# Patient Record
Sex: Male | Born: 1940 | Race: White | Hispanic: No | Marital: Married | Smoking: Former smoker
Health system: Southern US, Community
[De-identification: ages and names within clinical notes are randomized; demographics above are authoritative.]

## PROBLEM LIST (undated history)

## (undated) DIAGNOSIS — F329 Major depressive disorder, single episode, unspecified: Secondary | ICD-10-CM

## (undated) DIAGNOSIS — F419 Anxiety disorder, unspecified: Secondary | ICD-10-CM

## (undated) DIAGNOSIS — G309 Alzheimer's disease, unspecified: Secondary | ICD-10-CM

## (undated) DIAGNOSIS — C679 Malignant neoplasm of bladder, unspecified: Secondary | ICD-10-CM

## (undated) DIAGNOSIS — Z85048 Personal history of other malignant neoplasm of rectum, rectosigmoid junction, and anus: Secondary | ICD-10-CM

## (undated) DIAGNOSIS — K5190 Ulcerative colitis, unspecified, without complications: Secondary | ICD-10-CM

## (undated) DIAGNOSIS — T8859XA Other complications of anesthesia, initial encounter: Secondary | ICD-10-CM

## (undated) DIAGNOSIS — F0150 Vascular dementia without behavioral disturbance: Secondary | ICD-10-CM

## (undated) DIAGNOSIS — Z9289 Personal history of other medical treatment: Secondary | ICD-10-CM

## (undated) DIAGNOSIS — G4730 Sleep apnea, unspecified: Secondary | ICD-10-CM

## (undated) DIAGNOSIS — D649 Anemia, unspecified: Secondary | ICD-10-CM

## (undated) DIAGNOSIS — I5022 Chronic systolic (congestive) heart failure: Secondary | ICD-10-CM

## (undated) DIAGNOSIS — Z932 Ileostomy status: Secondary | ICD-10-CM

## (undated) DIAGNOSIS — N400 Enlarged prostate without lower urinary tract symptoms: Secondary | ICD-10-CM

## (undated) DIAGNOSIS — I480 Paroxysmal atrial fibrillation: Secondary | ICD-10-CM

## (undated) DIAGNOSIS — R296 Repeated falls: Secondary | ICD-10-CM

## (undated) DIAGNOSIS — N209 Urinary calculus, unspecified: Secondary | ICD-10-CM

## (undated) DIAGNOSIS — N183 Chronic kidney disease, stage 3 (moderate): Secondary | ICD-10-CM

## (undated) DIAGNOSIS — I2510 Atherosclerotic heart disease of native coronary artery without angina pectoris: Secondary | ICD-10-CM

## (undated) DIAGNOSIS — E1322 Other specified diabetes mellitus with diabetic chronic kidney disease: Secondary | ICD-10-CM

## (undated) DIAGNOSIS — I714 Abdominal aortic aneurysm, without rupture: Secondary | ICD-10-CM

## (undated) DIAGNOSIS — C189 Malignant neoplasm of colon, unspecified: Secondary | ICD-10-CM

## (undated) DIAGNOSIS — F0280 Dementia in other diseases classified elsewhere without behavioral disturbance: Secondary | ICD-10-CM

## (undated) DIAGNOSIS — K219 Gastro-esophageal reflux disease without esophagitis: Secondary | ICD-10-CM

## (undated) DIAGNOSIS — J42 Unspecified chronic bronchitis: Secondary | ICD-10-CM

## (undated) DIAGNOSIS — T4145XA Adverse effect of unspecified anesthetic, initial encounter: Secondary | ICD-10-CM

## (undated) DIAGNOSIS — Z8719 Personal history of other diseases of the digestive system: Secondary | ICD-10-CM

## (undated) DIAGNOSIS — K56609 Unspecified intestinal obstruction, unspecified as to partial versus complete obstruction: Secondary | ICD-10-CM

## (undated) DIAGNOSIS — E1365 Other specified diabetes mellitus with hyperglycemia: Secondary | ICD-10-CM

## (undated) DIAGNOSIS — E119 Type 2 diabetes mellitus without complications: Secondary | ICD-10-CM

## (undated) DIAGNOSIS — I639 Cerebral infarction, unspecified: Secondary | ICD-10-CM

## (undated) DIAGNOSIS — F1011 Alcohol abuse, in remission: Secondary | ICD-10-CM

## (undated) DIAGNOSIS — I10 Essential (primary) hypertension: Secondary | ICD-10-CM

## (undated) DIAGNOSIS — E669 Obesity, unspecified: Secondary | ICD-10-CM

## (undated) DIAGNOSIS — M1990 Unspecified osteoarthritis, unspecified site: Secondary | ICD-10-CM

## (undated) DIAGNOSIS — F32A Depression, unspecified: Secondary | ICD-10-CM

## (undated) DIAGNOSIS — J45909 Unspecified asthma, uncomplicated: Secondary | ICD-10-CM

## (undated) DIAGNOSIS — E7800 Pure hypercholesterolemia, unspecified: Secondary | ICD-10-CM

## (undated) HISTORY — DX: Essential (primary) hypertension: I10

## (undated) HISTORY — DX: Benign prostatic hyperplasia without lower urinary tract symptoms: N40.0

## (undated) HISTORY — PX: TRANSURETHRAL RESECTION OF BLADDER TUMOR WITH GYRUS (TURBT-GYRUS): SHX6458

## (undated) HISTORY — DX: Depression, unspecified: F32.A

## (undated) HISTORY — PX: JOINT REPLACEMENT: SHX530

## (undated) HISTORY — DX: Major depressive disorder, single episode, unspecified: F32.9

## (undated) HISTORY — PX: APPENDECTOMY: SHX54

## (undated) HISTORY — DX: Paroxysmal atrial fibrillation: I48.0

## (undated) HISTORY — DX: Ulcerative colitis, unspecified, without complications: K51.90

## (undated) HISTORY — DX: Personal history of other malignant neoplasm of rectum, rectosigmoid junction, and anus: Z85.048

## (undated) HISTORY — PX: FRACTURE SURGERY: SHX138

## (undated) HISTORY — DX: Obesity, unspecified: E66.9

## (undated) HISTORY — PX: EYE SURGERY: SHX253

## (undated) HISTORY — DX: Atherosclerotic heart disease of native coronary artery without angina pectoris: I25.10

## (undated) HISTORY — DX: Pure hypercholesterolemia, unspecified: E78.00

## (undated) HISTORY — PX: COLONOSCOPY W/ POLYPECTOMY: SHX1380

## (undated) HISTORY — DX: Anemia, unspecified: D64.9

## (undated) HISTORY — PX: CATARACT EXTRACTION W/ INTRAOCULAR LENS  IMPLANT, BILATERAL: SHX1307

---

## 1961-09-03 HISTORY — PX: CYSTOSCOPY W/ STONE MANIPULATION: SHX1427

## 1970-04-05 DIAGNOSIS — I639 Cerebral infarction, unspecified: Secondary | ICD-10-CM

## 1970-04-05 HISTORY — DX: Cerebral infarction, unspecified: I63.9

## 1990-04-05 DIAGNOSIS — I714 Abdominal aortic aneurysm, without rupture, unspecified: Secondary | ICD-10-CM

## 1990-04-05 HISTORY — DX: Abdominal aortic aneurysm, without rupture: I71.4

## 1990-04-05 HISTORY — DX: Abdominal aortic aneurysm, without rupture, unspecified: I71.40

## 1990-12-05 HISTORY — PX: CORONARY ANGIOPLASTY: SHX604

## 1991-01-03 HISTORY — PX: ABDOMINAL AORTIC ANEURYSM REPAIR: SHX42

## 1999-07-29 HISTORY — PX: KNEE ARTHROSCOPY: SHX127

## 2000-01-11 ENCOUNTER — Encounter: Payer: Self-pay | Admitting: Urology

## 2000-01-12 ENCOUNTER — Encounter (INDEPENDENT_AMBULATORY_CARE_PROVIDER_SITE_OTHER): Payer: Self-pay | Admitting: Specialist

## 2000-01-12 ENCOUNTER — Encounter: Payer: Self-pay | Admitting: Urology

## 2000-01-12 ENCOUNTER — Observation Stay (HOSPITAL_COMMUNITY): Admission: RE | Admit: 2000-01-12 | Discharge: 2000-01-13 | Payer: Self-pay | Admitting: Urology

## 2000-05-06 DIAGNOSIS — C679 Malignant neoplasm of bladder, unspecified: Secondary | ICD-10-CM

## 2000-05-06 HISTORY — DX: Malignant neoplasm of bladder, unspecified: C67.9

## 2003-03-04 ENCOUNTER — Ambulatory Visit (HOSPITAL_COMMUNITY): Admission: RE | Admit: 2003-03-04 | Discharge: 2003-03-04 | Payer: Self-pay | Admitting: Oral Surgery

## 2003-08-17 ENCOUNTER — Inpatient Hospital Stay (HOSPITAL_COMMUNITY): Admission: EM | Admit: 2003-08-17 | Discharge: 2003-08-23 | Payer: Self-pay | Admitting: Emergency Medicine

## 2003-08-17 HISTORY — PX: ANKLE FRACTURE SURGERY: SHX122

## 2005-04-05 DIAGNOSIS — C189 Malignant neoplasm of colon, unspecified: Secondary | ICD-10-CM

## 2005-04-05 DIAGNOSIS — Z9289 Personal history of other medical treatment: Secondary | ICD-10-CM

## 2005-04-05 HISTORY — DX: Malignant neoplasm of colon, unspecified: C18.9

## 2005-04-05 HISTORY — DX: Personal history of other medical treatment: Z92.89

## 2005-09-20 ENCOUNTER — Ambulatory Visit: Payer: Self-pay | Admitting: Gastroenterology

## 2005-09-21 ENCOUNTER — Ambulatory Visit: Payer: Self-pay | Admitting: Gastroenterology

## 2005-09-21 ENCOUNTER — Encounter (INDEPENDENT_AMBULATORY_CARE_PROVIDER_SITE_OTHER): Payer: Self-pay | Admitting: Specialist

## 2005-09-22 ENCOUNTER — Ambulatory Visit (HOSPITAL_COMMUNITY): Admission: RE | Admit: 2005-09-22 | Discharge: 2005-09-22 | Payer: Self-pay | Admitting: Gastroenterology

## 2005-09-22 ENCOUNTER — Ambulatory Visit: Payer: Self-pay | Admitting: Oncology

## 2005-09-23 ENCOUNTER — Ambulatory Visit: Payer: Self-pay | Admitting: Cardiology

## 2005-09-27 ENCOUNTER — Ambulatory Visit: Payer: Self-pay | Admitting: Gastroenterology

## 2005-10-08 ENCOUNTER — Ambulatory Visit: Admission: RE | Admit: 2005-10-08 | Discharge: 2005-12-12 | Payer: Self-pay | Admitting: *Deleted

## 2005-10-08 LAB — COMPREHENSIVE METABOLIC PANEL
ALT: 27 U/L (ref 0–40)
CO2: 27 mEq/L (ref 19–32)
Calcium: 9.7 mg/dL (ref 8.4–10.5)
Chloride: 94 mEq/L — ABNORMAL LOW (ref 96–112)
Sodium: 136 mEq/L (ref 135–145)
Total Protein: 7.3 g/dL (ref 6.0–8.3)

## 2005-10-08 LAB — CBC WITH DIFFERENTIAL/PLATELET
BASO%: 0.2 % (ref 0.0–2.0)
MCHC: 34.8 g/dL (ref 32.0–35.9)
MONO#: 0.8 10*3/uL (ref 0.1–0.9)
RBC: 3.93 10*6/uL — ABNORMAL LOW (ref 4.20–5.71)
RDW: 13.2 % (ref 11.2–14.6)
WBC: 10.2 10*3/uL — ABNORMAL HIGH (ref 4.0–10.0)
lymph#: 1.1 10*3/uL (ref 0.9–3.3)

## 2005-10-08 LAB — CEA: CEA: 1.6 ng/mL (ref 0.0–5.0)

## 2005-10-19 ENCOUNTER — Ambulatory Visit: Payer: Self-pay | Admitting: Gastroenterology

## 2005-10-20 ENCOUNTER — Ambulatory Visit (HOSPITAL_COMMUNITY): Admission: RE | Admit: 2005-10-20 | Discharge: 2005-10-20 | Payer: Self-pay | Admitting: Gastroenterology

## 2005-10-26 LAB — CBC WITH DIFFERENTIAL/PLATELET
BASO%: 0.2 % (ref 0.0–2.0)
EOS%: 0.5 % (ref 0.0–7.0)
HCT: 38.2 % — ABNORMAL LOW (ref 38.7–49.9)
MCH: 33.5 pg — ABNORMAL HIGH (ref 28.0–33.4)
MCHC: 34.8 g/dL (ref 32.0–35.9)
MONO%: 4.8 % (ref 0.0–13.0)
NEUT%: 86.3 % — ABNORMAL HIGH (ref 40.0–75.0)
RDW: 12.5 % (ref 11.2–14.6)
lymph#: 0.9 10*3/uL (ref 0.9–3.3)

## 2005-10-26 LAB — COMPREHENSIVE METABOLIC PANEL
ALT: 23 U/L (ref 0–40)
AST: 17 U/L (ref 0–37)
Alkaline Phosphatase: 60 U/L (ref 39–117)
Calcium: 10 mg/dL (ref 8.4–10.5)
Chloride: 98 mEq/L (ref 96–112)
Creatinine, Ser: 1.3 mg/dL (ref 0.40–1.50)
Total Bilirubin: 0.8 mg/dL (ref 0.3–1.2)

## 2005-11-02 ENCOUNTER — Ambulatory Visit: Payer: Self-pay | Admitting: Gastroenterology

## 2005-11-02 LAB — COMPREHENSIVE METABOLIC PANEL
ALT: 18 U/L (ref 0–40)
AST: 14 U/L (ref 0–37)
BUN: 19 mg/dL (ref 6–23)
Creatinine, Ser: 1.12 mg/dL (ref 0.40–1.50)
Total Bilirubin: 0.9 mg/dL (ref 0.3–1.2)

## 2005-11-02 LAB — CBC WITH DIFFERENTIAL/PLATELET
BASO%: 0.3 % (ref 0.0–2.0)
Basophils Absolute: 0 10*3/uL (ref 0.0–0.1)
EOS%: 1.1 % (ref 0.0–7.0)
HCT: 31 % — ABNORMAL LOW (ref 38.7–49.9)
LYMPH%: 20.4 % (ref 14.0–48.0)
MCH: 33.6 pg — ABNORMAL HIGH (ref 28.0–33.4)
MCHC: 35.2 g/dL (ref 32.0–35.9)
MCV: 95.3 fL (ref 81.6–98.0)
MONO%: 8.1 % (ref 0.0–13.0)
NEUT%: 70.1 % (ref 40.0–75.0)
Platelets: 242 10*3/uL (ref 145–400)

## 2005-11-09 ENCOUNTER — Ambulatory Visit: Payer: Self-pay | Admitting: Oncology

## 2005-11-09 LAB — CBC WITH DIFFERENTIAL/PLATELET
Basophils Absolute: 0 10*3/uL (ref 0.0–0.1)
EOS%: 3 % (ref 0.0–7.0)
HCT: 31.9 % — ABNORMAL LOW (ref 38.7–49.9)
HGB: 11.1 g/dL — ABNORMAL LOW (ref 13.0–17.1)
MCH: 33.8 pg — ABNORMAL HIGH (ref 28.0–33.4)
MONO#: 0.5 10*3/uL (ref 0.1–0.9)
NEUT%: 65.2 % (ref 40.0–75.0)
Platelets: 229 10*3/uL (ref 145–400)
lymph#: 1 10*3/uL (ref 0.9–3.3)

## 2005-11-16 LAB — CBC WITH DIFFERENTIAL/PLATELET
BASO%: 0.5 % (ref 0.0–2.0)
Eosinophils Absolute: 0.2 10*3/uL (ref 0.0–0.5)
HCT: 29.7 % — ABNORMAL LOW (ref 38.7–49.9)
HGB: 10.4 g/dL — ABNORMAL LOW (ref 13.0–17.1)
LYMPH%: 14.9 % (ref 14.0–48.0)
MCH: 34 pg — ABNORMAL HIGH (ref 28.0–33.4)
MCV: 97.2 fL (ref 81.6–98.0)
MONO#: 0.6 10*3/uL (ref 0.1–0.9)
lymph#: 0.8 10*3/uL — ABNORMAL LOW (ref 0.9–3.3)

## 2005-11-18 LAB — CBC WITH DIFFERENTIAL/PLATELET
BASO%: 0.4 % (ref 0.0–2.0)
Eosinophils Absolute: 0.2 10*3/uL (ref 0.0–0.5)
HCT: 29.4 % — ABNORMAL LOW (ref 38.7–49.9)
HGB: 10.4 g/dL — ABNORMAL LOW (ref 13.0–17.1)
LYMPH%: 15.9 % (ref 14.0–48.0)
MCHC: 35.2 g/dL (ref 32.0–35.9)
MONO#: 0.5 10*3/uL (ref 0.1–0.9)
NEUT#: 2.9 10*3/uL (ref 1.5–6.5)
NEUT%: 67.7 % (ref 40.0–75.0)
Platelets: 242 10*3/uL (ref 145–400)
WBC: 4.3 10*3/uL (ref 4.0–10.0)
lymph#: 0.7 10*3/uL — ABNORMAL LOW (ref 0.9–3.3)

## 2005-11-18 LAB — COMPREHENSIVE METABOLIC PANEL
ALT: 17 U/L (ref 0–40)
CO2: 30 mEq/L (ref 19–32)
Calcium: 10.1 mg/dL (ref 8.4–10.5)
Chloride: 98 mEq/L (ref 96–112)
Creatinine, Ser: 1.15 mg/dL (ref 0.40–1.50)
Glucose, Bld: 224 mg/dL — ABNORMAL HIGH (ref 70–99)
Total Bilirubin: 0.8 mg/dL (ref 0.3–1.2)
Total Protein: 6.9 g/dL (ref 6.0–8.3)

## 2005-11-23 LAB — COMPREHENSIVE METABOLIC PANEL
ALT: 15 U/L (ref 0–40)
Albumin: 4.4 g/dL (ref 3.5–5.2)
CO2: 27 mEq/L (ref 19–32)
Calcium: 9.8 mg/dL (ref 8.4–10.5)
Chloride: 99 mEq/L (ref 96–112)
Glucose, Bld: 240 mg/dL — ABNORMAL HIGH (ref 70–99)
Sodium: 136 mEq/L (ref 135–145)
Total Protein: 6.7 g/dL (ref 6.0–8.3)

## 2005-11-23 LAB — CBC WITH DIFFERENTIAL/PLATELET
BASO%: 0.3 % (ref 0.0–2.0)
Eosinophils Absolute: 0.2 10*3/uL (ref 0.0–0.5)
HCT: 28.5 % — ABNORMAL LOW (ref 38.7–49.9)
MCHC: 35.4 g/dL (ref 32.0–35.9)
MONO#: 0.4 10*3/uL (ref 0.1–0.9)
NEUT#: 2.8 10*3/uL (ref 1.5–6.5)
RBC: 2.87 10*6/uL — ABNORMAL LOW (ref 4.20–5.71)
WBC: 4 10*3/uL (ref 4.0–10.0)
lymph#: 0.6 10*3/uL — ABNORMAL LOW (ref 0.9–3.3)

## 2005-11-30 LAB — CBC WITH DIFFERENTIAL/PLATELET
BASO%: 0.3 % (ref 0.0–2.0)
LYMPH%: 14.1 % (ref 14.0–48.0)
MCHC: 34.6 g/dL (ref 32.0–35.9)
MONO#: 0.5 10*3/uL (ref 0.1–0.9)
RBC: 2.83 10*6/uL — ABNORMAL LOW (ref 4.20–5.71)
RDW: 20.4 % — ABNORMAL HIGH (ref 11.2–14.6)
WBC: 4.1 10*3/uL (ref 4.0–10.0)
lymph#: 0.6 10*3/uL — ABNORMAL LOW (ref 0.9–3.3)

## 2005-12-03 ENCOUNTER — Ambulatory Visit: Payer: Self-pay | Admitting: Gastroenterology

## 2005-12-22 ENCOUNTER — Ambulatory Visit: Payer: Self-pay | Admitting: Oncology

## 2005-12-22 LAB — CBC WITH DIFFERENTIAL/PLATELET
BASO%: 0.7 % (ref 0.0–2.0)
HCT: 33.5 % — ABNORMAL LOW (ref 38.7–49.9)
MCHC: 34.8 g/dL (ref 32.0–35.9)
MONO#: 0.5 10*3/uL (ref 0.1–0.9)
NEUT#: 2.7 10*3/uL (ref 1.5–6.5)
NEUT%: 60.3 % (ref 40.0–75.0)
WBC: 4.4 10*3/uL (ref 4.0–10.0)
lymph#: 0.9 10*3/uL (ref 0.9–3.3)

## 2005-12-22 LAB — COMPREHENSIVE METABOLIC PANEL
ALT: 17 U/L (ref 0–40)
CO2: 30 mEq/L (ref 19–32)
Calcium: 9.9 mg/dL (ref 8.4–10.5)
Chloride: 99 mEq/L (ref 96–112)
Creatinine, Ser: 0.97 mg/dL (ref 0.40–1.50)
Sodium: 138 mEq/L (ref 135–145)
Total Protein: 6.9 g/dL (ref 6.0–8.3)

## 2006-01-05 ENCOUNTER — Inpatient Hospital Stay (HOSPITAL_COMMUNITY): Admission: RE | Admit: 2006-01-05 | Discharge: 2006-01-31 | Payer: Self-pay | Admitting: General Surgery

## 2006-01-06 ENCOUNTER — Encounter (INDEPENDENT_AMBULATORY_CARE_PROVIDER_SITE_OTHER): Payer: Self-pay | Admitting: Specialist

## 2006-01-06 DIAGNOSIS — Z932 Ileostomy status: Secondary | ICD-10-CM

## 2006-01-06 HISTORY — PX: COLECTOMY: SHX59

## 2006-01-06 HISTORY — DX: Ileostomy status: Z93.2

## 2006-02-07 ENCOUNTER — Inpatient Hospital Stay (HOSPITAL_COMMUNITY): Admission: AD | Admit: 2006-02-07 | Discharge: 2006-03-02 | Payer: Self-pay | Admitting: General Surgery

## 2006-02-08 ENCOUNTER — Ambulatory Visit: Payer: Self-pay | Admitting: Oncology

## 2006-02-11 ENCOUNTER — Ambulatory Visit: Payer: Self-pay | Admitting: Physical Medicine & Rehabilitation

## 2006-02-11 ENCOUNTER — Encounter: Payer: Self-pay | Admitting: Cardiology

## 2006-02-11 ENCOUNTER — Ambulatory Visit: Payer: Self-pay | Admitting: Cardiology

## 2006-02-14 ENCOUNTER — Encounter (INDEPENDENT_AMBULATORY_CARE_PROVIDER_SITE_OTHER): Payer: Self-pay | Admitting: Neurology

## 2006-02-22 ENCOUNTER — Ambulatory Visit: Payer: Self-pay | Admitting: Infectious Diseases

## 2006-03-11 ENCOUNTER — Inpatient Hospital Stay (HOSPITAL_COMMUNITY): Admission: EM | Admit: 2006-03-11 | Discharge: 2006-03-25 | Payer: Self-pay | Admitting: Emergency Medicine

## 2006-04-07 ENCOUNTER — Encounter: Admission: RE | Admit: 2006-04-07 | Discharge: 2006-04-07 | Payer: Self-pay | Admitting: General Surgery

## 2006-04-22 ENCOUNTER — Encounter (INDEPENDENT_AMBULATORY_CARE_PROVIDER_SITE_OTHER): Payer: Self-pay | Admitting: Specialist

## 2006-04-22 ENCOUNTER — Inpatient Hospital Stay (HOSPITAL_COMMUNITY): Admission: RE | Admit: 2006-04-22 | Discharge: 2006-04-24 | Payer: Self-pay | Admitting: Urology

## 2006-04-22 HISTORY — PX: TRANSURETHRAL RESECTION OF PROSTATE: SHX73

## 2006-05-02 ENCOUNTER — Ambulatory Visit: Payer: Self-pay | Admitting: Infectious Diseases

## 2006-05-25 DIAGNOSIS — K5190 Ulcerative colitis, unspecified, without complications: Secondary | ICD-10-CM | POA: Insufficient documentation

## 2006-05-25 DIAGNOSIS — Z85048 Personal history of other malignant neoplasm of rectum, rectosigmoid junction, and anus: Secondary | ICD-10-CM

## 2006-05-25 DIAGNOSIS — N400 Enlarged prostate without lower urinary tract symptoms: Secondary | ICD-10-CM | POA: Insufficient documentation

## 2006-05-25 DIAGNOSIS — R32 Unspecified urinary incontinence: Secondary | ICD-10-CM | POA: Insufficient documentation

## 2006-06-01 ENCOUNTER — Encounter (INDEPENDENT_AMBULATORY_CARE_PROVIDER_SITE_OTHER): Payer: Self-pay | Admitting: Infectious Diseases

## 2006-06-03 ENCOUNTER — Ambulatory Visit: Payer: Self-pay | Admitting: Oncology

## 2006-06-08 LAB — COMPREHENSIVE METABOLIC PANEL
ALT: 17 U/L (ref 0–53)
BUN: 21 mg/dL (ref 6–23)
CO2: 24 mEq/L (ref 19–32)
Calcium: 10.4 mg/dL (ref 8.4–10.5)
Chloride: 106 mEq/L (ref 96–112)
Creatinine, Ser: 1.06 mg/dL (ref 0.40–1.50)
Total Bilirubin: 0.4 mg/dL (ref 0.3–1.2)

## 2006-06-08 LAB — CBC WITH DIFFERENTIAL/PLATELET
BASO%: 0.6 % (ref 0.0–2.0)
Basophils Absolute: 0 10*3/uL (ref 0.0–0.1)
EOS%: 8.7 % — ABNORMAL HIGH (ref 0.0–7.0)
HCT: 34.5 % — ABNORMAL LOW (ref 38.7–49.9)
HGB: 12.1 g/dL — ABNORMAL LOW (ref 13.0–17.1)
LYMPH%: 21.6 % (ref 14.0–48.0)
MCH: 30.4 pg (ref 28.0–33.4)
MCHC: 35.2 g/dL (ref 32.0–35.9)
MONO#: 0.5 10*3/uL (ref 0.1–0.9)
NEUT%: 61.5 % (ref 40.0–75.0)
Platelets: 244 10*3/uL (ref 145–400)
lymph#: 1.3 10*3/uL (ref 0.9–3.3)

## 2006-06-13 ENCOUNTER — Ambulatory Visit (HOSPITAL_COMMUNITY): Admission: RE | Admit: 2006-06-13 | Discharge: 2006-06-13 | Payer: Self-pay | Admitting: Oncology

## 2006-09-05 ENCOUNTER — Ambulatory Visit: Payer: Self-pay | Admitting: Oncology

## 2006-09-07 LAB — CBC WITH DIFFERENTIAL/PLATELET
BASO%: 0.5 % (ref 0.0–2.0)
Basophils Absolute: 0 10*3/uL (ref 0.0–0.1)
Eosinophils Absolute: 0.5 10*3/uL (ref 0.0–0.5)
HCT: 35 % — ABNORMAL LOW (ref 38.7–49.9)
HGB: 12.5 g/dL — ABNORMAL LOW (ref 13.0–17.1)
LYMPH%: 29.5 % (ref 14.0–48.0)
MONO#: 0.4 10*3/uL (ref 0.1–0.9)
NEUT%: 53 % (ref 40.0–75.0)
Platelets: 219 10*3/uL (ref 145–400)
WBC: 5.5 10*3/uL (ref 4.0–10.0)
lymph#: 1.6 10*3/uL (ref 0.9–3.3)

## 2006-09-07 LAB — COMPREHENSIVE METABOLIC PANEL
ALT: 22 U/L (ref 0–53)
BUN: 17 mg/dL (ref 6–23)
CO2: 25 mEq/L (ref 19–32)
Calcium: 10.2 mg/dL (ref 8.4–10.5)
Chloride: 103 mEq/L (ref 96–112)
Creatinine, Ser: 1.31 mg/dL (ref 0.40–1.50)
Glucose, Bld: 173 mg/dL — ABNORMAL HIGH (ref 70–99)
Total Bilirubin: 0.6 mg/dL (ref 0.3–1.2)

## 2006-09-07 LAB — CEA: CEA: 0.7 ng/mL (ref 0.0–5.0)

## 2006-11-23 ENCOUNTER — Ambulatory Visit: Payer: Self-pay | Admitting: Gastroenterology

## 2006-12-08 ENCOUNTER — Ambulatory Visit (HOSPITAL_COMMUNITY): Admission: RE | Admit: 2006-12-08 | Discharge: 2006-12-08 | Payer: Self-pay | Admitting: Oncology

## 2006-12-13 ENCOUNTER — Ambulatory Visit: Payer: Self-pay | Admitting: Oncology

## 2006-12-15 LAB — CBC WITH DIFFERENTIAL/PLATELET
BASO%: 0.5 % (ref 0.0–2.0)
Basophils Absolute: 0 10*3/uL (ref 0.0–0.1)
EOS%: 5.3 % (ref 0.0–7.0)
HGB: 12.7 g/dL — ABNORMAL LOW (ref 13.0–17.1)
MCH: 33.1 pg (ref 28.0–33.4)
MONO#: 0.4 10*3/uL (ref 0.1–0.9)
RDW: 14.9 % — ABNORMAL HIGH (ref 11.2–14.6)
WBC: 5.6 10*3/uL (ref 4.0–10.0)
lymph#: 1.2 10*3/uL (ref 0.9–3.3)

## 2006-12-15 LAB — COMPREHENSIVE METABOLIC PANEL
ALT: 21 U/L (ref 0–53)
AST: 20 U/L (ref 0–37)
Albumin: 4.8 g/dL (ref 3.5–5.2)
BUN: 18 mg/dL (ref 6–23)
CO2: 23 mEq/L (ref 19–32)
Calcium: 10 mg/dL (ref 8.4–10.5)
Chloride: 105 mEq/L (ref 96–112)
Potassium: 5 mEq/L (ref 3.5–5.3)

## 2006-12-15 LAB — CEA: CEA: 1.3 ng/mL (ref 0.0–5.0)

## 2007-03-14 ENCOUNTER — Ambulatory Visit: Payer: Self-pay | Admitting: Oncology

## 2007-03-16 LAB — CBC WITH DIFFERENTIAL/PLATELET
BASO%: 0.2 % (ref 0.0–2.0)
EOS%: 4.7 % (ref 0.0–7.0)
MCH: 32.7 pg (ref 28.0–33.4)
MCV: 91.3 fL (ref 81.6–98.0)
MONO%: 6.1 % (ref 0.0–13.0)
RBC: 4.27 10*6/uL (ref 4.20–5.71)
RDW: 13.1 % (ref 11.2–14.6)

## 2007-03-16 LAB — COMPREHENSIVE METABOLIC PANEL
ALT: 26 U/L (ref 0–53)
AST: 22 U/L (ref 0–37)
BUN: 21 mg/dL (ref 6–23)
CO2: 21 mEq/L (ref 19–32)
Creatinine, Ser: 1.38 mg/dL (ref 0.40–1.50)
Total Bilirubin: 0.7 mg/dL (ref 0.3–1.2)

## 2007-03-16 LAB — CEA: CEA: 0.6 ng/mL (ref 0.0–5.0)

## 2007-04-22 ENCOUNTER — Encounter: Admission: RE | Admit: 2007-04-22 | Discharge: 2007-04-22 | Payer: Self-pay | Admitting: Family Medicine

## 2007-05-29 ENCOUNTER — Encounter (INDEPENDENT_AMBULATORY_CARE_PROVIDER_SITE_OTHER): Payer: Self-pay | Admitting: *Deleted

## 2007-05-29 ENCOUNTER — Ambulatory Visit: Payer: Self-pay

## 2007-06-14 ENCOUNTER — Ambulatory Visit: Payer: Self-pay | Admitting: Oncology

## 2007-06-14 LAB — CBC WITH DIFFERENTIAL/PLATELET
Basophils Absolute: 0 10*3/uL (ref 0.0–0.1)
EOS%: 6.4 % (ref 0.0–7.0)
Eosinophils Absolute: 0.3 10*3/uL (ref 0.0–0.5)
HCT: 35.2 % — ABNORMAL LOW (ref 38.7–49.9)
HGB: 12.6 g/dL — ABNORMAL LOW (ref 13.0–17.1)
MCH: 32.9 pg (ref 28.0–33.4)
MONO#: 0.3 10*3/uL (ref 0.1–0.9)
NEUT#: 3.2 10*3/uL (ref 1.5–6.5)
NEUT%: 59.7 % (ref 40.0–75.0)
lymph#: 1.5 10*3/uL (ref 0.9–3.3)

## 2007-06-14 LAB — COMPREHENSIVE METABOLIC PANEL
Albumin: 4.9 g/dL (ref 3.5–5.2)
BUN: 18 mg/dL (ref 6–23)
CO2: 23 mEq/L (ref 19–32)
Calcium: 9.7 mg/dL (ref 8.4–10.5)
Chloride: 101 mEq/L (ref 96–112)
Creatinine, Ser: 1.37 mg/dL (ref 0.40–1.50)
Glucose, Bld: 279 mg/dL — ABNORMAL HIGH (ref 70–99)

## 2007-06-14 LAB — CEA: CEA: 1.1 ng/mL (ref 0.0–5.0)

## 2007-06-19 ENCOUNTER — Ambulatory Visit (HOSPITAL_COMMUNITY): Admission: RE | Admit: 2007-06-19 | Discharge: 2007-06-19 | Payer: Self-pay | Admitting: Oncology

## 2007-08-11 ENCOUNTER — Encounter: Payer: Self-pay | Admitting: Internal Medicine

## 2007-08-11 ENCOUNTER — Ambulatory Visit: Payer: Self-pay | Admitting: Cardiology

## 2007-09-18 ENCOUNTER — Ambulatory Visit: Payer: Self-pay | Admitting: Cardiology

## 2007-09-20 ENCOUNTER — Ambulatory Visit: Payer: Self-pay | Admitting: Oncology

## 2007-09-22 LAB — COMPREHENSIVE METABOLIC PANEL
ALT: 25 U/L (ref 0–53)
CO2: 25 mEq/L (ref 19–32)
Creatinine, Ser: 1.65 mg/dL — ABNORMAL HIGH (ref 0.40–1.50)
Total Bilirubin: 0.8 mg/dL (ref 0.3–1.2)

## 2007-09-22 LAB — CEA: CEA: 1.2 ng/mL (ref 0.0–5.0)

## 2007-09-22 LAB — CBC WITH DIFFERENTIAL/PLATELET
BASO%: 0.3 % (ref 0.0–2.0)
EOS%: 5.7 % (ref 0.0–7.0)
HCT: 33 % — ABNORMAL LOW (ref 38.7–49.9)
LYMPH%: 23.7 % (ref 14.0–48.0)
MCH: 32.6 pg (ref 28.0–33.4)
MCHC: 35.3 g/dL (ref 32.0–35.9)
NEUT%: 63.2 % (ref 40.0–75.0)
Platelets: 239 10*3/uL (ref 145–400)

## 2007-10-23 ENCOUNTER — Ambulatory Visit: Payer: Self-pay | Admitting: Internal Medicine

## 2007-11-06 ENCOUNTER — Ambulatory Visit: Payer: Self-pay | Admitting: Internal Medicine

## 2007-11-06 ENCOUNTER — Ambulatory Visit (HOSPITAL_COMMUNITY): Admission: RE | Admit: 2007-11-06 | Discharge: 2007-11-06 | Payer: Self-pay | Admitting: Internal Medicine

## 2007-11-30 ENCOUNTER — Ambulatory Visit: Payer: Self-pay

## 2007-12-13 ENCOUNTER — Ambulatory Visit: Payer: Self-pay | Admitting: Oncology

## 2007-12-18 ENCOUNTER — Ambulatory Visit (HOSPITAL_COMMUNITY): Admission: RE | Admit: 2007-12-18 | Discharge: 2007-12-18 | Payer: Self-pay | Admitting: Oncology

## 2007-12-18 LAB — CBC WITH DIFFERENTIAL/PLATELET
Basophils Absolute: 0 10*3/uL (ref 0.0–0.1)
Eosinophils Absolute: 0.4 10*3/uL (ref 0.0–0.5)
HCT: 36 % — ABNORMAL LOW (ref 38.7–49.9)
HGB: 12.7 g/dL — ABNORMAL LOW (ref 13.0–17.1)
LYMPH%: 26.1 % (ref 14.0–48.0)
MCV: 92.8 fL (ref 81.6–98.0)
MONO%: 7.1 % (ref 0.0–13.0)
NEUT#: 3.1 10*3/uL (ref 1.5–6.5)
NEUT%: 59.2 % (ref 40.0–75.0)
Platelets: 213 10*3/uL (ref 145–400)

## 2007-12-18 LAB — COMPREHENSIVE METABOLIC PANEL
Albumin: 3.9 g/dL (ref 3.5–5.2)
Alkaline Phosphatase: 41 U/L (ref 39–117)
BUN: 16 mg/dL (ref 6–23)
Creatinine, Ser: 1.37 mg/dL (ref 0.40–1.50)
Glucose, Bld: 314 mg/dL — ABNORMAL HIGH (ref 70–99)
Potassium: 4.8 mEq/L (ref 3.5–5.3)
Total Bilirubin: 0.8 mg/dL (ref 0.3–1.2)

## 2007-12-18 LAB — CEA: CEA: 1.2 ng/mL (ref 0.0–5.0)

## 2007-12-19 ENCOUNTER — Ambulatory Visit: Payer: Self-pay | Admitting: Internal Medicine

## 2008-01-07 HISTORY — PX: TOTAL KNEE ARTHROPLASTY: SHX125

## 2008-01-12 ENCOUNTER — Ambulatory Visit: Payer: Self-pay

## 2008-02-26 ENCOUNTER — Ambulatory Visit: Payer: Self-pay | Admitting: Internal Medicine

## 2008-03-20 ENCOUNTER — Ambulatory Visit: Payer: Self-pay | Admitting: Oncology

## 2008-04-04 LAB — COMPREHENSIVE METABOLIC PANEL
ALT: 28 U/L (ref 0–53)
AST: 26 U/L (ref 0–37)
Creatinine, Ser: 2.41 mg/dL — ABNORMAL HIGH (ref 0.40–1.50)
Sodium: 129 mEq/L — ABNORMAL LOW (ref 135–145)
Total Bilirubin: 0.4 mg/dL (ref 0.3–1.2)
Total Protein: 7.1 g/dL (ref 6.0–8.3)

## 2008-04-04 LAB — CBC WITH DIFFERENTIAL/PLATELET
BASO%: 0 % (ref 0.0–2.0)
EOS%: 0 % (ref 0.0–7.0)
HCT: 34 % — ABNORMAL LOW (ref 38.7–49.9)
LYMPH%: 8.5 % — ABNORMAL LOW (ref 14.0–48.0)
MCH: 31.5 pg (ref 28.0–33.4)
MCHC: 34.6 g/dL (ref 32.0–35.9)
NEUT%: 88.6 % — ABNORMAL HIGH (ref 40.0–75.0)
Platelets: 460 10*3/uL — ABNORMAL HIGH (ref 145–400)
RBC: 3.75 10*6/uL — ABNORMAL LOW (ref 4.20–5.71)
lymph#: 1.3 10*3/uL (ref 0.9–3.3)

## 2008-04-05 ENCOUNTER — Inpatient Hospital Stay (HOSPITAL_COMMUNITY): Admission: EM | Admit: 2008-04-05 | Discharge: 2008-04-08 | Payer: Self-pay | Admitting: Emergency Medicine

## 2008-06-28 ENCOUNTER — Ambulatory Visit: Payer: Self-pay | Admitting: Oncology

## 2008-07-02 ENCOUNTER — Ambulatory Visit (HOSPITAL_COMMUNITY): Admission: RE | Admit: 2008-07-02 | Discharge: 2008-07-02 | Payer: Self-pay | Admitting: Oncology

## 2008-07-02 LAB — CBC WITH DIFFERENTIAL/PLATELET
Basophils Absolute: 0.1 10*3/uL (ref 0.0–0.1)
Eosinophils Absolute: 0.4 10*3/uL (ref 0.0–0.5)
HGB: 14.1 g/dL (ref 13.0–17.1)
MONO#: 0.5 10*3/uL (ref 0.1–0.9)
MONO%: 8.9 % (ref 0.0–14.0)
NEUT#: 3.1 10*3/uL (ref 1.5–6.5)
RBC: 4.7 10*6/uL (ref 4.20–5.82)
RDW: 13.9 % (ref 11.0–14.6)
WBC: 5.7 10*3/uL (ref 4.0–10.3)
lymph#: 1.8 10*3/uL (ref 0.9–3.3)
nRBC: 0 % (ref 0–0)

## 2008-07-02 LAB — COMPREHENSIVE METABOLIC PANEL
ALT: 24 U/L (ref 0–53)
Albumin: 4.9 g/dL (ref 3.5–5.2)
CO2: 26 mEq/L (ref 19–32)
Glucose, Bld: 157 mg/dL — ABNORMAL HIGH (ref 70–99)
Potassium: 5 mEq/L (ref 3.5–5.3)
Sodium: 137 mEq/L (ref 135–145)
Total Bilirubin: 0.5 mg/dL (ref 0.3–1.2)
Total Protein: 7.4 g/dL (ref 6.0–8.3)

## 2008-07-02 LAB — CEA: CEA: 0.6 ng/mL (ref 0.0–5.0)

## 2008-07-16 DIAGNOSIS — D649 Anemia, unspecified: Secondary | ICD-10-CM

## 2008-07-16 DIAGNOSIS — E7800 Pure hypercholesterolemia, unspecified: Secondary | ICD-10-CM

## 2008-07-16 DIAGNOSIS — F329 Major depressive disorder, single episode, unspecified: Secondary | ICD-10-CM

## 2008-07-16 DIAGNOSIS — I10 Essential (primary) hypertension: Secondary | ICD-10-CM

## 2008-07-16 DIAGNOSIS — M109 Gout, unspecified: Secondary | ICD-10-CM

## 2008-07-18 ENCOUNTER — Ambulatory Visit: Payer: Self-pay | Admitting: Internal Medicine

## 2008-07-18 ENCOUNTER — Encounter (INDEPENDENT_AMBULATORY_CARE_PROVIDER_SITE_OTHER): Payer: Self-pay | Admitting: *Deleted

## 2008-07-18 ENCOUNTER — Encounter: Payer: Self-pay | Admitting: Internal Medicine

## 2008-07-18 DIAGNOSIS — R55 Syncope and collapse: Secondary | ICD-10-CM

## 2008-07-18 LAB — CONVERTED CEMR LAB
BUN: 23 mg/dL (ref 6–23)
Basophils Absolute: 0.1 10*3/uL (ref 0.0–0.1)
CO2: 28 meq/L (ref 19–32)
Chloride: 101 meq/L (ref 96–112)
Creatinine, Ser: 1.5 mg/dL (ref 0.4–1.5)
Eosinophils Absolute: 0.4 10*3/uL (ref 0.0–0.7)
Eosinophils Relative: 7.1 % — ABNORMAL HIGH (ref 0.0–5.0)
Glucose, Bld: 279 mg/dL — ABNORMAL HIGH (ref 70–99)
HCT: 35.3 % — ABNORMAL LOW (ref 39.0–52.0)
Lymphs Abs: 1.5 10*3/uL (ref 0.7–4.0)
MCHC: 35.2 g/dL (ref 30.0–36.0)
MCV: 90.2 fL (ref 78.0–100.0)
Monocytes Absolute: 0.5 10*3/uL (ref 0.1–1.0)
Platelets: 228 10*3/uL (ref 150.0–400.0)
Potassium: 4.3 meq/L (ref 3.5–5.1)
Prothrombin Time: 10.7 s — ABNORMAL LOW (ref 10.9–13.3)
RDW: 12.8 % (ref 11.5–14.6)

## 2008-07-23 ENCOUNTER — Ambulatory Visit: Payer: Self-pay | Admitting: Internal Medicine

## 2008-07-23 ENCOUNTER — Ambulatory Visit (HOSPITAL_COMMUNITY): Admission: RE | Admit: 2008-07-23 | Discharge: 2008-07-23 | Payer: Self-pay | Admitting: Internal Medicine

## 2008-07-30 ENCOUNTER — Ambulatory Visit (HOSPITAL_COMMUNITY): Admission: RE | Admit: 2008-07-30 | Discharge: 2008-07-30 | Payer: Self-pay | Admitting: Oncology

## 2008-08-23 ENCOUNTER — Encounter (INDEPENDENT_AMBULATORY_CARE_PROVIDER_SITE_OTHER): Payer: Self-pay | Admitting: *Deleted

## 2008-08-27 ENCOUNTER — Encounter (INDEPENDENT_AMBULATORY_CARE_PROVIDER_SITE_OTHER): Payer: Self-pay | Admitting: *Deleted

## 2008-10-21 ENCOUNTER — Ambulatory Visit: Payer: Self-pay | Admitting: Oncology

## 2008-10-25 ENCOUNTER — Encounter (INDEPENDENT_AMBULATORY_CARE_PROVIDER_SITE_OTHER): Payer: Self-pay | Admitting: *Deleted

## 2008-11-14 LAB — CBC WITH DIFFERENTIAL/PLATELET
Basophils Absolute: 0 10*3/uL (ref 0.0–0.1)
EOS%: 5.4 % (ref 0.0–7.0)
Eosinophils Absolute: 0.3 10*3/uL (ref 0.0–0.5)
HCT: 35.5 % — ABNORMAL LOW (ref 38.4–49.9)
HGB: 12.5 g/dL — ABNORMAL LOW (ref 13.0–17.1)
MCH: 33.1 pg (ref 27.2–33.4)
MCV: 94.2 fL (ref 79.3–98.0)
NEUT#: 4 10*3/uL (ref 1.5–6.5)
NEUT%: 63.3 % (ref 39.0–75.0)
RDW: 14.2 % (ref 11.0–14.6)
lymph#: 1.5 10*3/uL (ref 0.9–3.3)

## 2008-11-14 LAB — COMPREHENSIVE METABOLIC PANEL
AST: 30 U/L (ref 0–37)
Albumin: 3.9 g/dL (ref 3.5–5.2)
BUN: 21 mg/dL (ref 6–23)
Calcium: 10 mg/dL (ref 8.4–10.5)
Chloride: 102 mEq/L (ref 96–112)
Creatinine, Ser: 1.64 mg/dL — ABNORMAL HIGH (ref 0.40–1.50)
Glucose, Bld: 129 mg/dL — ABNORMAL HIGH (ref 70–99)
Potassium: 4.8 mEq/L (ref 3.5–5.3)

## 2008-11-14 LAB — CEA: CEA: 0.8 ng/mL (ref 0.0–5.0)

## 2008-11-18 ENCOUNTER — Ambulatory Visit: Payer: Self-pay | Admitting: Vascular Surgery

## 2009-01-21 ENCOUNTER — Inpatient Hospital Stay (HOSPITAL_COMMUNITY): Admission: RE | Admit: 2009-01-21 | Discharge: 2009-01-28 | Payer: Self-pay | Admitting: Orthopaedic Surgery

## 2009-03-14 ENCOUNTER — Ambulatory Visit: Payer: Self-pay | Admitting: Oncology

## 2009-03-18 ENCOUNTER — Ambulatory Visit (HOSPITAL_COMMUNITY): Admission: RE | Admit: 2009-03-18 | Discharge: 2009-03-18 | Payer: Self-pay | Admitting: Oncology

## 2010-04-26 ENCOUNTER — Encounter: Payer: Self-pay | Admitting: Oncology

## 2010-07-09 LAB — PROTIME-INR
INR: 1.41 (ref 0.00–1.49)
INR: 1.54 — ABNORMAL HIGH (ref 0.00–1.49)
INR: 1.87 — ABNORMAL HIGH (ref 0.00–1.49)
INR: 1.91 — ABNORMAL HIGH (ref 0.00–1.49)
INR: 1.99 — ABNORMAL HIGH (ref 0.00–1.49)
Prothrombin Time: 17.1 s — ABNORMAL HIGH (ref 11.6–15.2)
Prothrombin Time: 18.4 seconds — ABNORMAL HIGH (ref 11.6–15.2)
Prothrombin Time: 21.4 s — ABNORMAL HIGH (ref 11.6–15.2)
Prothrombin Time: 21.7 seconds — ABNORMAL HIGH (ref 11.6–15.2)
Prothrombin Time: 22.4 s — ABNORMAL HIGH (ref 11.6–15.2)

## 2010-07-09 LAB — BASIC METABOLIC PANEL
BUN: 21 mg/dL (ref 6–23)
BUN: 22 mg/dL (ref 6–23)
CO2: 25 mEq/L (ref 19–32)
CO2: 26 mEq/L (ref 19–32)
CO2: 26 mEq/L (ref 19–32)
Calcium: 8.2 mg/dL — ABNORMAL LOW (ref 8.4–10.5)
Calcium: 8.6 mg/dL (ref 8.4–10.5)
Chloride: 103 mEq/L (ref 96–112)
Chloride: 105 mEq/L (ref 96–112)
Creatinine, Ser: 1.63 mg/dL — ABNORMAL HIGH (ref 0.4–1.5)
Creatinine, Ser: 2.37 mg/dL — ABNORMAL HIGH (ref 0.4–1.5)
GFR calc Af Amer: 33 mL/min — ABNORMAL LOW (ref >60)
GFR calc Af Amer: 51 mL/min — ABNORMAL LOW (ref >60)
GFR calc non Af Amer: 30 mL/min — ABNORMAL LOW (ref >60)
GFR calc non Af Amer: 48 mL/min — ABNORMAL LOW (ref >60)
Glucose, Bld: 147 mg/dL — ABNORMAL HIGH (ref 70–99)
Glucose, Bld: 149 mg/dL — ABNORMAL HIGH (ref 70–99)
Glucose, Bld: 212 mg/dL — ABNORMAL HIGH (ref 70–99)
Potassium: 4.2 mEq/L (ref 3.5–5.1)
Potassium: 4.2 mEq/L (ref 3.5–5.1)
Potassium: 4.4 mEq/L (ref 3.5–5.1)
Sodium: 137 mEq/L (ref 135–145)
Sodium: 138 mEq/L (ref 135–145)

## 2010-07-09 LAB — CBC
HCT: 26.2 % — ABNORMAL LOW (ref 39.0–52.0)
HCT: 26.2 % — ABNORMAL LOW (ref 39.0–52.0)
Hemoglobin: 9.2 g/dL — ABNORMAL LOW (ref 13.0–17.0)
Hemoglobin: 9.3 g/dL — ABNORMAL LOW (ref 13.0–17.0)
MCHC: 34.8 g/dL (ref 30.0–36.0)
MCHC: 35.1 g/dL (ref 30.0–36.0)
MCHC: 35.3 g/dL (ref 30.0–36.0)
MCHC: 35.5 g/dL (ref 30.0–36.0)
MCHC: 35.5 g/dL (ref 30.0–36.0)
MCV: 97.2 fL (ref 78.0–100.0)
MCV: 98 fL (ref 78.0–100.0)
Platelets: 146 K/uL — ABNORMAL LOW (ref 150–400)
Platelets: 155 K/uL (ref 150–400)
Platelets: 215 10*3/uL (ref 150–400)
RBC: 2.61 MIL/uL — ABNORMAL LOW (ref 4.22–5.81)
RBC: 2.67 MIL/uL — ABNORMAL LOW (ref 4.22–5.81)
RBC: 2.69 MIL/uL — ABNORMAL LOW (ref 4.22–5.81)
RBC: 2.7 MIL/uL — ABNORMAL LOW (ref 4.22–5.81)
RDW: 13.4 % (ref 11.5–15.5)
RDW: 13.6 % (ref 11.5–15.5)
RDW: 14 % (ref 11.5–15.5)
RDW: 14 % (ref 11.5–15.5)
WBC: 5.3 10*3/uL (ref 4.0–10.5)
WBC: 5.9 K/uL (ref 4.0–10.5)
WBC: 6.9 K/uL (ref 4.0–10.5)
WBC: 6.4 10*3/uL (ref 4.0–10.5)

## 2010-07-09 LAB — URINALYSIS, ROUTINE W REFLEX MICROSCOPIC
Bilirubin Urine: NEGATIVE
Glucose, UA: 100 mg/dL — AB
Glucose, UA: 250 mg/dL — AB
Hgb urine dipstick: NEGATIVE
Ketones, ur: NEGATIVE mg/dL
Ketones, ur: NEGATIVE mg/dL
Nitrite: NEGATIVE
Protein, ur: NEGATIVE mg/dL
Protein, ur: NEGATIVE mg/dL
Specific Gravity, Urine: 1.014 (ref 1.005–1.030)
Urobilinogen, UA: 0.2 mg/dL (ref 0.0–1.0)
pH: 5 (ref 5.0–8.0)

## 2010-07-09 LAB — DIFFERENTIAL
Basophils Absolute: 0 10*3/uL (ref 0.0–0.1)
Basophils Relative: 1 % (ref 0–1)
Eosinophils Absolute: 0.1 10*3/uL (ref 0.0–0.7)
Eosinophils Relative: 2 % (ref 0–5)
Eosinophils Relative: 6 % — ABNORMAL HIGH (ref 0–5)
Lymphocytes Relative: 16 % (ref 12–46)
Lymphocytes Relative: 21 % (ref 12–46)
Lymphs Abs: 0.9 10*3/uL (ref 0.7–4.0)
Monocytes Absolute: 0.6 10*3/uL (ref 0.1–1.0)
Monocytes Relative: 10 % (ref 3–12)
Neutrophils Relative %: 72 % (ref 43–77)

## 2010-07-09 LAB — GLUCOSE, CAPILLARY
Glucose-Capillary: 108 mg/dL — ABNORMAL HIGH (ref 70–99)
Glucose-Capillary: 124 mg/dL — ABNORMAL HIGH (ref 70–99)
Glucose-Capillary: 136 mg/dL — ABNORMAL HIGH (ref 70–99)
Glucose-Capillary: 143 mg/dL — ABNORMAL HIGH (ref 70–99)
Glucose-Capillary: 148 mg/dL — ABNORMAL HIGH (ref 70–99)
Glucose-Capillary: 157 mg/dL — ABNORMAL HIGH (ref 70–99)
Glucose-Capillary: 159 mg/dL — ABNORMAL HIGH (ref 70–99)
Glucose-Capillary: 163 mg/dL — ABNORMAL HIGH (ref 70–99)
Glucose-Capillary: 172 mg/dL — ABNORMAL HIGH (ref 70–99)
Glucose-Capillary: 172 mg/dL — ABNORMAL HIGH (ref 70–99)
Glucose-Capillary: 175 mg/dL — ABNORMAL HIGH (ref 70–99)
Glucose-Capillary: 180 mg/dL — ABNORMAL HIGH (ref 70–99)
Glucose-Capillary: 183 mg/dL — ABNORMAL HIGH (ref 70–99)
Glucose-Capillary: 183 mg/dL — ABNORMAL HIGH (ref 70–99)
Glucose-Capillary: 185 mg/dL — ABNORMAL HIGH (ref 70–99)
Glucose-Capillary: 193 mg/dL — ABNORMAL HIGH (ref 70–99)
Glucose-Capillary: 213 mg/dL — ABNORMAL HIGH (ref 70–99)
Glucose-Capillary: 254 mg/dL — ABNORMAL HIGH (ref 70–99)

## 2010-07-09 LAB — HEMOGLOBIN A1C
Hgb A1c MFr Bld: 6 % (ref 4.6–6.1)
Mean Plasma Glucose: 126 mg/dL

## 2010-07-09 LAB — BASIC METABOLIC PANEL WITH GFR
BUN: 19 mg/dL (ref 6–23)
BUN: 20 mg/dL (ref 6–23)
BUN: 20 mg/dL (ref 6–23)
CO2: 26 meq/L (ref 19–32)
CO2: 27 meq/L (ref 19–32)
CO2: 29 meq/L (ref 19–32)
Calcium: 8.6 mg/dL (ref 8.4–10.5)
Calcium: 8.9 mg/dL (ref 8.4–10.5)
Calcium: 8.9 mg/dL (ref 8.4–10.5)
Chloride: 100 meq/L (ref 96–112)
Chloride: 104 meq/L (ref 96–112)
Chloride: 104 meq/L (ref 96–112)
Creatinine, Ser: 1.38 mg/dL (ref 0.4–1.5)
Creatinine, Ser: 1.7 mg/dL — ABNORMAL HIGH (ref 0.4–1.5)
Creatinine, Ser: 1.89 mg/dL — ABNORMAL HIGH (ref 0.4–1.5)
GFR calc non Af Amer: 35 mL/min — ABNORMAL LOW
GFR calc non Af Amer: 40 mL/min — ABNORMAL LOW
GFR calc non Af Amer: 51 mL/min — ABNORMAL LOW
Glucose, Bld: 159 mg/dL — ABNORMAL HIGH (ref 70–99)
Glucose, Bld: 169 mg/dL — ABNORMAL HIGH (ref 70–99)
Glucose, Bld: 177 mg/dL — ABNORMAL HIGH (ref 70–99)
Potassium: 4 meq/L (ref 3.5–5.1)
Potassium: 4.4 meq/L (ref 3.5–5.1)
Potassium: 4.6 meq/L (ref 3.5–5.1)
Sodium: 136 meq/L (ref 135–145)
Sodium: 137 meq/L (ref 135–145)
Sodium: 139 meq/L (ref 135–145)

## 2010-07-09 LAB — COMPREHENSIVE METABOLIC PANEL WITH GFR
ALT: 30 U/L (ref 0–53)
AST: 40 U/L — ABNORMAL HIGH (ref 0–37)
Albumin: 4 g/dL (ref 3.5–5.2)
Alkaline Phosphatase: 35 U/L — ABNORMAL LOW (ref 39–117)
BUN: 24 mg/dL — ABNORMAL HIGH (ref 6–23)
CO2: 26 meq/L (ref 19–32)
Calcium: 10 mg/dL (ref 8.4–10.5)
Chloride: 102 meq/L (ref 96–112)
Creatinine, Ser: 1.72 mg/dL — ABNORMAL HIGH (ref 0.4–1.5)
GFR calc non Af Amer: 40 mL/min — ABNORMAL LOW
Glucose, Bld: 155 mg/dL — ABNORMAL HIGH (ref 70–99)
Potassium: 5.4 meq/L — ABNORMAL HIGH (ref 3.5–5.1)
Sodium: 135 meq/L (ref 135–145)
Total Bilirubin: 0.5 mg/dL (ref 0.3–1.2)
Total Protein: 7.2 g/dL (ref 6.0–8.3)

## 2010-07-09 LAB — URINE CULTURE
Colony Count: NO GROWTH
Colony Count: NO GROWTH
Culture: NO GROWTH
Special Requests: NEGATIVE

## 2010-07-09 LAB — TYPE AND SCREEN
ABO/RH(D): A NEG
Antibody Screen: NEGATIVE

## 2010-07-09 LAB — URINE MICROSCOPIC-ADD ON

## 2010-07-09 LAB — APTT: aPTT: 27 s (ref 24–37)

## 2010-07-16 LAB — GLUCOSE, CAPILLARY: Glucose-Capillary: 177 mg/dL — ABNORMAL HIGH (ref 70–99)

## 2010-07-20 LAB — BASIC METABOLIC PANEL
BUN: 20 mg/dL (ref 6–23)
BUN: 26 mg/dL — ABNORMAL HIGH (ref 6–23)
BUN: 29 mg/dL — ABNORMAL HIGH (ref 6–23)
BUN: 46 mg/dL — ABNORMAL HIGH (ref 6–23)
CO2: 26 mEq/L (ref 19–32)
Calcium: 8.7 mg/dL (ref 8.4–10.5)
Calcium: 8.9 mg/dL (ref 8.4–10.5)
Calcium: 9.3 mg/dL (ref 8.4–10.5)
Chloride: 104 mEq/L (ref 96–112)
Chloride: 104 mEq/L (ref 96–112)
Chloride: 106 mEq/L (ref 96–112)
Creatinine, Ser: 1.2 mg/dL (ref 0.4–1.5)
Creatinine, Ser: 1.43 mg/dL (ref 0.4–1.5)
Creatinine, Ser: 1.57 mg/dL — ABNORMAL HIGH (ref 0.4–1.5)
GFR calc Af Amer: 53 mL/min — ABNORMAL LOW (ref >60)
GFR calc Af Amer: >60 mL/min (ref >60)
GFR calc Af Amer: >60 mL/min (ref >60)
Glucose, Bld: 271 mg/dL — ABNORMAL HIGH (ref 70–99)
Glucose, Bld: 307 mg/dL — ABNORMAL HIGH (ref 70–99)
Potassium: 5.4 mEq/L — ABNORMAL HIGH (ref 3.5–5.1)
Potassium: 5.7 mEq/L — ABNORMAL HIGH (ref 3.5–5.1)
Potassium: 6.3 mEq/L (ref 3.5–5.1)
Sodium: 135 mEq/L (ref 135–145)
Sodium: 135 mEq/L (ref 135–145)
Sodium: 137 mEq/L (ref 135–145)

## 2010-07-20 LAB — CBC
HCT: 31 % — ABNORMAL LOW (ref 39.0–52.0)
Hemoglobin: 10.6 g/dL — ABNORMAL LOW (ref 13.0–17.0)
Hemoglobin: 10.8 g/dL — ABNORMAL LOW (ref 13.0–17.0)
MCHC: 35.3 g/dL (ref 30.0–36.0)
MCV: 92.6 fL (ref 78.0–100.0)
RBC: 3.35 MIL/uL — ABNORMAL LOW (ref 4.22–5.81)
RDW: 12.8 % (ref 11.5–15.5)
WBC: 8.1 10*3/uL (ref 4.0–10.5)

## 2010-07-20 LAB — HEMOGLOBIN A1C
Hgb A1c MFr Bld: 9 % — ABNORMAL HIGH (ref 4.6–6.1)
Mean Plasma Glucose: 212 mg/dL

## 2010-07-20 LAB — GLUCOSE, CAPILLARY
Glucose-Capillary: 180 mg/dL — ABNORMAL HIGH (ref 70–99)
Glucose-Capillary: 200 mg/dL — ABNORMAL HIGH (ref 70–99)
Glucose-Capillary: 247 mg/dL — ABNORMAL HIGH (ref 70–99)
Glucose-Capillary: 258 mg/dL — ABNORMAL HIGH (ref 70–99)
Glucose-Capillary: 270 mg/dL — ABNORMAL HIGH (ref 70–99)
Glucose-Capillary: 277 mg/dL — ABNORMAL HIGH (ref 70–99)
Glucose-Capillary: 405 mg/dL — ABNORMAL HIGH (ref 70–99)

## 2010-07-20 LAB — URINE CULTURE: Colony Count: 3000

## 2010-08-18 NOTE — Assessment & Plan Note (Signed)
[facility]                         ELECTROPHYSIOLOGY OFFICE NOTE   [name], [name]                     MRN:          [id number]  DATE:12/19/2007                            DOB:          [birth year]    [name] is seen as an add-on today because of concern about his loop  pocket.  Exploration of the medial aspect of the pocket demonstrated  retained suture, which was removed.  He is revising to use warm  compresses.  He talks rather forcibly about wanting the device removed,  I went over with him the indication and it's potential utility.  He is  willing to wait until he sees Dr. Ladona Ridgel in November to make that final  decision.     Duke Salvia, MD, [facility]  Electronically Signed    SCK/MedQ  DD: 12/19/2007  DT: 12/19/2007  Job #: [id number]

## 2010-08-18 NOTE — Op Note (Signed)
NAMEJACOBE, STUDY NO.:  [id number]   MEDICAL RECORD NO.:  [id number]          PATIENT TYPE:  OIB   LOCATION:  2899                         FACILITY:  [facility]   PHYSICIAN:  Doylene Canning. Ladona Ridgel, MD    DATE OF BIRTH:  Jun 18, 1940   DATE OF PROCEDURE:  11/06/2007  DATE OF DISCHARGE:                               OPERATIVE REPORT   PROCEDURE PERFORMED:  Head-up tilt table testing followed by insertion  of an implantable loop recorder.   INDICATIONS:  Unexplained syncope.   INTRODUCTION:  The patient is a very pleasant 70 year old man who has a  history of spells where he suddenly will fall out, it lasted just a few  seconds.  At other times, they have been longer, typically they occur  when he is standing.  There is no obvious etiology.  He is now referred  for a head-up tilt table test to rule orthostasis or neurally mediated  syncope and if negative, insertion of implantable loop recorder.   PROCEDURE:  After informed consent was obtained, the patient was taken  to the diagnostic EP lab in a fasting state.  After usual preparation,  he was placed in the supine position where his initial heart rate was in  the 75-85 range.  The blood pressure was in the 140s.  He was maintained  in the upright head-up tilt table position for a total of 45 minutes.  During this time, his heart rates ranged in the mid 80s up to a high of  90 beats per minute.  Blood pressure remained in the 130 range with  lowest blood pressure dropping down to 108 mmHg.  The patient was  asymptomatic during this time.  At 43 minutes into tilting, carotid  sinus massage was carried out first on the right carotid, then in the  left carotid, and there was no bradycardia, and no hypotension noted.  He was subsequently placed back in the supine position and prepped for  insertion of an implantable loop recorder.   After the patient was more deeply sedated with fentanyl and Versed, and  prepped in the  usual manner, 30 mL of lidocaine was infiltrated into the  left pectoral region.  A 3-cm incision was carried out over this region  and electrocautery was utilized to dissect down to the fascial plane.  A  small subcutaneous pocket was fashioned with electrocautery.  The  Medtronic Reveal DX implantable loop recorder, serial number [id number]  was placed in the subcutaneous pocket and secured with silk sutures.  The pocket was irrigated with kanamycin.  Electrocautery was utilized to  assure hemostasis, and the incision was closed with 2-0 Vicryl and 3-0  Vicryl.  Benzoin was painted on the skin.  Steri-Strips were applied,  and the patient was returned to his room in satisfactory condition.   COMPLICATIONS:  There were no immediate procedure complications.   RESULTS:  Demonstrates a negative head-up tilt table test for inducible  syncope or for any evidence of orthostasis, also demonstrates successful  insertion of implantable loop recorder without immediate procedure  complication.  Doylene Canning. Ladona Ridgel, MD  Electronically Signed     GWT/MEDQ  D:  11/06/2007  T:  11/07/2007  Job:  [id number]   cc:   Rollene Rotunda, MD, [facility]  Burnell Blanks, MD

## 2010-08-18 NOTE — Assessment & Plan Note (Signed)
[facility] OFFICE NOTE   [name], [name]                     MRN:          [id number]  DATE:08/11/2007                            DOB:          1940/09/23    PRIMARY CARE PHYSICIAN:  Burnell Blanks, MD   REASON FOR PRESENTATION:  Syncope.   HISTORY OF PRESENT ILLNESS:  The patient is a 70 year old gentleman with  a history of syncope.  He reports that this has been going on for years.  However, it became really severe late last year, early this year.  He  was sometimes doing this 4 or 5 times a day.  He would only have it when  he stood.  He would not notice it lying down.  He would not notice it  sitting down.  When he stood up, he would feel his legs become weak.  He  would go down to the ground, losing consciousness.  He said he did not  have any trauma with any of these episodes though he hit hard.  He  would be out for only a few seconds and know where he was when he came  to.  He blamed it on medications.  Some of these drugs (I am not sure  which ones) were stopped and he thought the symptoms got better.  He  says that several years ago when he had surgery he was noted to have a  significant drop in his blood pressure with standing but this has not  been checked in a few years.  He has otherwise had a considerable  evaluation.  He wore an event monitor in January.  He had no events he  reported during that.  There were only PVCs noted.  He has had an MRI  and has seen Dr. Nash Shearer in neurology.  This demonstrated no acute  infarct.  He had sinusitis.  He has had carotid Dopplers demonstrating  moderate-size right and left-sided plaque 40-60% on both sides.  He had  an echocardiogram which demonstrates that the overall left ventricular  ejection fraction was 40-45% with diffuse hypokinesis.  There are no  significant valvular abnormalities.   The patient denies any shortness of breath or chest  discomfort.  He has  no neck or arm discomfort.  He does not notice any palpitations.  He has  had no presyncope or syncope.   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   PAST SURGICAL HISTORY:  Abdominal coloproctectomy, abdominal aortic  aneurysm resected and grafted, arthroscopy of his knee.   ALLERGIES:  DILAUDID, ATIVAN, FENTANYL AND PERCOCET.   MEDICATIONS:  Fenofibrate 134 mg daily; metformin 2000 mg daily;  lisinopril 10 mg daily; glipizide 2.5 mg b.i.d.;  paroxetine 30 mg  daily; Sanctura 60 mg daily; Lovaza; Cialis; aspirin 81 mg daily;  Centrum;  simvastatin 40 mg daily; B12, B6 and B2 combined.  SOCIAL HISTORY:  The patient is self-employed.  He is married.  He has 3  children.  He quit smoking 35 years ago.  He states he does not drink  alcohol any longer.   FAMILY HISTORY:  Noncontributory for early coronary artery disease  though he has 2 sisters who are alive with heart problems.   REVIEW OF SYSTEMS:  Positive for headaches, decreased hearing, dentures.  Negative for other systems.   PHYSICAL EXAMINATION:  The patient is in no distress.  Blood pressure  148/78.  He does have a 30-point blood pressure drop with orthostatics  at 0 minutes without a compensatory heart rate increase, heart rate 89  and regular, weight 243 pounds, body mass index 32.5.  HEENT:  Eyelids unremarkable.  Pupils equal, round, reactive to light.  Fundi not visualized.  Oral mucosa unremarkable.  NECK:  No jugular venous distention at 45 degrees.  Carotid upstrokes  brisk and symmetric.  No bruits, no thyromegaly.  LYMPHATICS:  No cervical, axillary or inguinal adenopathy.  LUNGS:  Clear to auscultation bilaterally.   BACK:  No costovertebral mass.  CHEST:  Unremarkable.  HEART:  PMI not displaced or sustained.  S1, S2 within normal.  No S3,  no S4.  No clicks, rubs, no murmurs.  ABDOMEN:  Colostomy bag in place.  No rebound, guarding, hepatomegaly or  splenomegaly.  SKIN:  No rashes.  No nodules.  EXTREMITIES:  2+ pulses.  No edema, cyanosis or clubbing.  NEURO:  Oriented to person, place and time.  Cranial nerves II-XII  grossly intact.  Motor grossly intact.   EKG:  Sinus rhythm, rate 89, axis within normal.  Intervals within  normal.  No acute ST-T wave change.   ASSESSMENT/PLAN:  1. Syncope.  The most obvious objective finding with this patient is      his significant drop in systolic blood pressure with standing.  His      symptoms sound like they could be orthostatic.  Therefore, the      first step will be to place compression stockings.  I would love      for these to be thigh high but I do not think he will tolerate      them.  Therefore, I will place knee-high compression stockings.      Will see how he does symptomatically with these.  If this does not      work, we may need to consider discontinuing his antihypertensives      altogether, thigh-high compression stockings or therapy such as      midodrine.  At this point I do not strongly suspect an arrhythmia,      though placing a loop monitor might be in his future.  2. Mildly reduced ejection fraction.  The patient does have a mildly      reduced ejection fraction and I will plan a stress perfusion study      when I see him the future.  He has no symptoms currently of angina      or heart failure.  3. Hypertension as above.  4. Followup.  I will see the patient back in about 6 weeks or sooner      if needed.     Rollene Rotunda, MD, [facility]  Electronically Signed    JH/MedQ  DD: 08/11/2007  DT: 08/11/2007  Job #: [id number]   cc:   Burnell Blanks, MD

## 2010-08-18 NOTE — Discharge Summary (Signed)
[name], QUINTO NO.:  [id number]   MEDICAL RECORD NO.:  [id number]          PATIENT TYPE:  OIB   LOCATION:  2899                         FACILITY:  [facility]   PHYSICIAN:  Doylene Canning. Ladona Ridgel, MD    DATE OF BIRTH:  07/31/1940   DATE OF ADMISSION:  11/06/2007  DATE OF DISCHARGE:  11/06/2007                               DISCHARGE SUMMARY   FINAL DIAGNOSES:  1. Syncope with history of unclear etiology.  2. Negative tilt study November 06, 2007.  3. Implantable loop recorder November 06, 2007.   SECONDARY DIAGNOSES:  1. Coronary artery disease.  2. Status post resection grafting of abdominal aortic aneurysm.  3. Hypertension.  4. Dyslipidemia.  5. Diabetes.  6. Depression.  7. Osteoarthritis.  8. History of rectal adenocarcinoma currently with colostomy.   PROCEDURE:  1. November 06, 2007, head-up tilt study which was negative  2. Implantable loop recorder November 06, 2007, Dr. Lewayne Bunting.  The      patient has had no postprocedural complications discharging the      same day November 06, 2007.   BRIEF HISTORY:  [name].  He has been having  dizzy spells.  He was referred to Dr. Antoine Poche for evaluation of  syncope.  He notes that he has had multiple episodes of near syncope in  the past but only one episode of true syncope.  The spell occurred  several weeks ago.  He was unconscious for about 1 minute.  He had no  loss of bowel or bladder ___incontinence, no diaphoresis, no nausea.  The patient was given compression stockings.  These did not help his  dizziness   The etiology of the patient's syncope is unclear but maybe orthostasis  but the patient did have orthostatic blood pressure measurements and the  change after 5 minutes was not traumatic.  Dr. Ladona Ridgel recommended head-  up tilt table study and if the study is negative, then consideration for  loop recorder implantation.   HOSPITAL COURSE:  The patient presents electively November 06, 2007.  The  patient underwent head-up tilt study and this was negative.  The patient  then underwent implantable loop recorder,  I believe a [employer] device.  The patient was discharged on November 06, 2007, with the following  instructions.  In the morning of November 07, 2007, he is to remove the bandage and leave  his incision open to the air.  He is to keep his incision dry for the  next 5 days, to sponge bathe until Saturday November 11, 2007.   MEDICATIONS AT DISCHARGE:  1. Fenofibrate 134 mg daily.  2. Metformin 1 g twice daily.  3. Lisinopril 10 mg daily.  4. Glipizide 2.5 mg twice daily.  5. Paroxetine 10 mg daily.  6. Enteric-coated aspirin 81 mg daily.  7. Multivitamin daily.  8. Simvastatin 40 mg daily at bedtime.   [facility]  Wednesday, November 22, 2007, at 9:20.      Maple Mirza, PA  Doylene Canning. Ladona Ridgel, MD  Electronically Signed    GM/MEDQ  D:  11/06/2007  T:  11/07/2007  Job:  [id number]   cc:   Madolyn Frieze. Jens Som, MD, [facility]

## 2010-08-18 NOTE — Discharge Summary (Signed)
NAMEJAQUARIUS, SEDER NO.:  [id number]   MEDICAL RECORD NO.:  [id number]          PATIENT TYPE:  OIB   LOCATION:  2899                         FACILITY:  [facility]   PHYSICIAN:  Doylene Canning. Ladona Ridgel, MD    DATE OF BIRTH:  [birth year]   DATE OF ADMISSION:  07/23/2008  DATE OF DISCHARGE:  07/23/2008                               DISCHARGE SUMMARY   This patient has no known drug allergies.  Time for this dictation and  examination greater than 35 minutes.   FINAL DIAGNOSES:  1. The patient was unexplained syncope.  2. Loop recorder implanted, November 06, 2007.  3. Recurrent syncope does not register on loop recorder as a cardiac      dysrhythmia.  4. Loop at end-of-life, now explanted by Dr. Ladona Ridgel, July 23, 2008.   SECONDARY DIAGNOSES:  1. Coronary artery disease, diagnosed at catheterization in 1992 and      treated with percutaneous transluminal coronary angioplasty.  2. Aortoiliac occlusive disease including abdominal aortic aneurysm      and iliac aneurysms, discovered at catheterization in 1992 and      treated with resection and grafting of abdominal aortic aneurysm.  3. Hypertension.  4. Dyslipidemia.  5. History of pan-ulcerative colitis.  6. Adeno cancer of the rectum.  The patient is status post total      coloproctectomy with adjuvant radiation therapy and chemotherapy.  7. Osteoarthritis with gout.  8. Remote history of ethanol abuse.  9. History of cerebrovascular accident with no sequelae.  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  11.Status post transurethral resection for benign prostatic      hypertrophy.  12.Status post cholecystectomy.  13.Bilateral cataract repair.  14.Depression.  15.Degenerative joint disease.   BRIEF HISTORY:  Mr. Mineer is a 70 year old male.  He has a history of  unexplained syncope.  He is status post loop implantation in November 06, 2007.  He has had recurrent spells since loop was  implanted but it at no  time did the loop actually indicates that a cardiac dysrhythmia was  occurring.  The patient has a history of coloproctectomy and needs an  MRI to re-image the abdomen.  This will not be possible as long as the  loop recorder is in place.  He presents on April 15 to discuss loop  explantation.   We will explant the loop recorder as soon as it is convenient.  There is  a question whether he has recurrence of malignancy and explantation of  loop recorder is necessary.  He will have an MRI once the loop recorder  is removed.   HOSPITAL COURSE:  The patient presents electively on April 20.  He  underwent explantation of the loop recorder the same day by Dr. Ladona Ridgel  and is discharging the same day.  He is asked to remove the bandage on  Wednesday, April 21 and to leave the incision open to the air.  He is to  keep the incision dry for next 5 days and to sponge bathe until Sunday,  April 25.   MEDICATIONS AT DISCHARGE:  1. NovoLog/Lantus.  Continue as he has been taking.  2. Simvastatin 40 mg daily at bedtime.  3. Paxil 20 mg daily.  4. Enteric-coated aspirin 81 mg daily.  5. Gabapentin 300 mg twice daily.  6. Fenofibrate 134 mg daily.  7. Lovaza 1000 mg 4 caps daily.  8. Benicar 20 mg daily.   He follows up at [facility], [address] in  [place] for incision check, Thursday, May 6 at 9:20 in the morning.  Laboratory studies pertinent to this admission were drawn on July 18, 2008; sodium 137, potassium 4.3, chloride 101, carbonate 28, glucose  279, BUN is 23, and creatinine 1.5.  White cells 6.1, hemoglobin 12.4,  hematocrit 35.3, and platelets are 228.  Protime 10.7, INR is 1, and PTT  is 27.1.      Maple Mirza, PA      Doylene Canning. Ladona Ridgel, MD  Electronically Signed    GM/MEDQ  D:  07/23/2008  T:  07/24/2008  Job:  [id number]   cc:   Everardo Beals. Juanda Chance, MD, [facility]  Burnell Blanks, MD

## 2010-08-18 NOTE — Assessment & Plan Note (Signed)
[name]                                 ON-CALL NOTE   [name], [name]                     MRN:          [id number]  DATE:12/16/2007                            DOB:          1940/12/09    I spoke with Avie Arenas, the spouse of [name].  She stated her  husband had recently had a loop recorder implanted approximately 3 to 4  weeks ago.  Over the last few days the patient has complained of  increased soreness around the loop recorder site without any fever or  chills.  However, on this date, wife noticed he is more tender to touch  and warm with signs of erythema.  She states there is mild drainage from  1 localized point in the incision of a serosanguineous drainage.  The  patient once again denies any fever or chills or any symptoms suggestive  of infection.  I spoke with [name].  He is the cardiology fellow  on tonight.  He recommended starting the patient on Keflex 500 mg q.i.d.  for 14 days and have the patient to follow up with a wound check on  Monday.  I instructed the patient's wife to pick up the Keflex  prescription tonight.  I have called it in for her.  She stated that she  would.  I will also call [name], Dr. [name] nurse, and make her aware  of the patient's status.      Dorian Pod, ACNP  Electronically Signed      [name]. [name], MD, [facility]  Electronically Signed   MB/MedQ  DD: 12/17/2007  DT: 12/17/2007  Job #: [id number]

## 2010-08-18 NOTE — Op Note (Signed)
NAMEALEKSEY, NEWBERN NO.:  [id number]   MEDICAL RECORD NO.:  [id number]          PATIENT TYPE:  OIB   LOCATION:  2899                         FACILITY:  [facility]   PHYSICIAN:  Doylene Canning. Ladona Ridgel, MD    DATE OF BIRTH:  [birth year]   DATE OF PROCEDURE:  07/23/2008  DATE OF DISCHARGE:  07/23/2008                               OPERATIVE REPORT   PROCEDURE PERFORMED:  Removal of previously implanted implantable loop  recorder.   INDICATIONS:  Unexplained syncope status post loop recorder with the  patient now having a need for MRI scan and having no documented  arrhythmias with his loop recorder.   INTRODUCTION:  The patient is a very pleasant 70 year old man with  unexplained syncope.  He needs an MRI scan for concern about possible  colon cancer as he has a history of cancer of the rectum in the past.  He is now referred for removal of his implantable loop recorder so that  he can proceed with MRI scanning.   PROCEDURE:  After informed consent was obtained, the patient was taken  to the diagnostic EP lab in fasting state.  After usual preparation and  draping, intravenous fentanyl and midazolam were given for sedation.  A  30 mL of lidocaine was infiltrated over the old implantable loop  recorder insertion site.  A 3-cm incision was carried out over this  region.  Electrocautery was utilized to dissect down to the fascial  plane.  The loop recorder was removed without difficulty utilizing Kelly  forceps.  The sewing suture was also removed without difficulty.  Electrocautery was utilized to assure hemostasis.  Kanamycin was  utilized to irrigate the pocket, and the incision was closed with 2-0  Vicryl for and 4-0 Vicryl.  Benzoin was painted on skin.  Steri-Strips  were applied and a pressure dressing was placed and the patient was  returned his room in satisfactory condition.   COMPLICATIONS:  There were no immediate procedure complications.   RESULTS:  Demonstrate  successful removal of implantable loop recorder in  a patient with unexplained syncope.      Doylene Canning. Ladona Ridgel, MD  Electronically Signed     GWT/MEDQ  D:  07/23/2008  T:  07/24/2008  Job:  [id number]

## 2010-08-18 NOTE — H&P (Signed)
[name], [name] NO.:  [id number]   MEDICAL RECORD NO.:  [id number]          PATIENT TYPE:  INP   LOCATION:  1441                         FACILITY:  [facility]   PHYSICIAN:  Lonia Blood, M.D.DATE OF BIRTH:  [birth year]   DATE OF ADMISSION:  04/05/2008  DATE OF DISCHARGE:                              HISTORY & PHYSICAL   PRIMARY CARE PHYSICIAN:  Unassigned.   [name] AREA ONCOLOGIST:  Lajuana Matte, MD.   CHIEF COMPLAINT:  Called to due to hyperkalemia on labs done at [facility] today.   HISTORY OF PRESENT ILLNESS:  [name] is a pleasant 70-year-  old gentleman who lives in the [place] area.  He has a significant  history of adenocarcinoma of the rectum and underwent a  coloproctectomy with subsequent XRT and chemo in 2007.  He admits to  having an upper respiratory infection in late November or early December  and states that he has felt real tired since that time.  He admits to  decreased intake of foods, but does state that he has increased his  intake of liquids.  Otherwise however, he has been going about his usual  life and has had no specific complaints.  Yesterday, he presented to the  [facility] for a routine follow up visit with no specific complaints.  Routine labs were obtained and the patient was sent home.  The patient  was then called at home and told that his potassium level was  critically high and advised to proceed immediately to the emergency  room.  The patient therefore presented to [facility] Emergency Room.  He was found to have a potassium of 6.1 here, but perhaps more  importantly he was found to have a sodium of 126, a BUN of 54 and a  creatinine of 2.06 with a serum glucose of 375.  The patient denies any  localizing symptoms.  He does again admit that his p.o. intake of solids  has been significantly diminished for approximately 1-1/2 months now.  Otherwise, he denies fevers, chills, chest pain,  shortness of breath,  nausea, vomiting, diarrhea.   REVIEW OF SYSTEMS:  Comprehensive review of systems is unremarkable with  the exception of the positive elements noted in the history of present  illness above.   PAST MEDICAL HISTORY:  1. Chronic normocytic anemia.  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  3. 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  4. Idiopathic syncope status post Loop recorder August 2009 with      history of unremarkable tilt-table testing.  5. Ulcerative colitis status post colectomy 2007.  6. Coronary artery disease status post PTCA x2.  7. Status post resection and grafting of abdominal aortic aneurysm.  8. Hypertension.  9. Hypercholesterolemia.  10.Diabetes mellitus.  11.Depression.  12.History of heavy tobacco abuse - discontinued for years now.  13.History of heavy alcohol abuse - discontinued for years now.  14.Status post cholecystectomy.  15.History of CVA 1972 with no lasting deficits.  16.Gout.  17.Bilateral cataract extractions.   MEDICATIONS:  1. Metformin 1000 mg b.i.d.  2. Glipizide 10 mg b.i.d.  3. HCTZ 25 mg daily.  4. Lisinopril 15 mg daily.  5. Paxil 20 mg daily.  6. Temazepam 30 mg q.h.s.  7. Zocor 40 mg q.h.s.  8. Aspirin 81 mg daily.  9. Prednisone 20 mg b.i.d.  10.Neurontin 300 mg b.i.d.  11.Fenofibrate 134 mg daily.  12.Doxycycline 100 mg b.i.d.  13.Ultram 37.5 mg p.r.n.   ALLERGIES:  NO KNOWN DRUG ALLERGIES.   FAMILY HISTORY:  Noncontributory.   SOCIAL HISTORY:  This patient is self-employed with a company that  [employer]	[place].  He states that business is brisk.  He is  married.  He is not presently drinking or smoking.  [place] outside [place]  [place]   LABORATORY DATA:  Sodium is low at 126, potassium is high at 6.1,  chloride and bicarb are normal.  BUN is markedly elevated at 54,  creatinine is elevated at 2.06.  Serum glucose is markedly  elevated at  375.  White count 13.5, hemoglobin 11.2 with an MCV of 91, platelet  count is normal.  Magnesium is normal.  Urinalysis reveals small  leucocyte esterase, greater than 1000 glucose and 36 white blood cells.  Creatinine was noted to be 0.94 in January 2008.   DIAGNOSTICS:  Chest x-ray reveals no acute disease.   PHYSICAL EXAMINATION:  VITAL SIGNS:  Temperature 97.5, blood pressure  152/84, heart rate 90, respiratory rate 18, O2 sat is 93% on room air.  CBG is 438.  GENERAL:  Well-developed, well-nourished gentleman in no acute  respiratory stress.  HEENT:  Normocephalic, atraumatic.  Pupils are  equal, round and reactive to light and accommodation.  Extraocular  muscles are intact bilaterally.  OC/OP clear except for very  dry mucous  membranes.  NECK:  No JVD.  LUNGS:  Clear to auscultation bilaterally without wheezes or rhonchi.  CARDIOVASCULAR:  Regular rate and rhythm at present time with the  exception of mild tachycardia approximately 100 beats per minute with no  appreciable gallop, rub or murmur.  ABDOMEN:  Nontender, nondistended, soft, bowel sounds are present.  Right lower quadrant ostomy in place with no surrounding erythema,  fluctuance or induration.  Bowel sounds are present.  The abdomen is  nontender and soft.  There is no rebound.  There is no ascites.  The  abdomen is not distended.  EXTREMITIES:  No significant cyanosis, clubbing, edema in bilateral  lower extremities.  NEUROLOGIC:  Alert and oriented x4.  Cranial nerves  II-XII are intact bilaterally, 5/5 strength in bilateral upper and lower  extremities.  Intact sensation throughout.   IMPRESSION/PLAN:  1. Pyelonephritis.  Given the patient's elevated white count and      positive urinalysis, I am  concerned that he could be suffering      with a significant pyelonephritis versus a prostatitis.  We will      send urine for culture tonight and we will initiate Rocephin at 1      gm IV daily.   Antibiotics will need to be further tailored based      upon the results of his urine culture.  2. Significant dehydration.  Clinically, the patient is significantly      dehydrated.  We will hold all diuretics.  We will aggressively      hydrate the patient.  We will follow his clinical symptoms as well      as orthostatic blood pressures q.a.m.  3. Hyponatremia.  The patient is suffering with significant      hyponatremia.  I feel that this is likely secondary to hypovolemia.      We will volume resuscitate the patient using isotonic normal saline      and follow his sodium closely.  4. Hyperkalemia - this in fact is relatively mild.  It is likely      secondary to intercellular shift related to the patient's severe      hyperglycemia and a possible pending DKA-type scenario.  It is also      likely due to significant dehydration with ongoing use of ACE      inhibitor.  We will simply follow the patient's potassium as we      hydrate him and as we correct his severe hyperglycemia.  We can      expect a medicinal correction of his hyperglycemia will naturally      drop potassium level down as there is an intracellular shift of      serum potassium.  5. Acute renal failure - the patient is known to have normal renal      function.  He presents with significant decrease in GFR.  It is my      hope this is simply prerenal azotemia.  There is also a good chance      that the patient's renal function could be significant aggravated      by ongoing use of not only diuretic, but ACE inhibitor.  We will      hold all potential nephrotoxins and follow the patient closely.  6. Prolonged prednisone therapy - the exact indication for the      patient's ongoing  prednisone therapy is not clear.  We will      continue it without tapering it at the present time.  The patient      is displaying some initial electrolyte abnormalities that could be      concerning for adrenal insufficiency.  However, he  is not severely      tachycardic and not severely hypotensive, and therefore we will not      use a stress dose steroid at the present time.  7. Hypertension - we will hold antihypertensives we hydrate the      patient and follow the patient's blood pressure trend.  8. Hypercholesterolemia - we will continue the patient's fenofibrate      and Zocor as per his home regimen.  9. Diabetes mellitus - the patient has severely uncontrolled diabetes      mellitus.  We will initiate sliding-scale insulin as well as Lantus      as well as meal coverage insulin.  We will follow his CBGs closely.      Lonia Blood, M.D.  Electronically Signed     JTM/MEDQ  D:  04/05/2008  T:  04/05/2008  Job:  [id number]   cc:   Lajuana Matte, MD  Fax: [phone]

## 2010-08-18 NOTE — Assessment & Plan Note (Signed)
[facility] OFFICE NOTE   [name], [name]                     MRN:          [id number]  DATE:09/18/2007                            DOB:          [birth year]    PRIMARY CARE PHYSICIAN:  Burnell Blanks, MD   REASON FOR PRESENTATION:  Evaluate the patient with syncope.   HISTORY OF PRESENT ILLNESS:  The patient returns for followup of the  above.  Please refer to Aug 11, 2007, note for details.  Since that time,  the patient did wear knee-high compression stockings.  He said he wore  it for 3 weeks, but still had episodes of presyncope and syncope similar  to those described.  In fact, a couple of days ago, he fell after losing  consciousness and he has injured his right leg.  He is due to see an  orthopedist today.  He said he was walking into his office when he noted  that he was about to go down.  He only had it for a few seconds and he  lost consciousness.  There has been no chest pain or new cardiovascular  symptoms.  He stopped wearing the compression stockings.  He also saw in  TV that the [facility] program suggested the compression stockings do  not work.   PAST MEDICAL HISTORY:  1. Coronary artery disease (details not available).  2. Abdominal aortic aneurysm.  3. Hypertension.  4. Dyslipidemia.  5. Diabetes mellitus.  6. Depression.  7. Tobacco abuse.  8. Alcohol abuse.  9. 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  10.Adenocarcinoma of the rectum.  11.Cerebrovascular accident.  12.Transitional cell cancer of the bladder.  13.Abdominal coloproctectomy.  14.Abdominal aortic aneurysm, resected and grafted.  15.Arthroscopic surgery of the knee.   ALLERGIES:  Intolerance to DILAUDID, ATIVAN, FENTANYL, and PERCOCET.   MEDICATIONS:  1. Fenofibrate 134 mg daily.  2. Metformin 1000 mg b.i.d.  3. Lisinopril 10 mg daily.  4. Glipizide 2.5 mg b.i.d.  5. Paroxetine 10 mg daily.  6. Sanctura XR.  7.  Lovaza.  8. Aspirin 81 mg daily.  9. Centrum.  10.Simvastatin 40 mg.   REVIEW OF SYSTEMS:  As stated in the HPI and otherwise negative for  other systems.   PHYSICAL EXAMINATION:  The patient is in no distress.  Blood pressure  120/81 and heart rate 96 and regular.  HEENT:  Eyes are unremarkable.  Pupils are equal, round, and reactive to  light.  Fundi are not visualized.  Oral mucosa is unremarkable.  NECK:  No jugular venous distention at 45 degrees.  Carotid upstroke  brisk and symmetrical.  No bruits or thyromegaly.  LYMPHATICS:  No cervical, axillary, or inguinal adenopathy.  LUNGS:  Clear to auscultation bilaterally.  BACK:  No costovertebral angle tenderness.  HEART:  S1 and S2 within normal limits.  No S3.  No murmurs.  ABDOMEN:  Obese.  Positive bowel sounds, normal in frequency and pitch.  No rebound or guarding.  EXTREMITIES:  A 2+ pulse.  No edema.   ASSESSMENT/PLAN:  Syncope.  The patient has had further episodes.  He  says the compression stockings did not help.  At this point, I am going  to stop his lisinopril altogether as he has had documented orthostasis  and his symptoms sound like they could well be related to this.  We will  see if his symptoms improve off this.  We may have to settle for higher  lying blood pressures in order to avoid the orthostatic changes.   FOLLOWUP:  The patient will come back and be seen by one of the [place] [facility]  arrhythmia associates (either Dr. Graciela Husbands or Dr. Ladona Ridgel).     Rollene Rotunda, MD, [facility]  Electronically Signed    JH/MedQ  DD: 09/18/2007  DT: 09/18/2007  Job #: [id number]   cc:   Burnell Blanks, MD

## 2010-08-18 NOTE — Assessment & Plan Note (Signed)
[facility]                         ELECTROPHYSIOLOGY OFFICE NOTE   CAIN, FITZHENRY                     MRN:          [id number]  DATE:02/26/2008                            DOB:          Oct 18, 1940    [name] returns today for followup.  He is a very pleasant middle-  aged male with unexplained syncope status post insertion of implantable  loop recorder.  The patient did develop a stitch abscess after his loop  recorder insertion which has finally healed.  He otherwise has been  stable.  He denies chest pain or shortness of breath today.  Of course,  he has adenocarcinoma of the rectum which was diagnosed in 2007 status  post radiation and chemotherapy.  The patient has otherwise been stable.   On exam, he is a pleasant elderly man in somewhat discomfort appear in  no acute distress.  Blood pressure today was 138/85, the pulse 95 and  regular, respirations were 18, weight was 243 pounds.  Neck revealed no  jugular venous distention.  Lungs clear bilaterally to auscultation.  No  wheezes, rales, or rhonchi are present and no increased work of  breathing.  Cardiovascular exam revealed regular rate and rhythm.  Normal S1 and S2.  Extremities demonstrate no edema.  His loop recorder  insertion site was healed nicely.  He does have an ostomy bag in his  belly with a large scar which has healed from secondary intention.   Interrogation of his loop recorder demonstrates no brady or tachy  episodes.   IMPRESSION:  1. Unexplained syncope.  2. Rectal cancer.  3. Ulcerative colitis.  4. Status post loop recorder.   DISCUSSION:  Overall, [name] is stable.  I will plan to see the  patient back in 6 months for followup.     Doylene Canning. Ladona Ridgel, MD  Electronically Signed    GWT/MedQ  DD: 02/26/2008  DT: 02/27/2008  Job #: [id number]   cc:   Lianne Bushy, M.D.

## 2010-08-18 NOTE — Discharge Summary (Signed)
[name], [name] NO.:  [id number]   MEDICAL RECORD NO.:  [id number]          PATIENT TYPE:  INP   LOCATION:  1441                         FACILITY:  [facility]   PHYSICIAN:  Marcellus Scott, MD     DATE OF BIRTH:  August 03, 1940   DATE OF ADMISSION:  04/04/2008  DATE OF DISCHARGE:  04/08/2008                               DISCHARGE SUMMARY   PRIMARY MEDICAL DOCTOR:  Dr. Sharman Crate Hamrick.   ONCOLOGIST:  Dr. Clelia Croft.   DISCHARGE DIAGNOSES:  1. Dehydration.  2. Acute renal failure.  3. Hyperkalemia.  4. Uncontrolled type 2 diabetes, complicated by steroids.  5. Anemia.  6. Hypertension.  7. Normocytic anemia.   DISCHARGE MEDICATIONS:  1. NovoLog sliding scale insulin.  2. Lantus 30 units subcutaneously q.h.s.  3. NovoLog 5 units subcutaneously t.i.d. just before meals.  4. Prednisone rapid taper as per directions.  5. Omacor 1000 mg p.o. daily.  6. Simvastatin 40 mg p.o. q.h.s.  7. Centrum 1 tablet p.o. daily.  8. Paroxetine 20 mg p.o. daily.  9. Temazepam 30 mg p.o. q.h.s.  10.Tramadol/acetaminophen 37.5/325 mg p.o. p.r.n.  11.Aspirin 81 mg p.o. daily.  12.Fish oil 4 capsules p.o. daily.  13.Neurontin 300 mg p.o. b.i.d.  14.Cinnamon extract 4000 mg daily.  15.Fenofibrate 134 mg p.o. daily.   MEDICATIONS THAT ARE CURRENTLY ON HOLD:  until outpatient followup with  PMD  1. Glipizide.  2. Lisinopril.  3. Hydrochlorothiazide.  4. Metformin.  5. Doxycycline.   PROCEDURE:  Chest x-ray on April 04, 2008, no acute cardiopulmonary  abnormality.   PERTINENT LABS:  Basic metabolic panel today with potassium 4.7, BUN 20,  creatinine 1.2.  CBC yesterday with hemoglobin 10.6, hematocrit 31,  white blood cell 8.1, platelets 281.  Urine culture was 3000 colonies  and significant growth.  Hemoglobin A1c 9.  TSH 0.406.   CONSULTATIONS:  None.   HOSPITAL COURSE AND PATIENT DISPOSITION:  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  intake and feeling generally tired.  He presented to the cancer center  for lab work which showed critically elevated potassium levels.  Patient  was advised to come to hospital for admission.  Please refer to the H  and P for details of his past medical history.   1. Dehydration: which was probably a combination of poor oral intake,      diuretics, and uncontrolled diabetes.  Patient was hydrated and      this has resolved.  2. Nonoliguric, Acute Renal failure.  Again, patient's metformin, ACE      inhibitors and diuretics was held.  Patient was hydrated with IV      fluids and his renal failure has resolved.  3. Hyperkalemia: which took longer to resolve.  His ACE inhibitors      were obviously held.  Patient was given aliquots of Kayexalate.      His potassium today is normal.  He is to repeat his basic metabolic      panel in the next 3 to 4 days and follow up with his primary      medical doctor.  4. Uncontrolled type 2 diabetes mellitus.  Patient indicates that he      had been on long-term prednisone.  However, today when the same was      discussed with his wife at his bedside, they both indicate that he      was only started on prednisone recently on April 01, 2008, at 20      mg b.i.d. for acute bronchitis.  This was confirmed by the nurse      with his pharmacy.  Unfortunately, he has been on the same dose of      prednisone here. Given that he has been on a week's course of high-      dose prednisone and there is no reason for long-term prednisone,      this will be quickly tapered.  This was contributing to his      uncontrolled diabetes.  Following which, he was started on Lantus,      NovoLog, and sliding scale.  Even to date, his sugars range in the      250 mg/dL.  He will be discharged on Lantus,  sliding scale, and      NovoLog mealtime insulin.  He will be educated regarding insulin      administration, CBG checks, and monitoring and management of      hypoglycemic symptoms.  Once his steroids are tapered, his glycemic      control should improve but he might still need insulin for tight      control.  Again, patient is to follow up with his primary medical      doctor in the next 3 to 4 days.  His oral hypoglycemic agents are      currently on hold.  5. Hypertension.  Patient is normotensive on no medications.  His ACE      inhibitors were held in the hospital. Consider resuming the      lisinopril alone at a lower dose and monitoring his basic metabolic      panel.   Patient is at this time stable with no complaints, stable vital signs,  ambulating.  He will be discharged home with his family to follow up  with his primary medical doctor and oncologist.      Marcellus Scott, MD  Electronically Signed     AH/MEDQ  D:  04/08/2008  T:  04/08/2008  Job:  [id number]   cc:   Burnell Blanks, MD  Fax: [phone]   Blenda Nicely. Shadad  Fax: [phone]

## 2010-08-18 NOTE — Progress Notes (Signed)
[facility] OFFICE NOTE   RYSHAWN, SANZONE                     MRN:          [id number]  DATE:10/23/2007                            DOB:          05/16/1940    Mr. Fagin is referred today by Dr. Antoine Poche.  He is a very pleasant  middle-aged male with a multitude of medical problems including coronary  artery disease, abdominal aortic aneurysm status post repair,  hypertension, dyslipidemia, diabetes, depression, and arthritis.  The  patient has a history of dizzy spells in the past and was initially  referred to Dr. Antoine Poche by Dr. Burnell Blanks for evaluation of syncope.  His initial reflection and the description of the episodes made it sound  like these were more related to upright posture.  He notes that he has  had multiple episodes of near syncope in the past that is only one true  episode of syncope.  The spell occurred several weeks ago.  He notes  that he was unconscious for approximately 1 minute, perhaps less.  There  were no loss of bowel or bladder incontinence.  There was no  diaphoresis.  There was no nausea.  There were no obvious other  symptoms.  The patient was given compression stockings which did not  help, and he continued to feel dizzy.  Interestingly enough, the patient  does have a history of adenocarcinoma of the rectum for which he  underwent surgery with complications of infection and he now drains to a  colostomy bag.   CURRENT MEDICATIONS:  1. Fenofibrate 134 a day.  2. Metformin 1 g twice daily.  3. Lisinopril 10 mg daily.  4. Glipizide 2.5 twice daily.  5. Paroxetine 10 daily.  6. Sanctura XR.  7. Lovaza.  8. Aspirin 81 a day.  9. Multivitamin.  10.He is also on simvastatin 40 a day.   SOCIAL HISTORY:  The patient is self-employed.  He is married.  He  denies tobacco abuse presently.   FAMILY HISTORY:  Notable for father died in his 54s of a ruptured  appendix.  Mother died at 23 of dementia complications.   REST REVIEW OF SYSTEMS:  The rest of review of systems was negative,  except as noted above and occasional cough and decreased hearing.   PHYSICAL EXAMINATION:  GENERAL:  He is a pleasant chronically ill-  appearing middle-aged man in no distress.  VITAL SIGNS:  Blood pressure was 132/88, the pulse was 88 and regular,  the respirations were 18, the weight was 242 pounds.  HEENT:  Normocephalic and atraumatic.  Pupils are equal and round.  Oropharynx  is moist.  Sclerae anicteric.  NECK:  No jugular venous distention.  LUNGS:  Clear bilaterally to auscultation.  No wheezes, rales, or  rhonchi are present.  CARDIOVASCULAR:  Revealed a regular rate and rhythm.  Normal S1 and S2.  There are no murmurs, rubs, or gallops.  ABDOMEN:  Soft and nontender.  The colostomy bag was in place.  There were no evidence of any infection  around. EXTREMITIES:  Demonstrated trace peripheral edema bilaterally.   IMPRESSION:  1. Unexplained syncope.  2. Multitude medical problems including coronary artery disease and      peripheral vascular disease.   DISCUSSION:  Mr. Hattabaugh is stable, but his etiology  of  his syncope is  unclear.  It may been orthostasis, though he did not have a whole lot of  change after 5 minutes with orthostatic blood pressure checks when he  was seen by Dr. Antoine Poche last.  To this end, I have recommended head-up  tilt table testing and if his tilt table test is perfectly negative,  then we would may consideration for implantable loop recorder insertion.  This to be scheduled early as possible at convenient time.     Doylene Canning. Ladona Ridgel, MD  Electronically Signed    GWT/MedQ  DD: 10/23/2007  DT: 10/24/2007  Job #: [id number]
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
## 2010-08-18 NOTE — Consult Note (Signed)
NEW PATIENT CONSULTATION   [name], [name]  DOB:  1940-10-07                                       11/18/2008  BMWUX#:[id number]   The patient is a 70 year old male patient referred by Dr. [name] for vascular evaluation because of calcifications noted in the  blood vessels in the legs.  The patient is in need of bilateral total  knee arthroplasties and is being evaluated prior to that surgery.  He is  having significant discomfort in both legs, left worse than right knee.  He does not ambulate long distances because of his knee problems.  He  denies any history of gangrene or ischemia of the lower extremities.   PAST MEDICAL HISTORY:  1. Insulin-dependent diabetes mellitus.  2. Hypertension.  3. Hyperlipidemia.  4. Negative for coronary artery disease, COPD or stroke.   PAST SURGICAL HISTORY:  1. Right knee meniscus surgery many years ago in [place].  2. Resection and grafting of abdominal aortic aneurysm in 1992 by Dr.      Madilyn Fireman.  3. Left ankle surgery.  4. Colectomy and resection of the rectum for colorectal cancer in 2007      followed by multiple procedures for infection.   FAMILY HISTORY:  Negative for coronary artery disease, diabetes and  stroke.   SOCIAL HISTORY:  He is married, has 3 children, partially retired.  He  has not smoked in over 20 years.  He does not use alcohol.   REVIEW OF SYSTEMS:  Otherwise unremarkable.   ALLERGIES:  None known.   MEDICATIONS:  Please see health history exam.   PHYSICAL EXAMINATION:  Blood pressure 141/85, heart rate 98,  respirations 14.  General:  He is a male patient in no apparent  distress, alert and oriented x3.  Neck:  Supple, 3+ carotid pulses  palpable.  No bruits are audible.  No palpable adenopathy in the neck.  Neurologic:  Normal.  Chest:  Clear to auscultation.  Cardiovascular:  Regular rhythm.  No murmurs.  Abdomen:  Soft, nontender with no masses.  He does have a right lower  quadrant colostomy and a midline abdominal  incision which is the well healed.  He has 3+ femoral, popliteal,  dorsalis pedis and posterior tibial pulses palpable bilaterally.  Both  feet are well-perfused.   Lower extremity arterial Dopplers were normal with triphasic flow in  both lower extremities and ABIs around 100% bilaterally.  No evidence of  ischemia or significant obstruction.   I have reassured him regarding the fact that his circulation is adequate  for his bilateral knee replacement surgery.  He will return to see [place] on  a p.r.n. basis.   Quita Skye Hart [place], M.D.  Electronically Signed   JDL/MEDQ  D:  11/18/2008  T:  11/19/2008  Job:  [id number]   cc:   Claude Manges. Cleophas Dunker, M.D.

## 2010-08-21 NOTE — Discharge Summary (Signed)
[name], WALTH NO.:  [id number]   MEDICAL RECORD NO.:  [id number]          PATIENT TYPE:  INP   LOCATION:  5531                         FACILITY:  [facility]   PHYSICIAN:  Leonie Man, M.D.   DATE OF BIRTH:  Sep 17, 1940   DATE OF ADMISSION:  02/07/2006  DATE OF DISCHARGE:  04/01/2006                               DISCHARGE SUMMARY   ADMISSION DIAGNOSES:  1. Severe intermittent leg cramps, rule out seizure disorder.  2. Hypotension of questionable etiology.  3. Status post total coloproctectomy for carcinoma of the rectum.   The patient is a 70 year old man who has a history of ulcerative colitis  and carcinoma of the rectum who underwent a total coloproctectomy with  ileostomy placement.  He did fairly well postoperatively except for some  problems with narcotic medication reaction causing a severe depression.  The patient was discharged home from [facility] and returned to the  office complaining of not doing well, complaining of rectal pain and leg  weakness and spasms.  He was hospitalized for re-evaluation.  On  evaluation the patient was noted to be moderately hypotensive which was  due to volume depletion and there is an associated hyponatremia.  This  resolved on volume replacement and correction of his electrolytes.  Patient also had problems with urinary retention and he was started on  Flomax and Urecholine and began voiding spontaneously.  However, this  deteriorated requiring a urologic consultation through Dr. Patsi Sears  who felt that this might be caused by a neurogenic bladder disorder.  The patient was left with a Foley catheter in place until more  definitive testing could be done.  The patient has been having violent  lower extremity tremors which resolved soon after admission, and the  patient said that he could not control his legs and he noted that this  had occurred even before he underwent surgery.  He was seen in  consultation by  the Neurologic Service.  He had a CT scan of the head  which had shown multiple punctates, acute and chronic infarcts.  However, none of these explained his inability to move his legs.  He  underwent an EEG which was essentially normal.  During the course of the  hospitalization the patient also underwent a CT scan of the abdomen and  pelvis and then noticed to have a presacral abscess which in fact did  communicate with anterior abdominal wound.  This was treated with  percutaneous drainage and the intraabdominal wound was opened and a vac  drain was put in its place.  The repeat CT scan showed resolution of the  presacral abscess. The patient was discharged home with vac in place.  At the time of discharge his electrolytes were normal.  His diabetes was  under control and his A1c was 6.1.  His extremity strength was excellent  and his perineal wound was healing well.  The patient is discharged home  with follow up with [facility] for vac change.  He was  discharged home on Keflex 500 mg p.o. b.i.d. to be continued for 10  days.  Leonie Man, M.D.  Electronically Signed     PB/MEDQ  D:  06/01/2006  T:  06/02/2006  Job:  [id number]

## 2010-08-21 NOTE — Op Note (Signed)
[facility]  Patient:    [name], [name]               MRN: [id number] Proc. Date: 01/12/00 Adm. Date:  [id number] Disc. Date: [id number] Attending:  Tania Ade CC:         Maricela Bo, M.D., [place], [place]   Operative Report  PREOPERATIVE DIAGNOSIS: Bladder tumor and gross hematuria.  POSTOPERATIVE DIAGNOSIS:  Bladder tumor and gross hematuria.  OPERATION:  Cystourethroscopy transurethral resection of bladder tumor.  SURGEON:  Lucrezia Starch. Ovidio Hanger, M.D.  ANESTHESIA:  General endotracheal anesthesia.  ESTIMATED BLOOD LOSS: 50 cc.  TUBES:  23-French Foley.  COMPLICATIONS:  None.  INDICATIONS FOR PROCEDURE:  [name] is a very nice 70 year old white male who presented with gross hematuria.  He was subsequently found on cystourethroscopy to have a large tumor of the left trigonal area.  He had developed urinary retention and a Foley catheter passed.  He has considered all risks, benefits, and alternatives and elected to undergo resection.  Of note, he has had a Foley catheter placed in [place], [place].  PROCEDURE IN DETAIL:  The patient was placed in the supine position.  After proper general endotracheal anesthesia, he was placed in the dorsolithotomy position and prepped and draped with Betadine in a sterile fashion.  A cystourethroscopy was performed with a 17-French panendoscope.  He was noted to have moderate trilobar hypertrophy and grade 1 trabeculation, and efflux of clear urine was noted from the ureteral orifices bilaterally.  Just lateral to the left ureteral orifice was a large, approximately 2.5 cm wide tumor extending to the bladder neck region.  No other lesions were noted in the bladder.  Utilizing a AP and lateral resectoscope sheath was then placed over a Timberlake obturator, and utilizing the hot loop, the lesion was resected in its totality and submitted to pathology.  Resection was  carried in the muscle, but the ureteral orifice was carefully avoided, and a 5-French whistle tip catheter was placed in the mouth of the orifice to make sure it had not been resected.  Good hemostasis was noted to be present.  No further lesions were noted.  Inspection of bladder revealed no other lesions, and the right ureteral orifice also appeared to be normal.  The bladder was drained.  The resectoscope sheath was visually removed, and a 22-French 5 cc balloon was passed, and the bladder was noted to irrigate clear.  The patient was taken to the recovery room in stable condition. DD:  01/12/00 TD:  01/13/00 Job: [id number]

## 2010-08-21 NOTE — Assessment & Plan Note (Signed)
[facility]                           GASTROENTEROLOGY OFFICE NOTE   CALYB, MCQUARRIE                     MRN:          [id number]  DATE:11/02/2005                            DOB:          September 26, 1940    PRIMARY CARE PHYSICIAN:  Lianne Bushy, M.D.   SURGEON:  Leonie Man, M.D.   MEDICAL ONCOLOGIST:  Dr. Blenda Nicely. Shadad   GI PROBLEM LIST:  1.  Recently diagnosed UT3 N1 rectal adenocarcinoma.  No obvious metastasis      on staging CT June 2007.  CEA 2.  2.  Recently diagnosed pan ulcerative colitis, moderate in severity.      Biopsies June 2007 confirm chronic inflammatory bowel disease.  No      dysplasia on random biopsies.   INTERVAL HISTORY:  I saw Mr. Wiechman two weeks ago.  He was still having  three to four loose stools a day and bothered by urgency, so I recommended  he stay on his prednisone at its current dose, which was 20 mg a day.  There  must have been some miscommunication because he continued taking his dose  until he was out and did not call for refills, and so he has been off  prednisone now for 3-4 days.  He did not taper.   He has been bothered for at least 2-3 weeks with weakness, fatigue, his legs  giving out.  I did an MRI on him recently that showed no evidence of  neurologic metastasis.  Since then, he has started chemotherapy, has started  radiation.  He was evaluated by Dr. Clelia Croft and Dorna Bloom in oncology yesterday and  they do believe that his weakness may be related to his chemotherapy and so  they have asked him to hold his chemotherapy agent for the next week before  restarting it.  He still has 3-4 loose stools a day.  He has now been on  Lialda, the once-daily mesalamine preparation, for about two weeks now.   He slammed his finger in a door 2-3 weeks ago and it was very swollen and  tender and he saw his primary care physician this past weekend, who put him  on some kind of antibiotic, but he did not remember  the name of that  antibiotic.   CURRENT MEDICATIONS:  Aspirin, paroxetine, Crestor, hydrochlorothiazide,  amlodipine, fexofenadine, Centrum, Avapro, Metformin, prednisone (he has  been off for 3-4 days due to miscommunication), antibiotic, unclear type;  Lialda 2.4 g daily.   PHYSICAL EXAMINATION:  VITAL SIGNS:  Weight 238 pounds, blood pressure  132/70, pulse 86 - nurse thought this was irregular, but to my exam, this  was a regular rate and regular rhythm.  LUNGS:  Clear to auscultation bilaterally.  HEART:  Regular rate and rhythm.  ABDOMEN:  Soft, nontender, nondistended.  Normal bowel sounds.  EXTREMITIES:  Trace edema bilaterally.   ASSESSMENT AND PLAN:  The patient is a 70 year old man with UT3, N1 rectal  adenocarcinoma in the setting of pan-ulcerative colitis.   He stopped taking steroids rather abruptly, which was not my intention.  I  have  given him a prescription for more prednisone.  He will begin taking 10  mg once a day for the next week and then taper down to 5 mg a day for a  week, and then he will be off.  That is probably a better taper for him.  It  is not clear that his weakness recently was related to steroid withdrawal,  but that may have at least contributed.  Certainly, chemotherapy could be  making him weak, as well.  We will be discussing his case tomorrow at our  combined GI-oncology-surgery conference to discuss his case.   He will return to see me in 4 weeks and sooner, if needed.                                   Rachael Fee, MD   DPJ/MedQ  DD:  11/02/2005  DT:  11/02/2005  Job #:  [id number]   cc:   Lianne Bushy, MD  Blenda Nicely Clelia Croft  Leonie Man, MD

## 2010-08-21 NOTE — Assessment & Plan Note (Signed)
[facility]                           GASTROENTEROLOGY OFFICE NOTE   MONTARIO, ZILKA                     MRN:          [id number]  DATE:12/03/2005                            DOB:          08/06/1940    PRIMARY CARE PHYSICIAN:  Lianne Bushy, MD   PRIMARY SURGEON:  Leonie Man, MD   MEDICAL ONCOLOGIST:  Blenda Nicely. Clelia Croft, M.D.   GI PROBLEM LIST:  1.  Recently diagnosed UT3 N1 rectal adenocarcinoma.  No obvious metastasis      on staging CT June 2007.  CEA 2.  2.  Recently diagnosed pan ulcerative colitis, moderate in severity.      Biopsies June 2007 confirm chronic inflammatory bowel disease.  No      dysplasia on random biopsies.   INTERVAL HISTORY:  I last saw [name] a month ago.  Since then, he has  completed his chemotherapy and radiation treatment and is awaiting a meeting  with his surgeon to discuss the timing of his surgery.  He is completely off  of steroids now and is taking Lialda 2.4 gm daily for his ulcerative  colitis.  On that regimen, he is having 3-4 soft, formed bowel movements a  day.  No bleeding.  No dramatic diarrhea.   He is still very concerned about weakness in his lower extremities.  It is  not clear what is causing this.  Perhaps this is related to chemotherapy.   CURRENT MEDICATIONS:  Aspirin, paroxetine, Crestor, hydrochlorothiazide,  amlodipine, fexofenadine, Centrum, Avapro, Metformin, Lialda.   PHYSICAL EXAMINATION:  VITAL SIGNS:  Weight is 237 pounds.  Blood pressure  92/54, pulse 74.  CONSTITUTIONAL:  Generally well-appearing, sitting in a wheelchair.  NEUROLOGIC:  Alert and oriented x3.  ABDOMEN:  Soft, nontender, nondistended.  Normal bowel sounds.   ASSESSMENT/PLAN:  A 70 year old man with UT3, N1 rectal adenocarcinoma in  the setting of pan ulcerative colitis.  He seems to be stable from an  ulcerative colitis standpoint.  He awaits his surgery, which hopefully will  be in the next several  weeks.  He will stay on his Lialda for now.  He knows  to set himself up for an appointment with me about a month after his  surgery.  He will  very likely have problems with frequency for a while, given the fact that he  is likely to undergo a complete colectomy.                                   Rachael Fee, MD   DPJ/MedQ  DD:  12/03/2005  DT:  12/03/2005  Job #:  [id number]   cc:   Lianne Bushy, MD  Blenda Nicely Clelia Croft  Leonie Man, MD

## 2010-08-21 NOTE — Assessment & Plan Note (Signed)
[facility]                           GASTROENTEROLOGY OFFICE NOTE   GAHEL, SAFLEY                     MRN:          [id number]  DATE:10/19/2005                            DOB:          [birth year]    PRIMARY CARE PHYSICIAN:  Dr. Kathrynn Humble   SURGEON:  Dr. Leonie Man   MEDICAL ONCOLOGIST:  Dr. Clelia Croft at the [facility]   GI PROBLEM LIST:  1.  Recently diagnosed UT3 N1 rectal adenocarcinoma.  No obvious metastasis      on staging CT June 2007.  CEA 2.  2.  Recently diagnosed pan ulcerative colitis, moderate in severity.      Biopsies June 2007 confirm chronic inflammatory bowel disease.  No      dysplasia on random biopsies.   INTERVAL HISTORY:  I last saw Mr. Gruenhagen at the time of his colonoscopy  three weeks ago.  Since then he has met with oncologist and surgeon.  He has  a T3 N1 adenocarcinoma of his rectum by ultrasound and will be starting  neoadjuvant chemoradiotherapy in the next several days.   His urgency and bleeding have improved on steroids.  He has tapered from 40  mg a day down to 20 mg once a day.  He is having trouble sleeping and has  had several episode of confusion, weakness, and disorientation, and nearly  stumbling.   CURRENT MEDICATIONS:  Aspirin, paroxetine, Crestor, hydrochlorothiazide,  amlodipine, fexofenadine, Centrum, Avapro, metformin, prednisone currently  20 mg once daily.   PHYSICAL EXAMINATION:  VITAL SIGNS:  Weight is 237 pounds, blood pressure  134/84, pulse 74.  CONSTITUTIONAL:  Generally well-appearing.  NEUROLOGIC:  Alert and oriented x3.  Gross motor examination, sensory  examination normal.  ABDOMEN:  Soft, nontender, nondistended.  Normal bowel sounds.   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   First, his colitis symptoms do seem to be improving with the prednisone, but  are not yet normal.  He is  still bothered by some urgency.  I am adding  Lialda which is a once daily mesalamine preparation.  He will take 2.4 g on  a daily basis and will return to see me in two weeks.  He will keep his  prednisone at its current dose.  I would like to taper his prednisone as  soon as he will tolerate.  Since he will be receiving neoadjuvant  chemotherapy and eventually surgery, steroids can certainly complicate  things.   I spoke with Dr. Lurene Shadow today about type of surgery that he will eventually  require.  I feel that he has probably had ulcerative colitis for quite some  time and it has just finally gotten to the point of him being symptomatic.  Biopsies confirmed chronic changes of his inflammatory bowel disease.  In  this setting, a total colectomy is indicated.  Gaining local control of his  rectal adenocarcinoma with neoadjuvant therapy first seems very reasonable.  I will discuss this issue with Dr. Clelia Croft and Dr. Lurene Shadow at our next  scheduled GI cancer conference and I explained to the patient that likely he  will indeed need total colectomy following his neoadjuvant  chemoradiotherapy.                                   Rachael Fee, MD   DPJ/MedQ  DD:  10/19/2005  DT:  10/19/2005  Job #:  [id number]   cc:   Blenda Nicely. Lars Mage, MD  Leonie Man, MD

## 2010-08-21 NOTE — Consult Note (Signed)
NAMERICARDO, SCHUBACH NO.:  [id number]   MEDICAL RECORD NO.:  [id number]          PATIENT TYPE:  INP   LOCATION:  1618                         FACILITY:  [facility]   PHYSICIAN:  Lonia Blood, M.D.DATE OF BIRTH:  [birth year]   DATE OF CONSULTATION:  03/22/2006  DATE OF DISCHARGE:                                 CONSULTATION   REQUESTING PHYSICIAN:  Dr. Leonie Man.   PRIMARY CARE PHYSICIAN IN THE OUTPATIENT SETTING:  Dr. Purnell Shoemaker.   REASON FOR CONSULTATION:  Orthostatic hypotension.   HISTORY OF PRESENT ILLNESS:  [name] is a 70 year old  gentleman who was admitted to the hospital on March 21, 2006 for  ongoing care of a very complex abdominal wound due to complicated  surgery as detailed below.  His ongoing care has been provided by the  General Surgery Service.  His stay has been remarkable for a presacral  abscess which has required a percutaneous drain.  He was also noted to  be hyponatremic and hypokalemic during his hospital stay, though these  issues have been appropriately corrected.  The patient is normally on a  number of antihypertensives, as noted below, but these have recently  been on hold.  While attempting to ambulate on March 21, 2006, the  patient was found to be significantly orthostatic.  He, himself, was  suffering with significant dizziness.  Orthostatic vitals did in fact  show a change in systolic pressure from 160 while lying to 118 when  standing; this prompted consultation from Dr. Lurene Shadow to assist in  determining potential etiologies for this problem.   PAST MEDICAL HISTORY:  1. Total abdominal colectomy with cholecystectomy, January 06, 2006.      a.     Indication was carcinoma of rectum with panulcerative       colitis.      b.     Readmission for pelvic abscess was required.  2. Recent urinary retention requiring indwelling Foley by Dr.      Patsi Sears.  3. Adenocarcinoma of the low rectum, status post  resection,      chemotherapy and radiation therapy.  4. Abdominal aortic aneurysm, status post repair/grafting in 1992.  5. History of CVA in 1972 with no long-term effects.  6. Coronary artery disease with history of MI, status post PTCA x2.  7. Hypertension.  8. Hyperlipidemia.  9. Diabetes mellitus, type 2 -- reasonably controlled.  10.Depression.  11.Transitional cell cancer of the bladder, status post transurethral      resection.  12.Longstanding history of tobacco use.  13.History of alcohol abuse in amount of 6 beers per day -- recently      discontinued.  14.Status post cataract extractions bilaterally.  15.Gout.   OUTPATIENT MEDICATIONS:  1. Aggrenox.  2. Norvasc.  3. Avapro.  4. Crestor.  5. Flomax.  6. Hydrochlorothiazide.  7. Metformin.  8. Paxil.  9. Ultram.  10.Restoril.   ALLERGIES:  No known drug allergies.   INPATIENT MEDICATIONS:  The patient's current MRA is reviewed.   FAMILY HISTORY:  Noncontributory to this consultation.   SOCIAL HISTORY:  The  patient is married.  [place]  He owns a [employer].   DATA REVIEW:  Hemoglobin was 9.0 on last check.  BUN was 8 with a  creatinine of 0.7 on March 17, 2006 with a sodium of 137; sodium was,  however, significantly low at 119 on March 11, 2006, with a BUN of 26  and a creatinine of 1.1 at that time.  Hemoglobin A1c has been found to  be 5.6 during this hospital stay.   PHYSICAL EXAMINATION:  VITAL SIGNS:  Temperature 98.2, blood pressure  128/83, heart rate 67, respiratory rate 20 and O2 saturation is 98% on  room air.  This morning, orthostatic vitals were obtained revealing a  systolic of 160 while lying and 118 when standing.  GENERAL:  A well-developed, well-nourished male in no acute respiratory  distress.  NECK:  No JVD.  LUNGS:  Clear to auscultation bilaterally.  CARDIOVASCULAR:  Mildly tachycardic at approximately 100 beats per   minute without murmur, gallop or rub.  ABDOMEN:  Soft, non-distended, mildly obese.  EXTREMITIES:  No significant cyanosis, clubbing or edema in bilateral  lower extremities.   IMPRESSION AND PLAN:  1. Orthostatic hypotension.  There are multiple potential etiologies      for this patient's orthostatic hypotension.  Given his prolonged      hospitalization, it certainly could be related to simple long-term      bedrest; if this was the case, we would expect it to quickly      resolve itself with further ambulation.  I am also concerned about      the possibility of simple dehydration.  The patient is mildly      tachycardic at the time of my evaluation.  I recommend continuing      to hold blood pressure as you have.  We will hydrate the patient      modestly overnight using normal saline at 150 mL an hour.  In that      Flomax is a frequent cause of orthostatic hypotension, we will have      to hold it for now; hopefully, this will not delay his care in      regards to his urinary retention.  We will check a random serum      cortisol as well as a TSH.  We will also check electrolytes in the      morning.  2. Diabetes mellitus.  Based upon a hemoglobin A1c of 5.6, we can      safely assume that the patient's long-term diabetic care has been      adequate.  During his hospital stay, his capillary blood glucose      appears to have ranged from 65-118.  Orthostasis can be seen with      severe hypoglycemia.  We will assure that a capillary blood glucose      is obtained every morning at the same time as the patient's      orthostatics.  Otherwise, no change in the patient's treatment will      be suggested at this time.  3. Hypertension.  The patient's blood pressure is in fact somewhat on      the low side at the present time.  This likely is representative of      a significant intravascular volume depletion.  We are hydrating the     patient, as noted above.  We will check a CBC.  The  patient's      antihypertensives are being held.  4. Coronary artery disease.  In the morning, we will need to question      the patient extensively as to the possibility of complaints of      chest pain or anginal equivalents.  There is no hint of this at      this point, based upon my review of his chart.  5. Tobacco abuse/alcohol abuse.  The patient will need to be counseled      extensively as to the absolute need to discontinue abuse of both of      these substances during the light of day when he is alert and      conversant.   Please note, this consultation was carried out at approximately 1:15 in  the morning.  The patient was resting comfortably at that time and I did  not feel the need to awaken him.  I have extensively reviewed his chart.  I have examined him in a cursory manner.  I am initiating laboratory  evaluations and my partners will follow the patient in the morning when  he is better able to provide a full history.   Thank you very much for your consultation on this gentleman.  We are  happy to follow along with you.  I have written orders in the chart to  help investigate his orthostatic hypotension.      Lonia Blood, M.D.  Electronically Signed     JTM/MEDQ  D:  03/22/2006  T:  03/22/2006  Job:  [id number]

## 2010-08-21 NOTE — H&P (Signed)
[name], [name] NO.:  [id number]   MEDICAL RECORD NO.:  [id number]          PATIENT TYPE:  EMS   LOCATION:  ED                           FACILITY:  [facility]   PHYSICIAN:  Alfonse Ras, MD   DATE OF BIRTH:  1940/06/07   DATE OF ADMISSION:  03/11/2006  DATE OF DISCHARGE:                              HISTORY & PHYSICAL   ADMITTING PHYSICIAN:  Leonie Man, M.D.   HISTORY OF PRESENT ILLNESS:  Patient is a 70 year old white male who  underwent total abdominal colectomy and cholecystectomy on October 4.  This was for carcinoma of the rectum and pan ulcerative colitis.  He  subsequently had a prolonged hospital course, which required prolonged  hospitalization and then readmission for pelvic abscess.  The most  recent CAT scan of the abdomen was done on November 26, which showed  recurrent presacral fluid collection but with decreased extraluminal  air.  After readmission, the patient was discharged home; however, his  discharge summary is not available for my review on E-chart.  Patient  presents now after I spoke with his wife, and he has had about a five  day history of mid abdominal discomfort and nausea.  He denies vomiting  and continues to have normal ostomy output.  He is being evaluated in  the emergency room.  He also saw Dr. Patsi Sears today because he has had  problems with urinate retention and has an indwelling Foley catheter.   PAST MEDICAL HISTORY:  History of adenocarcinoma of the low rectum,  status post preop chemo, radiation therapy, and total coloproctectomy on  January 06, 2006.  History of cholecystectomy on January 06, 2006.  Extensive history of coronary vascular disease, including status post  abdominal aortic aneurysm repair with grafting, history of  cerebrovascular accident with no long term effects, history of coronary  artery disease with myocardial infarction, and multiple angioplasties.  Also, hypertension, hyperlipidemia,  diabetes mellitus type 2,  depression, transitional cell carcinoma of the bladder, status post  transurethral tumor resection, tobacco use with ongoing smoking of  cigars, although he denies the use of cigarettes over 30 years.  Recent  alcohol use of six pack of beer per day, although that has been limited  since his discharge from the hospital.  He has a history of ventricular  tachycardia on January 17, 2006.  Osteoarthritis with gout.  Cataract  extractions.   MEDICATIONS:  1. Aggrenox 25 mg/200 mg twice daily.  2. Amlodipine 5 mg once daily.  3. Avapro 300 mg once daily.  4. Crestor 10 mg once daily.  5. Flomax 0.4 mg a day.  6. Hydrochlorothiazide 25 mg a day.  7. Metformin 500 mg x2 twice daily.  8. Multivitamin.  9. Paroxetine.  10.Hydrochlorothiazide 20 mg once daily.  11.Temazepam 30 mg a day.  12.Tramadol/acetaminophen 37.5/32 as needed.   REVIEW OF SYSTEMS:  Positive for abdominal pain, nausea, and some  dizziness on standing and urinary retention.   PHYSICAL EXAMINATION:  GENERAL:  He is an age-appropriate but palpable  white male in minimal distress.  VITAL SIGNS:  Temperature 97.3,  heart rate 100, respiratory rate 18,  blood pressure 122/72.  HEENT:  Sclerae are anicteric.  Pupils are equal, round and reactive to  light.  He does have some cataract effect in the left eye.  NECK:  Soft and supple without thyromegaly or cervical adenopathy.  LUNGS:  Clear to auscultation and percussion x2, although he is somewhat  barrel-chested.  ABDOMEN:  A VAC in place and an ostotomy in the right lower quadrant,  which is draining stool.  He is minimally tender diffusely.  EXTREMITIES:  No clubbing, cyanosis or edema.   IMPRESSION:  Abdominal pain with a complicated past medical history and  past surgical history.   PLAN:  Admit for pain control.  CT scan of the abdomen with possible  percutaneous drainage if he has recurrent pelvic abscesses, and I will  hold off on  antibiotics until I see the results of his CBC.      Alfonse Ras, MD  Electronically Signed     KRE/MEDQ  D:  03/11/2006  T:  03/11/2006  Job:  [id number]   cc:   Leonie Man, M.D.  [address]  [address]  [place]  [place] [address]

## 2010-08-21 NOTE — Op Note (Signed)
[name], [name] NO.:  [id number]   MEDICAL RECORD NO.:  [id number]          PATIENT TYPE:  INP   LOCATION:  X007                         FACILITY:  [facility]   PHYSICIAN:  Sigmund I. Patsi Sears, M.D.DATE OF BIRTH:  [birth year]   DATE OF PROCEDURE:  04/22/2006  DATE OF DISCHARGE:                               OPERATIVE REPORT   PREOPERATIVE DIAGNOSIS:  Chronic urinary obstruction.   POSTOPERATIVE DIAGNOSIS:  Chronic urinary obstruction.   OPERATION:  1. Cystourethroscopy.  2. Lysis of penile lesions.  3. TURP.   PREPARATION:  After appropriate preanesthesia, the patient is brought to  the operating room and placed on the operating table in dorsal supine  position where general LMA anesthesia was introduced.  He is then re-  placed in dorsal lithotomy position where the pubis was prepped with  Betadine solution and draped in the usual fashion.   Review of history shows that this 70 year old male was seen in  consultation on March 22, 2006, with chronic urinary retention,  failing multiple voiding trials.  During his hospitalization in  December, the patient was noted to have a very complex abdominal wound  due to complications of surgery, including total abdominal colectomy,  cholecystectomy with carcinoma of the rectum, and pan ulcerative colitis  with multiple pelvic abscess, recurrent urinary tract infections.  He is  status post chemotherapy and radiation.  Note that he is status post  abdominal aortic aneurysm repair in 1992, history of CVA in 1972, MI,  PTCA x2, hypertension, hyperlipidemia, diabetes type 2, depression, TCC  of bladder, post TUR resection in the past, and longstanding tobacco  abuse.  The patient is now admitted for TURP.   PROCEDURE:  Examination of the penis shows that there was extensive  scarring of the glans of the penis, with respect to the foreskin.  This  is dissected, and the scarred areas were incised and dissected.   The  resectoscope was then placed in the bladder; the patient was noted to  have trabeculation, cellule formation, but no evidence of recurrent  bladder cancer was identified, and no bladder stones were seen.   Resection was begun at the 7 o'clock position and carried to the 5  o'clock position, and then from the 11 o'clock to the 7 o'clock  positions, and from the 1 to 5 o'clock positions.  The patient had some  bleeding.  It was elected place a three-way Foley catheter.  Originally,  I attempted to place the suprapubic tube but because of the complex  patient's  abdominal wound and history, I had elected to forego suprapubic tube  placement.  Three-way Foley catheter was placed to continuous irrigation  and traction.  The patient was then awakened and taken to the recovery  room in stable condition.      Sigmund I. Patsi Sears, M.D.  Electronically Signed     SIT/MEDQ  D:  04/22/2006  T:  04/22/2006  Job:  [id number]

## 2010-08-21 NOTE — Consult Note (Signed)
NAMEKENNEN, STAMMER NO.:  [id number]   MEDICAL RECORD NO.:  [id number]          PATIENT TYPE:  INP   LOCATION:  3301                         FACILITY:  [facility]   PHYSICIAN:  Lonia Blood, M.D.DATE OF BIRTH:  [birth year]   DATE OF CONSULTATION:  01/18/2006  DATE OF DISCHARGE:                                   CONSULTATION   REFERRING PHYSICIAN:  Leonie Man, M.D.   PRIMARY CARE PHYSICIAN:  Unassigned.   REASON FOR CONSULTATION:  Delirium.   HISTORY OF PRESENT ILLNESS:  Mr. Sanders Manninen. Larch is a 70 year old  gentleman with a significant past medical history as detailed below.  He was  admitted to the hospital on January 05, 2006, to the service of general  surgery to undergo a total coloproctectomy for the combined indications of  cancer of the low rectum as well as pan ulcerative colitis.  He underwent  his procedure without significant difficulty.  In the postoperative period,  the patient was alert and oriented.  He was responsive.  He was progressing  nicely.  He did require PCA Dilaudid to control his pain initially.  Approximately 36 hours ago, the patient's PCA was discontinued.  Around the  same time, the patient also began to become severely confused.  The patient  does have a recent history of alcohol abuse.  His family reports that he  drank in excess of a 6-pack everyday.  They do report, however, that he  discontinued all alcohol use approximately 2 months ago and vehemently  reported that he has not had a drop of alcohol since that time.  Phenobarbital alcohol withdrawal protocol has been initiated out of fear  that this could represent alcohol withdrawal.  Urinalysis has also been  obtained.  This is markedly positive.  It has been noted that the patient  has had a climbing white count as well.  His ileostomy has been functioning  normally without significant difficulty and has been followed by the wound  ostomy continence clinical nurse  specialist service.  The patient's blood  pressure has been well controlled.  He has not been febrile.  His O2  saturations have been favorable.  The patient has required T&A.  At the  present time the patient is lying in his hospital bed on the 3300 stepdown  unit and is significantly confused.  He is alert and interactive, but is  unable to tell me where he is or why he is here.  He does not recognize his  wife at present.  He is able to recollect specific details concerning his  job. Yesterday, in an attempt to calm the patient, he was given a dose of  Ativan.  Per nursing staff report, as well as the M.D. report, the patient  then became much more severely agitated.  The patient was dosed with  flumazenil and his increased agitation diminished and he returned to his  baseline recent delirious state.  The patient's wife says that he has not  undergone similar symptoms in the past and the patient has no baseline  history of dementia.  REVIEW OF SYSTEMS:  Comprehensive review of systems is not able to be  accomplished as the patient is significantly delirious.   PAST MEDICAL HISTORY:  1. Adenocarcinoma of the lower rectum, status post preoperative      chemotherapy and radiation therapy.  2. Pan ulcerative colitis, status post coloproctectomy on January 06, 2006.  3. Cholelithiasis, status post cholecystectomy on January 06, 2006.  4. Extensive history of vascular disease.      a.     Status post abdominal aortic aneurysm resection with grafting.      b.     History of cerebrovascular accident with no longterm effects.      c.     History of coronary artery disease with myocardial infarction.  5. Hypertension.  6. Hyperlipidemia.  7. Diabetes mellitus type 2.  8. Depression.  9. 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  10.Tobacco abuse with ongoing smoking of cigars.  11.Recent history of alcohol abuse in amount of 6-pack of beer  plus per      day.  12.Self-limited inpatient appreciated 7-beat run of ventricular      tachycardia, January 17, 2006.  13.Osteoarthritis with gout.  14.Status post cataract extractions.   OUTPATIENT MEDICATIONS:  A full list of the patient's outpatient medications  is available in the hospital chart and is reviewed by this physician.   INPATIENT MEDICATIONS:  The patient's active MAR is reviewed by this  physician.   ALLERGIES:  No known drug allergies.   FAMILY HISTORY:  The patient's family history is reviewed and is  noncontributory to this consultation.   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  He is married.  He lives  outside of [place].   DATA REVIEW:  Urinalysis is markedly positive with evidence of infection.  On January 17, 2006, chest x-ray reveals bibasilar atelectasis.  On January 17, 2006, white count is decreased today at 11.9 from previous 15.4.  Hemoglobin is decreasing at 8.4 from previous 9.3 with an MCV of 91.  Platelet count is normal. Sodium, potassium chloride, bicarb are normal.  BUN is elevated at 45, but is decreased from 50 and creatinine is at 1.3  from a previous 1.5.  Serum glucose is 92.  Magnesium and phosphorus are  normal.  AST and ALT are essentially normal.  Alkaline phosphatase is  essentially normal.  Total bilirubin is normal.  Total protein is very low  at 5.4, albumin is very low at 2.0.   PHYSICAL EXAMINATION:  VITAL SIGNS:  Temperature 98.1, blood pressure 110 to  120 over 55 to 70, heart rate 105, respiratory rate 20, O2 saturations 97%  on 1 liter per minute nasal cannula.  GENERAL:  Large, obese gentleman in no acute respiratory distress, lying  comfortably but agitated on a hospital bed.  HEENT:  Normocephalic and atraumatic.  Pupils equal, round, and reactive to light and accommodation.  Extraocular muscles intact bilaterally.  OC/OP  clear with wet  mucous membranes.  NECK:  No JVD and no lymphadenopathy.  LUNGS:  Clear to auscultation bilaterally without wheezes or rhonchi except  for very mild bibasilar crackles.  CARDIOVASCULAR:  Regular rate and rhythm with occasional ectopic beats  without murmurs, rubs, or gallops.  Normal S1 and S2.  ABDOMEN:  Large midline scar which is stable and appears to be healing well  with no  evidence of focal erythema or purulent discharge.  Bowel sounds are  positive.  A right mid to lower quadrant ileostomy is present and is  draining bilious appearing fluid with no evidence of bleeding.  The abdomen  is remarkably nontender and is not distended at present.  EXTREMITIES:  No significant cyanosis, clubbing, or edema bilateral lower  extremities with bilateral lower extremity PAS devices in place.  NEUROLOGY:  The patient moves all four extremities spontaneously and  displays 5/5 strength in all four extremities.  Cranial nerves II-XII  grossly intact bilaterally.  HE is alert, but he is not oriented to place,  time, or situation.  There is no Babinski.  He has intact sensation to touch  throughout.   IMPRESSION:  1. Delirium.  There are a multitude of potential etiologies for Mr.      Vandermeulen delirium.  This does not appear to be consistent with alcohol      withdrawal.  First of all, I am actually convinced that the patient      discontinued alcohol use 2 months ago.  Even if the patient was      continuing to drink without his families' knowledge, the fact that he      has been in the hospital since January 04, 2006, makes the appearance of      alcohol withdrawal at this late point highly unlikely.  We could be      dealing with longterm sequelae of alcohol abuse in the form of B12 and      folic acid deficiencies.  I will check for these levels.  The family      does report, however, that the patient was taking a multivitamin at      home and this makes this less likely as well.  This could be  an acute      narcotic withdrawal, though, the patient has not really been on      narcotic long enough to my knowledge to suggest such.  Nonetheless, I      will empirically cover him for this with a moderate dose of Dilaudid on      a scheduled basis.  Of most concern is the fact that the patient does      appear to have a pyelonephritis.  This is most likely the cause of his      acute delirium.  Zosyn was initiated yesterday and we have already seen      a decrease in his white blood cell count.  I will not change this      antibiotic at present, though, would favor a more narrow spectrum      antibiotic if urine culture reveals suspected enteric pathogens.      Dehydration could also be playing a role here and the patient is      receiving IV fluid as well as TNA which should help correct this.  We      could also be dealing with a sleep deprivation situation since the     patient has now gone approximately 48 hours without significant rest.      I will dose the patient with Haldol in attempts to calm him and allow      him to rest without overly sedating him.  At present there are no focal      neurologic symptoms to suggest a CNS abnormality apart from a toxic      metabolic encephalopathy.  His electrolytes are balanced  at present.      We will follow along with you with the adjustments that we have made      and if we do not see improvement within the next 48 hours, we will      consider further workup.  2. Diabetes mellitus type 2.  We will continue sliding scale insulin as      you are while the patient is in the hospital to assure that his CBG is      very well controlled.  3. Self-limited ventricular tachycardia.  I will place the patient on a      low dose standing beta blocker.  For now, we will hold his other      hypertensives as his systolic blood pressure is marginal.  We will      follow him on telemetry.  Magnesium and potassium are normal at the      present time.   4. Protein calorie malnutrition.  We will continue to administer TNA.  All      attempts will be made to improve the patient's protein stores.  5. Hyperlipidemia.  I will hold the patient's Zocor for now while he      remains dehydrated.  Once his p.o. intake has resumed, we will need to      resume his medication.  6. Normocytic anemia.  This is most likely secondary to perioperative      loss.  I would favor a hemoglobin around 10 for this patient.  We will      recheck a CBC in the morning and if he remains below 10, my suggestion      would be to type, cross, and transfuse this patient a minimum of 2      units of packed red blood cells.   Thank you very much for your consultation on this gentleman.  We are happy  to continue to follow along with you.      Lonia Blood, M.D.  Electronically Signed     JTM/MEDQ  D:  01/18/2006  T:  01/18/2006  Job:  [id number]

## 2010-08-21 NOTE — Op Note (Signed)
[name], [name] NO.:  [id number]   MEDICAL RECORD NO.:  [id number]          PATIENT TYPE:  INP   LOCATION:  2309                         FACILITY:  [facility]   PHYSICIAN:  Leonie Man, M.D.   DATE OF BIRTH:  10/07/1940   DATE OF PROCEDURE:  01/06/2006  DATE OF DISCHARGE:                                 OPERATIVE REPORT   PREOPERATIVE DIAGNOSES:  1. Carcinoma of the low rectum.  2. Panulcerative colitis.  3. Cholelithiasis.   POSTOPERATIVE DIAGNOSES:  1. Carcinoma of the low rectum.  2. Panulcerative colitis.  3. Cholelithiasis.   PROCEDURES:  1. Total coloproctectomy.  2. Cholecystectomy.   SURGEON:  Leonie Man, M.D.   ASSISTANT:  Currie Paris, M.D.   ANESTHESIA:  General.   INDICATIONS:  The patient is a 70 year old gentleman diagnosed with a low  colorectal cancer.  The patient also was noted to have panulcerative colitis  at the time of colonoscopy.  CT scan of the abdomen had been without any  evidence of metastatic disease.  Transrectal ultrasound shows what seems to  be a solitary lymph node in conjunction to the tumor in the rectum.  The  patient comes to the operating room after the risks and potential benefits  of surgery have been fully discussed, all questions answered, consent  obtained.  He received preoperative chemo-radiation.   Prophylactic preoperative antibiotic, Mefoxin 1 gram IV, was given prior to  induction.   PROCEDURE:  The patient is positioned wan a modified Miles position.  A  Foley catheter was inserted into the urinary bladder, and the abdomen and  perineum were prepped and draped to be included in a sterile operative  field.  Exploratory laparotomy carried out through a long midline incision  extending from the epigastrium to the symphysis pubis.  The patient had  undergone previous abdominal surgery for aneurysmectomy, and the old scar  cicatrix was given deepened through the skin and subcutaneous  tissues with  old fascial sutures removed in their entirety.  Subsequently adhesiolysis  was carried out, lysing adhesions to the abdominal wall and multiple  interloop adhesions.  Palpation of the liver showed no evidence of gross  metastatic disease.  Palpation within the pelvis showed no evidence of  sidewall infiltration, and the tumor could be felt deep in the pelvis.  There is no seeding of any of the peritoneal surfaces.  Following this, the  ileocolic junction was mobilized and dissection carried up along the right  retroperitoneal pelvic gutter and around the hepatic flexure.  The  gastrocolic omentum was taken between clamps and with the use of LigaSure.  Dissection was then carried to the left lateral side, where the descending  colon and splenic flexure were taken down.  Dissection was carried down all  the way to the sigmoid colon and over the pelvic brim.  The distal ileum was  divided with a GIA stapler and the colonic mesentery was serially divided  using a LigaSure.  This was taken up along the ascending colon mesentery,  across the transverse colon mesentery.  The middle colic vessels were  doubly  tied with 2-0 silk sutures.  The descending colon mesentery was taken with a  LigaSure down to the sigmoid vessels.  The dissection then carried over the  pelvic brim into the true pelvis.  Dissection along the posterior rectal  wall and within the sacral hollow allowed mobilization of the entire rectum.  The sidewalls were taken down with the LigaSure across the middle  hemorrhoidal vessels.  The bladder was retracted up and away and dissection  carried down along the bladder and rectal junction, freeing the entire  rectum down to the levator muscles.  The tumor could be felt at  approximately 3-4 cm above the levators.  This having been accomplished,  hemostasis was assured within the abdomen.  I then moved to the perineum and  a peritoneal incision was made around the anus  after securing the anal  opening with Allis clamps.  The iliococcygeal ligament was taken with the  LigaSure and the levator attachments taken down with the LigaSure around the  anus.  Anteriorly the prostatic urethra and seminal vesicles were avoided  and the specimen was then released and removed and forwarded for pathologic  evaluation.  Hemostasis was assured in the pelvis and the pelvic musculature  then closed with interrupted sutures of 0 Vicryl sutures.  I placed two  Blake drains through the buttocks into the true pelvis for drainage.  After  changing gown and gloves, I returned to the abnormal portion of the  dissection, where hemostasis was again assured within the abdomen and  pelvis, and the pelvic peritoneum was then closed with a running suture of 2-  0 Vicryl and I placed SurgiWrap into the pelvis so as to reduce the amount  of adhesions of the bowel to the pelvic floor.   Then we turned our attention to the gallbladder, which was grasped and held  with a Kelly clamp and dissection carried down the region of the  hepatoduodenal ligament with isolation of the cystic artery and cystic duct.  These were traced up and the cystic duct was traced to its gallbladder-  cystic duct junction and the cystic artery was traced to its entry into the  gallbladder wall.  These were doubly clipped and transected and the  gallbladder was then dissected free from the liver bed using electrocautery  and maintaining hemostasis throughout the course of dissection.  At the end  of dissection the liver bed was checked for hemostasis.  Additional bleeding  points were treated with electrocautery.  This having been accomplished, the  peritoneal cavity was then irrigated with multiple aliquots of normal  saline.  Sponge, instrument and sharp counts were then verified.  The  abdominal wound was then closed with a running double-stranded #1 PDS suture after ileal stoma was brought out onto the right in  the right lower  quadrant.  The ileostomy was then matured onto the skin with interrupted 3-0  Vicryl sutures.  Sterile dressings were then applied, the anesthetic  reversed, the patient removed from the operating room to the recovery room  in stable condition.  He tolerated the procedure well.      Leonie Man, M.D.  Electronically Signed     PB/MEDQ  D:  01/06/2006  T:  01/07/2006  Job:  [id number]   cc:   Lianne Bushy, M.D.

## 2010-08-21 NOTE — Consult Note (Signed)
NAMEJAMAURION, [name] NO.:  [id number]   MEDICAL RECORD NO.:  [id number]             PATIENT TYPE:   LOCATION:                                 FACILITY:   PHYSICIAN:  [name], M.D. DATE OF BIRTH:   DATE OF CONSULTATION:  DATE OF DISCHARGE:                                 CONSULTATION   SUBJECTIVE:  A 70 year old married male, seen today, for urologic  consultation because of inability to void postoperatively.  The patient  has a complex urologic history of:  1. Superficial bladder cancer x2.  2. BCG bladder wash chemotherapy ([place]--Dr. Vinie Sill).  3. Ulcerative colitis.  4. Rectal cancer.  5. Status post proctocolectomy.  6. Current presacral abscess.  7. Diabetes.  8. Hypertension.  9. Hyperlipidemia.  10.Depression.  11.Status post cholecystectomy.   MEDICATIONS:  Include:  1. Avapro.  2. Norvasc.  3. Paxil.  4. Hydrochlorothiazide.  5. Zocor.  6. Insulin.  7. Glucophage.  8. Flomax.  9. Imodium.  10.(Urecholine).   ALLERGIES:  NONE KNOWN.   TOBACCO:  None current.   ALCOHOL:  History of 6 drinks per day but none x2 months.   PAST MEDICAL/SURGICAL HISTORY:  Significant for:  1. Angioplasty x2 in 1997.  2. Cataract removal.  3. Abdominal aortic aneurysm repair in 1992.  4. Fracture of left ankle, 2003.  5. CVA in 1972.   ROS:  Occasional dysuria, no hematuria. occasional constipation. All  other systems neg.   PHYSICAL EXAM:  GENERAL:  Well-developed, oriented, white male in no  acute distress.  VITALS:  Blood pressure 138/62, respiratory rate is 17, temperature  98.9.  NECK:  Supple, nontender.  CHEST:  Clear to P and A.  ABDOMEN:  Postoperative:  The patient has drains in place.  RECTAL:  There is no rectum for examination (oversewn).  EXTREMITIES:  Show no cyanosis or edema.  There is a wound VAC in place  for the drainage of the abscess.   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  I have discussed  therapy with the patient as the family noticed creatinine is 1.4 and  negative urinalysis.  He has failed multiple voiding trials, despite  adequate therapy with alpha blockers, and urecholine .   PLAN:  1. Leave Foley in place for now.  2. Wife to call to schedule:  A:  [facility].  B:  Follow up with Dr.      [name] that same day.  The patient will need cystoscopy (has      been 1 year since his last cystoscopy for bladder cancer).  Options      for treatment include  suprapubic tube, with followup physical      therapy, to try to regain voiding ability.      [name], M.D.  Electronically Signed     SIT/MEDQ  D:  02/25/2006  T:  02/25/2006  Job:  [id number]   cc:   Leonie [name], M.D.

## 2010-08-21 NOTE — Procedures (Signed)
CLINICAL HISTORY:  The patient is a 70 year old with episodes of increased  weakness.  The patient has had a history of colorectal cancer.  He has had 1-  2 minute episodes of involuntary movement of his legs without evidence of  loss of consciousness.  Study is being done to look for presence of  seizures.  (781.0)   PROCEDURE:  The tracing is carried out on 32-channel digital Cadwell  recorder, re-formatted into 16 channel montages with one devoted to EKG.  The patient was briefly awake and asleep during the rest of the record.   Background activity in the waking state was rhythmic 5-7 Hz, 10-15 microvolt  activity.  The patient becomes drowsy and drifts into natural sleep with  mixed frequency, predominant delta range activity, vertex sharp waves and  fragmentary sleep spindles.  The patient is aroused during photic  stimulation, shows a dominant frequency of 8 Hz and amplitudes of 20  microvolts.   Intermittent photic stimulation induced a driving response of 5, 7 a 9 Hz.  There was no focal slowing or interictal epileptiform activity in the form  of spikes or sharp waves.   IMPRESSION:  Awake, drowsy and asleep, this record is normal.  No seizure  activity was seen.  No motion artifact related to the leg movements and  concern was seen, either.   8 Hz, 15-microvolt activity, with considerable muscle and motion artifact.   IMPRESSION:  Essentially normal record with the patient initially drowsy,  asleep and finally awake.      Deanna Artis. Sharene Skeans, M.D.  Electronically Signed     WJX:BJYN  D:  02/10/2006 12:01:07  T:  02/10/2006 22:39:17  Job #:  [id number]   cc:   Bevelyn Buckles. Nash Shearer, M.D.  Fax: [phone]

## 2010-08-21 NOTE — Op Note (Signed)
NAME:  [name], [name] NO.:  [id number]   MEDICAL RECORD NO.:  [id number]                   PATIENT TYPE:  INP   LOCATION:  5020                                 FACILITY:  [facility]   PHYSICIAN:  Claude Manges. Cleophas Dunker, M.D.            DATE OF BIRTH:  1940-10-22   DATE OF PROCEDURE:  08/17/2003  DATE OF DISCHARGE:                                 OPERATIVE REPORT   PREOPERATIVE DIAGNOSIS:  Grade 2 open lateral ankle dislocation with  evulsion of anterior and medial ankle ligaments associated with cartilage  evulsion of mid talar dome and a displaced distal tibia (lateral malleolar  fracture).   POSTOPERATIVE DIAGNOSIS:  Grade 2 open lateral ankle dislocation with  evulsion of anterior and medial ankle ligaments associated with cartilage  evulsion of mid talar dome and a displaced distal tibia (lateral malleolar  fracture).   PROCEDURE:  Open reduction, internal fixation of lateral malleolar fracture.  I&D of open wound medially with repair of anterior and lateral ankle  ligament.   SURGEON:  Dr. Cleophas Dunker   ASSISTANT:  Oneida Alar, PA-C   ANESTHESIA:  General orotracheal.   COMPLICATIONS:  None.   DESCRIPTION OF PROCEDURE:  With the patient comfortable on the operating  room table and under general orotracheal anesthesia, the left lower  extremity was placed in thigh tourniquet.  The leg was then prepped with  Betadine scrub and then Betadine solution from the knee to the tips of his  toes.  Sterile draping was performed.  With the extremity elevated, it was  esmarched and exsanguinated with the proximal tourniquet at 350 mmHg.   The patient sustained probably a 2.5 inch laceration transversely along the  medial malleolus, extending from the medial border of the Achilles tendon to  anterior to the ankle joint.  There was also about the size of a quarter  with considerable skin abrasion that was directly juxtaposed to the lateral  extent of the  laceration.  In the emergency room, the ankle was reduced by  the ER physician.  There was no gross contamination, but the anterior  capsule and deltoid ligament had been completely evulsed from the medial  malleolus.  There was no fracture of the medial malleolus.  There was a  large area of cartilage lost in the mid talar dome and several pieces or  articular cartilage were loose in the joint.  Copious irrigation of the  wound was then performed with jet saline lavage and antibiotic solution and  again with saline, using probably 8000-9000 mL of fluid.   The anterior capsule and deltoid ligaments were repaired with 0 Vicryl.  The  posterior tibial tendon was intact.  The neurovascular bundle posteriorly  appeared to be intact.  Fascia was closed with 2-0 Vicryl such that the  ankle felt perfectly stable.  His ankle was very loose prior to repair of  the ligaments.  The skin was closed  with skin clips over a Penrose drain,  and I debrided the edges very carefully.   The incision was then made probably 8 cm from the medial laceration over the  distal fibula and via sharp dissection carried in the subcutaneous tissue.  By blunt dissection, the soft tissue was elevated over the fibula fracture.  There appeared to be either a __________ or perhaps a branch of the peroneal  nerve that had been lacerated and was trapped in the fracture.  This was  debrided.  Under direct visualization, the fracture was then reduced.  A 7-  hole semi-tubular plate was then affixed to the lateral malleolus, bending  it to conform to the shape of the fibula.  Four holes were filled with self-  tapping cortical screws proximal to the fracture.  The distal three holes  were filled with cancellous screws to the distal two screw holes and a  cortical screw of the more proximal of the three distal screw holes.  I had  excellent position of the fracture.  __________ revealed anatomic reduction  of the ankle with normal  ankle mortise, and the fracture was essentially  obliterated by the plate and screws.  The wound was the copiously irrigated  with saline solution.  The fascia was closed with 2-0 and 3-0 Vicryl and the  skin closed with skin clips.  Sterile bulky dressing was applied followed by  posterior and U-splints.  Tourniquet was deflated with immediate capillary  refill of the toes.  The patient tolerated the procedure without  complications.                                               Claude Manges. Cleophas Dunker, M.D.    PWW/MEDQ  D:  08/17/2003  T:  08/18/2003  Job:  [id number]

## 2010-08-21 NOTE — Discharge Summary (Signed)
NAMEJAIDEEP, POLLACK NO.:  [id number]   MEDICAL RECORD NO.:  [id number]          PATIENT TYPE:  INP   LOCATION:  1415                         FACILITY:  [facility]   PHYSICIAN:  Leonie Man, M.D.   DATE OF BIRTH:  February 28, 1941   DATE OF ADMISSION:  04/22/2006  DATE OF DISCHARGE:  04/24/2006                               DISCHARGE SUMMARY   ADMISSION DIAGNOSES:  1. 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  2. Urinary retention.  3. Adenocarcinoma of rectum.  4. Status post abdominal aortic aneurysm with resection and grafting.  5. History of cerebrovascular accident in 26.  6. Coronary artery disease.  7. History of myocardial infarction, status post percutaneous      transluminal coronary angioplasty x2.  8. Hypertension.  9. Dyslipidemia.  10.Diabetes type 2.  11.Depression.  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  13.History of longstanding tobacco use.  14.History of alcohol abuse.  15.Gouty arthritis.   DISCHARGE DIAGNOSES:  1. 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  2. Urinary retention.  3. Adenocarcinoma of rectum.  4. Status post abdominal aortic aneurysm with resection and grafting.  5. History of cerebrovascular accident in 31.  6. Coronary artery disease.  7. History of myocardial infarction, status post percutaneous      transluminal coronary angioplasty x2.  8. Hypertension.  9. Dyslipidemia.  10.Diabetes type 2.  11.Depression.  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  13.History of longstanding tobacco use.  14.History of alcohol abuse.  15.Gouty arthritis.   PROCEDURES IN THE HOSPITAL:  Percutaneous drainage of presacral abscess  by interventional radiology.   CONSULTATIONS:  1. Dr. Audrea Muscat for urinary retention with transfer to the      [facility] with urodynamics done as an  outpatient.      Findings consistent with benign prostatic hypertrophy.  2. General medicine consultation by Dr. Sharon Seller for orthostatic      hypotension believed to be due to mild simple dehydration, blood      pressure medications, and immobility.   HOSPITAL COURSE:  The patient is admitted to the hospital complaining of  intractable pain in his pelvis.  He is having urinary retention.  He has  a wound vac in place for an abdominal wound and a draining pelvic wound.  He is admitted to the hospital and after CT scan, he had the left-sided  transgluteal drain placed for drainage.  Throughout the course of  hospitalization, the patient did develop some more static hypertension,  which was treated with holding his blood pressure medications and  increasing his fluid intake.  During the course of hospitalization, due  to the immobility, he was started on fairly aggressive physical therapy  with some improvement of his mobility.  There was no evidence of adrenal  insufficiency from evidence and from a.m. and p.m. cortisols.      Leonie Man, M.D.  Electronically Signed     PB/MEDQ  D:  06/11/2006  T:  06/11/2006  Job:  [id number] 

## 2010-08-21 NOTE — Consult Note (Signed)
[name], [name] NO.:  [id number]   MEDICAL RECORD NO.:  [id number]          PATIENT TYPE:  INP   LOCATION:  5531                         FACILITY:  [facility]   PHYSICIAN:  Bevelyn Buckles. Champey, M.D.DATE OF BIRTH:  11/22/1940   DATE OF CONSULTATION:  DATE OF DISCHARGE:                                   CONSULTATION   REQUESTING PHYSICIAN:  Leonie Man, M.D.   REASON FOR CONSULTATION:  Lower extremity shaking and spasms, rule out  seizure.   HISTORY OF PRESENT ILLNESS:  Mr. Buckbee is a 70 year old Caucasian male with  multiple medical problems who presents for colon and rectal cancer, status  post surgery on February 06, 2006.  The patient has had multiple  complications including pain and infection.  He has been complaining of  bilateral lower extremity spasms and shaking for over the past year, and he  describes this as rocking sensations of both lower extremities, more on the  left than the right.  He denies any pain.  His last episode was 2 days ago.  This seems to occur intermittently, and the frequency varies from 0 in two  weeks to every other day.  His last episode was 2 days ago, and episodes  usually last 1-2 minutes in duration without any evidence of loss or  impairment of consciousness.  The patient denies any triggers.  There is no  involvement of the upper extremities.  This is involuntary as he states he  cannot stop it when it occurs.  He denies any back pain, incontinence or  tongue biting.  He has complaints of generalized weakness and unsteadiness  on his feet and denies any headaches, focal weakness, numbness, vision  changes, speech changes, dizziness, vertigo, falls or loss of consciousness.  The patient does have a history of excessive alcohol use in the past,  however states his last drink was over 4 months ago.   PAST MEDICAL HISTORY:  1. Hypertension.  2. Hyperlipidemia.  3. Diabetes.  4. Depression.  5. Alcohol use.  6.  Cancer.  7. Colitis.  8. Status post colectomy.  9. Status post cholecystectomy.   CURRENT MEDICATIONS:  Avapro, Norvasc, Paxil, hydrochlorothiazide, Zocor,  insulin, Glucophage, Flomax and Imodium.   ALLERGIES:  No known drug allergies.   FAMILY HISTORY:  Noncontributory.   SOCIAL HISTORY:  The patient lives with his wife.  Denies any smoking.  Last  alcohol drink was 4-5 months ago.   REVIEW OF SYSTEMS:  Positive as per HPI.  Review of systems is negative as  per HPI in greater than seven other organ systems.   PHYSICAL EXAMINATION:  VITAL SIGNS:  His temperature is 97.7, pulse is 101,  respirations 18, blood pressure is 112/72, O2 saturation is 93-99%.  HEENT:  Normocephalic, atraumatic.  Extraocular muscles are intact.  NECK:  Supple.  No carotid bruits.  HEART:  Regular.  LUNGS:  Clear.  ABDOMEN:  Soft.  EXTREMITIES:  Good pulses.  NEUROLOGIC:  The patient is awake, alert and oriented x3.  Language is  fluent.  Memory is within normal limits.  Cranial nerves  II-XII are grossly  intact.  Motor examination shows 4+ to 5/5 strength and normal tone in all 4  extremities.  No drift is noted.  Sensory examination is within normal  limits to light touch.  Reflexes are trace throughout.  Toes are neutral  laterally.  Cerebellar function is within normal limits to finger-to-nose.  Gait is not assessed secondary to safety.   CT of the head done on February 07, 2006, showed no acute abnormality.   LABORATORY DATA:  WBC is 9.9, hemoglobin 8.6, hematocrit is 25.3, platelets  500.  Sodium was 137, potassium 4.2, chloride is 104, CO2 is 26, BUN 7.  creatinine is 1.1, glucose 134, calcium is 9.0.  magnesium is 1.7.   IMPRESSION:  This is a 70 year old with multiple medical problems who has  bilateral lower extremity spasms and shaking for over the past year, which  per intermittently.   Recommend checking an EEG to rule out seizure activity.  However, this seems  less likely given  the patient does not have any impairment or loss of  consciousness with symptoms.  Also check an MRI of the brain given the  patient's history to rule out any structural lesions.  Also question whether  patient's history of alcohol use could be contributing, possibly withdrawal;  however, the patient states that the patient's last drink was over 4 months  ago.  Will consider doing an MRI of the lumbar spine if the other studies  are normal to rule out any structural lesions that could be contributing.  Will follow the patient while he is in hospital.      Bevelyn Buckles. Nash Shearer, M.D.  Electronically Signed     DRC/MEDQ  D:  02/09/2006  T:  02/10/2006  Job:  [id number]

## 2010-08-21 NOTE — Discharge Summary (Signed)
NAMEOMAIR, DETTMER NO.:  [id number]   MEDICAL RECORD NO.:  [id number]          PATIENT TYPE:  INP   LOCATION:  1618                         FACILITY:  [facility]   PHYSICIAN:  Leonie Man, M.D.   DATE OF BIRTH:  05/02/1940   DATE OF ADMISSION:  03/11/2006  DATE OF DISCHARGE:  03/25/2006                               DISCHARGE SUMMARY   ADMISSION DIAGNOSES:  1. Recurrent pelvic abscess.  2. Abdominal wall infection on VAC treatment.  3. Hypertension.  4. Diabetes mellitus.   DISCHARGE DIAGNOSES:  1. Recurrent pelvic abscess.  2. Abdominal wall infection on VAC drainage.  3. Orthostatic hypertension.  4. Diabetes mellitus.   PROCEDURES/TREATMENTS IN HOSPITAL:  1. CT-guided percutaneous drainage of pelvic abscess.  2. CT fistulograms x2 with no evidence of fistula.  3. Wound care to abdominal and perineal wounds.  4. Medical evaluation for orthostatic hypertension by Dr. Sharon Seller,      consultant, resulted in discontinuation of antihypertensive.  The      a.m. and p.m. cortisols were normal.  TSH was normal.  Zosyn      antibiotics.  Cultures grew out Bacteroides.   On discharge, his condition is improved.  Followup will be as follows:  Home health RN for care of the [facility] and perineal wound and pelvic drain  care.  We will hold his Norvasc and hydrochlorothiazide.  I will  continue him on Avapro.  He will have a follow-up CT in one week from  discharge.  We will see him back in the office in one week.      Leonie Man, M.D.  Electronically Signed     PB/MEDQ  D:  06/11/2006  T:  06/11/2006  Job:  [id number]

## 2010-08-21 NOTE — Op Note (Signed)
NAMEPHELAN, SCHADT NO.:  [id number]   MEDICAL RECORD NO.:  [id number]          PATIENT TYPE:  INP   LOCATION:  5531                         FACILITY:  [facility]   PHYSICIAN:  Leonie Man, M.D.   DATE OF BIRTH:  15-Nov-1940   DATE OF PROCEDURE:  02/24/2006  DATE OF DISCHARGE:  03/02/2006                               OPERATIVE REPORT   PREOPERATIVE DIAGNOSIS:  Abdominal wall abscess.   POSTOPERATIVE DIAGNOSIS:  Abdominal wall abscess.   PROCEDURES:  Incision and drainage and debridement abdominal wall  abscess with application of Wound-V.A.C.   SURGEON:  Leonie Man, M.D.   SPECIMENS TO LAB:  Cultures, aerobic and anaerobic.   ESTIMATED BLOOD LOSS:  Minimal.   COMPLICATIONS:  None.   INDICATIONS:  Mr. Silguero is a 70 year old man who is status post  abdominal perineal resection for a low-lying rectal cancer, who has  subsequently developed an abdominal wall abscess, which has been  multiply drained by percutaneous drainage.  Because of this persistent  drainage from his abdominal wall abscess, it is planned to take him back  to the operating room for more definitive drainage.   PROCEDURE:  Following the induction of satisfactory general anesthesia,  the patient is positioned supinely and the abdomen is prepped and draped  to be included in a sterile operative field.  The incision is reopened  and cultures for aerobic and anaerobic taken.  The wound was then  thoroughly irrigated with pulsatile suction irrigator.  A Wound-V.A.C.  is then applied into the depths of the wound and placed on suction.  The  patient removed from the operating room back to the recovery room in  stable condition.  He tolerated the procedure well.      Leonie Man, M.D.  Electronically Signed     PB/MEDQ  D:  07/15/2006  T:  07/15/2006  Job:  [id number]

## 2010-08-21 NOTE — Op Note (Signed)
NAME:  [name], [name] NO.:  [id number]   MEDICAL RECORD NO.:  [id number]                   PATIENT TYPE:  OIB   LOCATION:  2874                                 FACILITY:  [facility]   PHYSICIAN:  Hewitt Blade, D.D.S.             DATE OF BIRTH:  1940/06/12   DATE OF PROCEDURE:  03/04/2003  DATE OF DISCHARGE:  03/04/2003                                 OPERATIVE REPORT   PREOPERATIVE DIAGNOSES:  1. Maxillary and mandibular nonrestorable teeth numbers 2, 3, 4, 5, 6, 11,     12, 13, 14, 21, 22, 23, 24, 25, 26, 27, and 28.  2. History of severe coronary artery disease.  3. History of aortic aneurysm.  4. History of diabetes.   POSTOPERATIVE DIAGNOSES:  1. Maxillary and mandibular nonrestorable teeth numbers 2, 3, 4, 5, 6, 11,     12, 13, 14, 21, 22, 23, 24, 25, 26, 27, and 28.  2. History of severe coronary artery disease.  3. History of aortic aneurysm.  4. History of diabetes.   SURGERY PERFORMED:  Total odontectomy.   SURGEON:  Hewitt Blade, D.D.S.   FIRST ASSISTANT:  Brewer   ANESTHESIA:  General via oro-endotracheal intubation.   ESTIMATED BLOOD LOSS:  Negligible.   FLUIDS REPLACED:  Approximately 1000 cc crystalloid solutions.   COMPLICATIONS:  None apparent.   INDICATIONS FOR PROCEDURE:  Mr. Allen is a 70 year old gentleman who was  referred to my office for removal of his remaining dentition.  The patient  presented with severe dental decay and advanced periodontal disease.  More  significantly, the patient has a history of coronary artery disease, aortic  aneurysms, and diabetes.  Due to his significant cardiac status, it was  recommended strongly that his procedure be performed in an operating room  setting.   DETAILS OF PROCEDURE:  On March 04, 2003, Mr. Lampkins was taken to [facility] main operating suite where he was placed on the operating room  table in a supine position.  Following successful oro-endotracheal  intubation and general anesthesia, the patient's face, neck, and oral cavity  were prepped and draped in the usual sterile operating room fashion.  The  hypopharynx was suctioned free of fluids and secretions and a moistened 2  inch vaginal pack was placed as a throat pack.  Attention was then directed  intraorally, where approximately 5 cc of 0.5% Xylocaine containing 1:200,000  epinephrine were infiltrated in the maxillary buccal and lingual tissues  adjacent to the maxillary teeth and the right and left inferior alveolar  nerve distributions.  Attention was then directed towards the maxillary  arch, where a #9 Molt periosteal elevator was used to reflect a full-  thickness flap around the necks of the maxillary dentition.  The teeth were  then subluxated from the alveolus using an 11A elevator and removed from the  oral cavity using a 150 dental forcep.  Alveoloplasties were  then performed  using a small osseous file.  The surgical sites were then thoroughly  irrigated and suctioned.  The mucoperiosteal margins were then approximated  and sutured using 4-0 chromic suture material on an FS2 needle.  In similar  fashion, the remaining mandibular teeth were removed as well.  The oral  cavity was then thoroughly irrigated and suctioned.  The throat pack was  removed and the hypopharynx suctioned free of fluids and secretions.  The  patient was then allowed to awaken from the anesthesia and taken to the  recovery room where he appeared to have tolerated the procedure well without  apparent complication.                                               Hewitt Blade, D.D.S.    DC/MEDQ  D:  03/04/2003  T:  03/04/2003  Job:  [id number]

## 2010-08-21 NOTE — Discharge Summary (Signed)
NAME:  [name], [name]   PHYSICIAN:  Claude Manges. Cleophas Dunker, M.D.            DATE OF BIRTH:  1940-06-30   DATE OF ADMISSION:  08/17/2003  DATE OF DISCHARGE:  08/23/2003                                 DISCHARGE SUMMARY   ADMITTING DIAGNOSES:  1. Open left ankle fracture.  2. Coronary artery disease.  3. Hypertension.  4. Diabetes mellitus.  5. History of abdominal aneurysm repair.  6. Seasonal allergies.  7. History of gout.  8. History of bladder cancer.  9. Hyperlipidemia.  10.      Osteoarthritis of bilateral knees.  11.      Daily ethanol use.   DISCHARGE DIAGNOSES:  1. Status post open reduction, internal fixation of a lateral malleolar     fracture and incision and drainage of an open wound medially with repair     of anterior and lateral ankle ligaments of the left ankle.  2. Ethanol withdrawal symptoms causing confusion and agitation during the     hospital stay.  3. Diabetes mellitus type 2.  4. Coronary artery disease.  5. Hypertension.  6. History of gout.  7. History of bladder cancer.  8. Hyperlipidemia.  9. Osteoarthritis of bilateral knees.  10.      History of abdominal aneurysm repair.  11.      Seasonal allergies.   HISTORY OF PRESENT ILLNESS:  [name] is a 70 year old male who was  stepping out of his truck when he twisted his leg.  The left knee gave way  causing his left ankle to twist.  The patient was seen in the [facility]  Emergency Room where he was found to have an open left ankle fracture.  The  patient was admitted to undergo surgery.   ALLERGIES:  No known drug allergies.   MEDICATIONS:  1. Neurontin 300 mg q. day.  2. Allegra 180 mg q. day.  3. Metformin 500 mg four tablets q.a.m.  4. Colchicine 0.6 mg p.r.n.  5. Avapro 300 mg p.o. q. day.  6. Paxil  CR 12.5 mg q. day.  7. Norvasc 5 mg p.o. q. day.  8. Aspirin q. day.  9. Multivitamin q. day.  10.      HCTZ 12.5 mg p.o. q. day.   SURGICAL PROCEDURE:  The patient was taken to the operating room on Aug 17, 2003 by Dr. [name], assisted by __________.  The patient was placed  under general anesthesia and an open reduction, internal fixation of the  lateral malleolar fracture of the left ankle was performed.  Also performed  was an I&D of the open wound medially with repair of the anterior and  lateral ankle ligaments.  The patient tolerated the procedure well and  returned  to recovery in good, stable condition.   CONSULTATIONS:  The following consults were obtained while the patient was  hospitalized:  1. PT.  2. OT.  3. Pharmacy.  4. [employer].   HOSPITAL COURSE:  Postoperatively the patient did develop some confusion and  agitation due to alcohol withdrawal.  The patient was on the Ativan protocol  with vitamin B1 at this time.  Otherwise, the patient progressed slowly with  physical therapy.  He was found to have expiratory wheezing bilaterally.  However, this is improving at the time of discharge.  The patient was  afebrile throughout the hospital stay.  Vital signs remained stable.   LABORATORIES:  CBC on admission:  White count was 10,100, hemoglobin 11.8,  hematocrit 32.7, platelets 218.  Coags on admission were all within normal  limits.  Routine chemistries on admission:  Sodium 128, chloride 95, glucose  117.  Otherwise, all routine chemistry values were within normal limits.  Hepatic enzymes on admission:  AST 28, ALT 15, alkaline phosphatase 66,  total bilirubin 0.9.   X-RAYS:  1. Three views of the left ankle were performed on Aug 17, 2003 and showed a     fibular fracture, suspicious for small fracture along the posterior     aspect of the distal tibia, interrupted widen ankle joint.  2. Left knee, four views performed, with no acute fracture  noted.  Prominent     degenerative changes in the joint of the __________ were noted.  3. Chest x-ray performed on Aug 17, 2003 showed poor inspiratory portable     chest x-ray without evidence of infiltrate or congestive heart failure.  4. Left ankle two views postoperative performed on Aug 17, 2003 showed ORIF     of the distal left fibular fracture with mortise intact.   DISCHARGE INSTRUCTIONS:  1. The patient is to resume preoperative medications.  2. The patient is told no alcohol.  3. The patient is to add the following medication:     A. Percocet 5/325 take one to two tablets every four to six hours as        needed for pain.   ACTIVITY:  Bed and nonweightbearing on the left leg with a walker.   DIET:  No restrictions.  Again, no alcohol.   WOUND CARE:  The patient is to keep the site clean and dry.  Notify Dr.  Cleophas Dunker if temperature is greater than 101.5, chills, pain not relieved by  pain medicines, or foul smelling drainage.   FOLLOW UP:  1. The patient needs to follow-up with Dr. Cleophas Dunker approximately one week     from discharge.  The patient is to call the office at [phone] for     appointment.  2. The patient is to follow-up with his primary care physician 10 to 14 days     after discharge to discuss alcohol abuse.   CONDITION ON DISCHARGE:  The patient was discharged home in good, stable  condition.  At the time of discharge, the patient was afebrile and vital  signs were stable.  CBGs at that time were running 134 to 190.      Richardean Canal, P.A.                       Claude Manges. Cleophas Dunker, M.D.    GC/MEDQ  D:  09/30/2003  T:  10/01/2003  Job:  [id number]   cc:   Claude Manges. Cleophas Dunker, M.D.  [phone]  [place]  [place] [address]  Fax: [phone]   Jethro Bolus, M.D.  [place], [place]

## 2010-09-24 ENCOUNTER — Encounter: Payer: Self-pay | Admitting: Cardiovascular Disease

## 2010-09-24 DIAGNOSIS — K5190 Ulcerative colitis, unspecified, without complications: Secondary | ICD-10-CM | POA: Insufficient documentation

## 2011-01-06 LAB — GLUCOSE, CAPILLARY: Glucose-Capillary: 258 — ABNORMAL HIGH

## 2011-01-07 LAB — URINE MICROSCOPIC-ADD ON

## 2011-01-07 LAB — URINALYSIS, ROUTINE W REFLEX MICROSCOPIC
Bilirubin Urine: NEGATIVE
Glucose, UA: >1000 mg/dL — AB
Ketones, ur: NEGATIVE mg/dL
Protein, ur: NEGATIVE mg/dL
pH: 5.5 (ref 5.0–8.0)

## 2011-01-07 LAB — CBC
HCT: 32.8 % — ABNORMAL LOW (ref 39.0–52.0)
MCHC: 34.2 g/dL (ref 30.0–36.0)
MCV: 91.2 fL (ref 78.0–100.0)
Platelets: 434 10*3/uL — ABNORMAL HIGH (ref 150–400)

## 2011-01-07 LAB — BASIC METABOLIC PANEL
BUN: 54 mg/dL — ABNORMAL HIGH (ref 6–23)
Calcium: 9.7 mg/dL (ref 8.4–10.5)
Creatinine, Ser: 2.06 mg/dL — ABNORMAL HIGH (ref 0.4–1.5)
GFR calc non Af Amer: 32 mL/min — ABNORMAL LOW (ref >60)
Glucose, Bld: 375 mg/dL — ABNORMAL HIGH (ref 70–99)
Potassium: 6.1 mEq/L — ABNORMAL HIGH (ref 3.5–5.1)

## 2011-01-07 LAB — DIFFERENTIAL
Basophils Relative: 0 % (ref 0–1)
Eosinophils Absolute: 0 10*3/uL (ref 0.0–0.7)
Eosinophils Relative: 0 % (ref 0–5)
Lymphs Abs: 1.3 10*3/uL (ref 0.7–4.0)
Neutrophils Relative %: 86 % — ABNORMAL HIGH (ref 43–77)

## 2011-01-07 LAB — GLUCOSE, CAPILLARY: Glucose-Capillary: 438 mg/dL — ABNORMAL HIGH (ref 70–99)

## 2011-01-07 LAB — MAGNESIUM: Magnesium: 2.2 mg/dL (ref 1.5–2.5)

## 2011-06-04 ENCOUNTER — Emergency Department (HOSPITAL_COMMUNITY): Payer: Medicare Other

## 2011-06-04 ENCOUNTER — Other Ambulatory Visit (HOSPITAL_COMMUNITY): Payer: Self-pay | Admitting: Pharmacy Technician

## 2011-06-04 ENCOUNTER — Inpatient Hospital Stay (HOSPITAL_COMMUNITY)
Admission: EM | Admit: 2011-06-04 | Discharge: 2011-06-08 | DRG: 203 | Disposition: A | Discharge status: Home / Self Care | Payer: Medicare Other | Attending: Internal Medicine | Admitting: Internal Medicine

## 2011-06-04 ENCOUNTER — Other Ambulatory Visit: Payer: Self-pay

## 2011-06-04 ENCOUNTER — Encounter (HOSPITAL_COMMUNITY): Payer: Self-pay | Admitting: Emergency Medicine

## 2011-06-04 DIAGNOSIS — E119 Type 2 diabetes mellitus without complications: Secondary | ICD-10-CM

## 2011-06-04 DIAGNOSIS — F1011 Alcohol abuse, in remission: Secondary | ICD-10-CM | POA: Diagnosis present

## 2011-06-04 DIAGNOSIS — Z794 Long term (current) use of insulin: Secondary | ICD-10-CM

## 2011-06-04 DIAGNOSIS — R0989 Other specified symptoms and signs involving the circulatory and respiratory systems: Secondary | ICD-10-CM | POA: Diagnosis present

## 2011-06-04 DIAGNOSIS — R0609 Other forms of dyspnea: Secondary | ICD-10-CM | POA: Diagnosis present

## 2011-06-04 DIAGNOSIS — J40 Bronchitis, not specified as acute or chronic: Principal | ICD-10-CM | POA: Diagnosis present

## 2011-06-04 DIAGNOSIS — E1129 Type 2 diabetes mellitus with other diabetic kidney complication: Secondary | ICD-10-CM | POA: Diagnosis present

## 2011-06-04 DIAGNOSIS — R0789 Other chest pain: Secondary | ICD-10-CM | POA: Diagnosis present

## 2011-06-04 DIAGNOSIS — I2510 Atherosclerotic heart disease of native coronary artery without angina pectoris: Secondary | ICD-10-CM

## 2011-06-04 DIAGNOSIS — N400 Enlarged prostate without lower urinary tract symptoms: Secondary | ICD-10-CM | POA: Diagnosis present

## 2011-06-04 DIAGNOSIS — I129 Hypertensive chronic kidney disease with stage 1 through stage 4 chronic kidney disease, or unspecified chronic kidney disease: Secondary | ICD-10-CM | POA: Diagnosis present

## 2011-06-04 DIAGNOSIS — I10 Essential (primary) hypertension: Secondary | ICD-10-CM | POA: Diagnosis present

## 2011-06-04 DIAGNOSIS — R0902 Hypoxemia: Secondary | ICD-10-CM

## 2011-06-04 DIAGNOSIS — E875 Hyperkalemia: Secondary | ICD-10-CM | POA: Diagnosis present

## 2011-06-04 DIAGNOSIS — E7800 Pure hypercholesterolemia, unspecified: Secondary | ICD-10-CM | POA: Diagnosis present

## 2011-06-04 DIAGNOSIS — N183 Chronic kidney disease, stage 3 unspecified: Secondary | ICD-10-CM | POA: Diagnosis present

## 2011-06-04 DIAGNOSIS — E1322 Other specified diabetes mellitus with diabetic chronic kidney disease: Secondary | ICD-10-CM | POA: Diagnosis present

## 2011-06-04 DIAGNOSIS — E1165 Type 2 diabetes mellitus with hyperglycemia: Secondary | ICD-10-CM | POA: Diagnosis present

## 2011-06-04 DIAGNOSIS — R079 Chest pain, unspecified: Secondary | ICD-10-CM | POA: Diagnosis present

## 2011-06-04 DIAGNOSIS — D649 Anemia, unspecified: Secondary | ICD-10-CM | POA: Diagnosis present

## 2011-06-04 HISTORY — DX: Other specified diabetes mellitus with hyperglycemia: E13.65

## 2011-06-04 HISTORY — DX: Alcohol abuse, in remission: F10.11

## 2011-06-04 HISTORY — DX: Other specified diabetes mellitus with diabetic chronic kidney disease: E13.22

## 2011-06-04 HISTORY — DX: Chronic kidney disease, stage 3 (moderate): N18.3

## 2011-06-04 LAB — DIFFERENTIAL
Lymphocytes Relative: 11 % — ABNORMAL LOW (ref 12–46)
Lymphs Abs: 0.8 10*3/uL (ref 0.7–4.0)
Monocytes Absolute: 0.2 10*3/uL (ref 0.1–1.0)
Monocytes Relative: 3 % (ref 3–12)
Neutro Abs: 5.9 10*3/uL (ref 1.7–7.7)
Neutrophils Relative %: 82 % — ABNORMAL HIGH (ref 43–77)

## 2011-06-04 LAB — BASIC METABOLIC PANEL
BUN: 22 mg/dL (ref 6–23)
CO2: 24 mEq/L (ref 19–32)
Chloride: 99 mEq/L (ref 96–112)
Creatinine, Ser: 1.26 mg/dL (ref 0.50–1.35)
GFR calc Af Amer: 64 mL/min — ABNORMAL LOW (ref >90)
Glucose, Bld: 220 mg/dL — ABNORMAL HIGH (ref 70–99)
Potassium: 4 mEq/L (ref 3.5–5.1)

## 2011-06-04 LAB — CBC
HCT: 33 % — ABNORMAL LOW (ref 39.0–52.0)
Hemoglobin: 11.4 g/dL — ABNORMAL LOW (ref 13.0–17.0)
RBC: 3.6 MIL/uL — ABNORMAL LOW (ref 4.22–5.81)

## 2011-06-04 MED ORDER — ALBUTEROL SULFATE (5 MG/ML) 0.5% IN NEBU
5.0000 mg | INHALATION_SOLUTION | Freq: Once | RESPIRATORY_TRACT | Status: AC
  Administered 2011-06-04: 5 mg via RESPIRATORY_TRACT
  Filled 2011-06-04: qty 1

## 2011-06-04 MED ORDER — ALBUTEROL SULFATE (5 MG/ML) 0.5% IN NEBU
2.5000 mg | INHALATION_SOLUTION | RESPIRATORY_TRACT | Status: DC
  Filled 2011-06-04: qty 0.5

## 2011-06-04 MED ORDER — ASPIRIN 81 MG PO CHEW
324.0000 mg | CHEWABLE_TABLET | Freq: Once | ORAL | Status: AC
  Administered 2011-06-04: 324 mg via ORAL
  Filled 2011-06-04: qty 4

## 2011-06-04 MED ORDER — IPRATROPIUM BROMIDE 0.02 % IN SOLN
RESPIRATORY_TRACT | Status: AC
  Filled 2011-06-04: qty 2.5

## 2011-06-04 MED ORDER — ONDANSETRON HCL 4 MG/2ML IJ SOLN: 4.0000 mg | Freq: Three times a day (TID) | INTRAMUSCULAR | Status: AC | PRN

## 2011-06-04 MED ORDER — SODIUM CHLORIDE 0.9 % IV SOLN
INTRAVENOUS | Status: AC
  Administered 2011-06-04: via INTRAVENOUS

## 2011-06-04 MED ORDER — METHYLPREDNISOLONE SODIUM SUCC 125 MG IJ SOLR
INTRAMUSCULAR | Status: AC
  Filled 2011-06-04: qty 2

## 2011-06-04 MED ORDER — MORPHINE SULFATE 4 MG/ML IJ SOLN
4.0000 mg | Freq: Once | INTRAMUSCULAR | Status: AC
  Administered 2011-06-04: 4 mg via INTRAVENOUS
  Filled 2011-06-04: qty 1

## 2011-06-04 MED ORDER — ALBUTEROL SULFATE (5 MG/ML) 0.5% IN NEBU
INHALATION_SOLUTION | RESPIRATORY_TRACT | Status: AC
  Administered 2011-06-04: 5 mg via RESPIRATORY_TRACT
  Filled 2011-06-04: qty 2

## 2011-06-04 NOTE — ED Provider Notes (Signed)
History     CSN: [id number]  Arrival date & time 06/04/11  1939   First MD Initiated Contact with Patient 06/04/11 2040      Chief Complaint  Patient presents with  . Shortness of Breath    short of breath recent dx of bronchitis    (Consider location/radiation/quality/duration/timing/severity/associated sxs/prior treatment) Patient is a 71 y.o. male presenting with shortness of breath. The history is provided by the patient. No language interpreter was used.  Shortness of Breath  The current episode started 5 to 7 days ago. The onset was gradual. The problem occurs continuously. The problem has been gradually worsening. The problem is moderate. The symptoms are relieved by nothing. The symptoms are aggravated by activity. Associated symptoms include chest pain, cough and shortness of breath. Pertinent negatives include no orthopnea, no fever, no rhinorrhea, no sore throat and no wheezing. The cough is dry. There is no color change associated with the cough. Nothing relieves the cough. The cough is worsened by activity. He has not inhaled smoke recently. Steroid use: states has allergy to steroids.    Past Medical History  Diagnosis Date  . Gout   . Depression   . Diabetes mellitus   . Unspecified essential hypertension   . Hypercholesteremia     IIA  . CAD (coronary artery disease)   . Normocytic anemia     Chronic  . Colitis, ulcerative     NOS  . Personal hx-rectal/anal malignancy   . Urinary incontinence   . Benign prostatic hypertrophy     Past Surgical History  Procedure Date  . Colon resection   . Transurethral resection of prostate     No family history on file.  History  Substance Use Topics  . Smoking status: Unknown If Ever Smoked  . Smokeless tobacco: Not on file  . Alcohol Use: No      Review of Systems  Constitutional: Negative for fever, chills, activity change, appetite change and fatigue.  HENT: Positive for congestion. Negative for sore  throat, rhinorrhea, neck pain and neck stiffness.   Respiratory: Positive for cough and shortness of breath. Negative for wheezing.   Cardiovascular: Positive for chest pain. Negative for palpitations and orthopnea.  Gastrointestinal: Negative for nausea, vomiting and abdominal pain.  Genitourinary: Negative for dysuria, urgency, frequency and flank pain.  Musculoskeletal: Negative for myalgias, back pain and arthralgias.  Neurological: Negative for dizziness, weakness, light-headedness, numbness and headaches.  All other systems reviewed and are negative.    Allergies  Hydromorphone hcl; Lorazepam; and Oxycodone-acetaminophen  Home Medications   Current Outpatient Rx  Name Route Sig Dispense Refill  . ASPIRIN 81 MG PO TABS Oral Take 81 mg by mouth daily.      Marland Kitchen FISH OIL OIL Oral Take 1 tablet by mouth daily.      . INSULIN ASPART 100 UNIT/ML [place] SOLN Subcutaneous Inject 14-16 Units into the skin See admin instructions. Breakfast 16 units; Lunch 20 units;  Dinner 14 units    . INSULIN GLARGINE 100 UNIT/ML [place] SOLN Subcutaneous Inject 48 Units into the skin at bedtime.     Marland Kitchen VICTOZA [place] Subcutaneous Inject 1.2 mg into the skin daily.    Marland Kitchen ONE-DAILY MULTI VITAMINS PO TABS Oral Take 1 tablet by mouth daily.      Marland Kitchen PAROXETINE HCL 20 MG PO TABS Oral Take 20 mg by mouth daily.      Marland Kitchen CINNAMON 500 MG PO CAPS Oral Take 500 mg by mouth daily.    Marland Kitchen  EZETIMIBE-SIMVASTATIN 10-40 MG PO TABS Oral Take 1 tablet by mouth at bedtime.    Marland Kitchen LOSARTAN POTASSIUM 100 MG PO TABS Oral Take 100 mg by mouth daily.    Marland Kitchen NAPROXEN SODIUM 220 MG PO TABS Oral Take 220 mg by mouth 2 (two) times daily with a meal.      BP 127/65  Pulse 100  Temp(Src) 98.6 F (37 C) (Oral)  Resp 18  SpO2 92%  Physical Exam  Nursing note and vitals reviewed. Constitutional: He is oriented to person, place, and time. He appears well-developed and well-nourished.  HENT:  Head: Normocephalic and atraumatic.  Mouth/Throat: Oropharynx  is clear and moist.  Eyes: Conjunctivae and EOM are normal. Pupils are equal, round, and reactive to light.  Neck: Normal range of motion. Neck supple.  Cardiovascular: Normal rate, regular rhythm, normal heart sounds and intact distal pulses.  Exam reveals no gallop and no friction rub.   No murmur heard. Pulmonary/Chest: Effort normal. No respiratory distress. He has wheezes.  Abdominal: Soft. Bowel sounds are normal. There is no tenderness.  Musculoskeletal: Normal range of motion. He exhibits no edema and no tenderness.  Neurological: He is alert and oriented to person, place, and time. No cranial nerve deficit.  Skin: Skin is warm and dry. No rash noted.    ED Course  Procedures (including critical care time)   Date: 06/04/2011  Rate: 97  Rhythm: normal sinus rhythm  QRS Axis: normal  Intervals: normal  ST/T Wave abnormalities: normal  Conduction Disutrbances:none  Narrative Interpretation:   Old EKG Reviewed: unchanged  Labs Reviewed  CBC - Abnormal; Notable for the following:    RBC 3.60 (*)    Hemoglobin 11.4 (*)    HCT 33.0 (*)    Platelets 146 (*)    All other components within normal limits  DIFFERENTIAL - Abnormal; Notable for the following:    Neutrophils Relative 82 (*)    Lymphocytes Relative 11 (*)    All other components within normal limits  BASIC METABOLIC PANEL - Abnormal; Notable for the following:    Sodium 134 (*)    Glucose, Bld 220 (*)    GFR calc non Af Amer 55 (*)    GFR calc Af Amer 64 (*)    All other components within normal limits  POCT I-STAT TROPONIN I   Dg Chest 2 View  06/04/2011  *RADIOLOGY REPORT*  Clinical Data: Chest pain, shortness of breath, diabetes.  History of coronary artery disease.  CHEST - 2 VIEW  Comparison: 01/23/2009  Findings: Heart size is accentuated by the AP position of the patient.  Lungs are clear.  No edema. Visualized osseous structures have a normal appearance.  IMPRESSION: Negative exam.  Original Report  Authenticated By: Patterson Hammersmith, M.D.     1. Chest pain   2. Hypoxia   3. Bronchitis       MDM  Chest pain with multiple risk factors. His heart score of greater than 2 given the numerous risk factors for coronary artery disease. He also has wheezing with increased oxygen requirement. He is not require oxygen at home. He received 2 breathing treatments without resolution of his hypoxia. He is continued wheezing. Patient refused steroid treatment. States he has a history of adverse reaction. Remainder of his workup was relatively unremarkable. He did receive morphine, aspirin.        Dayton Bailiff, MD 06/04/11 2248

## 2011-06-04 NOTE — ED Notes (Signed)
[id number]<YN> Expected date:<BR> Expected time: 7:38 PM<BR> Means of arrival:<BR> Comments:<BR> M61 - 70yoM SOB, tx for bronchitis last few wks by pcp

## 2011-06-05 ENCOUNTER — Encounter (HOSPITAL_COMMUNITY): Payer: Self-pay | Admitting: Family Medicine

## 2011-06-05 DIAGNOSIS — E1322 Other specified diabetes mellitus with diabetic chronic kidney disease: Secondary | ICD-10-CM

## 2011-06-05 DIAGNOSIS — D649 Anemia, unspecified: Secondary | ICD-10-CM | POA: Diagnosis present

## 2011-06-05 DIAGNOSIS — IMO0002 Reserved for concepts with insufficient information to code with codable children: Secondary | ICD-10-CM

## 2011-06-05 DIAGNOSIS — J40 Bronchitis, not specified as acute or chronic: Secondary | ICD-10-CM | POA: Diagnosis present

## 2011-06-05 DIAGNOSIS — R079 Chest pain, unspecified: Secondary | ICD-10-CM | POA: Diagnosis present

## 2011-06-05 DIAGNOSIS — E875 Hyperkalemia: Secondary | ICD-10-CM | POA: Diagnosis present

## 2011-06-05 HISTORY — DX: Other specified diabetes mellitus with diabetic chronic kidney disease: E13.22

## 2011-06-05 HISTORY — DX: Reserved for concepts with insufficient information to code with codable children: IMO0002

## 2011-06-05 LAB — CBC
HCT: 30.5 % — ABNORMAL LOW (ref 39.0–52.0)
MCHC: 35.4 g/dL (ref 30.0–36.0)
MCV: 90.5 fL (ref 78.0–100.0)
RDW: 13.7 % (ref 11.5–15.5)

## 2011-06-05 LAB — BASIC METABOLIC PANEL
BUN: 22 mg/dL (ref 6–23)
CO2: 23 mEq/L (ref 19–32)
Calcium: 9.3 mg/dL (ref 8.4–10.5)
Chloride: 100 mEq/L (ref 96–112)
Creatinine, Ser: 1.2 mg/dL (ref 0.50–1.35)
GFR calc Af Amer: 68 mL/min — ABNORMAL LOW (ref >90)

## 2011-06-05 LAB — TROPONIN I
Troponin I: <0.3 ng/mL (ref <0.30)
Troponin I: <0.3 ng/mL (ref <0.30)

## 2011-06-05 LAB — LIPID PANEL: LDL Cholesterol: 55 mg/dL (ref 0–99)

## 2011-06-05 LAB — GLUCOSE, CAPILLARY
Glucose-Capillary: 258 mg/dL — ABNORMAL HIGH (ref 70–99)
Glucose-Capillary: 290 mg/dL — ABNORMAL HIGH (ref 70–99)
Glucose-Capillary: 255 mg/dL — ABNORMAL HIGH (ref 70–99)
Glucose-Capillary: 294 mg/dL — ABNORMAL HIGH (ref 70–99)

## 2011-06-05 MED ORDER — SIMVASTATIN 40 MG PO TABS
40.0000 mg | ORAL_TABLET | Freq: Every day | ORAL | Status: DC
  Administered 2011-06-05 – 2011-06-07 (×3): 40 mg via ORAL
  Filled 2011-06-05 (×4): qty 1

## 2011-06-05 MED ORDER — MORPHINE SULFATE 4 MG/ML IJ SOLN
INTRAMUSCULAR | Status: AC
  Filled 2011-06-05: qty 1

## 2011-06-05 MED ORDER — ASPIRIN 325 MG PO TABS
325.0000 mg | ORAL_TABLET | Freq: Every day | ORAL | Status: DC
  Administered 2011-06-05 – 2011-06-08 (×4): 325 mg via ORAL
  Filled 2011-06-05 (×4): qty 1

## 2011-06-05 MED ORDER — BENZONATATE 100 MG PO CAPS
100.0000 mg | ORAL_CAPSULE | Freq: Three times a day (TID) | ORAL | Status: DC | PRN
  Filled 2011-06-05: qty 1

## 2011-06-05 MED ORDER — INSULIN ASPART 100 UNIT/ML ~~LOC~~ SOLN
0.0000 [IU] | Freq: Three times a day (TID) | SUBCUTANEOUS | Status: DC
  Administered 2011-06-05 – 2011-06-06 (×2): 11 [IU] via SUBCUTANEOUS
  Administered 2011-06-06 (×2): 15 [IU] via SUBCUTANEOUS
  Administered 2011-06-07: 20 [IU] via SUBCUTANEOUS
  Administered 2011-06-07 (×2): 11 [IU] via SUBCUTANEOUS
  Administered 2011-06-08: 4 [IU] via SUBCUTANEOUS
  Administered 2011-06-08: 11 [IU] via SUBCUTANEOUS
  Filled 2011-06-05: qty 3

## 2011-06-05 MED ORDER — TEMAZEPAM 15 MG PO CAPS
30.0000 mg | ORAL_CAPSULE | Freq: Every day | ORAL | Status: DC
  Administered 2011-06-05 – 2011-06-08 (×4): 30 mg via ORAL
  Filled 2011-06-05 (×3): qty 2
  Filled 2011-06-05 (×2): qty 1

## 2011-06-05 MED ORDER — INSULIN ASPART 100 UNIT/ML ~~LOC~~ SOLN
0.0000 [IU] | Freq: Three times a day (TID) | SUBCUTANEOUS | Status: DC
  Administered 2011-06-05 (×2): 8 [IU] via SUBCUTANEOUS
  Filled 2011-06-05: qty 3

## 2011-06-05 MED ORDER — METHYLPREDNISOLONE SODIUM SUCC 125 MG IJ SOLR
60.0000 mg | Freq: Three times a day (TID) | INTRAMUSCULAR | Status: DC
  Administered 2011-06-04: 20:00:00 via INTRAVENOUS
  Administered 2011-06-05 – 2011-06-07 (×5): 60 mg via INTRAVENOUS
  Filled 2011-06-05 (×9): qty 0.96

## 2011-06-05 MED ORDER — LOSARTAN POTASSIUM 50 MG PO TABS
100.0000 mg | ORAL_TABLET | Freq: Every day | ORAL | Status: DC
  Administered 2011-06-05: 100 mg via ORAL
  Filled 2011-06-05: qty 2

## 2011-06-05 MED ORDER — NITROGLYCERIN 2 % TD OINT
0.5000 [in_us] | TOPICAL_OINTMENT | Freq: Four times a day (QID) | TRANSDERMAL | Status: DC
  Administered 2011-06-05 – 2011-06-08 (×12): 0.5 [in_us] via TOPICAL
  Filled 2011-06-05: qty 30

## 2011-06-05 MED ORDER — ALBUTEROL SULFATE (5 MG/ML) 0.5% IN NEBU: 2.5000 mg | INHALATION_SOLUTION | RESPIRATORY_TRACT | Status: DC | PRN

## 2011-06-05 MED ORDER — INSULIN ASPART 100 UNIT/ML ~~LOC~~ SOLN
6.0000 [IU] | Freq: Three times a day (TID) | SUBCUTANEOUS | Status: DC
  Administered 2011-06-05 – 2011-06-06 (×2): 6 [IU] via SUBCUTANEOUS

## 2011-06-05 MED ORDER — PAROXETINE HCL 20 MG PO TABS
20.0000 mg | ORAL_TABLET | Freq: Every day | ORAL | Status: DC
  Administered 2011-06-05 – 2011-06-08 (×4): 20 mg via ORAL
  Filled 2011-06-05 (×4): qty 1

## 2011-06-05 MED ORDER — INSULIN ASPART 100 UNIT/ML ~~LOC~~ SOLN
0.0000 [IU] | Freq: Every day | SUBCUTANEOUS | Status: DC
  Administered 2011-06-05 – 2011-06-07 (×3): 3 [IU] via SUBCUTANEOUS

## 2011-06-05 MED ORDER — INSULIN ASPART 100 UNIT/ML ~~LOC~~ SOLN: 0.0000 [IU] | Freq: Every day | SUBCUTANEOUS | Status: DC

## 2011-06-05 MED ORDER — EZETIMIBE-SIMVASTATIN 10-40 MG PO TABS: 1.0000 | ORAL_TABLET | Freq: Every day | ORAL | Status: DC

## 2011-06-05 MED ORDER — IPRATROPIUM BROMIDE 0.02 % IN SOLN
0.5000 mg | RESPIRATORY_TRACT | Status: DC | PRN
  Administered 2011-06-04: 21:00:00 via RESPIRATORY_TRACT
  Administered 2011-06-07: 0.5 mg via RESPIRATORY_TRACT
  Filled 2011-06-05 (×2): qty 2.5

## 2011-06-05 MED ORDER — NITROGLYCERIN 2 % TD OINT
0.5000 [in_us] | TOPICAL_OINTMENT | Freq: Four times a day (QID) | TRANSDERMAL | Status: DC
  Administered 2011-06-05 (×3): 0.5 [in_us] via TOPICAL
  Filled 2011-06-05 (×2): qty 30

## 2011-06-05 MED ORDER — MORPHINE SULFATE 4 MG/ML IJ SOLN
4.0000 mg | Freq: Once | INTRAMUSCULAR | Status: AC
  Administered 2011-06-05: 4 mg via INTRAVENOUS

## 2011-06-05 MED ORDER — ALBUTEROL SULFATE (5 MG/ML) 0.5% IN NEBU
2.5000 mg | INHALATION_SOLUTION | RESPIRATORY_TRACT | Status: DC | PRN
  Administered 2011-06-05 – 2011-06-07 (×2): 2.5 mg via RESPIRATORY_TRACT
  Filled 2011-06-05 (×2): qty 0.5

## 2011-06-05 MED ORDER — INSULIN GLARGINE 100 UNIT/ML ~~LOC~~ SOLN
48.0000 [IU] | Freq: Every day | SUBCUTANEOUS | Status: DC
  Administered 2011-06-05: 48 [IU] via SUBCUTANEOUS
  Filled 2011-06-05: qty 3

## 2011-06-05 MED ORDER — IPRATROPIUM BROMIDE 0.02 % IN SOLN: 0.5000 mg | RESPIRATORY_TRACT | Status: DC | PRN

## 2011-06-05 MED ORDER — ENOXAPARIN SODIUM 60 MG/0.6ML ~~LOC~~ SOLN
55.0000 mg | SUBCUTANEOUS | Status: DC
  Administered 2011-06-05: 55 mg via SUBCUTANEOUS
  Administered 2011-06-06: 12:00:00 via SUBCUTANEOUS
  Administered 2011-06-07 – 2011-06-08 (×2): 55 mg via SUBCUTANEOUS
  Filled 2011-06-05 (×4): qty 0.6

## 2011-06-05 NOTE — H&P (Signed)
PCP:   Dr. [facility]   Chief Complaint:  Chest pain  HPI: This is a 71 year old gentleman with remote history of coronary artery disease with stent placement in 1992. Earlier this month he had bronchitis. This week he started having some of the same symptoms of cough, wheezing, feeling weak, frontal headache and pain across the precordium. He states he is been "coughing and sneezing like crazy". Today he was sitting on the porch when he developed sudden left-sided chest pains, there was no radiation. He then developed severe shortness of breath. He states the pain was initially intermittent then became constant. It was not worse with movement. Reports no palpitation no diaphoresis. He does state he was wheezing. He denies GERD, he states he occasionally gets some mild chest pains. 911 was called and was brought to the ER. He does report some low grade and continued to chest pains left-sided. His last stress test was greater than 3 years ago. History provided by the patient was alert and oriented. Patient declines steroid stating he is allergic reaction to this.  Review of Systems: Positives bolded   anorexia, fever, weight loss,, vision loss, decreased hearing, hoarseness, chest pain, syncope, dyspnea on exertion, peripheral edema, balance deficits, hemoptysis, abdominal pain, melena, hematochezia, severe indigestion/heartburn, hematuria, incontinence, genital sores, muscle weakness, suspicious skin lesions, transient blindness, difficulty walking, depression, unusual weight change, abnormal bleeding, enlarged lymph nodes, angioedema, and breast masses.  Past Medical History: Past Medical History  Diagnosis Date  . Gout   . Depression   . Diabetes mellitus   . Unspecified essential hypertension   . Hypercholesteremia     IIA  . CAD (coronary artery disease)   . Normocytic anemia     Chronic  . Colitis, ulcerative     NOS  . Personal hx-rectal/anal malignancy   . Urinary  incontinence   . Benign prostatic hypertrophy    Past Surgical History  Procedure Date  . Colon resection   . Transurethral resection of prostate     Medications: Prior to Admission medications   Medication Sig Start Date End Date Taking? Authorizing Provider  aspirin 81 MG tablet Take 81 mg by mouth daily.     Yes Historical Provider, MD  Fish Oil OIL Take 1 tablet by mouth daily.     Yes Historical Provider, MD  insulin aspart (NOVOLOG) 100 UNIT/ML injection Inject 14-16 Units into the skin See admin instructions. Breakfast 16 units; Lunch 20 units;  Dinner 14 units   Yes Historical Provider, MD  insulin glargine (LANTUS) 100 UNIT/ML injection Inject 48 Units into the skin at bedtime.    Yes Historical Provider, MD  Liraglutide (VICTOZA [place]) Inject 1.2 mg into the skin daily.   Yes Historical Provider, MD  Multiple Vitamin (MULTIVITAMIN) tablet Take 1 tablet by mouth daily.     Yes Historical Provider, MD  PARoxetine (PAXIL) 20 MG tablet Take 20 mg by mouth daily.     Yes Historical Provider, MD  Cinnamon 500 MG capsule Take 500 mg by mouth daily.    Historical Provider, MD  ezetimibe-simvastatin (VYTORIN) 10-40 MG per tablet Take 1 tablet by mouth at bedtime.    Historical Provider, MD  losartan (COZAAR) 100 MG tablet Take 100 mg by mouth daily.    Historical Provider, MD  naproxen sodium (ANAPROX) 220 MG tablet Take 220 mg by mouth 2 (two) times daily with a meal.    Historical Provider, MD    Allergies:   Allergies  Allergen  Reactions  . Hydromorphone Hcl   . Lorazepam   . Oxycodone-Acetaminophen     Social History:  has an unknown smoking status. He does not have any smokeless tobacco history on file. He reports that he does not drink alcohol. His drug history not on file.  Family History: No family history on file.  Physical Exam: Filed Vitals:   06/04/11 2215 06/04/11 2233 06/04/11 2330 06/04/11 2345  BP:   160/74 164/71  Pulse: 99     Temp:      TempSrc:        Resp: 15  12 22   SpO2: 93% 92%      General:  Alert and oriented times three, well developed and nourished, no acute distress Eyes: PERRLA, pink conjunctiva, no scleral icterus ENT: Moist oral mucosa, neck supple, no thyromegaly Lungs: clear to ascultation, no wheeze, no crackles, no use of accessory muscles Cardiovascular: regular rate and rhythm, no regurgitation, no gallops, no murmurs. No carotid bruits, no JVD Abdomen: soft, positive BS, non-tender, non-distended, no organomegaly, not an acute abdomen GU: not examined Neuro: CN II - XII grossly intact, sensation intact Musculoskeletal: strength 5/5 all extremities, no clubbing, cyanosis or edema, no reproducible chest wall pain Skin: no rash, no subcutaneous crepitation, no decubitus Psych: appropriate patient   Labs on Admission:   [facility] 06/04/11 2116  NA 134*  K 4.0  CL 99  CO2 24  GLUCOSE 220*  BUN 22  CREATININE 1.26  CALCIUM 9.7  MG --  PHOS --   No results found for this basename: AST:2,ALT:2,ALKPHOS:2,BILITOT:2,PROT:2,ALBUMIN:2 in the last 72 hours No results found for this basename: LIPASE:2,AMYLASE:2 in the last 72 hours  Basename 06/04/11 2116  WBC 7.2  NEUTROABS 5.9  HGB 11.4*  HCT 33.0*  MCV 91.7  PLT 146*   No results found for this basename: CKTOTAL:3,CKMB:3,CKMBINDEX:3,TROPONINI:3 in the last 72 hours No components found with this basename: POCBNP:3 No results found for this basename: DDIMER:2 in the last 72 hours No results found for this basename: HGBA1C:2 in the last 72 hours No results found for this basename: CHOL:2,HDL:2,LDLCALC:2,TRIG:2,CHOLHDL:2,LDLDIRECT:2 in the last 72 hours No results found for this basename: TSH,T4TOTAL,FREET3,T3FREE,THYROIDAB in the last 72 hours No results found for this basename: VITAMINB12:2,FOLATE:2,FERRITIN:2,TIBC:2,IRON:2,RETICCTPCT:2 in the last 72 hours Results for HAIDER, HORNADAY (MRN [id number]) as of 06/05/2011 00:31  Ref. Range 06/04/2011 21:22   Troponin i, poc Latest Range: 0.00-0.08 ng/mL 0.03   Micro Results: No results found for this or any previous visit (from the past 240 hour(s)).   Radiological Exams on Admission: Dg Chest 2 View  06/04/2011  *RADIOLOGY REPORT*  Clinical Data: Chest pain, shortness of breath, diabetes.  History of coronary artery disease.  CHEST - 2 VIEW  Comparison: 01/23/2009  Findings: Heart size is accentuated by the AP position of the patient.  Lungs are clear.  No edema. Visualized osseous structures have a normal appearance.  IMPRESSION: Negative exam.  Original Report Authenticated By: Patterson Hammersmith, M.D.   EKG normal sinus rhythm  Assessment/Plan Present on Admission:  Chest pains  .CAD, UNSPECIFIED SITE, history of stent placement  Admit to telemetry observation status Aspirin daily, nitroglycerin ordered  Cycle cardiac enzymes and lipid panel in a.m. Continue home medications Patient may benefit from a stress test  Bronchitis Nebulizers ordered  .BENIGN PROSTATIC HYPERTROPHY .DM .GOUT .HX, PERSONAL, MALIGNANCY, RECTUM/ANUS .HYPERCHOLESTEROLEMIA  IIA .HYPERTENSION, UNSPECIFIED Stable resume home medications  Full code DVT prophylaxis Team 4/Dr. Lorel Monaco, Karlin Binion 06/05/2011, 12:31 AM

## 2011-06-05 NOTE — Progress Notes (Signed)
Patient complained of chest pains with pain rate of 3/10 and is getting worse per patient radiating from left to right chest and to the neck, patient is also wheezing  and strongly coughing at this time. V/S checked BP 172/94 HR 104, RR 28, 98% at 2L O2  via [place]. Dr. Darnelle Catalan paged ,made aware of above gave orders, RT  was also paged and gave treatment  which helped with the breathing.  was also given Morphine 4mg  IV x1dose. verbalized relief  and feeling better. Will endorsed accordingly.

## 2011-06-05 NOTE — Progress Notes (Signed)
PROGRESS NOTE  MASEN LUALLEN WUJ:[id number] DOB: [birth year] DOA: 06/04/2011 PCP: No primary provider on file.  Brief narrative: 71 year old man with a PMH of CAD s/p stent 1992, recent treatment for bronchitis, admitted on 06/05/11 with chest pain and dyspnea.  Found to have bronchospasm on exam, but refused steroids claiming to have had an allergic reaction to them in the past.  Consultants:  None  Procedures:  EKG 06/04/11: SINUS RHYTHM ~ normal P axis, V-rate 50- 99 LOW VOLTAGE IN FRONTAL LEADS ~ all frontal leads <0.66mV BORDERLINE T WAVE ABNORMALITIES ~ T/QRS ratio < 1/20 or flat  BORDERLINE PROLONGED QT INTERVAL ~ QTc >427mS  Antibiotics:  None   Subjective  Mr. Derner continues to report chest discomfort and dyspnea.  The pain is somewhat pleuritic.  No nausea or vomiting.   Objective    Interim History: Stable over night.   Objective: Filed Vitals:   06/05/11 0330 06/05/11 0345 06/05/11 0425 06/05/11 1300  BP: 115/63 114/49 164/83 133/72  Pulse:  96 97 108  Temp:   97.6 F (36.4 C) 98.9 F (37.2 C)  TempSrc:   Oral Oral  Resp: 13 13 16 20   Height:   6' (1.829 m)   Weight:   122.607 kg (270 lb 4.8 oz)   SpO2:  98% 99% 92%    Intake/Output Summary (Last 24 hours) at 06/05/11 1412 Last data filed at 06/05/11 0816  Gross per 24 hour  Intake 401.25 ml  Output    760 ml  Net -358.75 ml    Exam: Gen:  NAD Cardiovascular:  RRR, No M/R/G Respiratory: Lungs with course expiratory wheezes Gastrointestinal: Abdomen soft, NT/ND with normal active bowel sounds. Extremities: No C/E/C   Data Reviewed: Basic Metabolic Panel:  Lab 06/05/11 9562 06/04/11 2116  NA 132* 134*  K 5.4* 4.0  CL 100 99  CO2 23 24  GLUCOSE 313* 220*  BUN 22 22  CREATININE 1.20 1.26  CALCIUM 9.3 9.7  MG -- --  PHOS -- --   CBC:  Lab 06/05/11 0641 06/04/11 2116  WBC 6.3 7.2  NEUTROABS -- 5.9  HGB 10.8* 11.4*  HCT 30.5* 33.0*  MCV 90.5 91.7  PLT 128* 146*   Cardiac  Enzymes:  Lab 06/05/11 0933 06/05/11 0110  CKTOTAL -- --  CKMB -- --  CKMBINDEX -- --  TROPONINI <0.30 <0.30    CBG:  Lab 06/05/11 1253 06/05/11 0814  GLUCAP 290* 258*      Studies:  Dg Chest 2 View 06/04/2011   IMPRESSION: Negative exam.  Original Report Authenticated By: Patterson Hammersmith, M.D.    Scheduled Meds:    . sodium chloride   Intravenous STAT  . albuterol  5 mg Nebulization Once  . albuterol  5 mg Nebulization Once  . aspirin  324 mg Oral Once  . aspirin  325 mg Oral Daily  . enoxaparin  55 mg Subcutaneous Q24H  . insulin aspart  0-20 Units Subcutaneous TID WC  . insulin aspart  0-5 Units Subcutaneous QHS  . insulin aspart  6 Units Subcutaneous TID WC  . insulin glargine  48 Units Subcutaneous QHS  . ipratropium      . losartan  100 mg Oral Daily  . methylPREDNISolone (SOLU-MEDROL) injection  60 mg Intravenous Q8H  . methylPREDNISolone sodium succinate      .  morphine injection  4 mg Intravenous Once  . nitroGLYCERIN  0.5 inch Topical Q6H  . PARoxetine  20 mg Oral  Daily  . simvastatin  40 mg Oral QHS  . temazepam  30 mg Oral Daily  . DISCONTD: albuterol  2.5 mg Nebulization Q4H  . DISCONTD: ezetimibe-simvastatin  1 tablet Oral QHS  . DISCONTD: insulin aspart  0-15 Units Subcutaneous TID WC  . DISCONTD: insulin aspart  0-5 Units Subcutaneous QHS   Continuous Infusions:    Assessment/Plan: Principal Problem:  *Chest pain due to bronchitis The patient was admitted, and because of his cardiac history, cardiac markers were cycled Q 8 hours x 3 sets.  They have been negative.  His 12 lead EKG was non-acute.  He is being monitored on telemetry.  His chest pain is felt to be secondary to bronchospasm and pleurisy.  His records were completely reviewed and there is no evidence of a prior reaction to steroids.  Given this, he is willing to try steroids for his current issues.  Will start him on solumedrol 60 mg IV Q 8 hours and taper as tolerated.  He would  benefit from PFTs in the outpatient setting to determine if he has underlying obstructive lung disease.  Continue nebulized bronchodilator therapy. Active Problems:  DM Appears to be uncontrolled at present.  CBGs 258-290.  Will check hemoglobin A1c and place the patient on insulin resistant SSI with 6 units of Novalog meal coverage.  Continue current dose of Lantus.  Get DM coordinator to see.  HYPERCHOLESTEROLEMIA  IIA Continue zocor.  HYPERTENSION, UNSPECIFIED Continue anti-hypetensives (except Cozaar which has been discontinued due to hyperkalemia).  CAD, UNSPECIFIED SITE No evidence that chest pain is from a cardiac source.  Continue to monitor on telemetry.  Continue ASA.  D/C Nitroglycerin.  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  Stage 3 CKD Renal function stable, at baseline.  Monitor.    Code Status: Full Family Communication: Wife updated at bedside. Disposition Plan: Home when stable.   LOS: 1 day   Hillery Aldo, MD Pager [phone]  06/05/2011, 2:12 PM

## 2011-06-06 ENCOUNTER — Encounter (HOSPITAL_COMMUNITY): Payer: Self-pay | Admitting: Internal Medicine

## 2011-06-06 DIAGNOSIS — F1011 Alcohol abuse, in remission: Secondary | ICD-10-CM

## 2011-06-06 HISTORY — DX: Alcohol abuse, in remission: F10.11

## 2011-06-06 LAB — CBC
HCT: 32.6 % — ABNORMAL LOW (ref 39.0–52.0)
Hemoglobin: 11.3 g/dL — ABNORMAL LOW (ref 13.0–17.0)
MCH: 31.7 pg (ref 26.0–34.0)
RBC: 3.56 MIL/uL — ABNORMAL LOW (ref 4.22–5.81)

## 2011-06-06 LAB — COMPREHENSIVE METABOLIC PANEL
AST: 18 U/L (ref 0–37)
Albumin: 3.4 g/dL — ABNORMAL LOW (ref 3.5–5.2)
CO2: 24 mEq/L (ref 19–32)
Calcium: 9.8 mg/dL (ref 8.4–10.5)
Creatinine, Ser: 1.21 mg/dL (ref 0.50–1.35)
GFR calc non Af Amer: 58 mL/min — ABNORMAL LOW (ref >90)
Sodium: 132 mEq/L — ABNORMAL LOW (ref 135–145)
Total Protein: 6.8 g/dL (ref 6.0–8.3)

## 2011-06-06 LAB — HEMOGLOBIN A1C: Mean Plasma Glucose: 117 mg/dL — ABNORMAL HIGH (ref <117)

## 2011-06-06 LAB — GLUCOSE, CAPILLARY
Glucose-Capillary: 295 mg/dL — ABNORMAL HIGH (ref 70–99)
Glucose-Capillary: 301 mg/dL — ABNORMAL HIGH (ref 70–99)
Glucose-Capillary: 288 mg/dL — ABNORMAL HIGH (ref 70–99)

## 2011-06-06 MED ORDER — ADULT MULTIVITAMIN W/MINERALS CH
1.0000 | ORAL_TABLET | Freq: Every day | ORAL | Status: DC
  Administered 2011-06-06 – 2011-06-08 (×3): 1 via ORAL
  Filled 2011-06-06 (×3): qty 1

## 2011-06-06 MED ORDER — LOPERAMIDE HCL 2 MG PO CAPS: 2.0000 mg | ORAL_CAPSULE | ORAL | Status: DC | PRN

## 2011-06-06 MED ORDER — CHLORDIAZEPOXIDE HCL 25 MG PO CAPS: 50.0000 mg | ORAL_CAPSULE | Freq: Once | ORAL | Status: DC

## 2011-06-06 MED ORDER — HYDROXYZINE HCL 25 MG PO TABS
25.0000 mg | ORAL_TABLET | Freq: Four times a day (QID) | ORAL | Status: DC | PRN
  Filled 2011-06-06: qty 1

## 2011-06-06 MED ORDER — INSULIN ASPART 100 UNIT/ML ~~LOC~~ SOLN
8.0000 [IU] | Freq: Three times a day (TID) | SUBCUTANEOUS | Status: DC
  Administered 2011-06-06 – 2011-06-07 (×3): 8 [IU] via SUBCUTANEOUS
  Filled 2011-06-06: qty 3

## 2011-06-06 MED ORDER — CHLORDIAZEPOXIDE HCL 25 MG PO CAPS: 25.0000 mg | ORAL_CAPSULE | Freq: Three times a day (TID) | ORAL | Status: DC

## 2011-06-06 MED ORDER — VITAMIN B-1 100 MG PO TABS
100.0000 mg | ORAL_TABLET | Freq: Every day | ORAL | Status: DC
  Administered 2011-06-07 – 2011-06-08 (×2): 100 mg via ORAL
  Filled 2011-06-06 (×2): qty 1

## 2011-06-06 MED ORDER — INSULIN GLARGINE 100 UNIT/ML ~~LOC~~ SOLN
60.0000 [IU] | Freq: Every day | SUBCUTANEOUS | Status: DC
  Administered 2011-06-06: 60 [IU] via SUBCUTANEOUS
  Filled 2011-06-06: qty 3

## 2011-06-06 MED ORDER — SODIUM POLYSTYRENE SULFONATE 15 GM/60ML PO SUSP
15.0000 g | Freq: Once | ORAL | Status: AC
  Administered 2011-06-06: 15 g via ORAL
  Filled 2011-06-06: qty 60

## 2011-06-06 MED ORDER — CHLORDIAZEPOXIDE HCL 25 MG PO CAPS: 25.0000 mg | ORAL_CAPSULE | Freq: Four times a day (QID) | ORAL | Status: DC | PRN

## 2011-06-06 MED ORDER — CHLORDIAZEPOXIDE HCL 25 MG PO CAPS: 25.0000 mg | ORAL_CAPSULE | ORAL | Status: DC

## 2011-06-06 MED ORDER — THIAMINE HCL 100 MG/ML IJ SOLN
100.0000 mg | Freq: Once | INTRAMUSCULAR | Status: DC
  Administered 2011-06-06: 100 mg via INTRAMUSCULAR
  Filled 2011-06-06: qty 1

## 2011-06-06 MED ORDER — ONDANSETRON 4 MG PO TBDP
4.0000 mg | ORAL_TABLET | Freq: Four times a day (QID) | ORAL | Status: DC | PRN
  Filled 2011-06-06: qty 1

## 2011-06-06 MED ORDER — CHLORDIAZEPOXIDE HCL 25 MG PO CAPS: 25.0000 mg | ORAL_CAPSULE | Freq: Every day | ORAL | Status: DC

## 2011-06-06 MED ORDER — CHLORDIAZEPOXIDE HCL 25 MG PO CAPS: 25.0000 mg | ORAL_CAPSULE | Freq: Four times a day (QID) | ORAL | Status: DC

## 2011-06-06 NOTE — Progress Notes (Signed)
06-06-11  NSG:  Family member ( a [employer] officer, political [employer], Darl Pikes [phone]) in to visit pt and reports to me that pt "is a heavy ETOH drinker, and has had  history of going into withdrawls in the past - reports Ativan was effective for him.  Pt is hyperverbal tonight but in control.  May we have Ativan withdrawal orders PRN?

## 2011-06-06 NOTE — Progress Notes (Signed)
PROGRESS NOTE  [name] NWG:[id number] DOB: [birth year] DOA: 06/04/2011 PCP: No primary provider on file.  Brief narrative: 71 year old man with a PMH of CAD s/p stent 1992, recent treatment for bronchitis, admitted on 06/05/11 with chest pain and dyspnea.  Found to have bronchospasm on exam, but refused steroids claiming to have had an allergic reaction to them in the past.  Now on steroids with symptom improvement.  Consultants:  None  Procedures:  EKG 06/04/11: SINUS RHYTHM ~ normal P axis, V-rate 50- 99 LOW VOLTAGE IN FRONTAL LEADS ~ all frontal leads <0.30mV BORDERLINE T WAVE ABNORMALITIES ~ T/QRS ratio < 1/20 or flat  BORDERLINE PROLONGED QT INTERVAL ~ QTc >442mS  Antibiotics:  None   Subjective  [name] denies that he drinks daily, says he "used to" but that he only has an occasional beer now.  His chest pain is improving.  His dyspnea is improving.   Objective    Interim History: Stable over night.  Family member confided in RN that patient is a heavy alcohol user, and has had ETOH withdrawal in the past.   Objective: Filed Vitals:   06/05/11 1300 06/05/11 2203 06/05/11 2300 06/06/11 0435  BP: 133/72 145/73  143/77  Pulse: 108 89  85  Temp: 98.9 F (37.2 C) 97.7 F (36.5 C)  97.6 F (36.4 C)  TempSrc: Oral Oral  Oral  Resp: 20 20  20   Height:      Weight:      SpO2: 92% 91% 93% 92%    Intake/Output Summary (Last 24 hours) at 06/06/11 0946 Last data filed at 06/06/11 0825  Gross per 24 hour  Intake    720 ml  Output   1100 ml  Net   -380 ml    Exam: Gen:  NAD Cardiovascular:  RRR, No M/R/G Respiratory: Lungs with course expiratory wheezes Gastrointestinal: Abdomen soft, NT/ND with normal active bowel sounds. Extremities: No C/E/C   Data Reviewed: Basic Metabolic Panel:  Lab 06/06/11 5784 06/05/11 0641 06/04/11 2116  NA 132* 132* 134*  K 5.5* 5.4* --  CL 100 100 99  CO2 24 23 24   GLUCOSE 328* 313* 220*  BUN 36* 22 22  CREATININE 1.21 1.20  1.26  CALCIUM 9.8 9.3 9.7  MG -- -- --  PHOS -- -- --   CBC:  Lab 06/06/11 0520 06/05/11 0641 06/04/11 2116  WBC 11.8* 6.3 7.2  NEUTROABS -- -- 5.9  HGB 11.3* 10.8* 11.4*  HCT 32.6* 30.5* 33.0*  MCV 91.6 90.5 91.7  PLT 149* 128* 146*   Cardiac Enzymes:  Lab 06/05/11 1650 06/05/11 0933 06/05/11 0110  CKTOTAL -- -- --  CKMB -- -- --  CKMBINDEX -- -- --  TROPONINI <0.30 <0.30 <0.30    CBG:  Lab 06/06/11 0745 06/05/11 2142 06/05/11 1645 06/05/11 1253 06/05/11 0814  GLUCAP 295* 294* 255* 290* 258*      Studies:  Dg Chest 2 View 06/04/2011   IMPRESSION: Negative exam.  Original Report Authenticated By: Patterson Hammersmith, M.D.    Scheduled Meds:    . sodium chloride   Intravenous STAT  . aspirin  325 mg Oral Daily  . enoxaparin  55 mg Subcutaneous Q24H  . insulin aspart  0-20 Units Subcutaneous TID WC  . insulin aspart  0-5 Units Subcutaneous QHS  . insulin aspart  6 Units Subcutaneous TID WC  . insulin glargine  48 Units Subcutaneous QHS  . methylPREDNISolone (SOLU-MEDROL) injection  60 mg Intravenous  Q8H  .  morphine injection  4 mg Intravenous Once  . morphine      . nitroGLYCERIN  0.5 inch Topical Q6H  . PARoxetine  20 mg Oral Daily  . simvastatin  40 mg Oral QHS  . temazepam  30 mg Oral Daily  . DISCONTD: insulin aspart  0-15 Units Subcutaneous TID WC  . DISCONTD: insulin aspart  0-5 Units Subcutaneous QHS  . DISCONTD: losartan  100 mg Oral Daily  . DISCONTD: nitroGLYCERIN  0.5 inch Topical Q6H   Continuous Infusions:    Assessment/Plan: Principal Problem:  *Chest pain due to bronchitis The patient was admitted, and because of his cardiac history, cardiac markers were cycled Q 8 hours x 3 sets.  They have been negative.  His 12 lead EKG was non-acute.  He is being monitored on telemetry.  His chest pain is felt to be secondary to bronchospasm and pleurisy.  His records were completely reviewed and there is no evidence of a prior reaction to steroids.   Given this, he is willing to try steroids for his current issues.  He was started on solumedrol 60 mg IV Q 8 hours on 06/05/11.  He would benefit from PFTs in the outpatient setting to determine if he has underlying obstructive lung disease.  Continue nebulized bronchodilator therapy. Active Problems:  DM Appears to be uncontrolled at present.  CBGs 255-295.  Insulin resistant SSI with 6 units of Novalog meal coverage started on 06/05/11, and meal coverage increased to 8 units on 06/06/11.  Increase current dose of Lantus to 60 units.  DM coordinator evaluation pending.  Re-order hemoglobin A1c.  HYPERCHOLESTEROLEMIA  IIA Continue zocor.  HYPERTENSION, UNSPECIFIED Continue anti-hypetensives (except Cozaar which has been discontinued due to hyperkalemia).  CAD, UNSPECIFIED SITE No evidence that chest pain is from a cardiac source.  Continue to monitor on telemetry.  Continue ASA.  D/C Nitroglycerin.  Hyperkalemia Cozaar held.  K+ still high.  Will give a dose of kayexalate.  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  Stage 3 CKD Renal function stable, at baseline.  Monitor.  H/O ETOH abuse Patient adamantly denies current abuse.  Will order Librium PRN, and evaluation with CIWA per nursing staff, but avoid standing orders for Librium administration.    Code Status: Full Family Communication: No family present.  Wife updated at bedside 06/05/11. Disposition Plan: Home when stable.   LOS: 2 days   Hillery Aldo, MD Pager [phone]  06/06/2011, 9:46 AM

## 2011-06-07 LAB — GLUCOSE, CAPILLARY: Glucose-Capillary: 277 mg/dL — ABNORMAL HIGH (ref 70–99)

## 2011-06-07 LAB — BASIC METABOLIC PANEL
Chloride: 97 mEq/L (ref 96–112)
Creatinine, Ser: 1.11 mg/dL (ref 0.50–1.35)
GFR calc Af Amer: 75 mL/min — ABNORMAL LOW (ref >90)
Potassium: 4.5 mEq/L (ref 3.5–5.1)
Sodium: 133 mEq/L — ABNORMAL LOW (ref 135–145)

## 2011-06-07 LAB — CBC
HCT: 32.5 % — ABNORMAL LOW (ref 39.0–52.0)
RDW: 13.5 % (ref 11.5–15.5)
WBC: 11.9 10*3/uL — ABNORMAL HIGH (ref 4.0–10.5)

## 2011-06-07 MED ORDER — INSULIN ASPART 100 UNIT/ML ~~LOC~~ SOLN
10.0000 [IU] | Freq: Three times a day (TID) | SUBCUTANEOUS | Status: DC
  Administered 2011-06-07 – 2011-06-08 (×4): 10 [IU] via SUBCUTANEOUS

## 2011-06-07 MED ORDER — METHYLPREDNISOLONE SODIUM SUCC 125 MG IJ SOLR
60.0000 mg | Freq: Two times a day (BID) | INTRAMUSCULAR | Status: AC
  Administered 2011-06-07: 60 mg via INTRAVENOUS
  Filled 2011-06-07: qty 0.96

## 2011-06-07 MED ORDER — ACETAMINOPHEN 325 MG PO TABS
650.0000 mg | ORAL_TABLET | Freq: Once | ORAL | Status: AC
  Administered 2011-06-07: 650 mg via ORAL
  Filled 2011-06-07: qty 2

## 2011-06-07 MED ORDER — METHYLPREDNISOLONE SODIUM SUCC 125 MG IJ SOLR: 60.0000 mg | Freq: Two times a day (BID) | INTRAMUSCULAR | Status: DC

## 2011-06-07 MED ORDER — PREDNISONE 50 MG PO TABS
60.0000 mg | ORAL_TABLET | Freq: Every day | ORAL | Status: DC
  Administered 2011-06-08: 60 mg via ORAL
  Filled 2011-06-07: qty 1

## 2011-06-07 MED ORDER — INSULIN GLARGINE 100 UNIT/ML ~~LOC~~ SOLN
70.0000 [IU] | Freq: Every day | SUBCUTANEOUS | Status: DC
  Administered 2011-06-07: 70 [IU] via SUBCUTANEOUS

## 2011-06-07 NOTE — Progress Notes (Addendum)
PROGRESS NOTE  [name]:[id number] DOB: 06/29/1940 DOA: 06/04/2011 PCP: No primary provider on file.  Brief narrative: 71 year old man with a PMH of CAD s/p stent 1992, recent treatment for bronchitis, admitted on 06/05/11 with chest pain and dyspnea.  Found to have bronchospasm on exam, but refused steroids claiming to have had an allergic reaction to them in the past.  Now on steroids with symptom improvement.  Consultants:  None  Procedures:  EKG 06/04/11: SINUS RHYTHM ~ normal P axis, V-rate 50- 99 LOW VOLTAGE IN FRONTAL LEADS ~ all frontal leads <0.67mV BORDERLINE T WAVE ABNORMALITIES ~ T/QRS ratio < 1/20 or flat  BORDERLINE PROLONGED QT INTERVAL ~ QTc >471mS  Antibiotics:  None   Subjective  [name] feels better.  Less chest discomfort.  Less dyspnea.   Objective    Interim History: Stable over night.    Objective: Filed Vitals:   06/06/11 2111 06/06/11 2119 06/07/11 0100 06/07/11 0619  BP: 132/67  130/66 169/74  Pulse: 81  83 93  Temp: 98.1 F (36.7 C)   97.4 F (36.3 C)  TempSrc: Oral   Oral  Resp: 19   19  Height:      Weight:      SpO2: 92% 94%  94%    Intake/Output Summary (Last 24 hours) at 06/07/11 1011 Last data filed at 06/07/11 9562  Gross per 24 hour  Intake   1680 ml  Output   2555 ml  Net   -875 ml    Exam: Gen:  NAD Cardiovascular:  RRR, No M/R/G Respiratory: Lungs clearer with an occasional wheeze Gastrointestinal: Abdomen soft, NT/ND with normal active bowel sounds. Extremities: No C/E/C   Data Reviewed: Basic Metabolic Panel:  Lab 06/07/11 1308 06/06/11 0520 06/05/11 0641 06/04/11 2116  NA 133* 132* 132* 134*  K 4.5 5.5* -- --  CL 97 100 100 99  CO2 26 24 23 24   GLUCOSE 278* 328* 313* 220*  BUN 37* 36* 22 22  CREATININE 1.11 1.21 1.20 1.26  CALCIUM 9.8 9.8 9.3 9.7  MG -- -- -- --  PHOS -- -- -- --   CBC:  Lab 06/07/11 0420 06/06/11 0520 06/05/11 0641 06/04/11 2116  WBC 11.9* 11.8* 6.3 7.2  NEUTROABS -- -- --  5.9  HGB 11.3* 11.3* 10.8* 11.4*  HCT 32.5* 32.6* 30.5* 33.0*  MCV 91.0 91.6 90.5 91.7  PLT 170 149* 128* 146*   Cardiac Enzymes:  Lab 06/05/11 1650 06/05/11 0933 06/05/11 0110  CKTOTAL -- -- --  CKMB -- -- --  CKMBINDEX -- -- --  TROPONINI <0.30 <0.30 <0.30    CBG:  Lab 06/07/11 0728 06/06/11 2106 06/06/11 1701 06/06/11 1152 06/06/11 0745  GLUCAP 277* 288* 318* 301* 295*     Ref. Range 06/06/2011 05:20  Hemoglobin A1C Latest Range: <5.7 % 5.7 (H)     Studies:  Dg Chest 2 View 06/04/2011   IMPRESSION: Negative exam.  Original Report Authenticated By: Patterson Hammersmith, M.D.    Scheduled Meds:    . aspirin  325 mg Oral Daily  . enoxaparin  55 mg Subcutaneous Q24H  . insulin aspart  0-20 Units Subcutaneous TID WC  . insulin aspart  0-5 Units Subcutaneous QHS  . insulin aspart  8 Units Subcutaneous TID WC  . insulin glargine  60 Units Subcutaneous QHS  . methylPREDNISolone (SOLU-MEDROL) injection  60 mg Intravenous Q8H  . mulitivitamin with minerals  1 tablet Oral Daily  . nitroGLYCERIN  0.5 inch Topical Q6H  . PARoxetine  20 mg Oral Daily  . simvastatin  40 mg Oral QHS  . sodium polystyrene  15 g Oral Once  . temazepam  30 mg Oral Daily  . thiamine  100 mg Oral Daily   Continuous Infusions:    Assessment/Plan: Principal Problem:  *Chest pain due to bronchitis The patient was admitted, and because of his cardiac history, cardiac markers were cycled Q 8 hours x 3 sets and were negative.  His 12 lead EKG was non-acute.  He is being monitored on telemetry, with no significant abnormalities.  His chest pain is felt to be secondary to bronchospasm and pleurisy.  His records were completely reviewed and there is no evidence of a prior reaction to steroids.  Given this, he is willing to try steroids for his current issues.  He was started on solumedrol 60 mg IV Q 8 hours on 06/05/11.  Started to taper steroids 06/07/11.  He would benefit from PFTs in the outpatient setting to  determine if he has underlying obstructive lung disease.  Continue nebulized bronchodilator therapy. Active Problems:  DM Appears to be uncontrolled at present.  CBGs 277-318.  Insulin resistant SSI with 6 units of Novalog meal coverage started on 06/05/11, and meal coverage increased to 8 units on 06/06/11 and to 10 units on 06/07/11.  We increased Lantus to 60 units on 06/06/11 and to 70 units on 06/07/11..  DM coordinator evaluation pending.  Hemoglobin A1c is 5.7%, indicating that current lack of glycemic control is relatively new, and may be due to acute illness and steroid use.  HYPERCHOLESTEROLEMIA  IIA Continue zocor.  HYPERTENSION, UNSPECIFIED Continue anti-hypetensives (except Cozaar which has been discontinued due to hyperkalemia).  CAD, UNSPECIFIED SITE No evidence that chest pain is from a cardiac source.  Continue to monitor on telemetry.  Continue ASA and nitropaste.  Hyperkalemia Cozaar held.  K+ now normal status post kayexalate 06/06/11.  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  Stage 3 CKD Renal function stable, at baseline.  Monitor.  H/O ETOH abuse Patient adamantly denies current abuse.  Will order Librium PRN, and evaluation with CIWA per nursing staff, but avoid standing orders for Librium administration.    Code Status: Full Family Communication: No family present.  Wife updated at bedside 06/05/11. Disposition Plan: Home when stable.   LOS: 3 days   Hillery Aldo, MD Pager [phone]  06/07/2011, 10:11 AM

## 2011-06-08 LAB — GLUCOSE, CAPILLARY: Glucose-Capillary: 271 mg/dL — ABNORMAL HIGH (ref 70–99)

## 2011-06-08 LAB — BASIC METABOLIC PANEL
BUN: 38 mg/dL — ABNORMAL HIGH (ref 6–23)
Calcium: 9.7 mg/dL (ref 8.4–10.5)
GFR calc non Af Amer: 60 mL/min — ABNORMAL LOW (ref >90)
Glucose, Bld: 208 mg/dL — ABNORMAL HIGH (ref 70–99)
Sodium: 136 mEq/L (ref 135–145)

## 2011-06-08 LAB — CBC
MCH: 31.8 pg (ref 26.0–34.0)
MCHC: 35.3 g/dL (ref 30.0–36.0)
Platelets: 180 10*3/uL (ref 150–400)
RBC: 3.77 MIL/uL — ABNORMAL LOW (ref 4.22–5.81)
RDW: 13.5 % (ref 11.5–15.5)

## 2011-06-08 MED ORDER — INSULIN GLARGINE 100 UNIT/ML ~~LOC~~ SOLN: 70.0000 [IU] | Freq: Every day | SUBCUTANEOUS | Status: DC

## 2011-06-08 MED ORDER — BENZONATATE 100 MG PO CAPS: 100.0000 mg | ORAL_CAPSULE | Freq: Three times a day (TID) | ORAL | Status: AC | PRN

## 2011-06-08 MED ORDER — PREDNISONE (PAK) 10 MG PO TABS: ORAL_TABLET | ORAL | Status: AC

## 2011-06-08 NOTE — Discharge Summary (Signed)
Physician Discharge Summary  Patient ID: [name] MRN: [id number] DOB/AGE: April 18, 1940 71 y.o.  Admit date: 06/04/2011 Discharge date: 06/08/2011  Primary Care Physician:  Ailene Ravel, MD, MD   Discharge Diagnoses:    Present on Admission:  .Chest pain .CAD, UNSPECIFIED SITE .DM .HYPERCHOLESTEROLEMIA  IIA .HYPERTENSION, UNSPECIFIED .Bronchitis .Hyperkalemia .Normocytic anemia .Uncontrolled secondary diabetes mellitus with stage 3 CKD (GFR 30-59) .H/O ETOH abuse  Discharge Medications:  Medication List  As of 06/08/2011  4:21 PM   STOP taking these medications         fenofibrate micronized 134 MG capsule      gabapentin 300 MG capsule      simvastatin 40 MG tablet      temazepam 30 MG capsule         TAKE these medications         aspirin 81 MG tablet   Take 81 mg by mouth daily.      benzonatate 100 MG capsule   Commonly known as: TESSALON   Take 1 capsule (100 mg total) by mouth 3 (three) times daily as needed for cough.      Cinnamon 500 MG capsule   Take 500 mg by mouth daily.      ezetimibe-simvastatin 10-40 MG per tablet   Commonly known as: VYTORIN   Take 1 tablet by mouth at bedtime.      Fish Oil Oil   Take 1 tablet by mouth daily.      insulin aspart 100 UNIT/ML injection   Commonly known as: novoLOG   Inject 14-16 Units into the skin See admin instructions. Breakfast 16 units; Lunch 20 units;  Dinner 14 units      insulin glargine 100 UNIT/ML injection   Commonly known as: LANTUS   Inject 70 Units into the skin at bedtime.      losartan 100 MG tablet   Commonly known as: COZAAR   Take 100 mg by mouth daily.      multivitamin tablet   Take 1 tablet by mouth daily.      naproxen sodium 220 MG tablet   Commonly known as: ANAPROX   Take 220 mg by mouth 2 (two) times daily with a meal.      PARoxetine 20 MG tablet   Commonly known as: PAXIL   Take 20 mg by mouth daily.      predniSONE 10 MG tablet   Commonly known as:  STERAPRED UNI-PAK   Take 6 tabs 06/09/11, decrease by 10 mg daily until off.      VICTOZA Blunt   Inject 1.2 mg into the skin daily.             Disposition and Follow-up: The patient is being discharged home.  He is instructed to follow up with his PCP in one week.   Medical Consults:  None  Significant Diagnostic Studies:  Dg Chest 2 View  06/04/2011  *RADIOLOGY REPORT*  Clinical Data: Chest pain, shortness of breath, diabetes.  History of coronary artery disease.  CHEST - 2 VIEW  Comparison: 01/23/2009  Findings: Heart size is accentuated by the AP position of the patient.  Lungs are clear.  No edema. Visualized osseous structures have a normal appearance.  IMPRESSION: Negative exam.  Original Report Authenticated By: Patterson Hammersmith, M.D.    Discharge Laboratory Values: Basic Metabolic Panel:  Lab 06/08/11 6213 06/07/11 0420 06/06/11 0520 06/05/11 0641 06/04/11 2116  NA 136 133* 132* 132* 134*  K 4.5 4.5 -- -- --  CL 99 97 100 100 99  CO2 27 26 24 23 24   GLUCOSE 208* 278* 328* 313* 220*  BUN 38* 37* 36* 22 22  CREATININE 1.17 1.11 1.21 1.20 1.26  CALCIUM 9.7 9.8 9.8 9.3 9.7  MG -- -- -- -- --  PHOS -- -- -- -- --   GFR Estimated Creatinine Clearance: 77.2 ml/min (by C-G formula based on Cr of 1.17). Liver Function Tests:  Lab 06/06/11 0520  AST 18  ALT 20  ALKPHOS 46  BILITOT 0.4  PROT 6.8  ALBUMIN 3.4*    CBC:  Lab 06/08/11 0425 06/07/11 0420 06/06/11 0520 06/05/11 0641 06/04/11 2116  WBC 12.8* 11.9* 11.8* 6.3 7.2  NEUTROABS -- -- -- -- 5.9  HGB 12.0* 11.3* 11.3* 10.8* 11.4*  HCT 34.0* 32.5* 32.6* 30.5* 33.0*  MCV 90.2 91.0 91.6 90.5 91.7  PLT 180 170 149* 128* 146*   Cardiac Enzymes:  Lab 06/05/11 1650 06/05/11 0933 06/05/11 0110  CKTOTAL -- -- --  CKMB -- -- --  CKMBINDEX -- -- --  TROPONINI <0.30 <0.30 <0.30   CBG:  Lab 06/08/11 1131 06/08/11 0737 06/07/11 2231 06/07/11 1648 06/07/11 1122  GLUCAP 252* 197* 271* 270* 355*   Hgb  A1c  Basename 06/06/11 0520  HGBA1C 5.7*    Brief H and P: For complete details please refer to admission H and P, but in brief, [name] is a 71 year old man with a PMH of CAD s/p stent 1992, recent treatment for bronchitis, admitted on 06/05/11 with chest pain and dyspnea. Found to have bronchospasm on exam, but refused steroids claiming to have had an allergic reaction to them in the past.   Physical Exam at Discharge: BP 177/94  Pulse 163  Temp(Src) 97.6 F (36.4 C) (Oral)  Resp 20  Ht 6' (1.829 m)  Wt 122.607 kg (270 lb 4.8 oz)  BMI 36.66 kg/m2  SpO2 94% Gen:  NAD Cardiovascular:  RRR, No M/R/G Respiratory: Lungs course with expiratory wheezes Gastrointestinal: Abdomen soft, NT/ND with normal active bowel sounds. Extremities: No C/E/C   Hospital Course:  *Chest pain due to bronchitis  The patient was admitted, and because of his cardiac history, cardiac markers were cycled Q 8 hours x 3 sets and were negative. His 12 lead EKG was non-acute. He was monitored on telemetry, with no significant abnormalities. His chest pain was felt to be secondary to bronchospasm and pleurisy. His records were completely reviewed and there is no evidence of a prior reaction to steroids. Given this, he was willing to try steroids for his current issues. He was started on solumedrol 60 mg IV Q 8 hours on 06/05/11. Started to taper steroids 06/07/11, and will d/c on a steroid dose pack. He would benefit from PFTs in the outpatient setting to determine if he has underlying obstructive lung disease.  Active Problems:  DM  Appears to be uncontrolled at present. CBGs 277-318. Insulin resistant SSI with 6 units of Novalog meal coverage started on 06/05/11, and meal coverage increased to 8 units on 06/06/11 and to 10 units on 06/07/11. We increased Lantus to 60 units on 06/06/11 and to 70 units on 06/07/11. Hemoglobin A1c was 5.7%, indicating that current lack of glycemic control is relatively new, and may be due to acute  illness and steroid use.  HYPERCHOLESTEROLEMIA IIA  Continued on zocor.  HYPERTENSION, UNSPECIFIED  Continued on anti-hypetensives (except Cozaar which has been discontinued due to hyperkalemia). We resumed Cozaar at d/c but will  need BMET done at PCP's office in 1 week to ensure that he has not developed recurrent hyperkalemia. CAD, UNSPECIFIED SITE  There was no evidence that chest pain was from a cardiac source. He was monitored on telemetry and continued on ASA and nitropaste. D/C nitropaste at d/c. Hyperkalemia  Cozaar held. K+ now normal status post kayexalate 06/06/11.  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  Stage 3 CKD  Renal function stable, at baseline. Monitor.  H/O ETOH abuse  Patient adamantly denies current abuse. Will order Librium PRN, and evaluation with CIWA per nursing staff, but avoid standing orders for Librium administration. No evidence of DTs or withdrawal during hospital stay.  Recommendations for hospital follow-up: 1. Repeat BMET to evaluate K+ (if elevated, d/c Cozaar) 2.  Consider outpatient PFTs after acute illness resolved  Diet:  Carbohydrate modified  Activity:  Increase activity slowly  Condition at Discharge:   Improved  Time spent on Discharge:   Signed: Dr. Trula Ore Abbigal Radich Pager [phone] 06/08/2011, 4:21 PM

## 2011-06-08 NOTE — Discharge Instructions (Signed)

## 2011-06-12 ENCOUNTER — Inpatient Hospital Stay (HOSPITAL_COMMUNITY): Payer: Medicare Other

## 2011-06-12 ENCOUNTER — Emergency Department (HOSPITAL_COMMUNITY): Payer: Medicare Other

## 2011-06-12 ENCOUNTER — Inpatient Hospital Stay (HOSPITAL_COMMUNITY): Payer: Medicare Other | Admitting: Anesthesiology

## 2011-06-12 ENCOUNTER — Other Ambulatory Visit: Payer: Self-pay

## 2011-06-12 ENCOUNTER — Inpatient Hospital Stay (HOSPITAL_COMMUNITY)
Admission: EM | Admit: 2011-06-12 | Discharge: 2011-06-24 | DRG: 485 | Disposition: A | Discharge status: Skilled Nursing Facility | Payer: Medicare Other | Attending: Family Medicine | Admitting: Family Medicine

## 2011-06-12 ENCOUNTER — Encounter (HOSPITAL_COMMUNITY): Payer: Self-pay | Admitting: Anesthesiology

## 2011-06-12 ENCOUNTER — Encounter (HOSPITAL_COMMUNITY): Payer: Self-pay | Admitting: *Deleted

## 2011-06-12 ENCOUNTER — Encounter (HOSPITAL_COMMUNITY)
Admission: EM | Disposition: A | Discharge status: Skilled Nursing Facility | Payer: Self-pay | Source: Home / Self Care | Attending: Internal Medicine

## 2011-06-12 DIAGNOSIS — T8454XA Infection and inflammatory reaction due to internal left knee prosthesis, initial encounter: Secondary | ICD-10-CM

## 2011-06-12 DIAGNOSIS — E871 Hypo-osmolality and hyponatremia: Secondary | ICD-10-CM

## 2011-06-12 DIAGNOSIS — B9561 Methicillin susceptible Staphylococcus aureus infection as the cause of diseases classified elsewhere: Secondary | ICD-10-CM

## 2011-06-12 DIAGNOSIS — N400 Enlarged prostate without lower urinary tract symptoms: Secondary | ICD-10-CM

## 2011-06-12 DIAGNOSIS — I10 Essential (primary) hypertension: Secondary | ICD-10-CM

## 2011-06-12 DIAGNOSIS — M25579 Pain in unspecified ankle and joints of unspecified foot: Secondary | ICD-10-CM | POA: Diagnosis present

## 2011-06-12 DIAGNOSIS — K5190 Ulcerative colitis, unspecified, without complications: Secondary | ICD-10-CM

## 2011-06-12 DIAGNOSIS — M25469 Effusion, unspecified knee: Secondary | ICD-10-CM | POA: Diagnosis present

## 2011-06-12 DIAGNOSIS — M009 Pyogenic arthritis, unspecified: Secondary | ICD-10-CM

## 2011-06-12 DIAGNOSIS — M109 Gout, unspecified: Secondary | ICD-10-CM

## 2011-06-12 DIAGNOSIS — E7800 Pure hypercholesterolemia, unspecified: Secondary | ICD-10-CM

## 2011-06-12 DIAGNOSIS — I498 Other specified cardiac arrhythmias: Secondary | ICD-10-CM | POA: Diagnosis not present

## 2011-06-12 DIAGNOSIS — M25569 Pain in unspecified knee: Secondary | ICD-10-CM

## 2011-06-12 DIAGNOSIS — E875 Hyperkalemia: Secondary | ICD-10-CM

## 2011-06-12 DIAGNOSIS — J40 Bronchitis, not specified as acute or chronic: Secondary | ICD-10-CM

## 2011-06-12 DIAGNOSIS — D62 Acute posthemorrhagic anemia: Secondary | ICD-10-CM | POA: Diagnosis not present

## 2011-06-12 DIAGNOSIS — T8450XA Infection and inflammatory reaction due to unspecified internal joint prosthesis, initial encounter: Secondary | ICD-10-CM | POA: Diagnosis present

## 2011-06-12 DIAGNOSIS — Z9049 Acquired absence of other specified parts of digestive tract: Secondary | ICD-10-CM

## 2011-06-12 DIAGNOSIS — Z85048 Personal history of other malignant neoplasm of rectum, rectosigmoid junction, and anus: Secondary | ICD-10-CM

## 2011-06-12 DIAGNOSIS — D649 Anemia, unspecified: Secondary | ICD-10-CM

## 2011-06-12 DIAGNOSIS — D631 Anemia in chronic kidney disease: Secondary | ICD-10-CM | POA: Diagnosis present

## 2011-06-12 DIAGNOSIS — Z794 Long term (current) use of insulin: Secondary | ICD-10-CM

## 2011-06-12 DIAGNOSIS — R079 Chest pain, unspecified: Secondary | ICD-10-CM | POA: Diagnosis present

## 2011-06-12 DIAGNOSIS — R32 Unspecified urinary incontinence: Secondary | ICD-10-CM

## 2011-06-12 DIAGNOSIS — Z6837 Body mass index (BMI) 37.0-37.9, adult: Secondary | ICD-10-CM

## 2011-06-12 DIAGNOSIS — N289 Disorder of kidney and ureter, unspecified: Secondary | ICD-10-CM

## 2011-06-12 DIAGNOSIS — M659 Unspecified synovitis and tenosynovitis, unspecified site: Secondary | ICD-10-CM | POA: Diagnosis present

## 2011-06-12 DIAGNOSIS — F329 Major depressive disorder, single episode, unspecified: Secondary | ICD-10-CM

## 2011-06-12 DIAGNOSIS — I129 Hypertensive chronic kidney disease with stage 1 through stage 4 chronic kidney disease, or unspecified chronic kidney disease: Secondary | ICD-10-CM | POA: Diagnosis present

## 2011-06-12 DIAGNOSIS — IMO0002 Reserved for concepts with insufficient information to code with codable children: Secondary | ICD-10-CM

## 2011-06-12 DIAGNOSIS — A4901 Methicillin susceptible Staphylococcus aureus infection, unspecified site: Secondary | ICD-10-CM | POA: Diagnosis present

## 2011-06-12 DIAGNOSIS — J9600 Acute respiratory failure, unspecified whether with hypoxia or hypercapnia: Secondary | ICD-10-CM

## 2011-06-12 DIAGNOSIS — F3289 Other specified depressive episodes: Secondary | ICD-10-CM

## 2011-06-12 DIAGNOSIS — I2510 Atherosclerotic heart disease of native coronary artery without angina pectoris: Secondary | ICD-10-CM

## 2011-06-12 DIAGNOSIS — R55 Syncope and collapse: Secondary | ICD-10-CM

## 2011-06-12 DIAGNOSIS — E785 Hyperlipidemia, unspecified: Secondary | ICD-10-CM | POA: Diagnosis present

## 2011-06-12 DIAGNOSIS — R7881 Bacteremia: Secondary | ICD-10-CM

## 2011-06-12 DIAGNOSIS — J95821 Acute postprocedural respiratory failure: Secondary | ICD-10-CM | POA: Diagnosis not present

## 2011-06-12 DIAGNOSIS — D72829 Elevated white blood cell count, unspecified: Secondary | ICD-10-CM

## 2011-06-12 DIAGNOSIS — Z96659 Presence of unspecified artificial knee joint: Secondary | ICD-10-CM

## 2011-06-12 DIAGNOSIS — F1011 Alcohol abuse, in remission: Secondary | ICD-10-CM

## 2011-06-12 DIAGNOSIS — E118 Type 2 diabetes mellitus with unspecified complications: Secondary | ICD-10-CM

## 2011-06-12 DIAGNOSIS — Y831 Surgical operation with implant of artificial internal device as the cause of abnormal reaction of the patient, or of later complication, without mention of misadventure at the time of the procedure: Secondary | ICD-10-CM | POA: Diagnosis present

## 2011-06-12 DIAGNOSIS — I4891 Unspecified atrial fibrillation: Secondary | ICD-10-CM | POA: Diagnosis not present

## 2011-06-12 DIAGNOSIS — I4892 Unspecified atrial flutter: Secondary | ICD-10-CM

## 2011-06-12 DIAGNOSIS — N183 Chronic kidney disease, stage 3 unspecified: Secondary | ICD-10-CM

## 2011-06-12 DIAGNOSIS — E1322 Other specified diabetes mellitus with diabetic chronic kidney disease: Secondary | ICD-10-CM

## 2011-06-12 DIAGNOSIS — Z933 Colostomy status: Secondary | ICD-10-CM

## 2011-06-12 DIAGNOSIS — G9349 Other encephalopathy: Secondary | ICD-10-CM | POA: Diagnosis not present

## 2011-06-12 DIAGNOSIS — N179 Acute kidney failure, unspecified: Secondary | ICD-10-CM | POA: Diagnosis present

## 2011-06-12 HISTORY — PX: I&D KNEE WITH POLY EXCHANGE: SHX5024

## 2011-06-12 HISTORY — DX: Abdominal aortic aneurysm, without rupture: I71.4

## 2011-06-12 HISTORY — DX: Malignant neoplasm of bladder, unspecified: C67.9

## 2011-06-12 LAB — CBC
MCH: 31.5 pg (ref 26.0–34.0)
MCV: 90.4 fL (ref 78.0–100.0)
Platelets: 157 10*3/uL (ref 150–400)
RBC: 3.84 MIL/uL — ABNORMAL LOW (ref 4.22–5.81)
RDW: 13.2 % (ref 11.5–15.5)

## 2011-06-12 LAB — GLUCOSE, CAPILLARY: Glucose-Capillary: 200 mg/dL — ABNORMAL HIGH (ref 70–99)

## 2011-06-12 LAB — BASIC METABOLIC PANEL
Calcium: 9.2 mg/dL (ref 8.4–10.5)
GFR calc Af Amer: 37 mL/min — ABNORMAL LOW (ref >90)
GFR calc non Af Amer: 32 mL/min — ABNORMAL LOW (ref >90)
Potassium: 4.4 mEq/L (ref 3.5–5.1)
Sodium: 130 mEq/L — ABNORMAL LOW (ref 135–145)

## 2011-06-12 LAB — SYNOVIAL CELL COUNT + DIFF, W/ CRYSTALS
Crystals, Fluid: NONE SEEN
Eosinophils-Synovial: 0 % (ref 0–1)
Lymphocytes-Synovial Fld: 1 % (ref 0–20)
Lymphocytes-Synovial Fld: 5 % (ref 0–20)
Monocyte-Macrophage-Synovial Fluid: 9 % — ABNORMAL LOW (ref 50–90)
Monocyte-Macrophage-Synovial Fluid: 5 % — ABNORMAL LOW (ref 50–90)
Neutrophil, Synovial: 90 % — ABNORMAL HIGH (ref 0–25)
WBC, Synovial: UNDETERMINED /mm3 (ref 0–200)

## 2011-06-12 LAB — DIFFERENTIAL
Basophils Absolute: 0 10*3/uL (ref 0.0–0.1)
Basophils Relative: 0 % (ref 0–1)
Eosinophils Absolute: 0 10*3/uL (ref 0.0–0.7)
Eosinophils Relative: 0 % (ref 0–5)
Neutrophils Relative %: 88 % — ABNORMAL HIGH (ref 43–77)

## 2011-06-12 LAB — TYPE AND SCREEN: Antibody Screen: NEGATIVE

## 2011-06-12 LAB — BLOOD GAS, ARTERIAL
Acid-base deficit: 2.8 mmol/L — ABNORMAL HIGH (ref 0.0–2.0)
FIO2: 1 %
TCO2: 20.6 mmol/L (ref 0–100)
pCO2 arterial: 43.2 mmHg (ref 35.0–45.0)
pH, Arterial: 7.336 — ABNORMAL LOW (ref 7.350–7.450)
pO2, Arterial: 317 mmHg — ABNORMAL HIGH (ref 80.0–100.0)

## 2011-06-12 LAB — SEDIMENTATION RATE: Sed Rate: 88 mm/hr — ABNORMAL HIGH (ref 0–16)

## 2011-06-12 LAB — ABO/RH: ABO/RH(D): A NEG

## 2011-06-12 SURGERY — IRRIGATION AND DEBRIDEMENT KNEE WITH POLY EXCHANGE
Anesthesia: Choice | Site: Knee | Laterality: Left | Wound class: Dirty or Infected

## 2011-06-12 MED ORDER — DEXTROSE 10 % IV SOLN: INTRAVENOUS | Status: DC

## 2011-06-12 MED ORDER — PAROXETINE HCL 20 MG PO TABS
20.0000 mg | ORAL_TABLET | Freq: Every day | ORAL | Status: DC
  Administered 2011-06-13 – 2011-06-18 (×6): 20 mg via ORAL
  Filled 2011-06-12 (×8): qty 1

## 2011-06-12 MED ORDER — TOBRAMYCIN SULFATE 1.2 G IJ SOLR
INTRAMUSCULAR | Status: AC
  Filled 2011-06-12: qty 2.4

## 2011-06-12 MED ORDER — ASPIRIN 81 MG PO TABS: 81.0000 mg | ORAL_TABLET | Freq: Every day | ORAL | Status: DC

## 2011-06-12 MED ORDER — PHENYLEPHRINE HCL 10 MG/ML IJ SOLN
20.0000 mg | INTRAVENOUS | Status: DC | PRN
  Administered 2011-06-12: 10 ug/min via INTRAVENOUS

## 2011-06-12 MED ORDER — PANTOPRAZOLE SODIUM 40 MG IV SOLR
40.0000 mg | INTRAVENOUS | Status: DC
  Administered 2011-06-12: 40 mg via INTRAVENOUS
  Filled 2011-06-12 (×2): qty 40

## 2011-06-12 MED ORDER — INSULIN GLARGINE 100 UNIT/ML ~~LOC~~ SOLN
70.0000 [IU] | Freq: Every day | SUBCUTANEOUS | Status: DC
  Administered 2011-06-13 – 2011-06-18 (×6): 70 [IU] via SUBCUTANEOUS
  Filled 2011-06-12: qty 0.7
  Filled 2011-06-12: qty 3

## 2011-06-12 MED ORDER — INSULIN ASPART 100 UNIT/ML ~~LOC~~ SOLN: 16.0000 [IU] | Freq: Every day | SUBCUTANEOUS | Status: DC

## 2011-06-12 MED ORDER — ASPIRIN 81 MG PO CHEW
81.0000 mg | CHEWABLE_TABLET | Freq: Every day | ORAL | Status: DC
  Administered 2011-06-13 – 2011-06-24 (×12): 81 mg via ORAL
  Filled 2011-06-12 (×13): qty 1

## 2011-06-12 MED ORDER — OMEGA-3-ACID ETHYL ESTERS 1 G PO CAPS
4.0000 g | ORAL_CAPSULE | Freq: Every day | ORAL | Status: DC
  Administered 2011-06-13 – 2011-06-14 (×2): 4 g via ORAL
  Filled 2011-06-12 (×3): qty 4

## 2011-06-12 MED ORDER — MORPHINE SULFATE 10 MG/ML IJ SOLN
INTRAMUSCULAR | Status: AC
  Filled 2011-06-12: qty 1

## 2011-06-12 MED ORDER — INSULIN REGULAR HUMAN 100 UNIT/ML IJ SOLN: INTRAMUSCULAR | Status: DC

## 2011-06-12 MED ORDER — VANCOMYCIN HCL 1000 MG IV SOLR
2500.0000 mg | Freq: Once | INTRAVENOUS | Status: AC
  Administered 2011-06-12: 2500 mg via INTRAVENOUS
  Filled 2011-06-12: qty 2500

## 2011-06-12 MED ORDER — ONDANSETRON HCL 4 MG PO TABS: 4.0000 mg | ORAL_TABLET | Freq: Four times a day (QID) | ORAL | Status: DC | PRN

## 2011-06-12 MED ORDER — PANTOPRAZOLE SODIUM 40 MG IV SOLR
40.0000 mg | Freq: Every day | INTRAVENOUS | Status: DC
  Administered 2011-06-13 – 2011-06-15 (×3): 40 mg via INTRAVENOUS
  Filled 2011-06-12 (×3): qty 40

## 2011-06-12 MED ORDER — POLYETHYLENE GLYCOL 3350 17 G PO PACK
17.0000 g | PACK | Freq: Every day | ORAL | Status: DC | PRN
  Filled 2011-06-12: qty 1

## 2011-06-12 MED ORDER — VANCOMYCIN HCL 1000 MG IV SOLR
INTRAVENOUS | Status: AC
  Filled 2011-06-12: qty 1000

## 2011-06-12 MED ORDER — TOBRAMYCIN SULFATE 1.2 G IJ SOLR
INTRAMUSCULAR | Status: DC | PRN
  Administered 2011-06-12: 1.2 g

## 2011-06-12 MED ORDER — ONE-DAILY MULTI VITAMINS PO TABS: 1.0000 | ORAL_TABLET | Freq: Every day | ORAL | Status: DC

## 2011-06-12 MED ORDER — ALBUTEROL SULFATE HFA 108 (90 BASE) MCG/ACT IN AERS
4.0000 | INHALATION_SPRAY | RESPIRATORY_TRACT | Status: DC | PRN
  Filled 2011-06-12: qty 6.7

## 2011-06-12 MED ORDER — ACETAMINOPHEN 650 MG RE SUPP: 650.0000 mg | Freq: Four times a day (QID) | RECTAL | Status: DC | PRN

## 2011-06-12 MED ORDER — MORPHINE SULFATE 2 MG/ML IJ SOLN
1.0000 mg | INTRAMUSCULAR | Status: DC | PRN
  Administered 2011-06-13 – 2011-06-16 (×5): 2 mg via INTRAVENOUS
  Filled 2011-06-12 (×5): qty 1

## 2011-06-12 MED ORDER — VANCOMYCIN HCL 1000 MG IV SOLR
1500.0000 mg | INTRAVENOUS | Status: DC
  Filled 2011-06-12: qty 1500

## 2011-06-12 MED ORDER — BUPIVACAINE-EPINEPHRINE PF 0.25-1:200000 % IJ SOLN
INTRAMUSCULAR | Status: DC | PRN
  Administered 2011-06-12: 30 mL

## 2011-06-12 MED ORDER — PROPOFOL 10 MG/ML IV EMUL
5.0000 ug/kg/min | INTRAVENOUS | Status: DC
  Administered 2011-06-12: 10 ug/kg/min via INTRAVENOUS
  Filled 2011-06-12: qty 100

## 2011-06-12 MED ORDER — INSULIN ASPART 100 UNIT/ML ~~LOC~~ SOLN
18.0000 [IU] | Freq: Every day | SUBCUTANEOUS | Status: DC
  Administered 2011-06-13: 18 [IU] via SUBCUTANEOUS

## 2011-06-12 MED ORDER — INSULIN ASPART 100 UNIT/ML ~~LOC~~ SOLN
0.0000 [IU] | SUBCUTANEOUS | Status: DC
  Administered 2011-06-13: 7 [IU] via SUBCUTANEOUS
  Administered 2011-06-13: 2 [IU] via SUBCUTANEOUS
  Administered 2011-06-13 (×2): 4 [IU] via SUBCUTANEOUS
  Administered 2011-06-13: 7 [IU] via SUBCUTANEOUS
  Administered 2011-06-13: 4 [IU] via SUBCUTANEOUS
  Administered 2011-06-13: 2 [IU] via SUBCUTANEOUS
  Administered 2011-06-14: 4 [IU] via SUBCUTANEOUS
  Administered 2011-06-14: 7 [IU] via SUBCUTANEOUS
  Administered 2011-06-14: 4 [IU] via SUBCUTANEOUS
  Filled 2011-06-12: qty 3

## 2011-06-12 MED ORDER — MORPHINE SULFATE 10 MG/ML IJ SOLN: 1.0000 mg | INTRAMUSCULAR | Status: DC | PRN

## 2011-06-12 MED ORDER — EXENATIDE 5 MCG/0.02ML ~~LOC~~ SOPN: 5.0000 ug | PEN_INJECTOR | Freq: Every day | SUBCUTANEOUS | Status: DC

## 2011-06-12 MED ORDER — SODIUM CHLORIDE 0.9 % IV SOLN
INTRAVENOUS | Status: DC
  Administered 2011-06-13 – 2011-06-15 (×3): 100 mL/h via INTRAVENOUS
  Administered 2011-06-17 – 2011-06-18 (×2): via INTRAVENOUS
  Administered 2011-06-19: 1000 mL via INTRAVENOUS
  Administered 2011-06-19: 100 mL/h via INTRAVENOUS
  Administered 2011-06-20: 1000 mL via INTRAVENOUS
  Administered 2011-06-21: 100 mL/h via INTRAVENOUS
  Administered 2011-06-21 – 2011-06-24 (×6): via INTRAVENOUS

## 2011-06-12 MED ORDER — LACTATED RINGERS IV SOLN
INTRAVENOUS | Status: DC | PRN
  Administered 2011-06-12 (×2): via INTRAVENOUS

## 2011-06-12 MED ORDER — PREDNISONE 10 MG PO TABS
10.0000 mg | ORAL_TABLET | Freq: Every day | ORAL | Status: AC
  Administered 2011-06-13 – 2011-06-15 (×3): 10 mg via ORAL
  Filled 2011-06-12 (×3): qty 1

## 2011-06-12 MED ORDER — SODIUM CHLORIDE 0.9 % IV BOLUS (SEPSIS)
1000.0000 mL | Freq: Once | INTRAVENOUS | Status: AC
  Administered 2011-06-12: 1000 mL via INTRAVENOUS

## 2011-06-12 MED ORDER — PROPOFOL 10 MG/ML IV BOLUS
INTRAVENOUS | Status: DC | PRN
  Administered 2011-06-12: 200 mg via INTRAVENOUS

## 2011-06-12 MED ORDER — ALBUTEROL SULFATE (5 MG/ML) 0.5% IN NEBU
INHALATION_SOLUTION | RESPIRATORY_TRACT | Status: AC
  Administered 2011-06-12: 16:00:00
  Filled 2011-06-12: qty 1

## 2011-06-12 MED ORDER — BIOTENE DRY MOUTH MT LIQD
1.0000 "application " | Freq: Four times a day (QID) | OROMUCOSAL | Status: DC
  Administered 2011-06-13 (×4): 15 mL via OROMUCOSAL

## 2011-06-12 MED ORDER — ONDANSETRON HCL 4 MG/2ML IJ SOLN: 4.0000 mg | Freq: Four times a day (QID) | INTRAMUSCULAR | Status: DC | PRN

## 2011-06-12 MED ORDER — INSULIN ASPART 100 UNIT/ML ~~LOC~~ SOLN: 0.0000 [IU] | SUBCUTANEOUS | Status: DC

## 2011-06-12 MED ORDER — SODIUM CHLORIDE 0.9 % IV SOLN
INTRAVENOUS | Status: DC
  Administered 2011-06-12: 16:00:00 via INTRAVENOUS

## 2011-06-12 MED ORDER — DEXTROSE 5 % IV SOLN
1.0000 g | INTRAVENOUS | Status: DC
  Administered 2011-06-12 – 2011-06-13 (×2): 1 g via INTRAVENOUS
  Filled 2011-06-12 (×3): qty 10

## 2011-06-12 MED ORDER — MORPHINE SULFATE 4 MG/ML IJ SOLN
4.0000 mg | Freq: Once | INTRAMUSCULAR | Status: AC
  Administered 2011-06-12: 4 mg via INTRAVENOUS
  Filled 2011-06-12: qty 1

## 2011-06-12 MED ORDER — INSULIN ASPART 100 UNIT/ML ~~LOC~~ SOLN: 0.0000 [IU] | Freq: Three times a day (TID) | SUBCUTANEOUS | Status: DC

## 2011-06-12 MED ORDER — VANCOMYCIN HCL 1000 MG IV SOLR
INTRAVENOUS | Status: DC | PRN
  Administered 2011-06-12: 1000 mg

## 2011-06-12 MED ORDER — CISATRACURIUM BESYLATE 2 MG/ML IV SOLN
INTRAVENOUS | Status: DC | PRN
  Administered 2011-06-12: 9 mg via INTRAVENOUS

## 2011-06-12 MED ORDER — CHLORHEXIDINE GLUCONATE 0.12 % MT SOLN
15.0000 mL | Freq: Two times a day (BID) | OROMUCOSAL | Status: DC
  Administered 2011-06-12 – 2011-06-13 (×2): 15 mL via OROMUCOSAL
  Filled 2011-06-12 (×5): qty 15

## 2011-06-12 MED ORDER — SUCCINYLCHOLINE CHLORIDE 20 MG/ML IJ SOLN
INTRAMUSCULAR | Status: DC | PRN
  Administered 2011-06-12: 100 mg via INTRAVENOUS

## 2011-06-12 MED ORDER — ACETAMINOPHEN 325 MG PO TABS
650.0000 mg | ORAL_TABLET | Freq: Four times a day (QID) | ORAL | Status: DC | PRN
  Administered 2011-06-22 – 2011-06-23 (×2): 650 mg via ORAL
  Filled 2011-06-12 (×2): qty 2

## 2011-06-12 MED ORDER — LACTATED RINGERS IV SOLN: INTRAVENOUS | Status: DC

## 2011-06-12 MED ORDER — IPRATROPIUM-ALBUTEROL 18-103 MCG/ACT IN AERO
4.0000 | INHALATION_SPRAY | Freq: Four times a day (QID) | RESPIRATORY_TRACT | Status: DC
  Administered 2011-06-13 (×2): 4 via RESPIRATORY_TRACT
  Filled 2011-06-12: qty 14.7

## 2011-06-12 MED ORDER — INSULIN ASPART 100 UNIT/ML ~~LOC~~ SOLN: 0.0000 [IU] | Freq: Every day | SUBCUTANEOUS | Status: DC

## 2011-06-12 MED ORDER — NICOTINE 14 MG/24HR TD PT24
14.0000 mg | MEDICATED_PATCH | Freq: Every day | TRANSDERMAL | Status: DC | PRN
  Filled 2011-06-12: qty 1

## 2011-06-12 MED ORDER — ENOXAPARIN SODIUM 40 MG/0.4ML ~~LOC~~ SOLN
40.0000 mg | SUBCUTANEOUS | Status: DC
  Administered 2011-06-13 – 2011-06-23 (×11): 40 mg via SUBCUTANEOUS
  Filled 2011-06-12 (×15): qty 0.4

## 2011-06-12 MED ORDER — ADULT MULTIVITAMIN W/MINERALS CH
1.0000 | ORAL_TABLET | Freq: Every day | ORAL | Status: DC
  Administered 2011-06-13 – 2011-06-24 (×12): 1 via ORAL
  Filled 2011-06-12 (×13): qty 1

## 2011-06-12 MED ORDER — BUPIVACAINE-EPINEPHRINE PF 0.25-1:200000 % IJ SOLN
INTRAMUSCULAR | Status: AC
  Filled 2011-06-12: qty 30

## 2011-06-12 MED ORDER — LIDOCAINE HCL 2 % IJ SOLN
INTRAMUSCULAR | Status: AC
  Administered 2011-06-12: 16:00:00
  Filled 2011-06-12: qty 1

## 2011-06-12 MED ORDER — HYDROCODONE-ACETAMINOPHEN 5-325 MG PO TABS
1.0000 | ORAL_TABLET | ORAL | Status: DC | PRN
  Administered 2011-06-13: 1 via ORAL
  Administered 2011-06-14: 2 via ORAL
  Administered 2011-06-14 – 2011-06-20 (×8): 1 via ORAL
  Administered 2011-06-20: 2 via ORAL
  Administered 2011-06-20: 1 via ORAL
  Administered 2011-06-21 – 2011-06-23 (×6): 2 via ORAL
  Filled 2011-06-12: qty 1
  Filled 2011-06-12: qty 2
  Filled 2011-06-12: qty 1
  Filled 2011-06-12: qty 2
  Filled 2011-06-12 (×3): qty 1
  Filled 2011-06-12: qty 2
  Filled 2011-06-12 (×2): qty 1
  Filled 2011-06-12 (×3): qty 2
  Filled 2011-06-12 (×3): qty 1
  Filled 2011-06-12: qty 2
  Filled 2011-06-12: qty 1
  Filled 2011-06-12: qty 2

## 2011-06-12 MED ORDER — PAROXETINE HCL 10 MG PO TABS
10.0000 mg | ORAL_TABLET | Freq: Every day | ORAL | Status: DC
  Administered 2011-06-13 – 2011-06-18 (×6): 10 mg via ORAL
  Filled 2011-06-12 (×8): qty 1

## 2011-06-12 MED ORDER — INSULIN ASPART 100 UNIT/ML ~~LOC~~ SOLN
16.0000 [IU] | Freq: Three times a day (TID) | SUBCUTANEOUS | Status: DC
  Filled 2011-06-12: qty 3

## 2011-06-12 MED ORDER — MORPHINE SULFATE 10 MG/ML IJ SOLN
INTRAMUSCULAR | Status: DC | PRN
  Administered 2011-06-12: 3 mg via INTRAVENOUS
  Administered 2011-06-12 (×2): 2 mg via INTRAVENOUS

## 2011-06-12 MED ORDER — BENZONATATE 100 MG PO CAPS
100.0000 mg | ORAL_CAPSULE | Freq: Three times a day (TID) | ORAL | Status: DC | PRN
  Filled 2011-06-12: qty 1

## 2011-06-12 MED ORDER — EZETIMIBE-SIMVASTATIN 10-40 MG PO TABS
1.0000 | ORAL_TABLET | Freq: Every day | ORAL | Status: DC
  Administered 2011-06-13 – 2011-06-14 (×2): 1 via ORAL
  Filled 2011-06-12 (×4): qty 1

## 2011-06-12 MED ORDER — PAROXETINE HCL 10 MG PO TABS: 10.0000 mg | ORAL_TABLET | Freq: Two times a day (BID) | ORAL | Status: DC

## 2011-06-12 MED ORDER — SODIUM CHLORIDE 0.9 % IR SOLN
Status: DC | PRN
  Administered 2011-06-12: 3000 mL

## 2011-06-12 MED ORDER — EXENATIDE 5 MCG/0.02ML ~~LOC~~ SOPN
5.0000 ug | PEN_INJECTOR | Freq: Every day | SUBCUTANEOUS | Status: DC
  Filled 2011-06-12: qty 0.02

## 2011-06-12 SURGICAL SUPPLY — 57 items
BAG SPEC THK2 15X12 ZIP CLS (MISCELLANEOUS) ×1
BAG ZIPLOCK 12X15 (MISCELLANEOUS) ×2 IMPLANT
BANDAGE ACE 4 STERILE (GAUZE/BANDAGES/DRESSINGS) ×1 IMPLANT
BANDAGE ELASTIC 6 VELCRO ST LF (GAUZE/BANDAGES/DRESSINGS) ×2 IMPLANT
BANDAGE ESMARK 6X9 LF (GAUZE/BANDAGES/DRESSINGS) ×1 IMPLANT
BANDAGE GAUZE ELAST BULKY 4 IN (GAUZE/BANDAGES/DRESSINGS) ×2 IMPLANT
BNDG CMPR 9X6 STRL LF SNTH (GAUZE/BANDAGES/DRESSINGS) ×1
BNDG ESMARK 6X9 LF (GAUZE/BANDAGES/DRESSINGS) ×2
CLOTH BEACON ORANGE TIMEOUT ST (SAFETY) ×2 IMPLANT
CONT SPECI 4OZ STER CLIK (MISCELLANEOUS) ×1 IMPLANT
CUFF TOURN SGL QUICK 34 (TOURNIQUET CUFF) ×2
CUFF TRNQT CYL 34X4X40X1 (TOURNIQUET CUFF) ×1 IMPLANT
DRAPE EXTREMITY T 121X128X90 (DRAPE) ×2 IMPLANT
DRAPE LG THREE QUARTER DISP (DRAPES) ×1 IMPLANT
DRAPE POUCH INSTRU U-SHP 10X18 (DRAPES) ×2 IMPLANT
DRAPE U-SHAPE 47X51 STRL (DRAPES) ×2 IMPLANT
DRSG ADAPTIC 3X8 NADH LF (GAUZE/BANDAGES/DRESSINGS) ×1 IMPLANT
DRSG EMULSION OIL 3X16 NADH (GAUZE/BANDAGES/DRESSINGS) ×1 IMPLANT
DURAPREP 26ML APPLICATOR (WOUND CARE) ×2 IMPLANT
ELECT REM PT RETURN 9FT ADLT (ELECTROSURGICAL) ×2
ELECTRODE REM PT RTRN 9FT ADLT (ELECTROSURGICAL) ×1 IMPLANT
EVACUATOR 1/8 PVC DRAIN (DRAIN) ×1 IMPLANT
FACESHIELD LNG OPTICON STERILE (SAFETY) ×10 IMPLANT
GAUZE SPONGE 4X4 12PLY STRL LF (GAUZE/BANDAGES/DRESSINGS) ×1 IMPLANT
GLOVE BIO SURGEON STRL SZ7.5 (GLOVE) ×2 IMPLANT
GLOVE SURG ORTHO 7.0 STRL STRW (GLOVE) ×2 IMPLANT
GLOVE SURG ORTHO 8.0 STRL STRW (GLOVE) ×2 IMPLANT
GOWN STRL REIN XL XLG (GOWN DISPOSABLE) ×4 IMPLANT
HANDPIECE INTERPULSE COAX TIP (DISPOSABLE) ×2
IMMOBILIZER KNEE 20 (SOFTGOODS) ×2
IMMOBILIZER KNEE 20 THIGH 36 (SOFTGOODS) ×1 IMPLANT
INSERT TIB LCS RP LRG 17.5 (Knees) ×1 IMPLANT
KIT BASIN OR (CUSTOM PROCEDURE TRAY) ×2 IMPLANT
KIT STIMULAN RAPID CURE  10CC (Orthopedic Implant) ×2 IMPLANT
KIT STIMULAN RAPID CURE 10CC (Orthopedic Implant) ×2 IMPLANT
MANIFOLD NEPTUNE II (INSTRUMENTS) ×2 IMPLANT
NS IRRIG 1000ML POUR BTL (IV SOLUTION) ×2 IMPLANT
PACK TOTAL JOINT (CUSTOM PROCEDURE TRAY) ×2 IMPLANT
PAD ABD 7.5X8 STRL (GAUZE/BANDAGES/DRESSINGS) ×4 IMPLANT
PADDING CAST COTTON 6X4 STRL (CAST SUPPLIES) ×2 IMPLANT
POSITIONER SURGICAL ARM (MISCELLANEOUS) ×2 IMPLANT
SET HNDPC FAN SPRY TIP SCT (DISPOSABLE) ×1 IMPLANT
SPONGE GAUZE 4X4 12PLY (GAUZE/BANDAGES/DRESSINGS) ×2 IMPLANT
SPONGE LAP 18X18 X RAY DECT (DISPOSABLE) ×1 IMPLANT
STAPLER VISISTAT 35W (STAPLE) ×2 IMPLANT
SUCTION FRAZIER 12FR DISP (SUCTIONS) ×2 IMPLANT
SUT ETHIBOND NAB CT1 #1 30IN (SUTURE) ×6 IMPLANT
SUT PDS AB 0 CT1 36 (SUTURE) ×3 IMPLANT
SUT PDS AB 1 CT1 27 (SUTURE) ×6 IMPLANT
SUT VIC AB 2-0 CT1 27 (SUTURE)
SUT VIC AB 2-0 CT1 TAPERPNT 27 (SUTURE) IMPLANT
SWAB COLLECTION DEVICE MRSA (MISCELLANEOUS) ×2 IMPLANT
TOWEL OR 17X26 10 PK STRL BLUE (TOWEL DISPOSABLE) ×6 IMPLANT
TRAY FOLEY CATH 14FRSI W/METER (CATHETERS) ×2 IMPLANT
TUBE ANAEROBIC SPECIMEN COL (MISCELLANEOUS) ×2 IMPLANT
WATER STERILE IRR 1500ML POUR (IV SOLUTION) ×1 IMPLANT
WRAP KNEE MAXI GEL POST OP (GAUZE/BANDAGES/DRESSINGS) ×2 IMPLANT

## 2011-06-12 NOTE — Consult Note (Signed)
Reason for Consult:pyarthrosis left total knee Referring Physician: ED [facility]  [name] is an 71 y.o. male.  HPI: Pt c/o bilateral knee edema and severe pain (left worse than right) for the past 3 days. Pain aggravated by flexion. Has not taken any analgesics. Has edema of ankles as well. No trauma. No associated fever. His PCP drew off some fluid from left knee yesterday afternoon (sent for culture) which improved sx, but swelling returned last night. Pt contacted our office this am and they had already called the ambulance to take him to [facility] ED for increased knee pain, return of fluid on knee. Pt attributes his sx to prednisone. Per prior chart, pt admitted for CP/dyspnea 3/1-3/5 and diagnosed w/ pleurisy and bronchitis. Discharged home on steroid taper which he has been compliant with. He had total knee done by Dr. Cleophas Dunker in (wife reports) 2008.     Past Medical History  Diagnosis Date  . Gout   . Depression   . Diabetes mellitus   . Unspecified essential hypertension   . Hypercholesteremia     IIA  . CAD (coronary artery disease)   . Normocytic anemia     Chronic  . Colitis, ulcerative     NOS  . Personal hx-rectal/anal malignancy   . Urinary incontinence   . Benign prostatic hypertrophy   . H/O ETOH abuse 06/06/2011  . 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 History of left knee surgery   . Pleurisy     Past Surgical History  Procedure Date  . Colon resection   . Transurethral resection of prostate     Family History  Problem Relation Age of Onset  . Dementia Mother   . COPD Father     Social History:  reports that he quit smoking about 41 years ago. His smoking use included Cigarettes. He has a 30 pack-year smoking history. His smokeless tobacco use includes Chew. He reports that he does not drink alcohol. His drug history not on file.  Allergies:  Allergies  Allergen Reactions  . Fentanyl   . Hydromorphone Hcl   .  Lorazepam   . Oxycodone-Acetaminophen     Medications: reviewed in chart  Results for orders placed during the hospital encounter of 06/12/11 (from the past 48 hour(s))  CBC     Status: Abnormal   Collection Time   06/12/11 11:20 AM      Component Value Range Comment   WBC 17.7 (*) 4.0 - 10.5 (K/uL)    RBC 3.84 (*) 4.22 - 5.81 (MIL/uL)    Hemoglobin 12.1 (*) 13.0 - 17.0 (g/dL)    HCT 16.1 (*) 09.6 - 52.0 (%)    MCV 90.4  78.0 - 100.0 (fL)    MCH 31.5  26.0 - 34.0 (pg)    MCHC 34.9  30.0 - 36.0 (g/dL)    RDW 04.5  40.9 - 81.1 (%)    Platelets 157  150 - 400 (K/uL)   DIFFERENTIAL     Status: Abnormal   Collection Time   06/12/11 11:20 AM      Component Value Range Comment   Neutrophils Relative 88 (*) 43 - 77 (%)    Neutro Abs 15.6 (*) 1.7 - 7.7 (K/uL)    Lymphocytes Relative 4 (*) 12 - 46 (%)    Lymphs Abs 0.8  0.7 - 4.0 (K/uL)    Monocytes Relative 7  3 - 12 (%)    Monocytes Absolute 1.3 (*)  0.1 - 1.0 (K/uL)    Eosinophils Relative 0  0 - 5 (%)    Eosinophils Absolute 0.0  0.0 - 0.7 (K/uL)    Basophils Relative 0  0 - 1 (%)    Basophils Absolute 0.0  0.0 - 0.1 (K/uL)   BASIC METABOLIC PANEL     Status: Abnormal   Collection Time   06/12/11 11:20 AM      Component Value Range Comment   Sodium 130 (*) 135 - 145 (mEq/L)    Potassium 4.4  3.5 - 5.1 (mEq/L)    Chloride 94 (*) 96 - 112 (mEq/L)    CO2 26  19 - 32 (mEq/L)    Glucose, Bld 157 (*) 70 - 99 (mg/dL)    BUN 41 (*) 6 - 23 (mg/dL)    Creatinine, Ser 1.61 (*) 0.50 - 1.35 (mg/dL)    Calcium 9.2  8.4 - 10.5 (mg/dL)    GFR calc non Af Amer 32 (*) >90 (mL/min)    GFR calc Af Amer 37 (*) >90 (mL/min)   CELL COUNT + DIFF,  W/ CRYST-SYNVL FLD     Status: Abnormal   Collection Time   06/12/11 11:40 AM      Component Value Range Comment   Color, Synovial MILKY (*) YELLOW     Appearance-Synovial OPAQUE (*) CLEAR     Crystals, Fluid NO CRYSTALS SEEN      WBC, Synovial UNABLE TO PERFORM COUNT DUE TO CLOT IN SPECIMEN  0 - 200 (/cu  mm)    Neutrophil, Synovial 90 (*) 0 - 25 (%)    Lymphocytes-Synovial Fld 5  0 - 20 (%)    Monocyte-Macrophage-Synovial Fluid 5 (*) 50 - 90 (%)    Eosinophils-Synovial 0  0 - 1 (%)    Other Cells-SYN        Value: INTRACELLULAR AND EXTRACELLULAR BACTERIA. PLEASE CORRELATE WITH MICROBIOLOGY  SEDIMENTATION RATE     Status: Abnormal   Collection Time   06/12/11 12:00 PM      Component Value Range Comment   Sed Rate 88 (*) 0 - 16 (mm/hr)   CELL COUNT + DIFF,  W/ CRYST-SYNVL FLD     Status: Abnormal   Collection Time   06/12/11 12:10 PM      Component Value Range Comment   Color, Synovial RED (*) YELLOW     Appearance-Synovial BLOODY (*) CLEAR     Crystals, Fluid NO CRYSTALS SEEN      WBC, Synovial UNABLE TO PERFORM COUNT DUE TO CLOT IN SPECIMEN  0 - 200 (/cu mm)    Neutrophil, Synovial 90 (*) 0 - 25 (%)    Lymphocytes-Synovial Fld 1  0 - 20 (%)    Monocyte-Macrophage-Synovial Fluid 9 (*) 50 - 90 (%)    Eosinophils-Synovial 0  0 - 1 (%)     Dg Chest 2 View  06/12/2011  *RADIOLOGY REPORT*  Clinical Data:  Shortness of breath, cough and chest pain.  CHEST - 2 VIEW  Comparison: 06/04/2011  Findings: The heart size and mediastinal contours are within normal limits.  Both lungs are clear.  The visualized skeletal structures are unremarkable.  IMPRESSION: No active disease.  Original Report Authenticated By: Reola Calkins, M.D.   Dg Ankle Complete Left  06/12/2011  *RADIOLOGY REPORT*  Clinical Data: Pain and swelling.  Previous surgery.  LEFT ANKLE COMPLETE - 3+ VIEW  Comparison: 08/17/2003  Findings: There has been fracture of the fibular plate to.  There are  also fractures of the lowest two screws.  There is refracture or nonunion.  There is degenerative disease of the ankle joint with osteophytes, joint effusion and small intra-articular loose bodies.  Talar dome appears smooth.  IMPRESSION: Fracture of the fibular plate and lowest two screws.  Refracture or nonunion of the bone.  Degenerative  disease of the ankle joint with osteophyte formation, joint effusion and probably small intra-articular loose bodies.  Original Report Authenticated By: Thomasenia Sales, M.D.   Dg Knee Complete 4 Views Left  06/12/2011  *RADIOLOGY REPORT*  Clinical Data: Pain and swelling.  Fluid aspiration.  LEFT KNEE - COMPLETE 4+ VIEW  Comparison: None.  Findings: There is a joint effusion.  A few dots of a air are notable in the suprapatellar region, presumably related to the recent aspiration.  Components of the total knee replacement appear unremarkable.  IMPRESSION: Previous total knee replacement.  Joint effusion.  A few dots of a air in the suprapatellar region presumably related to the recent aspiration.  Original Report Authenticated By: Thomasenia Sales, M.D.   Dg Knee Complete 4 Views Right  06/12/2011  *RADIOLOGY REPORT*  Clinical Data: Pain.  Fluid aspiration.  RIGHT KNEE - COMPLETE 4+ VIEW  Comparison: None.  Findings: There is advanced tricompartmental osteoarthritis with joint space narrowing and osteophyte formation.  There is a small amount of joint fluid.  No visible intra-articular loose body.  No fracture or other focal lesion.  Arterial calcification is noted.  IMPRESSION: Advanced tricompartmental osteoarthritis.  Small joint effusion.  Original Report Authenticated By: Thomasenia Sales, M.D.    ROS Blood pressure 107/62, pulse 99, temperature 98.5 F (36.9 C), temperature source Oral, resp. rate 20, SpO2 97.00%. All other systems reviewed today were negative.  Physical Exam  Constitutional: He is oriented to person, place, and time. He appears well-developed and well-nourished.  HENT:  Head: Normocephalic and atraumatic.  Neck: Normal range of motion.  Musculoskeletal: He exhibits edema and tenderness.       Right knee: He exhibits swelling and effusion. He exhibits no erythema. tenderness found. Medial joint line and lateral joint line tenderness noted.       Left knee: He exhibits swelling  and effusion. He exhibits no erythema. tenderness found. Medial joint line and lateral joint line tenderness noted.  Neurological: He is alert and oriented to person, place, and time.  Skin: Skin is warm and dry. No rash noted.  Psychiatric: He has a normal mood and affect.    Assessment/Plan: Pyarthrosis left total knee  Imaging shows no sign of loosening of implant.  Patient admitted to medicine service, started on vancomycin, plan is for OR for left knee I&D poly exchange, possible placement abx beads.  Suggest ID consult if not already done.  We will make Dr. Cleophas Dunker aware of patient.    Margart Sickles 06/12/2011, 3:19 PM

## 2011-06-12 NOTE — Progress Notes (Signed)
Air passing over cuff, ETT resecured at [employer]

## 2011-06-12 NOTE — Op Note (Signed)
Dictated [phone]

## 2011-06-12 NOTE — Anesthesia Postprocedure Evaluation (Signed)
  Anesthesia Post-op Note  Patient: [name]  Procedure(s) Performed: Procedure(s) (LRB): IRRIGATION AND DEBRIDEMENT KNEE WITH POLY EXCHANGE (Left)  Patient Location: SICU  Anesthesia Type: General  Level of Consciousness: sedated  Airway and Oxygen Therapy: On ventilator  Post-op Pain: mild  Post-op Assessment: Post-op Vital signs reviewed, Patient's Cardiovascular Status Stable, Respiratory Function Stable on vent,  and No signs of Nausea or vomiting  Post-op Vital Signs: stable  Complications: No apparent anesthesia complications

## 2011-06-12 NOTE — ED Notes (Signed)
Patient transported to X-ray 

## 2011-06-12 NOTE — ED Notes (Signed)
EMS called to home.  Found patient laying in bed.  Patient complains of difficulty with breathing.  And pain in bilateral knees and ankles.  Patient respiratory presents with wheezing.   Family states urine dark.  Patient has colostomy.  IV right hand 20g  With 200 cc bolus

## 2011-06-12 NOTE — Progress Notes (Signed)
ANTIBIOTIC CONSULT NOTE - INITIAL  Pharmacy Consult for Vancomycin Indication: Concern for possible joint infection.  Allergies  Allergen Reactions  . Fentanyl   . Hydromorphone Hcl   . Lorazepam   . Oxycodone-Acetaminophen     Patient Measurements:   Body Weight: 122 kg (06/05/11)  Vital Signs: Temp: 98.5 F (36.9 C) (03/09 0953) Temp src: Oral (03/09 0953) BP: 107/62 mmHg (03/09 0953) Pulse Rate: 99  (03/09 0953)  Labs:  Basename 06/12/11 1120  WBC 17.7*  HGB 12.1*  PLT 157  LABCREA --  CREATININE 1.98*   The CrCl is unknown because both a height and weight (above a minimum accepted value) are required for this calculation. No results found for this basename: VANCOTROUGH:2,VANCOPEAK:2,VANCORANDOM:2,GENTTROUGH:2,GENTPEAK:2,GENTRANDOM:2,TOBRATROUGH:2,TOBRAPEAK:2,TOBRARND:2,AMIKACINPEAK:2,AMIKACINTROU:2,AMIKACIN:2, in the last 72 hours   Microbiology: No results found for this or any previous visit (from the past 720 hour(s)).  Medical History: Past Medical History  Diagnosis Date  . Gout   . Depression   . Diabetes mellitus   . Unspecified essential hypertension   . Hypercholesteremia     IIA  . CAD (coronary artery disease)   . Normocytic anemia     Chronic  . Colitis, ulcerative     NOS  . Personal hx-rectal/anal malignancy   . Urinary incontinence   . Benign prostatic hypertrophy   . H/O ETOH abuse 06/06/2011  . 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 History of left knee surgery   . Pleurisy     Assessment: 70yo obese male to begin ceftriaxone and vancomycin for possible joint infection .  SCr 1.98 (elevated), CrCl(N)~34 mL/min.  Goal of Therapy:  Vancomycin trough level 15-20 mcg/ml  Plan:  Vancomycin 2500 mg IV loading dose followed by 1500 mg IV q24hr. F/u Scr, may need to adjust frequency of dosing interval based on changing SCr.  Clance Boll 06/12/2011,2:49 PM

## 2011-06-12 NOTE — Transfer of Care (Signed)
Immediate Anesthesia Transfer of Care Note  Patient: [name]  Procedure(s) Performed: Procedure(s) (LRB): IRRIGATION AND DEBRIDEMENT KNEE WITH POLY EXCHANGE (Left)  Patient Location: ICU  Anesthesia Type: General  Level of Consciousness: unresponsive  Airway & Oxygen Therapy: Patient remains intubated per anesthesia plan  Post-op Assessment: Post -op Vital signs reviewed and stable  Post vital signs: Reviewed and stable  Complications: No apparent anesthesia complications

## 2011-06-12 NOTE — Consult Note (Signed)
Name: [name] MRN: [id number] DOB: [birth year]    LOS: 0  PCCM ADMISSION NOTE  History of Present Illness: pt is a 71 y/o M with extensive PMHx including CAD, DM, alcohol abuse, DL, Hx of colon ca s/p colectomy and ileostomy, who was recently admitted due to acute bronchitis treated with antibiotics and steroid taper. Pt was able to go home and developed swelling and pain in the L knee. Pt was seen at the ortho urgent care and L knee was tapped with elevated WBC count. Pt was admitted to [facility] and underwent I/D on the L knee by orthopedics today and remained intubated after the procedure due to low O2 sats. CXR post procedure showed selective intubation of the R mainstem bronchus and the tube was pulled out a few cm and the L lung reexpanded. PCCM service was consulted for further vent management due to acute resp failure.  Lines / Drains: Peripheral access  Cultures: L knee Blood cultures  Antibiotics: Vancomycin Ceftriaxone     Past Medical History  Diagnosis Date  . Gout   . Depression   . Diabetes mellitus   . Unspecified essential hypertension   . Hypercholesteremia     IIA  . CAD (coronary artery disease)   . Normocytic anemia     Chronic  . Colitis, ulcerative     NOS  . Personal hx-rectal/anal malignancy   . Urinary incontinence   . Benign prostatic hypertrophy   . H/O ETOH abuse 06/06/2011  . 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 History of left knee surgery   . Pleurisy   . Cancer 2006    bladder   Past Surgical History  Procedure Date  . Colon resection   . Transurethral resection of prostate   . Abdominal aortic aneurysm repair 1992   Prior to Admission medications   Medication Sig Start Date End Date Taking? Authorizing Provider  aspirin 81 MG tablet Take 81 mg by mouth daily.     Yes Historical Provider, MD  benzonatate (TESSALON) 100 MG capsule Take 1 capsule (100 mg total) by mouth 3 (three) times daily  as needed for cough. 06/08/11 06/15/11 Yes Christina P Rama, MD  CINNAMON PO Take 1,000 mg by mouth daily.   Yes Historical Provider, MD  exenatide (BYETTA) 5 MCG/0.02ML SOLN Inject 5 mcg into the skin daily.   Yes Historical Provider, MD  ezetimibe-simvastatin (VYTORIN) 10-40 MG per tablet Take 1 tablet by mouth at bedtime.   Yes Historical Provider, MD  insulin aspart (NOVOLOG) 100 UNIT/ML injection Inject 16-18 Units into the skin 3 (three) times daily before meals. Breakfast 16 units; Lunch 16-18 units;  Dinner 18 units   Yes Historical Provider, MD  insulin glargine (LANTUS) 100 UNIT/ML injection Inject 70 Units into the skin at bedtime. 06/08/11  Yes Christina P Rama, MD  losartan (COZAAR) 100 MG tablet Take 100 mg by mouth daily.   Yes Historical Provider, MD  Multiple Vitamin (MULTIVITAMIN) tablet Take 1 tablet by mouth daily.     Yes Historical Provider, MD  naproxen sodium (ANAPROX) 220 MG tablet Take 220 mg by mouth daily.    Yes Historical Provider, MD  omega-3 acid ethyl esters (LOVAZA) 1 G capsule Take 4 g by mouth daily.   Yes Historical Provider, MD  PARoxetine (PAXIL) 20 MG tablet Take 10-20 mg by mouth 2 (two) times daily. 1 tab in am, 0.5 tab in pm   Yes Historical Provider, MD  predniSONE (STERAPRED UNI-PAK) 10 MG tablet Take 6 tabs 06/09/11, decrease by 10 mg daily until off. 06/08/11 06/18/11 Yes Maryruth Bun Rama, MD   Allergies Allergies  Allergen Reactions  . Fentanyl   . Hydromorphone Hcl   . Lorazepam   . Oxycodone-Acetaminophen     Family History Family History  Problem Relation Age of Onset  . Dementia Mother   . COPD Father     Social History  reports that he quit smoking about 41 years ago. His smoking use included Cigarettes. He has a 30 pack-year smoking history. His smokeless tobacco use includes Chew. He reports that he does not drink alcohol. His drug history not on file.  Review Of Systems  Patient unable to provide  Vital Signs: Temp:  [98.3 F (36.8  C)-98.8 F (37.1 C)] 98.6 F (37 C) (03/09 2100) Pulse Rate:  [89-105] 89  (03/09 2300) Resp:  [12-27] 27  (03/09 2300) BP: (80-164)/(46-74) 128/51 mmHg (03/09 2300) SpO2:  [74 %-100 %] 100 % (03/09 2300) FiO2 (%):  [100 %] 100 % (03/09 2200) Weight:  [121.1 kg (266 lb 15.6 oz)-123.8 kg (272 lb 14.9 oz)] 123.8 kg (272 lb 14.9 oz) (03/09 2200) I/O last 3 completed shifts: In: -  Out: 925 [Urine:825; Stool:100]  Physical Examination: General:  Intubated, mechanically ventilated, no acute distress Neuro:  Sedated, synchronous, nonfocal HEENT:  PERRL, pink conjunctivae, moist membranes Neck:  Supple, no JVD   Cardiovascular:  RRR, no M/R/G Lungs:  Bilateral breath sounds with no wheezes, rhonchi or crackles. Abdomen:  Soft, nontender, nondistended, bowel sounds present Musculoskeletal:  Moves all extremities, no pedal edema Skin:  No rash  Ventilator settings: Vent Mode:  [-] PRVC FiO2 (%):  [100 %] 100 % Set Rate:  [16 bmp] 16 bmp Vt Set:  [640 mL] 640 mL PEEP:  [5 cmH20] 5 cmH20 Plateau Pressure:  [26 cmH20] 26 cmH20  Labs and Imaging:  Reviewed.  Please refer to the Assessment and Plan section for relevant results.  ASSESSMENT AND PLAN  NEUROLOGIC Sedated for vent support. P: -->  Versed / Fentanyl gtt to goal RASS 0 to -1 -->  Daily WUA  PULMONARY No results found for this basename: PHART:5,PCO2:5,PCO2ART:5,PO2ART:5,HCO3:5,O2SAT:5 in the last 168 hours A:  Acute resp failure CXR after surgical procedure with selective intubation Repeat CXR with reexpansion of L lung with residual infiltrates P: -->  Full mechanical support -->  Goal pH>7.30, goal SpO2>92 -->  Follow up ABG -->  Follow up CXR -->  Bronchodilators, finish steroid taper -->  Daily SBT, stop sedation in the morning -->  respiratory cultures pending - On vanc and Ceftriaxone   CARDIOVASCULAR No results found for this basename: TROPONINI:5,LATICACIDVEN:5, O2SATVEN:5,PROBNP:5 in the last 168  hours P: -->  NS boluses to goal CVP>10 -->  Trend sepsis markers -->  Hemodynamically stable  RENAL  Lab 06/12/11 1120 06/08/11 0425 06/07/11 0420 06/06/11 0520  NA 130* 136 133* 132*  K 4.4 4.5 -- --  CL 94* 99 97 100  CO2 26 27 26 24   BUN 41* 38* 37* 36*  CREATININE 1.98* 1.17 1.11 1.21  CALCIUM 9.2 9.7 9.8 9.8  MG -- -- -- --  PHOS -- -- -- --   A:  Acute on CKD P: Likely due to DM, HTN and intravasc vol depletion Pt has also been having low oral intake due to septic arthritis -->  BMP, Mg, Phos in AM   GASTROINTESTINAL  Lab 06/06/11 0520  AST 18  ALT 20  ALKPHOS 46  BILITOT 0.4  PROT 6.8  ALBUMIN 3.4*   A:  Hx of colon ca s/p ileostomy P: Ileostomy with normal output. *  INFECTIOUS  Lab 06/12/11 1120 06/08/11 0425 06/07/11 0420 06/06/11 0520  WBC 17.7* 12.8* 11.9* 11.8*  PROCALCITON -- -- -- --   A:  Septic arthritis s/p I/D - Hx of recent acute bronchitis P: -->  L knee/R knee cultures pending - Resp cultures and blood cultures pending - On vanc and Ceftriaxone  ENDOCRINE  Lab 06/12/11 2059 06/12/11 1723 06/08/11 1131 06/08/11 0737 06/07/11 2231  GLUCAP 200* 243* 252* 197* 271*   A:  DM P: -->  ICU Hyperglycemia protocol  BEST PRACTICE / DISPOSITION -->  ICU status under PCCM -->  Full code -->  NPO -->  Enoxaparin, adjusted per kidney function -->  Protonix IV for GI Px -->  Ventilator bundle -->  Family updated at bedside  The patient is critically ill with multiple organ systems failure and requires high complexity decision making for assessment and support, frequent evaluation and titration of therapies, application of advanced monitoring technologies and extensive interpretation of multiple databases. Critical Care Time devoted to patient care services described in this note is 60utes.  Juanda Chance, MD Pulmonary and Critical Care Medicine [facility] Tel:  [phone] Pager: [phone]  06/12/2011, 11:52  PM

## 2011-06-12 NOTE — H&P (Signed)
Patient's PCP: Ailene Ravel, MD, MD  Chief Complaint: Bilateral knee pain swelling left greater than right  History of Present Illness: [name] is a 71 y.o. Caucasian male with history of depression, diabetes, hypertension, hypercholesterolemia, coronary disease, anemia and due to chronic disease, chronic kidney disease stage III, history of urinary incontinence, history of ulcerative colitis status post colectomy with colostomy bag, history of gout, history of left knee replacement in 2009 he presents with the above complaints.  Patient was recently hospitalized from 06/04/2011 through 06/08/2011 with chest pain and bronchitis.  Chest pain was thought to be due to bronchitis.  Patient was started on steroids and was discharged on 06/08/2011 with steroid taper.  Subsequently after discharge on 06/09/2011 patient had left knee pain.  He reported that his knee pain has been increasingly getting worse and had gone to his primary care physician's office on 06/11/2011 and had his left knee tapped.  During this time patient also noted increasing pain in left ankle and during the night of 06/11/2011 had right knee pain.  As a result he presented to the hospital for further evaluation.  He denies any recent fevers but has noted chills.  Denies any nausea or vomiting.  Does complain of shortness of breath from recent bronchitis but is improved.  Does complain of chest pain from bronchitis, reports that his chest pain is improved.  Denies any headaches or vision changes.  Denies any change in his stool in the ostomy bag.  Meds: Scheduled Meds:   . sodium chloride   Intravenous STAT  . albuterol      . cefTRIAXone (ROCEPHIN)  IV  1 g Intravenous Q24H  . lidocaine      .  morphine injection  4 mg Intravenous Once  . sodium chloride  1,000 mL Intravenous Once   Continuous Infusions:  PRN Meds:.nicotine Allergies: Fentanyl; Hydromorphone hcl; Lorazepam; and Oxycodone-acetaminophen Past Medical  History  Diagnosis Date  . Gout   . Depression   . Diabetes mellitus   . Unspecified essential hypertension   . Hypercholesteremia     IIA  . CAD (coronary artery disease)   . Normocytic anemia     Chronic  . Colitis, ulcerative     NOS  . Personal hx-rectal/anal malignancy   . Urinary incontinence   . Benign prostatic hypertrophy   . H/O ETOH abuse 06/06/2011  . 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 History of left knee surgery   . Pleurisy    Past Surgical History  Procedure Date  . Colon resection   . Transurethral resection of prostate    Family History  Problem Relation Age of Onset  . Dementia Mother   . COPD Father    History   Social History  . Marital Status: Married    Spouse Name: N/A    Number of Children: N/A  . Years of Education: N/A   Occupational History  . Full time    Social History Main Topics  . Smoking status: Former Smoker -- 2.0 packs/day for 15 years    Types: Cigarettes    Quit date: 06/04/1970  . Smokeless tobacco: Current User    Types: Chew  . Alcohol Use: No  . Drug Use: Not on file  . Sexually Active: Not on file   Other Topics Concern  . Not on file   Social History Narrative   Married   Review of Systems: All systems reviewed with the  patient and positive as per history of present illness, otherwise all other systems are negative.  Physical Exam: Blood pressure 107/62, pulse 99, temperature 98.5 F (36.9 C), temperature source Oral, resp. rate 20, SpO2 97.00%. General: Awake, Oriented x3, No acute distress. HEENT: EOMI, Moist mucous membranes Neck: Supple CV: S1 and S2 Lungs: Clear to ascultation bilaterally Abdomen: Obese, Soft, Nontender, Nondistended, +bowel sounds, ostomy bag noted on the right. Ext: Good pulses. Trace edema. No clubbing or cyanosis noted, left knee midline scar noted.  Left knee is enlarged slightly erythematous with joint effusion noted.  Right knee  has minimal erythema, no significant effusion noted after joint tap.  Left ankle erythematous and tender to touch. Neuro: Cranial Nerves II-XII grossly intact. Has 5/5 motor strength in upper and lower extremities.  Lab results:  Basename 06/12/11 1120  NA 130*  K 4.4  CL 94*  CO2 26  GLUCOSE 157*  BUN 41*  CREATININE 1.98*  CALCIUM 9.2  MG --  PHOS --   No results found for this basename: AST:2,ALT:2,ALKPHOS:2,BILITOT:2,PROT:2,ALBUMIN:2 in the last 72 hours No results found for this basename: LIPASE:2,AMYLASE:2 in the last 72 hours  Basename 06/12/11 1120  WBC 17.7*  NEUTROABS 15.6*  HGB 12.1*  HCT 34.7*  MCV 90.4  PLT 157   No results found for this basename: CKTOTAL:3,CKMB:3,CKMBINDEX:3,TROPONINI:3 in the last 72 hours No components found with this basename: POCBNP:3 No results found for this basename: DDIMER in the last 72 hours No results found for this basename: HGBA1C:2 in the last 72 hours No results found for this basename: CHOL:2,HDL:2,LDLCALC:2,TRIG:2,CHOLHDL:2,LDLDIRECT:2 in the last 72 hours No results found for this basename: TSH,T4TOTAL,FREET3,T3FREE,THYROIDAB in the last 72 hours No results found for this basename: VITAMINB12:2,FOLATE:2,FERRITIN:2,TIBC:2,IRON:2,RETICCTPCT:2 in the last 72 hours Imaging results:  Dg Chest 2 View  06/12/2011  *RADIOLOGY REPORT*  Clinical Data:  Shortness of breath, cough and chest pain.  CHEST - 2 VIEW  Comparison: 06/04/2011  Findings: The heart size and mediastinal contours are within normal limits.  Both lungs are clear.  The visualized skeletal structures are unremarkable.  IMPRESSION: No active disease.  Original Report Authenticated By: Reola Calkins, M.D.   Dg Chest 2 View  06/04/2011  *RADIOLOGY REPORT*  Clinical Data: Chest pain, shortness of breath, diabetes.  History of coronary artery disease.  CHEST - 2 VIEW  Comparison: 01/23/2009  Findings: Heart size is accentuated by the AP position of the patient.  Lungs are  clear.  No edema. Visualized osseous structures have a normal appearance.  IMPRESSION: Negative exam.  Original Report Authenticated By: Patterson Hammersmith, M.D.   Other results: EKG: unchanged from previous tracings, normal sinus rhythm, nonspecific ST and T waves changes.  Assessment & Plan by Problem: Left joint effusion/Pyarthrosis Left joint tap had pus with Gram stain showing intracellular and extracellular bacteria.  Orthopedic service has been consult.  Patient empirically started on ceftriaxone and vancomycin.  Change antibiotics depending on urine culture.  Depending on the culture results will likely get ID consultation for further antibiotic input.  Attempted to get records from primary care physician's office regarding patient's recent joint however closed for the weekend.  Left ankle pain X-ray shows fracture of the fibular plate and lowest 2 screws, refracture or nonunion of the bone.  Further management as per orthopedic service.  Bronchitis Continue prednisone taper 10 mg daily for 3 more days then discontinue.  Acute renal failure Likely due to poor by mouth intake.  Will send for a urine analysis.  If renal function does not improve with hydration will consider renal ultrasound at that time.  Already on ceftriaxone.  Leukocytosis Likely due to steroids.  Type 2 diabetes uncontrolled Continue Lantus and sliding scale insulin.  Diabetic diet.  Hemoglobin A1c on 06/06/2011 was 5.7.  Hyponatremia. Likely due to acute renal failure.  Continue to monitor.  Morbid obesity Diet and exercise as outpatient.  History of coronary disease Continue aspirin.  EKG unchanged from EKG from recent admission.  Depression Continue paroxetine.  History of rectal cancer/ulcerative colitis status post colectomy with colostomy Stable  Anemia Likely due to chronic disease from chronic kidney disease.  Hyperlipidemia Continue statin.  Prophylaxis. Lovenox.  CODE STATUS Full  code, discussed with patient and family at the time of admission.  Time spent on admission, talking to the patient, and coordinating care was: 60 mins.  Arnecia Ector A, MD 06/12/2011, 2:54 PM

## 2011-06-12 NOTE — Anesthesia Preprocedure Evaluation (Addendum)
Anesthesia Evaluation  Patient identified by MRN, date of birth, ID band Patient awake    Reviewed: Allergy & Precautions, H&P , NPO status , Patient's Chart, lab work & pertinent test results, reviewed documented beta blocker date and time   Airway Mallampati: II TM Distance: >3 FB Neck ROM: full    Dental No notable dental hx.    Pulmonary neg pulmonary ROS,  breath sounds clear to auscultation  Pulmonary exam normal       Cardiovascular Exercise Tolerance: Good hypertension, Pt. on medications + CAD negative cardio ROS  Rhythm:regular Rate:Normal     Neuro/Psych PSYCHIATRIC DISORDERS Depression negative neurological ROS  negative psych ROS   GI/Hepatic negative GI ROS, Neg liver ROS, PUD,   Endo/Other  negative endocrine ROSDiabetes mellitus-, Poorly Controlled, Type 2, Insulin DependentMorbid obesity  Renal/GU Renal Insufficiency and ARFRenal diseasenegative Renal ROS  negative genitourinary   Musculoskeletal   Abdominal   Peds  Hematology negative hematology ROS (+) Blood dyscrasia, anemia ,   Anesthesia Other Findings   Reproductive/Obstetrics negative OB ROS                         Anesthesia Physical Anesthesia Plan  ASA: III  Anesthesia Plan: General   Post-op Pain Management:    Induction: Intravenous  Airway Management Planned: Oral ETT  Additional Equipment:   Intra-op Plan:   Post-operative Plan: Possible Post-op intubation/ventilation  Informed Consent: I have reviewed the patients History and Physical, chart, labs and discussed the procedure including the risks, benefits and alternatives for the proposed anesthesia with the patient or authorized representative who has indicated his/her understanding and acceptance.   Dental Advisory Given  Plan Discussed with: CRNA and Surgeon  Anesthesia Plan Comments:       Anesthesia Quick Evaluation

## 2011-06-12 NOTE — Progress Notes (Signed)
CXR-shows right mainstem ETT secured at 25cm, withdrew to 19cm.

## 2011-06-12 NOTE — Addendum Note (Signed)
Addendum  created 06/12/11 2108 by Gaetano Hawthorne, MD   Modules edited:Anesthesia Review and Sign Navigator Section, Charting, Inpatient Notes

## 2011-06-12 NOTE — Progress Notes (Signed)
Dr. Christian Mate aware via phone of pt recent blood sugar of 234. No sliding scale available for this evening's CBG. MD stated "Don't worry about it, just send him on down for surgery." OR to recheck and treat as needed.

## 2011-06-12 NOTE — ED Provider Notes (Signed)
History     CSN: [id number]  Arrival date & time 06/12/11  1478   First MD Initiated Contact with Patient 06/12/11 1014      Chief Complaint  Patient presents with  . Leg Swelling  . Leg Pain    (Consider location/radiation/quality/duration/timing/severity/associated sxs/prior treatment) HPI History provided by pt and his daughter.  Pt c/o bilateral knee edema and severe pain (left worse than right) for the past 3 days.  Pain aggravated by flexion.  Has not taken any analgesics.  Has edema of ankles as well. No trauma. No associated fever. His PCP drew off some fluid from left knee yesterday afternoon (sent for culture) which improved sx, but swelling returned last night.  Pt contacted his Orthopedist this am and they have offered to aspirate joint in ED today. Pt attributes his sx to prednisone.  Per prior chart, pt admitted for CP/dyspnea 3/1-3/5 and diagnosed w/ pleurisy and bronchitis.  Discharged home on steroid taper which he has been compliant with.  Pulmonary sx are mildly improved but his daughter is concerned that the steroids have been ineffective and he should be much better by now.     Past Medical History  Diagnosis Date  . Gout   . Depression   . Diabetes mellitus   . Unspecified essential hypertension   . Hypercholesteremia     IIA  . CAD (coronary artery disease)   . Normocytic anemia     Chronic  . Colitis, ulcerative     NOS  . Personal hx-rectal/anal malignancy   . Urinary incontinence   . Benign prostatic hypertrophy   . H/O ETOH abuse 06/06/2011  . 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 History of left knee surgery   . Pleurisy     Past Surgical History  Procedure Date  . Colon resection   . Transurethral resection of prostate     History reviewed. No pertinent family history.  History  Substance Use Topics  . Smoking status: Former Smoker -- 2.0 packs/day for 15 years    Types: Cigarettes    Quit date:  06/04/1970  . Smokeless tobacco: Never Used  . Alcohol Use: No      Review of Systems  All other systems reviewed and are negative.    Allergies  Hydromorphone hcl; Lorazepam; and Oxycodone-acetaminophen  Home Medications   Current Outpatient Rx  Name Route Sig Dispense Refill  . ASPIRIN 81 MG PO TABS Oral Take 81 mg by mouth daily.      Marland Kitchen BENZONATATE 100 MG PO CAPS Oral Take 1 capsule (100 mg total) by mouth 3 (three) times daily as needed for cough. 30 capsule 1  . CINNAMON 500 MG PO CAPS Oral Take 500 mg by mouth daily.    Marland Kitchen EZETIMIBE-SIMVASTATIN 10-40 MG PO TABS Oral Take 1 tablet by mouth at bedtime.    Marland Kitchen FISH OIL OIL Oral Take 1 tablet by mouth daily.      . INSULIN ASPART 100 UNIT/ML [place] SOLN Subcutaneous Inject 14-16 Units into the skin See admin instructions. Breakfast 16 units; Lunch 20 units;  Dinner 14 units    . INSULIN GLARGINE 100 UNIT/ML [place] SOLN Subcutaneous Inject 70 Units into the skin at bedtime. 10 mL   . VICTOZA Carrier Subcutaneous Inject 1.2 mg into the skin daily.    Marland Kitchen LOSARTAN POTASSIUM 100 MG PO TABS Oral Take 100 mg by mouth daily.    Marland Kitchen ONE-DAILY MULTI VITAMINS PO TABS  Oral Take 1 tablet by mouth daily.      Marland Kitchen NAPROXEN SODIUM 220 MG PO TABS Oral Take 220 mg by mouth 2 (two) times daily with a meal.    . PAROXETINE HCL 20 MG PO TABS Oral Take 20 mg by mouth daily.      Marland Kitchen PREDNISONE (PAK) 10 MG PO TABS  Take 6 tabs 06/09/11, decrease by 10 mg daily until off. 21 tablet 0    BP 107/62  Pulse 99  Temp(Src) 98.5 F (36.9 C) (Oral)  Resp 20  SpO2 97%  Physical Exam  Nursing note and vitals reviewed. Constitutional: He is oriented to person, place, and time. He appears well-developed and well-nourished. No distress.       Morbidly obese  HENT:  Head: Normocephalic and atraumatic.  Eyes:       Normal appearance  Neck: Normal range of motion.  Cardiovascular: Normal rate and regular rhythm.   Pulmonary/Chest: Effort normal and breath sounds normal.        Diffuse inspiratory rhonchi and expiratory wheezing  Abdominal: Soft. Bowel sounds are normal. He exhibits no distension. There is no tenderness.  Musculoskeletal:       Vertical surgical scar left anterior knee. No erythema or warmth of skin. Edema bilateral knees, left worse than right.  Both knees diffusely ttp, particularly at medial/lateral joint line.  Pain w/ minimal flexion of left knee.  He able to actively flex right knee.  Mild edema and tenderness at left medial malleolus.  Full ROM of both ankles w/out pain.  No calf edema/tenderness.  2+ DP pulses and distal sensation intact.    Neurological: He is alert and oriented to person, place, and time.  Skin: Skin is warm and dry. No rash noted.  Psychiatric: He has a normal mood and affect. His behavior is normal.    ED Course  Apiration of blood/fluid Date/Time: 06/12/2011 11:17 PM Performed by: Otilio Miu Authorized by: Ruby Cola E Consent: Verbal consent obtained. Risks and benefits: risks, benefits and alternatives were discussed Consent given by: patient Patient understanding: patient states understanding of the procedure being performed Preparation: Patient was prepped and draped in the usual sterile fashion. Local anesthesia used: yes Anesthesia: local infiltration Local anesthetic: lidocaine 2% without epinephrine Anesthetic total: 10 ml Patient sedated: no Patient tolerance: Patient tolerated the procedure well with no immediate complications. Comments: Left knee, medial to patella, 95cc bloody, purulent fluids aspirated  Apiration of blood/fluid Date/Time: 06/12/2011 11:20 PM Performed by: Otilio Miu Authorized by: Ruby Cola E Consent: Verbal consent obtained. Risks and benefits: risks, benefits and alternatives were discussed Consent given by: patient Patient understanding: patient states understanding of the procedure being performed Preparation: Patient was prepped  and draped in the usual sterile fashion. Local anesthesia used: yes Anesthesia: local infiltration Local anesthetic: lidocaine 2% without epinephrine Anesthetic total: 10 ml Patient sedated: no Patient tolerance: Patient tolerated the procedure well with no immediate complications. Comments: Right knee, medial to patella, 20cc nml-appearing synovial fluid aspirated   (including critical care time)   Date: 06/12/2011  Rate: 100  Rhythm: sinus tachycardia w/ ventricular trigeminy  QRS Axis: normal  Intervals: normal  ST/T Wave abnormalities: nonspecific T wave changes  Conduction Disutrbances:none  Narrative Interpretation:   Old EKG Reviewed: changes noted (trigeminy)   Labs Reviewed - No data to display Dg Chest 2 View  06/12/2011  *RADIOLOGY REPORT*  Clinical Data:  Shortness of breath, cough and chest pain.  CHEST - 2  VIEW  Comparison: 06/04/2011  Findings: The heart size and mediastinal contours are within normal limits.  Both lungs are clear.  The visualized skeletal structures are unremarkable.  IMPRESSION: No active disease.  Original Report Authenticated By: Reola Calkins, M.D.   Dg Ankle Complete Left  06/12/2011  *RADIOLOGY REPORT*  Clinical Data: Pain and swelling.  Previous surgery.  LEFT ANKLE COMPLETE - 3+ VIEW  Comparison: 08/17/2003  Findings: There has been fracture of the fibular plate to.  There are also fractures of the lowest two screws.  There is refracture or nonunion.  There is degenerative disease of the ankle joint with osteophytes, joint effusion and small intra-articular loose bodies.  Talar dome appears smooth.  IMPRESSION: Fracture of the fibular plate and lowest two screws.  Refracture or nonunion of the bone.  Degenerative disease of the ankle joint with osteophyte formation, joint effusion and probably small intra-articular loose bodies.  Original Report Authenticated By: Thomasenia Sales, M.D.   Dg Chest Port 1 View  06/12/2011  *RADIOLOGY REPORT*   Clinical Data: Intubated.  PORTABLE CHEST - 1 VIEW  Comparison: 06/12/2011.  Findings: Interval endotracheal tube with its tip in the right mainstem bronchus.  Interval complete opacification of the left hemithorax.  Clear right lung.  Mediastinal shift to the left. Unremarkable bones.  IMPRESSION: Endotracheal tube tip in the right mainstem bronchus with associated complete collapse of the left lung.  It is recommended that this be retracted 7 cm.  These results were called by telephone on 06/12/2011  at  2215 hours to  [facility], the patient's nurse, who verbally acknowledged these results.  Original Report Authenticated By: Darrol Angel, M.D.   Dg Knee Complete 4 Views Left  06/12/2011  *RADIOLOGY REPORT*  Clinical Data: Pain and swelling.  Fluid aspiration.  LEFT KNEE - COMPLETE 4+ VIEW  Comparison: None.  Findings: There is a joint effusion.  A few dots of a air are notable in the suprapatellar region, presumably related to the recent aspiration.  Components of the total knee replacement appear unremarkable.  IMPRESSION: Previous total knee replacement.  Joint effusion.  A few dots of a air in the suprapatellar region presumably related to the recent aspiration.  Original Report Authenticated By: Thomasenia Sales, M.D.   Dg Knee Complete 4 Views Right  06/12/2011  *RADIOLOGY REPORT*  Clinical Data: Pain.  Fluid aspiration.  RIGHT KNEE - COMPLETE 4+ VIEW  Comparison: None.  Findings: There is advanced tricompartmental osteoarthritis with joint space narrowing and osteophyte formation.  There is a small amount of joint fluid.  No visible intra-articular loose body.  No fracture or other focal lesion.  Arterial calcification is noted.  IMPRESSION: Advanced tricompartmental osteoarthritis.  Small joint effusion.  Original Report Authenticated By: Thomasenia Sales, M.D.  h   1. Joint pain, knee   2. Renal insufficiency       MDM  Pt presents w/ non-traumatic, bilateral knee edema/pain.  Had left knee  aspiration yesterday but sx recurred approx 3 hours later.  No associated fever.  No signs of DVT on exam.  Labs sig for elevated sed rate, leukocytosis, mild hyponatremia and renal insufficiency.   Both knees aspirated in ED; 95cc purulent fluids removed from left and 20cc normal appearing synovial fluid from right.  Cultures pending.  Pt receiving IV morphine for pain.  [facility] has been consulted for admission.          Otilio Miu, [place] 06/12/11 [phone]

## 2011-06-13 ENCOUNTER — Inpatient Hospital Stay (HOSPITAL_COMMUNITY): Payer: Medicare Other

## 2011-06-13 DIAGNOSIS — J9600 Acute respiratory failure, unspecified whether with hypoxia or hypercapnia: Secondary | ICD-10-CM

## 2011-06-13 DIAGNOSIS — N179 Acute kidney failure, unspecified: Secondary | ICD-10-CM

## 2011-06-13 DIAGNOSIS — A419 Sepsis, unspecified organism: Secondary | ICD-10-CM

## 2011-06-13 LAB — URINALYSIS, ROUTINE W REFLEX MICROSCOPIC
Glucose, UA: NEGATIVE mg/dL
Specific Gravity, Urine: 1.015 (ref 1.005–1.030)
Urobilinogen, UA: 0.2 mg/dL (ref 0.0–1.0)

## 2011-06-13 LAB — GLUCOSE, CAPILLARY
Glucose-Capillary: 162 mg/dL — ABNORMAL HIGH (ref 70–99)
Glucose-Capillary: 183 mg/dL — ABNORMAL HIGH (ref 70–99)
Glucose-Capillary: 186 mg/dL — ABNORMAL HIGH (ref 70–99)
Glucose-Capillary: 290 mg/dL — ABNORMAL HIGH (ref 70–99)

## 2011-06-13 LAB — CBC
HCT: 30.1 % — ABNORMAL LOW (ref 39.0–52.0)
Hemoglobin: 10.5 g/dL — ABNORMAL LOW (ref 13.0–17.0)
RDW: 13.6 % (ref 11.5–15.5)
WBC: 17.9 10*3/uL — ABNORMAL HIGH (ref 4.0–10.5)

## 2011-06-13 LAB — URINE MICROSCOPIC-ADD ON

## 2011-06-13 LAB — BASIC METABOLIC PANEL
Chloride: 97 mEq/L (ref 96–112)
GFR calc Af Amer: 25 mL/min — ABNORMAL LOW (ref >90)
Potassium: 5 mEq/L (ref 3.5–5.1)

## 2011-06-13 MED ORDER — DILTIAZEM LOAD VIA INFUSION
10.0000 mg | Freq: Once | INTRAVENOUS | Status: AC
  Administered 2011-06-13: 10 mg via INTRAVENOUS
  Filled 2011-06-13: qty 10

## 2011-06-13 MED ORDER — DILTIAZEM HCL 100 MG IV SOLR
5.0000 mg/h | INTRAVENOUS | Status: AC
  Administered 2011-06-13 – 2011-06-14 (×2): 5 mg/h via INTRAVENOUS
  Administered 2011-06-14: 15 mg/h via INTRAVENOUS
  Administered 2011-06-15: 10 mg/h via INTRAVENOUS

## 2011-06-13 MED ORDER — IPRATROPIUM-ALBUTEROL 18-103 MCG/ACT IN AERO: 2.0000 | INHALATION_SPRAY | Freq: Four times a day (QID) | RESPIRATORY_TRACT | Status: DC

## 2011-06-13 MED ORDER — ALBUTEROL SULFATE HFA 108 (90 BASE) MCG/ACT IN AERS
2.0000 | INHALATION_SPRAY | RESPIRATORY_TRACT | Status: DC | PRN
  Administered 2011-06-13 – 2011-06-16 (×2): 2 via RESPIRATORY_TRACT

## 2011-06-13 MED ORDER — VANCOMYCIN HCL 1000 MG IV SOLR
1750.0000 mg | INTRAVENOUS | Status: DC
  Filled 2011-06-13: qty 1750

## 2011-06-13 NOTE — Consult Note (Signed)
Name: [name] MRN: [id number] DOB: Dec 15, 1940    LOS: 1  PCCM ADMISSION NOTE  History of Present Illness: pt is a 71 y/o M with extensive PMHx including CAD, DM, alcohol abuse, DL, Hx of colon ca s/p colectomy and ileostomy, who was recently admitted due to acute bronchitis treated with antibiotics and steroid taper. Pt was able to go home and developed swelling and pain in the L knee. Pt was seen at the ortho urgent care and L knee was tapped with elevated WBC count. Pt was admitted to [facility] and underwent I/D on the L knee by orthopedics today and remained intubated after the procedure due to low O2 sats. CXR post procedure showed selective intubation of the R mainstem bronchus and the tube was pulled out a few cm and the L lung reexpanded. PCCM service was consulted for further vent management due to acute resp failure.  Lines / Drains: Peripheral access  Cultures: L knee Blood cultures  Antibiotics: Vancomycin 3/9 >> Ceftriaxone 3/9 >>     Vital Signs: Temp:  [98.3 F (36.8 C)-99 F (37.2 C)] 99 F (37.2 C) (03/10 0800) Pulse Rate:  [81-105] 89  (03/10 0730) Resp:  [12-27] 18  (03/10 0730) BP: (80-164)/(46-74) 94/52 mmHg (03/10 0730) SpO2:  [74 %-100 %] 100 % (03/10 0746) FiO2 (%):  [40 %-100 %] 40 % (03/10 0746) Weight:  [121.1 kg (266 lb 15.6 oz)-123.8 kg (272 lb 14.9 oz)] 123.8 kg (272 lb 14.9 oz) (03/09 2200) I/O last 3 completed shifts: In: 2266.7 [I.V.:2256.7; IV Piggyback:10] Out: 1493 [Urine:1128; Drains:115; Stool:100; Blood:150]  Physical Examination: General:  Intubated, mechanically ventilated, no acute distress Neuro:  Awake, following commands, synchronous, nonfocal HEENT:  PERRL, pink conjunctivae, moist membranes Neck:  Supple, no JVD   Cardiovascular:  RRR, no M/R/G Lungs:  Bilateral breath sounds with no wheezes, rhonchi or crackles. Abdomen:  Soft, nontender, nondistended, bowel sounds present Musculoskeletal:  Moves all extremities, no pedal  edema Skin:  No rash  Ventilator settings: Vent Mode:  [-] CPAP FiO2 (%):  [40 %-100 %] 40 % Set Rate:  [16 bmp] 16 bmp Vt Set:  [550 mL-640 mL] 550 mL PEEP:  [5 cmH20] 5 cmH20 Pressure Support:  [5 cmH20] 5 cmH20 Plateau Pressure:  [16 cmH20-26 cmH20] 16 cmH20  Labs and Imaging:  Reviewed.  Please refer to the Assessment and Plan section for relevant results.  ASSESSMENT AND PLAN  NEUROLOGIC Sedated for vent support. P: - WUA tolerated this am 3/10  PULMONARY  Lab 06/12/11 2346  PHART 7.336*  PCO2ART 43.2  PO2ART 317.0*  HCO3 22.4  O2SAT 99.9   A:  Acute resp failure CXR after surgical procedure with selective intubation Repeat CXR with reexpansion of L lung with residual infiltrates P: --> plan extubate this am -->  respiratory cultures pending -->  On vanc and Ceftriaxone to cover knee  CARDIOVASCULAR P: -->  NS boluses to goal CVP>12 -->  Trend sepsis markers -->  Hemodynamically stable  RENAL  Lab 06/13/11 0340 06/12/11 1120 06/08/11 0425 06/07/11 0420  NA 129* 130* 136 133*  K 5.0 4.4 -- --  CL 97 94* 99 97  CO2 24 26 27 26   BUN 45* 41* 38* 37*  CREATININE 2.73* 1.98* 1.17 1.11  CALCIUM 8.2* 9.2 9.7 9.8  MG -- -- -- --  PHOS -- -- -- --   A:  Acute on CKD P: Likely due to DM, HTN and intravasc vol depletion, worsening 3/10 Pt has  also been having low oral intake due to septic arthritis -->  BMP am --> push volume resucitation for goal CVP > 12  GASTROINTESTINAL No results found for this basename: AST:5,ALT:5,ALKPHOS:5,BILITOT:5,PROT:5,ALBUMIN:5 in the last 168 hours A:  Hx of colon ca s/p ileostomy P: Ileostomy with normal output.  INFECTIOUS  Lab 06/13/11 0340 06/12/11 1120 06/08/11 0425 06/07/11 0420  WBC 17.9* 17.7* 12.8* 11.9*  PROCALCITON -- -- -- --   A:  Septic arthritis s/p I/D - Hx of recent acute bronchitis P: -->  L knee/R knee cultures pending -->  Resp cultures and blood cultures pending -->  On vanc and  Ceftriaxone  ENDOCRINE  Lab 06/13/11 0640 06/13/11 0418 06/13/11 0040 06/12/11 2059 06/12/11 1723  GLUCAP 162* 183* 186* 200* 243*   A:  DM P: -->  ICU Hyperglycemia protocol, transition to standard protocol when taking PO  BEST PRACTICE / DISPOSITION -->  ICU status under PCCM -->  Full code -->  NPO -->  Enoxaparin, adjusted per kidney function -->  Protonix IV for GI Px -->  Ventilator bundle  The patient is critically ill with multiple organ systems failure and requires high complexity decision making for assessment and support, frequent evaluation and titration of therapies, application of advanced monitoring technologies and extensive interpretation of multiple databases. Critical Care Time devoted to patient care services described in this note is 30 minutes.   Levy Pupa, MD, PhD 06/13/2011, 9:48 AM [facility] [phone] or if no answer [phone]

## 2011-06-13 NOTE — ED Provider Notes (Signed)
Medical screening examination/treatment/procedure(s) were conducted as a shared visit with non-physician practitioner(s) and myself.  I personally evaluated the patient during the encounter Pt c/o left knee pain and swelling. Hx tka. Had arthrocentesis yesterday, fluid and pain have re-accumulated. Pain w any passive rom left knee, sl warm, no erythema overlying knee. Will tap, ortho consult re possible septic joint, admit.   Suzi Roots, MD 06/13/11 [phone]

## 2011-06-13 NOTE — Plan of Care (Signed)
Problem: Phase III Progression Outcomes Goal: Activity advanced as tolerated Outcome: Not Progressing Will work with PT/OT when MD ready

## 2011-06-13 NOTE — Significant Event (Signed)
Pt with A fib and RVR.  BP okay.  Will start cardizem gtt.  Coralyn Helling, MD 06/13/2011, 11:30 PM [phone]

## 2011-06-13 NOTE — Progress Notes (Signed)
ANTIBIOTIC CONSULT NOTE - FOLLOW UP  Pharmacy Consult for Vancomycin Indication: Septic arthritis  Allergies  Allergen Reactions  . Fentanyl   . Hydromorphone Hcl   . Lorazepam   . Oxycodone-Acetaminophen     Patient Measurements: Height: 6' (182.9 cm) Weight: 272 lb 14.9 oz (123.8 kg) IBW/kg (Calculated) : 77.6  Adjusted Body Weight: 96.6kg  Vital Signs: Temp: 98.8 F (37.1 C) (03/10 1200) Temp src: Oral (03/10 1200) BP: 128/61 mmHg (03/10 0900) Pulse Rate: 94  (03/10 1000) Intake/Output from previous day: 03/09 0701 - 03/10 0700 In: 2266.7 [I.V.:2256.7; IV Piggyback:10] Out: 1493 [Urine:1128; Drains:115; Stool:100; Blood:150] Intake/Output from this shift: Total I/O In: 373 [I.V.:363; IV Piggyback:10] Out: 262 [Urine:62; Stool:200]  Labs:  [facility] 06/13/11 0340 06/12/11 1120  WBC 17.9* 17.7*  HGB 10.5* 12.1*  PLT 135* 157  LABCREA -- --  CREATININE 2.73* 1.98*   Estimated Creatinine Clearance: 33.2 ml/min (by C-G formula based on Cr of 2.73).   24 ml/min (normalized)  Microbiology: 3/9 L knee joint: abundant GPC pairs 3/9 R knee joint: no org seen 3/9 Blood x2: ngtd  Anti-infectives     Start     Dose/Rate Route Frequency Ordered Stop   06/13/11 1600   vancomycin (VANCOCIN) 1,500 mg in sodium chloride 0.9 % 500 mL IVPB        1,500 mg 250 mL/hr over 120 Minutes Intravenous Every 24 hours 06/12/11 1512     06/12/11 1932   tobramycin (NEBCIN) powder  Status:  Discontinued          As needed 06/12/11 1933 06/12/11 2047   06/12/11 1928   vancomycin (VANCOCIN) powder  Status:  Discontinued          As needed 06/12/11 1928 06/12/11 2047   06/12/11 1600   vancomycin (VANCOCIN) 2,500 mg in sodium chloride 0.9 % 500 mL IVPB        2,500 mg 250 mL/hr over 120 Minutes Intravenous  Once 06/12/11 1511 06/12/11 1849   06/12/11 1500   cefTRIAXone (ROCEPHIN) 1 g in dextrose 5 % 50 mL IVPB        1 g 100 mL/hr over 30 Minutes Intravenous Every 24 hours 06/12/11  1445            Assessment: Day #2 Vancomycin/Rocephin for septic arthritis SCr rising, requiring dosage adjustment  Goal of Therapy:  Vancomycin trough level 15-20 mcg/ml  Plan:   Change vancomycin to 1750mg  IV q48h  Follow renal function closely, levels as needed  Follow up cultures  Loralee Pacas, PharmD, BCPS Pager: [phone] 06/13/2011,12:55 PM

## 2011-06-13 NOTE — Op Note (Signed)
[name], [name] NO.:  [id number]  MEDICAL RECORD NO.:  [id number]  LOCATION:  1227                         FACILITY:  [facility]  PHYSICIAN:  Dyke Brackett, M.D.    DATE OF BIRTH:  1940/05/14  DATE OF PROCEDURE:  06/12/2011 DATE OF DISCHARGE:                              OPERATIVE REPORT   INDICATIONS:  This is a 71 year old, who presented with an infected total knee with the relatively short duration of symptoms at least in terms of an infection over the past week.  Aspirate showed intracellular bacteria, thought to be a candidate for irrigation, debridement, incision and drainage as well as poly exchange.  PREOPERATIVE DIAGNOSIS:  Pyarthrosis with infected left total knee.  POSTOPERATIVE DIAGNOSIS:  Pyarthrosis with infected left total knee.  OPERATION: 1. Incision, drainage, irrigation, and debridement left total knee. 2. Poly exchange.  SURGEON:  Dyke Brackett, M.D.  ASSISTANT:  Margart Sickles, PA-C.  TOURNIQUET TIME:  Approximately 45 minutes.  DESCRIPTION OF THE PROCEDURE:  He was elevated.  The tourniquet was inflated to 375.  He had a straight skin incision on the old skin incision, medial parapatellar approach to the knee was made.  All foreign body suture material was removed.  There was extensive synovitis reaction and infection, purulent material.  We did send cultures. Aggressive debridement with sharp dissection and extensive synovectomy was carried out.  There was some mild softening of the bone, but no gross loosening.  The infection actually had the appearance of mild amount of softening that had been present longer than the week, but without detailed knowledge.  In fact, the x-rays did show no gross loosening.  We elected to proceed with I and D, which we did with about 6,000 to 8,000 cc of pulsatile lavage on completion of subtotal synovectomy.  We, then the translocated the poly.  We did a poly exchange with a 17.5-mm large poly,  replaced the poly.  Tourniquet was released.  Small bleeders were coagulated.  We had absorbable calcium phosphate.  We, also at end of the procedure, we placed absorbable calcium sulfate antibiotic impregnated beads into the knee.  We did 2 packs of 10 cc each with a gram of vancomycin and tobramycin for a local antibiotic delivery system.  We placed these after they were harden into the wound.  Hemovac drain was placed extending superolaterally.  The wound was closed with #1 PDS on the capsule, 0 and 2-0 Vicryl, and skin clips.  Marcaine with epinephrine __________ the wound, lightly pressure sterile dressing applied, taken to recovery room stable in condition.     Dyke Brackett, M.D.     WDC/MEDQ  D:  06/12/2011  T:  06/13/2011  Job:  [id number]

## 2011-06-13 NOTE — Progress Notes (Signed)
Extubated Pt to 2L nasal cannula after weaning on cpap and pressure support 5/5 for around 2 hours.

## 2011-06-13 NOTE — Progress Notes (Signed)
Patient remained intubated post-op. Critical care managing patient's vent. Critical care to assume primary while on vent. Once off vent and if critical care service would like [facility] hospitalist service to assume primary please do not hesitate to call [phone].  Parris Cudworth A, MD 06/13/2011, 7:38 AM

## 2011-06-13 NOTE — Progress Notes (Signed)
Subjective: 1 Day Post-Op Procedure(s) (LRB): IRRIGATION AND DEBRIDEMENT KNEE WITH POLY EXCHANGE (Left) Patient reports pain as moderate.    Objective: Vital signs in last 24 hours: Temp:  [98.3 F (36.8 C)-99 F (37.2 C)] 98.8 F (37.1 C) (03/10 1200) Pulse Rate:  [81-105] 105  (03/10 1300) Resp:  [12-27] 21  (03/10 1300) BP: (80-164)/(46-74) 131/49 mmHg (03/10 1200) SpO2:  [74 %-100 %] 100 % (03/10 1300) FiO2 (%):  [40 %-100 %] 40 % (03/10 0746) Weight:  [121.1 kg (266 lb 15.6 oz)-123.8 kg (272 lb 14.9 oz)] 123.8 kg (272 lb 14.9 oz) (03/09 2200)  Intake/Output from previous day: 03/09 0701 - 03/10 0700 In: 2266.7 [I.V.:2256.7; IV Piggyback:10] Out: 1493 [Urine:1128; Drains:115; Stool:100; Blood:150] Intake/Output this shift: Total I/O In: 1633 [P.O.:960; I.V.:663; IV Piggyback:10] Out: 307 [Urine:107; Stool:200]   Basename 06/13/11 0340 06/12/11 1120  HGB 10.5* 12.1*    Basename 06/13/11 0340 06/12/11 1120  WBC 17.9* 17.7*  RBC 3.28* 3.84*  HCT 30.1* 34.7*  PLT 135* 157    Basename 06/13/11 0340 06/12/11 1120  NA 129* 130*  K 5.0 4.4  CL 97 94*  CO2 24 26  BUN 45* 41*  CREATININE 2.73* 1.98*  GLUCOSE 215* 157*  CALCIUM 8.2* 9.2    Basename 06/12/11 1645  LABPT --  INR 1.19    Intact pulses distally  Assessment/Plan: 1 Day Post-Op Procedure(s) (LRB): IRRIGATION AND DEBRIDEMENT KNEE WITH POLY EXCHANGE (Left) Continue ABX therapy due to Post-op infection Per medical team.will discuss with Dr. Cleophas Dunker Zabella Wease JR,W D 06/13/2011, 1:13 PM

## 2011-06-14 ENCOUNTER — Inpatient Hospital Stay (HOSPITAL_COMMUNITY): Payer: Medicare Other

## 2011-06-14 DIAGNOSIS — T8450XA Infection and inflammatory reaction due to unspecified internal joint prosthesis, initial encounter: Secondary | ICD-10-CM

## 2011-06-14 DIAGNOSIS — Y831 Surgical operation with implant of artificial internal device as the cause of abnormal reaction of the patient, or of later complication, without mention of misadventure at the time of the procedure: Secondary | ICD-10-CM

## 2011-06-14 DIAGNOSIS — R7881 Bacteremia: Secondary | ICD-10-CM

## 2011-06-14 DIAGNOSIS — N179 Acute kidney failure, unspecified: Secondary | ICD-10-CM

## 2011-06-14 DIAGNOSIS — A419 Sepsis, unspecified organism: Secondary | ICD-10-CM

## 2011-06-14 DIAGNOSIS — A4101 Sepsis due to Methicillin susceptible Staphylococcus aureus: Secondary | ICD-10-CM

## 2011-06-14 LAB — URINE CULTURE: Culture: NO GROWTH

## 2011-06-14 LAB — GLUCOSE, CAPILLARY
Glucose-Capillary: 148 mg/dL — ABNORMAL HIGH (ref 70–99)
Glucose-Capillary: 184 mg/dL — ABNORMAL HIGH (ref 70–99)
Glucose-Capillary: 190 mg/dL — ABNORMAL HIGH (ref 70–99)
Glucose-Capillary: 279 mg/dL — ABNORMAL HIGH (ref 70–99)

## 2011-06-14 LAB — BASIC METABOLIC PANEL
CO2: 20 mEq/L (ref 19–32)
Calcium: 8.3 mg/dL — ABNORMAL LOW (ref 8.4–10.5)
GFR calc non Af Amer: 12 mL/min — ABNORMAL LOW (ref >90)
Glucose, Bld: 192 mg/dL — ABNORMAL HIGH (ref 70–99)
Potassium: 4.6 mEq/L (ref 3.5–5.1)
Sodium: 128 mEq/L — ABNORMAL LOW (ref 135–145)

## 2011-06-14 LAB — CBC
Hemoglobin: 9.7 g/dL — ABNORMAL LOW (ref 13.0–17.0)
MCH: 31.3 pg (ref 26.0–34.0)
Platelets: 125 10*3/uL — ABNORMAL LOW (ref 150–400)
RBC: 3.1 MIL/uL — ABNORMAL LOW (ref 4.22–5.81)
WBC: 15 10*3/uL — ABNORMAL HIGH (ref 4.0–10.5)

## 2011-06-14 LAB — VANCOMYCIN, RANDOM: Vancomycin Rm: 16.8 ug/mL

## 2011-06-14 MED ORDER — INSULIN ASPART 100 UNIT/ML ~~LOC~~ SOLN
0.0000 [IU] | Freq: Three times a day (TID) | SUBCUTANEOUS | Status: DC
  Administered 2011-06-15: 11 [IU] via SUBCUTANEOUS
  Administered 2011-06-15: 7 [IU] via SUBCUTANEOUS
  Administered 2011-06-15 – 2011-06-16 (×2): 4 [IU] via SUBCUTANEOUS
  Administered 2011-06-16 – 2011-06-17 (×5): 7 [IU] via SUBCUTANEOUS
  Administered 2011-06-18: 3 [IU] via SUBCUTANEOUS
  Administered 2011-06-19: 2 [IU] via SUBCUTANEOUS
  Administered 2011-06-19: 4 [IU] via SUBCUTANEOUS
  Administered 2011-06-20: 3 [IU] via SUBCUTANEOUS
  Administered 2011-06-22: 4 [IU] via SUBCUTANEOUS
  Administered 2011-06-22: 11 [IU] via SUBCUTANEOUS
  Administered 2011-06-23: 3 [IU] via SUBCUTANEOUS
  Administered 2011-06-23: 7 [IU] via SUBCUTANEOUS
  Administered 2011-06-23: 3 [IU] via SUBCUTANEOUS
  Administered 2011-06-24: 4 [IU] via SUBCUTANEOUS
  Administered 2011-06-24: 3 [IU] via SUBCUTANEOUS

## 2011-06-14 MED ORDER — INSULIN ASPART 100 UNIT/ML ~~LOC~~ SOLN
0.0000 [IU] | Freq: Every day | SUBCUTANEOUS | Status: DC
  Administered 2011-06-14: 3 [IU] via SUBCUTANEOUS
  Administered 2011-06-15: 2 [IU] via SUBCUTANEOUS

## 2011-06-14 MED ORDER — RIFAMPIN 300 MG PO CAPS
600.0000 mg | ORAL_CAPSULE | Freq: Every day | ORAL | Status: DC
  Administered 2011-06-14 – 2011-06-15 (×2): 600 mg via ORAL
  Filled 2011-06-14 (×4): qty 2

## 2011-06-14 MED ORDER — INSULIN ASPART 100 UNIT/ML ~~LOC~~ SOLN: 0.0000 [IU] | Freq: Three times a day (TID) | SUBCUTANEOUS | Status: DC

## 2011-06-14 MED ORDER — VANCOMYCIN HCL 1000 MG IV SOLR
1750.0000 mg | Freq: Once | INTRAVENOUS | Status: AC
  Administered 2011-06-14: 1750 mg via INTRAVENOUS
  Filled 2011-06-14: qty 1750

## 2011-06-14 MED ORDER — INSULIN ASPART 100 UNIT/ML ~~LOC~~ SOLN: 0.0000 [IU] | SUBCUTANEOUS | Status: DC

## 2011-06-14 NOTE — Progress Notes (Signed)
eLink Physician-Brief Progress Note Patient Name: RANDI COLLEGE DOB: [birth year] MRN: [id number]  Date of Service  06/14/2011   HPI/Events of Note   Multiple protocols active for CBGs , now trending higher, pt on diet  eICU Interventions  WIll change to SSI order set - resistant scale (on steroids) & persists with insulin with each meal & lantus   Intervention Category Intermediate Interventions: Hyperglycemia - evaluation and treatment  Ferd Horrigan V. 06/14/2011, 6:37 PM

## 2011-06-14 NOTE — Progress Notes (Signed)
Name: [name] MRN: [id number] DOB: 04-16-1940    LOS: 2  PCCM PROGRESS NOTE  Brief patient profile: 70 yowm with extensive PMHx including CAD, DM, alcohol abuse, DL, Hx of colon ca s/p colectomy and ileostomy, who was recently admitted due to acute bronchitis treated with antibiotics and steroid taper. Pt was able to go home and developed swelling and pain in the L knee. Pt was seen at the ortho urgent care and L knee was tapped with elevated WBC count. Pt was admitted to [facility] and underwent I/D on the L knee by orthopedics 3/10 and remained intubated after the procedure due to low O2 sats. CXR post procedure showed selective intubation of the R mainstem bronchus and the tube was pulled out a few cm and the L lung reexpanded. PCCM service was consulted for further vent management due to acute resp failure.  Lines / Drains:  3/10 ott>>3/10  Cultures: L knee 3/9 >>SA>> Blood cultures 3/9 >>SAx2>> MRSA 3/9 > neg   Antibiotics: Vancomycin 3/9 >> 3/11 (acute renal failure) Ceftriaxone 3/9 >>     Vital Signs: BP 83/61  Pulse 83  Temp(Src) 99.1 F (37.3 C) (Axillary)  Resp 19  Ht 6' (1.829 m)  Wt 278 lb (126.1 kg)  BMI 37.70 kg/m2  SpO2 95%    Intake/Output Summary (Last 24 hours) at 06/14/11 1308 Last data filed at 06/14/11 1306  Gross per 24 hour  Intake   2945 ml  Output   1865 ml  Net   1080 ml        Physical Examination: General:  NAD , not intubated. Neuro:  Awake, following commands, synchronous, nonfocal HEENT:  PERRL, pink conjunctivae, moist membranes Neck:  Supple, no JVD   Cardiovascular:  RRR, no M/R/G Lungs:  Bilateral breath sounds with no wheezes, rhonchi or crackles. Abdomen:  Soft, nontender, nondistended, bowel sounds present, colostomy with gas Musculoskeletal:  Moves all extremities, no pedal edema. Lt knee immobilizer.  Skin:  No rash      Labs and Imaging:    Lab 06/14/11 0340 06/13/11 0340 06/12/11 1120  NA 128* 129* 130*  K 4.6 5.0  4.4  CL 96 97 94*  CO2 20 24 26   BUN 59* 45* 41*  CREATININE 4.32* 2.73* 1.98*  GLUCOSE 192* 215* 157*    Lab 06/14/11 0340 06/13/11 0340 06/12/11 1120  HGB 9.7* 10.5* 12.1*  HCT 28.2* 30.1* 34.7*  WBC 15.0* 17.9* 17.7*  PLT 125* 135* 157   Dg Chest 2 View  06/12/2011  *RADIOLOGY REPORT*  Clinical Data:  Shortness of breath, cough and chest pain.  CHEST - 2 VIEW  Comparison: 06/04/2011  Findings: The heart size and mediastinal contours are within normal limits.  Both lungs are clear.  The visualized skeletal structures are unremarkable.  IMPRESSION: No active disease.  Original Report Authenticated By: Reola Calkins, M.D.   Dg Ankle Complete Left  06/12/2011  *RADIOLOGY REPORT*  Clinical Data: Pain and swelling.  Previous surgery.  LEFT ANKLE COMPLETE - 3+ VIEW  Comparison: 08/17/2003  Findings: There has been fracture of the fibular plate to.  There are also fractures of the lowest two screws.  There is refracture or nonunion.  There is degenerative disease of the ankle joint with osteophytes, joint effusion and small intra-articular loose bodies.  Talar dome appears smooth.  IMPRESSION: Fracture of the fibular plate and lowest two screws.  Refracture or nonunion of the bone.  Degenerative disease of the ankle joint with  osteophyte formation, joint effusion and probably small intra-articular loose bodies.  Original Report Authenticated By: Thomasenia Sales, M.D.   Portable Chest Xray In Am  06/13/2011  *RADIOLOGY REPORT*  Clinical Data: 71 year old male with respiratory failure and on ventilator.  PORTABLE CHEST - 1 VIEW  Comparison: 06/12/2011 and prior chest radiographs  Findings: An endotracheal tube with tip 6.2 cm above the carina is again identified and appears satisfactorily positioned. Continued atelectasis/airspace disease throughout the left lung again noted. There is no evidence of pneumothorax. There is been little interval change since the prior study.  IMPRESSION: Stable chest  radiograph with continued left lung airspace opacities and stable endotracheal tube.  Original Report Authenticated By: Rosendo Gros, M.D.   Dg Chest Port 1 View  06/13/2011  *RADIOLOGY REPORT*  Clinical Data: Repositioned endotracheal tube.  PORTABLE CHEST - 1 VIEW  Comparison: Earlier today.  Findings: The endotracheal tube has been advanced and is in satisfactory position.  Progressive left lung airspace opacity, especially in the left lower lobe, where dense opacity is currently demonstrated.  Interval small amount of linear density at the right lung base.  Normal sized heart.  IMPRESSION:  1.  Satisfactory position of the endotracheal tube. 2.  Progressive left lung airspace opacity, especially in the left lower lobe, suspicious for pneumonia. 3.  Interval minimal right basilar atelectasis.  Original Report Authenticated By: Darrol Angel, M.D.   Dg Chest Port 1 View  06/12/2011  *RADIOLOGY REPORT*  Clinical Data: Endotracheal tube repositioned.  PORTABLE CHEST - 1 VIEW  Comparison: Earlier today.  Findings: The endotracheal tube has been retracted with its tip at the thoracic inlet.  Significantly improved aeration of the left lung with residual patchy airspace opacity.  Clear right lung. Normal sized heart.  Improved mediastinal shift to the left.  IMPRESSION:  1.  Endotracheal tube tip at the thoracic inlet.  It is recommended that this be advanced 4 cm. 2.  Partial re-expansion of the left lung with residual pneumonia or re-expansion pulmonary edema.  Original Report Authenticated By: Darrol Angel, M.D.   Dg Chest Port 1 View  06/12/2011  *RADIOLOGY REPORT*  Clinical Data: Intubated.  PORTABLE CHEST - 1 VIEW  Comparison: 06/12/2011.  Findings: Interval endotracheal tube with its tip in the right mainstem bronchus.  Interval complete opacification of the left hemithorax.  Clear right lung.  Mediastinal shift to the left. Unremarkable bones.  IMPRESSION: Endotracheal tube tip in the right mainstem  bronchus with associated complete collapse of the left lung.  It is recommended that this be retracted 7 cm.  These results were called by telephone on 06/12/2011  at  2215 hours to  [facility], the patient's nurse, who verbally acknowledged these results.  Original Report Authenticated By: Darrol Angel, M.D.   Dg Knee Complete 4 Views Left  06/12/2011  *RADIOLOGY REPORT*  Clinical Data: Pain and swelling.  Fluid aspiration.  LEFT KNEE - COMPLETE 4+ VIEW  Comparison: None.  Findings: There is a joint effusion.  A few dots of a air are notable in the suprapatellar region, presumably related to the recent aspiration.  Components of the total knee replacement appear unremarkable.  IMPRESSION: Previous total knee replacement.  Joint effusion.  A few dots of a air in the suprapatellar region presumably related to the recent aspiration.  Original Report Authenticated By: Thomasenia Sales, M.D.   Dg Knee Complete 4 Views Right  06/12/2011  *RADIOLOGY REPORT*  Clinical Data: Pain.  Fluid  aspiration.  RIGHT KNEE - COMPLETE 4+ VIEW  Comparison: None.  Findings: There is advanced tricompartmental osteoarthritis with joint space narrowing and osteophyte formation.  There is a small amount of joint fluid.  No visible intra-articular loose body.  No fracture or other focal lesion.  Arterial calcification is noted.  IMPRESSION: Advanced tricompartmental osteoarthritis.  Small joint effusion.  Original Report Authenticated By: Thomasenia Sales, M.D.   .  ASSESSMENT AND PLAN  NEUROLOGIC Sedated for vent support. P: - extubate 3/10 , awake and alert. Resolved 3/11  PULMONARY  Lab 06/12/11 2346  PHART 7.336*  PCO2ART 43.2  PO2ART 317.0*  HCO3 22.4  O2SAT 99.9   A:  Acute resp failure CXR after surgical procedure with selective intubation Repeat CXR with reexpansion of L lung with residual infiltrates P: -->  extubated 3/10 -->  respiratory cultures pending -->  On vanc and Ceftriaxone to cover  knee  CARDIOVASCULAR P: -->  Hemodynamically stable  RENAL Lab Results  Component Value Date   CREATININE 4.32* 06/14/2011   CREATININE 2.73* 06/13/2011   CREATININE 1.98* 06/12/2011    A:  Acute on CKD P: Likely due to DM, HTN and intravasc vol depletion, worsening 3/10 Pt has also been having low oral intake due to septic arthritis -->  BMP am -->  Keep tank full    GASTROINTESTINAL No results found for this basename: AST:5,ALT:5,ALKPHOS:5,BILITOT:5,PROT:5,ALBUMIN:5 in the last 168 hours A:  Hx of colon ca s/p ileostomy P: Ileostomy with normal output.  INFECTIOUS  Lab 06/14/11 0340 06/13/11 0340 06/12/11 1120 06/08/11 0425  WBC 15.0* 17.9* 17.7* 12.8*  PROCALCITON -- -- -- --   A:  Septic arthritis s/p I/D - Hx of recent acute bronchitis P: -->  L knee/R knee +sa -->  Resp cultures and blood cultures++sa -->  On vanc and Ceftriaxone  ENDOCRINE  Lab 06/14/11 0803 06/14/11 0357 06/13/11 2351 06/13/11 1936 06/13/11 1558  GLUCAP 184* 190* 148* 241* 290*   A:  DM P: -->  ICU Hyperglycemia protocol, transition to standard protocol when taking PO  BEST PRACTICE / DISPOSITION -->  ICU status under PCCM -->  Full code -->  Advance diet -->  Enoxaparin, adjusted per kidney function -->  Protonix IV for GI Px   Brett Canales Minor ACNP Adolph Pollack PCCM Pager [phone] till 3 pm If no answer page [phone] 06/14/2011, 10:25 AM  Pt independently  seen and examined and available cxr's reviewed and I agree with above findings/ imp/ plan  The patient is critically ill with multiple organ systems failure and requires high complexity decision making for assessment and support, frequent evaluation and titration of therapies, application of advanced monitoring technologies and extensive interpretation of multiple databases. Critical Care Time devoted to patient care services described in this note is 45 minutes.   Most concerned at this point re deteriorating renal fx on vanc  hopefully this is mssa not mrsa but for now d/c vanc

## 2011-06-14 NOTE — Progress Notes (Signed)
Patient ID: [name], male   DOB: [birth year], 71 y.o.   MRN: [id number] PATIENT ID: [name]        MRN:  [id number]          DOB/AGE: Aug 25, 1940 / 71 y.o.  Logan Campbell, MD   [name], PA-C [address] [place], [place], [place]  [address]                             [phone]   PROGRESS NOTE  Subjective:  negative for Chest Pain  positive for Shortness of Breath  negative for Nausea/Vomiting   negative for Calf Pain  negative for Bowel Movement   Tolerating Diet: yes         Patient reports pain as mild.     Denies much in the way of any pain in either knee.  Objective: Vital signs in last 24 hours:   Patient Vitals for the past 24 hrs:  BP Temp Temp src Pulse Resp SpO2 Weight  06/14/11 0700 83/61 mmHg - - 83  19  95 % -  06/14/11 0600 107/59 mmHg - - 52  16  94 % -  06/14/11 0530 120/55 mmHg - - 55  19  98 % -  06/14/11 0500 125/50 mmHg - - 98  18  94 % -  06/14/11 0430 117/53 mmHg - - 105  15  96 % -  06/14/11 0400 106/55 mmHg 99.2 F (37.3 C) Oral 110  20  96 % -  06/14/11 0330 132/54 mmHg - - 107  17  96 % -  06/14/11 0300 144/57 mmHg - - 105  20  98 % -  06/14/11 0230 165/63 mmHg - - 122  16  96 % -  06/14/11 0200 158/59 mmHg - - 119  17  97 % 127.7 kg (281 lb 8.4 oz)  06/14/11 0130 136/59 mmHg - - 120  16  96 % -  06/14/11 0100 124/60 mmHg - - 115  16  98 % -  06/14/11 0045 131/61 mmHg - - 124  17  97 % -  06/14/11 0030 119/53 mmHg - - 125  17  96 % -  06/14/11 0015 110/62 mmHg - - 127  15  96 % -  06/14/11 0000 119/53 mmHg 100.1 F (37.8 C) Oral 127  16  95 % -  06/13/11 2322 147/77 mmHg - - 138  16  98 % -  06/13/11 2312 148/76 mmHg - - 77  21  92 % -  06/13/11 2300 85/41 mmHg - - 145  17  94 % -  06/13/11 2200 115/57 mmHg - - 98  15  94 % -  06/13/11 2000 106/50 mmHg 98.6 F (37 C) Oral 101  17  96 % -  06/13/11 1800 - - - 108  26  94 % -  06/13/11 1700 - - - 103  19  99 % -  06/13/11 1600 115/59 mmHg 97.9 F (36.6 C) Oral 99  21   99 % -  06/13/11 1500 132/60 mmHg - - 100  23  99 % -  06/13/11 1400 125/56 mmHg - - 101  15  100 % -  06/13/11 1300 119/65 mmHg - - 105  21  100 % -  06/13/11 1200 131/49 mmHg 98.8 F (37.1 C) Oral 105  22  95 % -  06/13/11 1100 124/46 mmHg - -  97  17  100 % -  06/13/11 1000 - - - 94  20  99 % -  06/13/11 0900 128/61 mmHg - - 96  16  100 % -  06/13/11 0800 - 99 F (37.2 C) Oral 98  18  100 % -      Intake/Output from previous day:   03/10 0701 - 03/11 0700 In: 3698 [P.O.:1320; I.V.:2268] Out: 1862 [Urine:1272; Drains:140]   Intake/Output this shift:       Intake/Output      03/10 0701 - 03/11 0700 03/11 0701 - 03/12 0700   P.O. 1320    I.V. (mL/kg) 2268 (17.8)    IV Piggyback 110    Total Intake(mL/kg) 3698 (29)    Urine (mL/kg/hr) 1272 (0.4)    Drains 140    Stool 450    Blood     Total Output 1862    Net +1836            LABORATORY DATA:  Basename 06/14/11 0340 06/13/11 0340 06/12/11 1120 06/08/11 0425  WBC 15.0* 17.9* 17.7* 12.8*  HGB 9.7* 10.5* 12.1* 12.0*  HCT 28.2* 30.1* 34.7* 34.0*  PLT 125* 135* 157 180    Basename 06/14/11 0340 06/13/11 0340 06/12/11 1120 06/08/11 0425  NA 128* 129* 130* 136  K 4.6 5.0 4.4 4.5  CL 96 97 94* 99  CO2 20 24 26 27   BUN 59* 45* 41* 38*  CREATININE 4.32* 2.73* 1.98* 1.17  GLUCOSE 192* 215* 157* 208*  CALCIUM 8.3* 8.2* 9.2 9.7   Lab Results  Component Value Date   INR 1.19 06/12/2011   INR 1.99* 01/28/2009   INR 1.87* 01/27/2009    Examination:  General appearance: alert, cooperative, mild distress and morbidly obese  Wound Exam: clean, dry, intact   Drainage:  None: wound tissue dry  Motor Exam: EHL, FHL, Anterior Tibial and Posterior Tibial Intact  Sensory Exam: Superficial Peroneal, Deep Peroneal and Tibial normal  Vascular Exam: cap refill less than 2 seconds   Assessment:    2 Days Post-Op  Procedure(s) (LRB): IRRIGATION AND DEBRIDEMENT KNEE WITH POLY EXCHANGE (Left)  Staph aureus in blood and left  knee  ADDITIONAL DIAGNOSIS:  Active Problems:  DM (diabetes mellitus), type 2 with complications  HYPERCHOLESTEROLEMIA  IIA  GOUT  ANEMIA, NORMOCYTIC, CHRONIC  DEPRESSION  HYPERTENSION, UNSPECIFIED  CAD, UNSPECIFIED SITE  HX, PERSONAL, MALIGNANCY, RECTUM/ANUS  Bronchitis  Effusion of left knee  ARF (acute renal failure)  CKD (chronic kidney disease) stage 3, GFR 30-59 ml/min  Morbid obesity  Leukocytosis  Hyponatremia     Plan: Physical Therapy as ordered Weight Bearing as Tolerated (WBAT)  DVT Prophylaxis:  Lovenox  DISCHARGE PLAN: Home  DISCHARGE NEEDS: HHPT, CPM, Walker, 3-in-1 comode seat and IV Antibiotics  ID consult ordered and Dr Orvan Falconer spoken to by Dr Cleophas Dunker [facility] Line ordered CPM ordered        [facility] 06/14/2011, 7:51 AM

## 2011-06-14 NOTE — Consult Note (Addendum)
Infectious Diseases Initial Consultation                Day 3 vancomycin        Day 3 ceftriaxone  Date of Admission:  06/12/2011  Date of Consult:  06/14/2011  Reason for Consult: Staph aureus prosthetic knee infection Referring Physician: Dr. Norlene    Problem List:  Active Problems:  Infection of prosthetic left knee joint  Staphylococcus aureus bacteremia  ARF (acute renal failure)  DM (diabetes mellitus), type 2 with complications  HYPERCHOLESTEROLEMIA  IIA  GOUT  ANEMIA, NORMOCYTIC, CHRONIC  DEPRESSION  HYPERTENSION, UNSPECIFIED  CAD, UNSPECIFIED SITE  HX, PERSONAL, MALIGNANCY, RECTUM/ANUS  Bronchitis  CKD (chronic kidney disease) stage 3, GFR 30-59 ml/min  Morbid obesity  Leukocytosis  Hyponatremia   Recommendations: 1. Continue vancomycin pending final culture results 2. Add rifampin 3. Discontinue ceftriaxone 4. Repeat blood cultures 5. Transthoracic echocardiogram   Assessment: Mr. Aldana has staph aureus bacteremia complicated by left prosthetic knee infection. I will continue vancomycin pending antibiotic susceptibility testing and add rifampin. He will need repeat blood cultures to verify clearing of his bacteremia and a transthoracic echocardiogram to screen for any evidence of endocarditis. He does not have any evidence of heart block or other conduction abnormalities on EKG suggesting complicated of aortic valve endocarditis. Once his blood cultures are negative he will need to be converted to a PICC in anticipation of at least 6 weeks of outpatient IV antibiotic therapy. He will probably need a minimum of 6 weeks of total antibiotic therapy to get the best possible chance of cure while salvaging the prosthesis.    HPI: [name] is a 71 y.o. male who was recently hospitalized for treatment of bronchitis from March 1 through March 5. After discharge began to develop severe left knee pain and swelling in his prosthetic left knee. He saw his  primary care doctor, Dr. Burnell Blanks, on March 8 and his knee was tapped. Purulent fluid was obtained. I called her office but could not locate culture results the specimen that was sent to [facility]. His effusion and pain increased leading to his admission on March 9. Cultures here of left synovial fluid and blood are both growing staph aureus with sensitivities pending. Yesterday he underwent open debridement with poly-exchange and placement of vancomycin and tobramycin impregnated beads.   Review of Systems: Pertinent items are noted in HPI.     Marland Kitchen aspirin  81 mg Oral Daily  . cefTRIAXone (ROCEPHIN)  IV  1 g Intravenous Q24H  . diltiazem  10 mg Intravenous Once  . enoxaparin  40 mg Subcutaneous Q24H  . exenatide  5 mcg Subcutaneous Daily  . ezetimibe-simvastatin  1 tablet Oral QHS  . insulin aspart  0-7 Units Subcutaneous Q4H  . insulin aspart  16 Units Subcutaneous Q breakfast  . insulin aspart  16-18 Units Subcutaneous Q lunch  . insulin aspart  18 Units Subcutaneous QAC supper  . insulin glargine  70 Units Subcutaneous QHS  . mulitivitamin with minerals  1 tablet Oral Daily  . pantoprazole (PROTONIX) IV  40 mg Intravenous Daily  . PARoxetine  10 mg Oral Q2000  . PARoxetine  20 mg Oral QAC breakfast  . predniSONE  10 mg Oral Q breakfast  . DISCONTD: antiseptic oral rinse  1 application Mouth Rinse QID  . DISCONTD: chlorhexidine  15 mL Mouth/Throat BID  . DISCONTD: omega-3 acid ethyl esters  4 g Oral Daily  . DISCONTD: vancomycin  1,750  mg Intravenous Q48H    Past Medical History  Diagnosis Date  . Gout   . Depression   . Diabetes mellitus   . Unspecified essential hypertension   . Hypercholesteremia     IIA  . CAD (coronary artery disease)   . Normocytic anemia     Chronic  . Colitis, ulcerative     NOS  . Personal hx-rectal/anal malignancy   . Urinary incontinence   . Benign prostatic hypertrophy   . H/O ETOH abuse 06/06/2011  . Uncontrolled secondary diabetes  mellitus with stage 3 CKD (GFR 30-59) 06/05/2011  . History of left knee surgery   . Pleurisy   . Cancer 2006    bladder    History  Substance Use Topics  . Smoking status: Former Smoker -- 2.0 packs/day for 15 years    Types: Cigarettes    Quit date: 06/04/1970  . Smokeless tobacco: Current User    Types: Chew  . Alcohol Use: No    Family History  Problem Relation Age of Onset  . Dementia Mother   . COPD Father    Allergies  Allergen Reactions  . Fentanyl   . Hydromorphone Hcl   . Lorazepam   . Oxycodone-Acetaminophen     OBJECTIVE: Blood pressure 83/61, pulse 83, temperature 99.1 F (37.3 C), temperature source Axillary, resp. rate 19, height 6' (1.829 m), weight 126.1 kg (278 lb), SpO2 95.00%. General: He is alert and comfortable reading the newspaper Skin: He has some scattered ecchymoses but no splinter or conjunctival hemorrhages Lungs: He has a few scattered wheezes Cor: He has distant heart sounds. I do not hear any murmurs Abdomen: Soft obese and nontender His right knee is in a soft brace when operative dressing intact  Microbiology: Recent Results (from the past 240 hour(s))  BODY FLUID CULTURE     Status: Normal (Preliminary result)   Collection Time   06/12/11 11:40 AM      Component Value Range Status Comment   Specimen Description KNEE JOINT LEFT   Final    Special Requests Normal   Final    Gram Stain     Final    Value: CYTOSPIN SLIDE WBC PRESENT,BOTH PMN AND MONONUCLEAR     ABUNDANT GRAM POSITIVE COCCI IN PAIRS   Culture     Final    Value: ABUNDANT STAPHYLOCOCCUS AUREUS     Note: RIFAMPIN AND GENTAMICIN SHOULD NOT BE USED AS SINGLE DRUGS FOR TREATMENT OF STAPH INFECTIONS.   Report Status PENDING   Incomplete   BODY FLUID CULTURE     Status: Normal (Preliminary result)   Collection Time   06/12/11 12:10 PM      Component Value Range Status Comment   Specimen Description KNEE JOINT RIGHT   Final    Special Requests NONE   Final    Gram Stain      Final    Value: CYTOSPIN SLIDE WBC PRESENT, PREDOMINANTLY PMN     NO ORGANISMS SEEN   Culture NO GROWTH 2 DAYS   Final    Report Status PENDING   Incomplete   CULTURE, BLOOD (ROUTINE X 2)     Status: Normal (Preliminary result)   Collection Time   06/12/11  3:00 PM      Component Value Range Status Comment   Specimen Description BLOOD RIGHT ARM  5 ML IN [facility] BOTTLE   Final    Special Requests NONE   Final    Culture  Setup Time [id number]  Final    Culture     Final    Value: STAPHYLOCOCCUS AUREUS     Note: RIFAMPIN AND GENTAMICIN SHOULD NOT BE USED AS SINGLE DRUGS FOR TREATMENT OF STAPH INFECTIONS.     Note: Gram Stain Report Called to,Read Back By and Verified With: RN M. STOCKS ON 06/13/11 AT 2005 BY TEDAR   Report Status PENDING   Incomplete   CULTURE, BLOOD (ROUTINE X 2)     Status: Normal (Preliminary result)   Collection Time   06/12/11  3:10 PM      Component Value Range Status Comment   Specimen Description BLOOD LEFT ARM  3 ML IN AEROBIC ONLY   Final    Special Requests NONE   Final    Culture  Setup Time [id number]   Final    Culture     Final    Value: STAPHYLOCOCCUS AUREUS     Note: RIFAMPIN AND GENTAMICIN SHOULD NOT BE USED AS SINGLE DRUGS FOR TREATMENT OF STAPH INFECTIONS.     Note: Gram Stain Report Called to,Read Back By and Verified With: RN M. STOCKS ON 06/13/11 AT 2005 BY TEDAR   Report Status PENDING   Incomplete   MRSA PCR SCREENING     Status: Normal   Collection Time   06/12/11  5:46 PM      Component Value Range Status Comment   MRSA by PCR NEGATIVE  NEGATIVE  Final   ANAEROBIC CULTURE     Status: Normal (Preliminary result)   Collection Time   06/12/11  7:40 PM      Component Value Range Status Comment   Specimen Description WOUND LEFT KNEE   Final    Special Requests NONE   Final    Gram Stain     Final    Value: FEW WBC PRESENT, PREDOMINANTLY PMN     NO SQUAMOUS EPITHELIAL CELLS SEEN     MODERATE GRAM POSITIVE COCCI IN PAIRS   Culture     Final     Value: NO ANAEROBES ISOLATED; CULTURE IN PROGRESS FOR 5 DAYS   Report Status PENDING   Incomplete   WOUND CULTURE     Status: Normal (Preliminary result)   Collection Time   06/12/11  7:40 PM      Component Value Range Status Comment   Specimen Description WOUND LEFT KNEE   Final    Special Requests NONE   Final    Gram Stain     Final    Value: RARE WBC PRESENT, PREDOMINANTLY PMN     NO SQUAMOUS EPITHELIAL CELLS SEEN     FEW GRAM POSITIVE COCCI IN PAIRS   Culture     Final    Value: MODERATE STAPHYLOCOCCUS AUREUS     Note: RIFAMPIN AND GENTAMICIN SHOULD NOT BE USED AS SINGLE DRUGS FOR TREATMENT OF STAPH INFECTIONS.   Report Status PENDING   Incomplete   CULTURE, RESPIRATORY     Status: Normal (Preliminary result)   Collection Time   06/12/11 11:50 PM      Component Value Range Status Comment   Specimen Description SPUTUM   Final    Special Requests NONE TRACHAEL ASPPIRATE   Final    Gram Stain PENDING   Incomplete    Culture Culture reincubated for better growth   Final    Report Status PENDING   Incomplete   URINE CULTURE     Status: Normal   Collection Time   06/13/11  5:47 AM  Component Value Range Status Comment   Specimen Description URINE, CATHETERIZED   Final    Special Requests NONE   Final    Culture  Setup Time [id number]   Final    Colony Count NO GROWTH   Final    Culture NO GROWTH   Final    Report Status 06/14/2011 FINAL   Final     Cliffton Asters, MD [facility] [phone] pager   [phone] cell 06/14/2011, 3:42 PM

## 2011-06-14 NOTE — Progress Notes (Signed)
ANTIBIOTIC CONSULT NOTE - FOLLOW UP  Pharmacy Consult for Vancomycin Indication: Staph aureus bacteremia complicated by left prosthetic knee infection   Allergies  Allergen Reactions  . Fentanyl   . Hydromorphone Hcl   . Lorazepam   . Oxycodone-Acetaminophen     Patient Measurements: Height: 6' (182.9 cm) Weight: 278 lb (126.1 kg) IBW/kg (Calculated) : 77.6  Adjusted Body Weight: 96.6kg  Vital Signs: Temp: 98.8 F (37.1 C) (03/11 1600) Temp src: Oral (03/11 1600) Intake/Output from previous day: 03/10 0701 - 03/11 0700 In: 3698 [P.O.:1320; I.V.:2268; IV Piggyback:110] Out: 1862 [Urine:1272; Drains:140; Stool:450] Intake/Output from this shift:    Labs:  Basename 06/14/11 0340 06/13/11 0340 06/12/11 1120  WBC 15.0* 17.9* 17.7*  HGB 9.7* 10.5* 12.1*  PLT 125* 135* 157  LABCREA -- -- --  CREATININE 4.32* 2.73* 1.98*   Estimated Creatinine Clearance: 21.2 ml/min (by C-G formula based on Cr of 4.32).   24 ml/min (normalized)  Microbiology: 3/9 L knee joint: abundant staph aureus, sens pending 3/9 R knee joint: ng x 2 days 3/9 Trach asp cx pending 3/9 Blood x2: staph aureus 3/10 Urine NG F  Anti-infectives     Start     Dose/Rate Route Frequency Ordered Stop   06/14/11 1630   rifampin (RIFADIN) capsule 600 mg        600 mg Oral Daily 06/14/11 1556     06/14/11 1600   vancomycin (VANCOCIN) 1,750 mg in sodium chloride 0.9 % 500 mL IVPB  Status:  Discontinued        1,750 mg 250 mL/hr over 120 Minutes Intravenous Every 48 hours 06/13/11 1305 06/14/11 1323   06/13/11 1600   vancomycin (VANCOCIN) 1,500 mg in sodium chloride 0.9 % 500 mL IVPB  Status:  Discontinued        1,500 mg 250 mL/hr over 120 Minutes Intravenous Every 24 hours 06/12/11 1512 06/13/11 1304   06/12/11 1932   tobramycin (NEBCIN) powder  Status:  Discontinued          As needed 06/12/11 1933 06/12/11 2047   06/12/11 1928   vancomycin (VANCOCIN) powder  Status:  Discontinued          As needed  06/12/11 1928 06/12/11 2047   06/12/11 1600   vancomycin (VANCOCIN) 2,500 mg in sodium chloride 0.9 % 500 mL IVPB        2,500 mg 250 mL/hr over 120 Minutes Intravenous  Once 06/12/11 1511 06/12/11 1849   06/12/11 1500   cefTRIAXone (ROCEPHIN) 1 g in dextrose 5 % 50 mL IVPB  Status:  Discontinued        1 g 100 mL/hr over 30 Minutes Intravenous Every 24 hours 06/12/11 1445 06/14/11 1556          Assessment:  Vancomycin was d/c today due to renal impairment  Vanc has been ordered to restart per pharmacy given staph aureus in blood in setting of prosthetic joint  Rifampin also added by ID (600mg  PO Daily)  Last vanc dose given was 06/12/11, 2500mg  @ 1649  Vanc dose was 1750mg  q48h, was to receive a dose today @ 1600  Scr has risen and is now 4.32, CrCl (normalized) ~ 64ml/min/1.73m2  Random vanc level checked tonight ~1630 is 16.8  Goal of Therapy:  Vancomycin trough level 15-20 mcg/ml  Plan:   Will continue with 1750mg  x 1 dose tonight  F/U Scr.  Check level ~48 hr and dose based on levels  Gwen Her PharmD  [phone] 06/14/2011 7:47  PM

## 2011-06-14 NOTE — Progress Notes (Addendum)
06/13/11 0010- Late entry: Pt's sustaining 130s-160s, hypotensive. Pt repositioned, placed on @l  O2 [place]. MD made aware. Pt c/o being cold and having headache.  2315- BP stabilized, HR continues to sustain 130s-160s. Pt asymptomatic at this time. Heart rhythm changed to afib. 2345- Cardizem gtt started per MD orders with loading dose 10mg  given. Will continue to monitor.

## 2011-06-14 NOTE — Procedures (Signed)
Central Venous Catheter Insertion Procedure Note [name] [id number] Nov 10, 1940  Procedure: Insertion of Central Venous Catheter Indications: Assessment of intravascular volume  Procedure Details Consent: Risks of procedure as well as the alternatives and risks of each were explained to the (patient/caregiver).  Consent for procedure obtained. Time Out: Verified patient identification, verified procedure, site/side was marked, verified correct patient position, special equipment/implants available, medications/allergies/relevent history reviewed, required imaging and test results available.  Performed  Maximum sterile technique was used including antiseptics, cap, gloves, gown, hand hygiene, mask and sheet. Skin prep: Chlorhexidine; local anesthetic administered A antimicrobial bonded/coated triple lumen catheter was placed in the left internal jugular vein using the Seldinger technique. Ultrasound guidance used.yes Catheter placed to 20 cm. Blood aspirated via all 3 ports and then flushed x 3. Line sutured x 2 and dressing applied.  Evaluation Blood flow good Complications: No apparent complications Patient did tolerate procedure well. Chest X-ray ordered to verify placement.  CXR: pending.  Brett Canales Minor ACNP [facility] PCCM Pager [phone] till 3 pm If no answer page [phone] 06/14/2011, 1:42 PM   Pt independently  seen and examined and available cxr's reviewed and I supervised the above procedure  Sandrea Hughs, MD Pulmonary and Critical Care Medicine [facility] Cell [phone]

## 2011-06-14 NOTE — Progress Notes (Signed)
ANTIBIOTIC CONSULT NOTE - FOLLOW UP  Pharmacy Consult for Vancomycin Indication: Staph aureus bacteremia complicated by left prosthetic knee infection   Allergies  Allergen Reactions  . Fentanyl   . Hydromorphone Hcl   . Lorazepam   . Oxycodone-Acetaminophen     Patient Measurements: Height: 6' (182.9 cm) Weight: 278 lb (126.1 kg) IBW/kg (Calculated) : 77.6  Adjusted Body Weight: 96.6kg  Vital Signs: Temp: 99.1 F (37.3 C) (03/11 1200) Temp src: Axillary (03/11 1200) BP: 83/61 mmHg (03/11 0700) Pulse Rate: 83  (03/11 0700) Intake/Output from previous day: 03/10 0701 - 03/11 0700 In: 3698 [P.O.:1320; I.V.:2268; IV Piggyback:110] Out: 1862 [Urine:1272; Drains:140; Stool:450] Intake/Output from this shift: Total I/O In: 880 [P.O.:480; I.V.:400] Out: 370 [Urine:370]  Labs:  [facility] 06/14/11 0340 06/13/11 0340 06/12/11 1120  WBC 15.0* 17.9* 17.7*  HGB 9.7* 10.5* 12.1*  PLT 125* 135* 157  LABCREA -- -- --  CREATININE 4.32* 2.73* 1.98*   Estimated Creatinine Clearance: 21.2 ml/min (by C-G formula based on Cr of 4.32).   24 ml/min (normalized)  Microbiology: 3/9 L knee joint: abundant staph aureus, sens pending 3/9 R knee joint: ng x 2 days 3/9 Trach asp cx pending 3/9 Blood x2: staph aureus 3/10 Urine NG F  Anti-infectives     Start     Dose/Rate Route Frequency Ordered Stop   06/14/11 1630   rifampin (RIFADIN) capsule 600 mg        600 mg Oral Daily 06/14/11 1556     06/14/11 1600   vancomycin (VANCOCIN) 1,750 mg in sodium chloride 0.9 % 500 mL IVPB  Status:  Discontinued        1,750 mg 250 mL/hr over 120 Minutes Intravenous Every 48 hours 06/13/11 1305 06/14/11 1323   06/13/11 1600   vancomycin (VANCOCIN) 1,500 mg in sodium chloride 0.9 % 500 mL IVPB  Status:  Discontinued        1,500 mg 250 mL/hr over 120 Minutes Intravenous Every 24 hours 06/12/11 1512 06/13/11 1304   06/12/11 1932   tobramycin (NEBCIN) powder  Status:  Discontinued          As  needed 06/12/11 1933 06/12/11 2047   06/12/11 1928   vancomycin (VANCOCIN) powder  Status:  Discontinued          As needed 06/12/11 1928 06/12/11 2047   06/12/11 1600   vancomycin (VANCOCIN) 2,500 mg in sodium chloride 0.9 % 500 mL IVPB        2,500 mg 250 mL/hr over 120 Minutes Intravenous  Once 06/12/11 1511 06/12/11 1849   06/12/11 1500   cefTRIAXone (ROCEPHIN) 1 g in dextrose 5 % 50 mL IVPB  Status:  Discontinued        1 g 100 mL/hr over 30 Minutes Intravenous Every 24 hours 06/12/11 1445 06/14/11 1556          Assessment: Vancomycin was d/c today due to renal impairment Vanc has been ordered to restart per pharmacy given staph aureus in blood in setting of prosthetic joint Rifampin also added by ID (600mg  PO Daily) Last vanc dose given was 06/12/11, 2500mg  @ 1649 Vanc dose was 1750mg  q48h, was to receive a dose today @ 1600 Scr has risen and is now 4.32, CrCl (normalized) ~ 64ml/min/1.73m2  Goal of Therapy:  Vancomycin trough level 15-20 mcg/ml  Plan:   Will check random vanc level and dose based on levels  Gwen Her PharmD  [phone] 06/14/2011 4:16 PM

## 2011-06-14 NOTE — Progress Notes (Signed)
UR CHART REVIEWED; B Sidney Kann RN, BSN, MHA 

## 2011-06-15 ENCOUNTER — Inpatient Hospital Stay (HOSPITAL_COMMUNITY): Payer: Medicare Other

## 2011-06-15 LAB — BASIC METABOLIC PANEL
Chloride: 100 mEq/L (ref 96–112)
GFR calc Af Amer: 12 mL/min — ABNORMAL LOW (ref >90)
GFR calc non Af Amer: 10 mL/min — ABNORMAL LOW (ref >90)
Potassium: 4.7 mEq/L (ref 3.5–5.1)
Sodium: 131 mEq/L — ABNORMAL LOW (ref 135–145)

## 2011-06-15 LAB — CBC
Hemoglobin: 9.1 g/dL — ABNORMAL LOW (ref 13.0–17.0)
MCHC: 34.5 g/dL (ref 30.0–36.0)
RDW: 13.2 % (ref 11.5–15.5)
WBC: 14 10*3/uL — ABNORMAL HIGH (ref 4.0–10.5)

## 2011-06-15 LAB — CULTURE, BLOOD (ROUTINE X 2)

## 2011-06-15 LAB — WOUND CULTURE

## 2011-06-15 LAB — GLUCOSE, CAPILLARY
Glucose-Capillary: 276 mg/dL — ABNORMAL HIGH (ref 70–99)
Glucose-Capillary: 210 mg/dL — ABNORMAL HIGH (ref 70–99)

## 2011-06-15 LAB — BODY FLUID CULTURE

## 2011-06-15 MED ORDER — EZETIMIBE 10 MG PO TABS
10.0000 mg | ORAL_TABLET | Freq: Every day | ORAL | Status: DC
  Administered 2011-06-15 – 2011-06-23 (×9): 10 mg via ORAL
  Filled 2011-06-15 (×10): qty 1

## 2011-06-15 MED ORDER — RIFAMPIN 300 MG PO CAPS
600.0000 mg | ORAL_CAPSULE | Freq: Every day | ORAL | Status: DC
  Administered 2011-06-16 – 2011-06-24 (×9): 600 mg via ORAL
  Filled 2011-06-15 (×10): qty 2

## 2011-06-15 MED ORDER — PANTOPRAZOLE SODIUM 40 MG PO TBEC
40.0000 mg | DELAYED_RELEASE_TABLET | Freq: Every day | ORAL | Status: DC
  Administered 2011-06-16 – 2011-06-24 (×9): 40 mg via ORAL
  Filled 2011-06-15 (×9): qty 1

## 2011-06-15 MED ORDER — DILTIAZEM HCL 60 MG PO TABS
60.0000 mg | ORAL_TABLET | Freq: Four times a day (QID) | ORAL | Status: DC
  Administered 2011-06-15 – 2011-06-18 (×13): 60 mg via ORAL
  Filled 2011-06-15 (×17): qty 1

## 2011-06-15 MED ORDER — ATORVASTATIN CALCIUM 20 MG PO TABS
20.0000 mg | ORAL_TABLET | Freq: Every day | ORAL | Status: DC
  Administered 2011-06-15 – 2011-06-23 (×9): 20 mg via ORAL
  Filled 2011-06-15 (×10): qty 1

## 2011-06-15 MED ORDER — CEFAZOLIN SODIUM 1-5 GM-% IV SOLN
1.0000 g | Freq: Two times a day (BID) | INTRAVENOUS | Status: DC
  Administered 2011-06-15 – 2011-06-22 (×15): 1 g via INTRAVENOUS
  Filled 2011-06-15 (×19): qty 50

## 2011-06-15 NOTE — Progress Notes (Addendum)
Name: [name] MRN: [id number] DOB: 08/19/1940    LOS: 3  PCCM PROGRESS NOTE  Brief patient profile: 70 yowm with extensive PMHx including CAD, DM, alcohol abuse, DL, Hx of colon ca s/p colectomy and ileostomy, who was recently admitted due to acute bronchitis treated with antibiotics and steroid taper. Pt was able to go home and developed swelling and pain in the L knee. Pt was seen at the ortho urgent care and L knee was tapped with elevated WBC count. Pt was admitted to [facility] and underwent I/D on the L knee by orthopedics 3/10 and remained intubated after the procedure due to low O2 sats. CXR post procedure showed selective intubation of the R mainstem bronchus and the tube was pulled out a few cm and the L lung reexpanded. PCCM service was consulted for further vent management due to acute resp failure.  Lines / Drains:  3/10 ott>>3/10  Cultures: L knee 3/9 > MSSA Blood cultures 3/9 > MSSA MRSA 3/9 > neg   Antibiotics: Per ID as of 3/11 Vancomycin 3/9 >> 3/11 (acute renal failure) Ceftriaxone 3/9 > 3/10 3/11 rifampin per ID >>    Vital Signs: BP 148/60  Pulse 76  Temp(Src) 97.4 F (36.3 C) (Oral)  Resp 14  Ht 6' (1.829 m)  Wt 278 lb (126.1 kg)  BMI 37.70 kg/m2  SpO2 95% on 2l [place], but doesn't wear half the time.   Intake/Output Summary (Last 24 hours) at 06/15/11 0854 Last data filed at 06/15/11 0700  Gross per 24 hour  Intake 2633.83 ml  Output   2675 ml  Net -41.17 ml    CVP:  [7 mmHg-14 mmHg] 7 mmHg   Physical Examination: General:  NAD , Neuro:  Awake, following commands, synchronous, nonfocal HEENT:  PERRL, pink conjunctivae, moist membranes Neck:  Supple, no JVD   Cardiovascular:  RRR, no M/R/G Lungs:  Bilateral breath sounds with mild wheezes,no  rhonchi or crackles. Abdomen:  Soft, nontender, nondistended, bowel sounds present, colostomy with gas Musculoskeletal:  Moves all extremities, no pedal edema. Lt knee dressing intact. Skin:  No rash        Labs and Imaging:    Lab 06/15/11 0500 06/14/11 0340 06/13/11 0340  NA 131* 128* 129*  K 4.7 4.6 5.0  CL 100 96 97  CO2 20 20 24   BUN 73* 59* 45*  CREATININE 5.06* 4.32* 2.73*  GLUCOSE 240* 192* 215*    Lab 06/15/11 0500 06/14/11 0340 06/13/11 0340  HGB 9.1* 9.7* 10.5*  HCT 26.4* 28.2* 30.1*  WBC 14.0* 15.0* 17.9*  PLT 129* 125* 135*   US Renal  06/14/2011  *RADIOLOGY REPORT*  Clinical Data: Elevated creatinine.  RENAL/URINARY TRACT ULTRASOUND COMPLETE  Comparison:  MRI 07/30/2008, PET CT 03/18/2009  Findings:  Right Kidney:  10.0 cm.  No mass or hydronephrosis.  Left Kidney:  12.1 cm.  No mass or hydronephrosis.  Bladder:  The bladder is not visualized.  IMPRESSION: Normal-appearing kidneys.  Non visualized bladder.  Original Report Authenticated By: Patterson Hammersmith, M.D.   Dg Chest Port 1 View  06/15/2011  *RADIOLOGY REPORT*  Clinical Data: Extubation.History of diabetes. Hypercholesterolemia.  Depression.  Coronary artery disease.  PORTABLE CHEST - 1 VIEW  Comparison: 06/14/2011  Findings: Left IJ central line unchanged, with tip at high to mid SVC.  Normal heart size.  Cannot exclude small bilateral pleural effusions. No pneumothorax.  Mild pulmonary interstitial thickening is chronic and nonspecific.  Mild bibasilar airspace disease is new.  IMPRESSION:  1.  Decreased lung volumes with development of bibasilar airspace disease.  Most likely atelectasis. 2.  Cannot exclude small bilateral pleural effusions.  Original Report Authenticated By: Consuello Bossier, M.D.   Dg Chest Port 1 View  06/14/2011  *RADIOLOGY REPORT*  Clinical Data: Left IJ central line placement  PORTABLE CHEST - 1 VIEW  Comparison: 06/13/2011  Findings: 1358 hours.  Lung volumes are low. The lungs are clear without focal infiltrate, edema, pneumothorax or pleural effusion. Cardiopericardial silhouette is at upper limits of normal for size. Endotracheal tube has been removed in the interval.  A new left IJ central  line tip projects at the proximal SVC level, just beyond the innominate vein confluence.  No evidence for pneumothorax. Telemetry leads overlie the chest.  IMPRESSION: Left IJ central line tip projects at the proximal SVC level.  No evidence for pneumothorax.  Original Report Authenticated By: ERIC A. MANSELL, M.D.   .  ASSESSMENT AND PLAN  NEUROLOGIC Sedated for vent support. P: - extubated 3/10 , awake and alert. Resolved 3/11  PULMONARY  Lab 06/12/11 2346  PHART 7.336*  PCO2ART 43.2  PO2ART 317.0*  HCO3 22.4  O2SAT 99.9   A:  Acute resp failure post op   P: -->  extubated 3/10 -->  respiratory cultures not done    CARDIOVASCULAR P: -->  Hemodynamically stable and ready for mobilization  RENAL Lab Results  Component Value Date   CREATININE 5.06* 06/15/2011   CREATININE 4.32* 06/14/2011   CREATININE 2.73* 06/13/2011    A:  Acute on CKD P: Likely due to DM, HTN and intravasc vol depletion,  Pt had also been having low oral intake due to septic arthritis -->  BMP am -->  Keep tank full cvp 12 on  3/12 --off vanc as pf 3/11 -Renal US neg 3/11 -no nephrotoxin being given as ov 3/12  urine output is excellent and creat probably near plateau so should start to improve if keep tank full  GASTROINTESTINAL No results found for this basename: AST:5,ALT:5,ALKPHOS:5,BILITOT:5,PROT:5,ALBUMIN:5 in the last 168 hours A:  Hx of colon ca s/p ileostomy P: Ileostomy with normal output.  INFECTIOUS  Lab 06/15/11 0500 06/14/11 0340 06/13/11 0340 06/12/11 1120  WBC 14.0* 15.0* 17.9* 17.7*  PROCALCITON -- -- -- --   A:  Septic arthritis s/p I/D MSSA in blood and knee 3/12  - Hx of recent acute bronchitis P: -->  L knee/R knee +sa -->  Resp cultures and blood cultures++mssa  rx per id  ENDOCRINE  Lab 06/15/11 0806 06/14/11 2215 06/14/11 1949 06/14/11 1617 06/14/11 1545  GLUCAP 200* 282* 275* 279* 265*   A:  DM P: -->  ICU Hyperglycemia protocol, transition to  standard protocol when taking PO  BEST PRACTICE / DISPOSITION -->  ICU status under PCCM -->  Full code -->  Advance diet -->  Enoxaparin, adjusted per kidney function -->  Protonix po of gi prophylaxis   Brett Canales Minor ACNP Adolph Pollack PCCM Pager [phone] till 3 pm If no answer page [phone] 06/15/2011, 8:54 AM    Pt independently  seen and examined and available cxr's reviewed and I agree with above findings/ imp/ plan  discussed with wife at bedside  Rx mssa per id Mobilize ? To floor 3/13  Sandrea Hughs, MD Pulmonary and Critical Care Medicine [facility] Cell [phone]

## 2011-06-15 NOTE — Progress Notes (Signed)
Patient ID: [name], male   DOB: [birth year], 71 y.o.   MRN: [id number]  INFECTIOUS DISEASE PROGRESS NOTE    Date of Admission:  06/12/2011    Total days of antibiotics 4         Active Problems:  Infection of prosthetic left knee joint  Staphylococcus aureus bacteremia  ARF (acute renal failure)  DM (diabetes mellitus), type 2 with complications  HYPERCHOLESTEROLEMIA  IIA  GOUT  ANEMIA, NORMOCYTIC, CHRONIC  DEPRESSION  HYPERTENSION, UNSPECIFIED  CAD, UNSPECIFIED SITE  HX, PERSONAL, MALIGNANCY, RECTUM/ANUS  Bronchitis  CKD (chronic kidney disease) stage 3, GFR 30-59 ml/min  Morbid obesity  Leukocytosis  Hyponatremia      . aspirin  81 mg Oral Daily  . ezetimibe  10 mg Oral QHS   And  . atorvastatin  20 mg Oral QHS  .  ceFAZolin (ANCEF) IV  1 g Intravenous Q12H  . diltiazem  60 mg Oral Q6H  . enoxaparin  40 mg Subcutaneous Q24H  . insulin aspart  0-20 Units Subcutaneous TID WC  . insulin aspart  0-5 Units Subcutaneous QHS  . insulin glargine  70 Units Subcutaneous QHS  . mulitivitamin with minerals  1 tablet Oral Daily  . pantoprazole  40 mg Oral Q1200  . PARoxetine  10 mg Oral Q2000  . PARoxetine  20 mg Oral QAC breakfast  . predniSONE  10 mg Oral Q breakfast  . rifampin  600 mg Oral Daily  . vancomycin  1,750 mg Intravenous Once  . DISCONTD: exenatide  5 mcg Subcutaneous Daily  . DISCONTD: ezetimibe-simvastatin  1 tablet Oral QHS  . DISCONTD: insulin aspart  0-20 Units Subcutaneous Q4H  . DISCONTD: insulin aspart  0-20 Units Subcutaneous TID WC  . DISCONTD: insulin aspart  0-7 Units Subcutaneous Q4H  . DISCONTD: insulin aspart  16 Units Subcutaneous Q breakfast  . DISCONTD: insulin aspart  16-18 Units Subcutaneous Q lunch  . DISCONTD: insulin aspart  18 Units Subcutaneous QAC supper  . DISCONTD: pantoprazole (PROTONIX) IV  40 mg Intravenous Daily  . DISCONTD: rifampin  600 mg Oral Daily    Subjective: He states that his left knee pain is about the  same.  Objective: Temp:  [97.4 F (36.3 C)-99.8 F (37.7 C)] 98.4 F (36.9 C) (03/12 1600) Pulse Rate:  [76-151] 88  (03/12 1600) Resp:  [12-24] 12  (03/12 1600) BP: (127-188)/(56-84) 188/80 mmHg (03/12 1600) SpO2:  [93 %-98 %] 95 % (03/12 1600)  General: He is alert and eating dinner Skin: He has a left IJ central line Lungs: Clear Cor: Regular S1 and S2 no murmurs Is a bandage over his left knee incision there is 1 small area of bleeding distally  Lab Results Lab Results  Component Value Date   WBC 14.0* 06/15/2011   HGB 9.1* 06/15/2011   HCT 26.4* 06/15/2011   MCV 90.4 06/15/2011   PLT 129* 06/15/2011    Lab Results  Component Value Date   CREATININE 5.06* 06/15/2011   BUN 73* 06/15/2011   NA 131* 06/15/2011   K 4.7 06/15/2011   CL 100 06/15/2011   CO2 20 06/15/2011    Lab Results  Component Value Date   ALT 20 06/06/2011   AST 18 06/06/2011   ALKPHOS 46 06/06/2011   BILITOT 0.4 06/06/2011       Microbiology: Recent Results (from the past 240 hour(s))  BODY FLUID CULTURE     Status: Normal   Collection Time   06/12/11 11:40  AM      Component Value Range Status Comment   Specimen Description KNEE JOINT LEFT   Final    Special Requests Normal   Final    Gram Stain     Final    Value: CYTOSPIN SLIDE WBC PRESENT,BOTH PMN AND MONONUCLEAR     ABUNDANT GRAM POSITIVE COCCI IN PAIRS     [id number]   Culture     Final    Value: ABUNDANT STAPHYLOCOCCUS AUREUS     Note: RIFAMPIN AND GENTAMICIN SHOULD NOT BE USED AS SINGLE DRUGS FOR TREATMENT OF STAPH INFECTIONS.   Report Status 06/15/2011 FINAL   Final    Organism ID, Bacteria STAPHYLOCOCCUS AUREUS   Final   BODY FLUID CULTURE     Status: Normal (Preliminary result)   Collection Time   06/12/11 12:10 PM      Component Value Range Status Comment   Specimen Description KNEE JOINT RIGHT   Final    Special Requests NONE   Final    Gram Stain     Final    Value: CYTOSPIN SLIDE WBC PRESENT, PREDOMINANTLY PMN     NO ORGANISMS SEEN    Culture NO GROWTH 3 DAYS   Final    Report Status PENDING   Incomplete   CULTURE, BLOOD (ROUTINE X 2)     Status: Normal   Collection Time   06/12/11  3:00 PM      Component Value Range Status Comment   Specimen Description BLOOD RIGHT ARM  5 ML IN [facility] BOTTLE   Final    Special Requests NONE   Final    Culture  Setup Time [id number]   Final    Culture     Final    Value: STAPHYLOCOCCUS AUREUS     Note: RIFAMPIN AND GENTAMICIN SHOULD NOT BE USED AS SINGLE DRUGS FOR TREATMENT OF STAPH INFECTIONS. SUSCEPTIBILITIES PERFORMED ON PREVIOUS CULTURE WITHIN THE LAST 5 DAYS.     Note: Gram Stain Report Called to,Read Back By and Verified With: RN M. STOCKS ON 06/13/11 AT 2005 BY TEDAR   Report Status 06/15/2011 FINAL   Final   CULTURE, BLOOD (ROUTINE X 2)     Status: Normal   Collection Time   06/12/11  3:10 PM      Component Value Range Status Comment   Specimen Description BLOOD LEFT ARM  3 ML IN AEROBIC ONLY   Final    Special Requests NONE   Final    Culture  Setup Time [id number]   Final    Culture     Final    Value: STAPHYLOCOCCUS AUREUS     Note: RIFAMPIN AND GENTAMICIN SHOULD NOT BE USED AS SINGLE DRUGS FOR TREATMENT OF STAPH INFECTIONS.     Note: Gram Stain Report Called to,Read Back By and Verified With: RN M. STOCKS ON 06/13/11 AT 2005 BY TEDAR   Report Status 06/15/2011 FINAL   Final    Organism ID, Bacteria STAPHYLOCOCCUS AUREUS   Final   MRSA PCR SCREENING     Status: Normal   Collection Time   06/12/11  5:46 PM      Component Value Range Status Comment   MRSA by PCR NEGATIVE  NEGATIVE  Final   ANAEROBIC CULTURE     Status: Normal (Preliminary result)   Collection Time   06/12/11  7:40 PM      Component Value Range Status Comment   Specimen Description WOUND LEFT KNEE   Final  Special Requests NONE   Final    Gram Stain     Final    Value: FEW WBC PRESENT, PREDOMINANTLY PMN     NO SQUAMOUS EPITHELIAL CELLS SEEN     MODERATE GRAM POSITIVE COCCI IN PAIRS   Culture     Final     Value: NO ANAEROBES ISOLATED; CULTURE IN PROGRESS FOR 5 DAYS   Report Status PENDING   Incomplete   WOUND CULTURE     Status: Normal   Collection Time   06/12/11  7:40 PM      Component Value Range Status Comment   Specimen Description WOUND LEFT KNEE   Final    Special Requests NONE   Final    Gram Stain     Final    Value: RARE WBC PRESENT, PREDOMINANTLY PMN     NO SQUAMOUS EPITHELIAL CELLS SEEN     FEW GRAM POSITIVE COCCI IN PAIRS   Culture     Final    Value: MODERATE STAPHYLOCOCCUS AUREUS     Note: RIFAMPIN AND GENTAMICIN SHOULD NOT BE USED AS SINGLE DRUGS FOR TREATMENT OF STAPH INFECTIONS.   Report Status 06/15/2011 FINAL   Final    Organism ID, Bacteria STAPHYLOCOCCUS AUREUS   Final   CULTURE, RESPIRATORY     Status: Normal (Preliminary result)   Collection Time   06/12/11 11:50 PM      Component Value Range Status Comment   Specimen Description SPUTUM   Final    Special Requests NONE TRACHAEL ASPPIRATE   Final    Gram Stain     Final    Value: NO WBC SEEN     NO SQUAMOUS EPITHELIAL CELLS SEEN     NO ORGANISMS SEEN   Culture     Final    Value: MODERATE STAPHYLOCOCCUS AUREUS     Note: RIFAMPIN AND GENTAMICIN SHOULD NOT BE USED AS SINGLE DRUGS FOR TREATMENT OF STAPH INFECTIONS.   Report Status PENDING   Incomplete   URINE CULTURE     Status: Normal   Collection Time   06/13/11  5:47 AM      Component Value Range Status Comment   Specimen Description URINE, CATHETERIZED   Final    Special Requests NONE   Final    Culture  Setup Time [id number]   Final    Colony Count NO GROWTH   Final    Culture NO GROWTH   Final    Report Status 06/14/2011 FINAL   Final     Studies/Results: US Renal  06/14/2011  *RADIOLOGY REPORT*  Clinical Data: Elevated creatinine.  RENAL/URINARY TRACT ULTRASOUND COMPLETE  Comparison:  MRI 07/30/2008, PET CT 03/18/2009  Findings:  Right Kidney:  10.0 cm.  No mass or hydronephrosis.  Left Kidney:  12.1 cm.  No mass or hydronephrosis.  Bladder:  The  bladder is not visualized.  IMPRESSION: Normal-appearing kidneys.  Non visualized bladder.  Original Report Authenticated By: Patterson Hammersmith, M.D.   Dg Chest Port 1 View  06/15/2011  *RADIOLOGY REPORT*  Clinical Data: Extubation.History of diabetes. Hypercholesterolemia.  Depression.  Coronary artery disease.  PORTABLE CHEST - 1 VIEW  Comparison: 06/14/2011  Findings: Left IJ central line unchanged, with tip at high to mid SVC.  Normal heart size.  Cannot exclude small bilateral pleural effusions. No pneumothorax.  Mild pulmonary interstitial thickening is chronic and nonspecific.  Mild bibasilar airspace disease is new.  IMPRESSION:  1.  Decreased lung volumes with development of bibasilar airspace  disease.  Most likely atelectasis. 2.  Cannot exclude small bilateral pleural effusions.  Original Report Authenticated By: Consuello Bossier, M.D.   Dg Chest Port 1 View  06/14/2011  *RADIOLOGY REPORT*  Clinical Data: Left IJ central line placement  PORTABLE CHEST - 1 VIEW  Comparison: 06/13/2011  Findings: 1358 hours.  Lung volumes are low. The lungs are clear without focal infiltrate, edema, pneumothorax or pleural effusion. Cardiopericardial silhouette is at upper limits of normal for size. Endotracheal tube has been removed in the interval.  A new left IJ central line tip projects at the proximal SVC level, just beyond the innominate vein confluence.  No evidence for pneumothorax. Telemetry leads overlie the chest.  IMPRESSION: Left IJ central line tip projects at the proximal SVC level.  No evidence for pneumothorax.  Original Report Authenticated By: ERIC A. MANSELL, M.D.     Assessment: He has MSSA bacteremia with left prosthetic knee infection. This is further complicated by acute renal insufficiency. I will treat him with renally adjusted cefazolin and oral rifampin.  Plan: 1. Continue cefazolin and rifampin 2. Check repeat blood cultures 3. Convert to PICC once more stable and blood cultures  are negative   Cliffton Asters, MD [facility] [phone] pager   [phone] cell 06/15/2011, 5:32 PM

## 2011-06-15 NOTE — Progress Notes (Signed)
Patient ID: [name], male   DOB: [birth year], 71 y.o.   MRN: [id number] PATIENT ID: [name]        MRN:  [id number]          DOB/AGE: [birth year] / 71 y.o.  [name], MD   [name], PA-C [address] [place], [place], [place]  [address]                             [phone]   PROGRESS NOTE  Subjective:  negative for Chest Pain  negative for Shortness of Breath  negative for Nausea/Vomiting   negative for Calf Pain  negative for Bowel Movement   Tolerating Diet: yes         Patient reports pain as mild.    Objective: Vital signs in last 24 hours:   Patient Vitals for the past 24 hrs:  BP Temp Temp src Pulse Resp SpO2  06/15/11 0900 148/58 mmHg - - 80  20  98 %  06/15/11 0800 155/59 mmHg 97.4 F (36.3 C) Oral 81  17  98 %  06/15/11 0600 148/60 mmHg - - 76  14  95 %  06/15/11 0400 162/76 mmHg 98.6 F (37 C) Oral 77  12  96 %  06/15/11 0200 158/62 mmHg - - - 22  96 %  06/15/11 0000 144/57 mmHg 99 F (37.2 C) Oral - 14  93 %  06/14/11 2200 127/56 mmHg - - - 15  97 %  06/14/11 2000 161/77 mmHg 99.8 F (37.7 C) Oral 125  17  96 %  06/14/11 1930 149/84 mmHg - - 151  19  96 %  06/14/11 1900 - - - 150  21  96 %  06/14/11 1830 171/74 mmHg - - 148  21  97 %  06/14/11 1800 173/73 mmHg - - 148  21  98 %  06/14/11 1730 179/20 mmHg - - 147  25  94 %  06/14/11 1700 - - - 146  28  97 %  06/14/11 1630 168/91 mmHg - - 141  25  96 %  06/14/11 1600 146/65 mmHg 98.8 F (37.1 C) Oral 76  20  97 %  06/14/11 1530 139/67 mmHg - - 71  21  95 %  06/14/11 1500 129/60 mmHg - - 55  21  93 %  06/14/11 1430 - - - 72  20  94 %  06/14/11 1400 124/59 mmHg - - 60  23  97 %  06/14/11 1330 - - - 81  23  98 %  06/14/11 1300 100/73 mmHg - - 89  18  98 %  06/14/11 1230 - - - 51  12  97 %  06/14/11 1200 97/78 mmHg 99.1 F (37.3 C) Axillary 48  14  97 %  06/14/11 1130 111/49 mmHg - - 50  12  96 %  06/14/11 1100 113/48 mmHg - - 77  13  95 %    @flow {1959:LAST@   Intake/Output  from previous day:   03/11 0701 - 03/12 0700 In: 3326.1 [P.O.:480; I.V.:2346.1] Out: 2675 [Urine:2550]   Intake/Output this shift:       Intake/Output      03/11 0701 - 03/12 0700 03/12 0701 - 03/13 0700   P.O. 480    I.V. (mL/kg) 2346.1 (18.6)    IV Piggyback 500    Total Intake(mL/kg) 3326.1 (26.4)    Urine (mL/kg/hr) 2550 (0.8)  Drains     Stool 125    Total Output 2675    Net +651.1            LABORATORY DATA:  Basename 06/15/11 0500 06/14/11 0340 06/13/11 0340 06/12/11 1120  WBC 14.0* 15.0* 17.9* 17.7*  HGB 9.1* 9.7* 10.5* 12.1*  HCT 26.4* 28.2* 30.1* 34.7*  PLT 129* 125* 135* 157    Basename 06/15/11 0500 06/14/11 0340 06/13/11 0340 06/12/11 1120  NA 131* 128* 129* 130*  K 4.7 4.6 5.0 4.4  CL 100 96 97 94*  CO2 20 20 24 26   BUN 73* 59* 45* 41*  CREATININE 5.06* 4.32* 2.73* 1.98*  GLUCOSE 240* 192* 215* 157*  CALCIUM 9.3 8.3* 8.2* 9.2   Lab Results  Component Value Date   INR 1.19 06/12/2011   INR 1.99* 01/28/2009   INR 1.87* 01/27/2009    Examination:  General appearance: alert, cooperative, mild distress and morbidly obese  Wound Exam: clean, dry, intact   Drainage:  None: wound tissue dry  Motor Exam: EHL, FHL, Anterior Tibial and Posterior Tibial Intact  Sensory Exam: Superficial Peroneal, Deep Peroneal and Tibial normal  Vascular Exam: DP INTACT   Assessment:    3 Days Post-Op  Procedure(s) (LRB): IRRIGATION AND DEBRIDEMENT KNEE WITH POLY EXCHANGE (Left)  ADDITIONAL DIAGNOSIS:  Active Problems:  DM (diabetes mellitus), type 2 with complications  HYPERCHOLESTEROLEMIA  IIA  GOUT  ANEMIA, NORMOCYTIC, CHRONIC  DEPRESSION  HYPERTENSION, UNSPECIFIED  CAD, UNSPECIFIED SITE  HX, PERSONAL, MALIGNANCY, RECTUM/ANUS  Bronchitis  Infection of prosthetic left knee joint  ARF (acute renal failure)  CKD (chronic kidney disease) stage 3, GFR 30-59 ml/min  Morbid obesity  Leukocytosis  Hyponatremia  Staphylococcus aureus  bacteremia     Plan: Physical Therapy as ordered Weight Bearing as Tolerated (WBAT)  DVT Prophylaxis:  Lovenox  DISCHARGE PLAN: MAY NEED SNF OR HOME DEPENDING UPON HOME HELP  DISCHARGE NEEDS: HHPT, HHRN, CPM, Walker, 3-in-1 comode seat and IV Antibiotics  PT AS ORDERED CPM REFUSED BY PATIENT, DISCUSSED WITH PATIENT AND REQUESTED HIM TO USE THE MACHINE         Erby Sanderson 06/15/2011, 10:32 AM

## 2011-06-16 DIAGNOSIS — I059 Rheumatic mitral valve disease, unspecified: Secondary | ICD-10-CM

## 2011-06-16 LAB — CBC
HCT: 26.4 % — ABNORMAL LOW (ref 39.0–52.0)
Hemoglobin: 9.2 g/dL — ABNORMAL LOW (ref 13.0–17.0)
MCHC: 34.8 g/dL (ref 30.0–36.0)
MCV: 89.8 fL (ref 78.0–100.0)

## 2011-06-16 LAB — BASIC METABOLIC PANEL
BUN: 72 mg/dL — ABNORMAL HIGH (ref 6–23)
CO2: 18 mEq/L — ABNORMAL LOW (ref 19–32)
Chloride: 102 mEq/L (ref 96–112)
Creatinine, Ser: 4.9 mg/dL — ABNORMAL HIGH (ref 0.50–1.35)
GFR calc Af Amer: 12 mL/min — ABNORMAL LOW (ref >90)
Glucose, Bld: 200 mg/dL — ABNORMAL HIGH (ref 70–99)
Potassium: 4.6 mEq/L (ref 3.5–5.1)

## 2011-06-16 LAB — GLUCOSE, CAPILLARY: Glucose-Capillary: 216 mg/dL — ABNORMAL HIGH (ref 70–99)

## 2011-06-16 LAB — BODY FLUID CULTURE: Culture: NO GROWTH

## 2011-06-16 LAB — MAGNESIUM: Magnesium: 2.1 mg/dL (ref 1.5–2.5)

## 2011-06-16 LAB — CULTURE, RESPIRATORY W GRAM STAIN

## 2011-06-16 MED ORDER — DILTIAZEM HCL 25 MG/5ML IV SOLN
10.0000 mg | Freq: Once | INTRAVENOUS | Status: AC
  Administered 2011-06-16: 10 mg via INTRAVENOUS
  Filled 2011-06-16: qty 5

## 2011-06-16 MED ORDER — HYDRALAZINE HCL 20 MG/ML IJ SOLN
10.0000 mg | INTRAMUSCULAR | Status: DC | PRN
  Administered 2011-06-16: 20 mg via INTRAVENOUS
  Filled 2011-06-16: qty 1

## 2011-06-16 MED ORDER — LEVALBUTEROL HCL 0.63 MG/3ML IN NEBU
0.6300 mg | INHALATION_SOLUTION | Freq: Four times a day (QID) | RESPIRATORY_TRACT | Status: DC | PRN
  Administered 2011-06-19: 0.63 mg via RESPIRATORY_TRACT
  Filled 2011-06-16 (×2): qty 3

## 2011-06-16 NOTE — Progress Notes (Signed)
PT Cancellation Note  Treatment cancelled today due to medical issues with patient which prohibited therapy.  Pt HR 140 and  BP elevated --180/97. Will attempt again as schedule permits.  [facility] 06/16/2011, 8:24 AM

## 2011-06-16 NOTE — Progress Notes (Signed)
RN walked into room shortly before 2000 to assess pt, pt shaking violently stating "I'm freezing" and very agitated. Pt previously A&O now confused making hallucinatory statements and unable to follow commands. HR 120's, BP 182/99, RR 25-30, temp 98.6 oral, CVP 12. Charge RN also in room, emotional support provided to pt, O2 applied, rectal temp attempted but unable due to hx colostomy surgery. CCM notified, no new orders rec'd. RN spoke with pt's wife Kathie Rhodes by phone who noted pt seemed more confused today during her visit, and who stated pt has had no recent ETOH intake. Temp recheck 99.2 oral, continuing to monitor.

## 2011-06-16 NOTE — Progress Notes (Signed)
*  PRELIMINARY RESULTS* Echocardiogram 2D Echocardiogram has been performed.  [name] L 06/16/2011, 11:05 AM

## 2011-06-16 NOTE — Progress Notes (Signed)
Inpatient Diabetes Program Recommendations  AACE/ADA: New Consensus Statement on Inpatient Glycemic Control (2009)  Target Ranges:  Prepandial:   less than 140 mg/dL      Peak postprandial:   less than 180 mg/dL (1-2 hours)      Critically ill patients:  140 - 180 mg/dL   Results for KEE, DRUDGE (MRN [id number]) as of 06/16/2011 13:17  Ref. Range 06/15/2011 11:15 06/15/2011 16:44 06/15/2011 22:13 06/16/2011 07:48 06/16/2011 11:42  Glucose-Capillary Latest Range: 70-99 mg/dL 409 (H) 811 (H) 914 (H) 202 (H) 216 (H)    Inpatient Diabetes Program Recommendations Insulin - Meal Coverage: Add Novolog 6 units with meals   Patient takes large doses of Novolog with meals at home per med. Rec.  Thank you  Piedad Climes RN,BSN,CDE Inpatient Diabetes Coordinator

## 2011-06-16 NOTE — Progress Notes (Signed)
Name: [name] MRN: [id number] DOB: 22-Sep-1940    LOS: 4  [name] P[facility]    Brief patient profile: 70 yowm with extensive PMHx including CAD, DM, alcohol abuse, DL, Hx of colon ca s/p colectomy and ileostomy, who was recently admitted due to acute bronchitis treated with antibiotics and steroid taper. Pt was able to go home and developed swelling and pain in the L knee. Pt was seen at the ortho urgent care and L knee was tapped with elevated WBC count. Pt was admitted to [facility] and underwent I/D on the L knee by orthopedics 3/10 and remained intubated after the procedure due to low O2 sats. CXR post procedure showed selective intubation of the R mainstem bronchus and the tube was pulled out a few cm and the L lung reexpanded. PCCM service was consulted for further vent management due to acute resp failure.  Lines / Drains:  3/10 ott>>3/10  Cultures: L knee 3/9 > MSSA Blood cultures 3/9 > MSSA MRSA screen 3/9 > neg   Antibiotics: Per ID as of 3/11 Vancomycin 3/9 >> 3/11 (acute renal failure) Ceftriaxone 3/9 > 3/10 3/11 rifampin per ID >>  3/12 ancef for mssa per ID >>  Vital Signs: BP 180/97  Pulse 139  Temp(Src) 97.7 F (36.5 C) (Oral)  Resp 16  Ht 6' (1.829 m)  Wt 278 lb (126.1 kg)  BMI 37.70 kg/m2  SpO2 96% on 3l [place]   Intake/Output Summary (Last 24 hours) at 06/16/11 0851 Last data filed at 06/16/11 0800  Gross per 24 hour  Intake   1940 ml  Output   1535 ml  Net    405 ml    CVP:  [11 mmHg-15 mmHg] 12 mmHg      . sodium chloride 100 mL/hr (06/15/11 1354)    Physical Examination: General:  NAD , Neuro:  Awake, following commands,  nonfocal HEENT:  PERRL, pink conjunctivae, moist membranes Neck:  Supple, no JVD   Cardiovascular:  RRR, no M/R/G. Periods of st  Lungs:  Bilateral breath sounds with mild wheezes,no  rhonchi or crackles. Abdomen:  Soft, nontender, nondistended, bowel sounds present, colostomy with gas Musculoskeletal:  Moves all extremities, no  pedal edema. Lt knee dressing intact. Skin:  No rash      Labs and Imaging:    Lab 06/16/11 0445 06/15/11 0500 06/14/11 0340  NA 132* 131* 128*  K 4.6 4.7 4.6  CL 102 100 96  CO2 18* 20 20  BUN 72* 73* 59*  CREATININE 4.90* 5.06* 4.32*  GLUCOSE 200* 240* 192*    Lab 06/16/11 0445 06/15/11 0500 06/14/11 0340  HGB 9.2* 9.1* 9.7*  HCT 26.4* 26.4* 28.2*  WBC 10.8* 14.0* 15.0*  PLT 154 129* 125*   US Renal  06/14/2011  *RADIOLOGY REPORT*  Clinical Data: Elevated creatinine.  RENAL/URINARY TRACT ULTRASOUND COMPLETE  Comparison:  MRI 07/30/2008, PET CT 03/18/2009  Findings:  Right Kidney:  10.0 cm.  No mass or hydronephrosis.  Left Kidney:  12.1 cm.  No mass or hydronephrosis.  Bladder:  The bladder is not visualized.  IMPRESSION: Normal-appearing kidneys.  Non visualized bladder.  Original Report Authenticated By: Patterson Hammersmith, M.D.   Dg Chest Port 1 View  06/15/2011  *RADIOLOGY REPORT*  Clinical Data: Extubation.History of diabetes. Hypercholesterolemia.  Depression.  Coronary artery disease.  PORTABLE CHEST - 1 VIEW  Comparison: 06/14/2011  Findings: Left IJ central line unchanged, with tip at high to mid SVC.  Normal heart size.  Cannot  exclude small bilateral pleural effusions. No pneumothorax.  Mild pulmonary interstitial thickening is chronic and nonspecific.  Mild bibasilar airspace disease is new.  IMPRESSION:  1.  Decreased lung volumes with development of bibasilar airspace disease.  Most likely atelectasis. 2.  Cannot exclude small bilateral pleural effusions.  Original Report Authenticated By: Consuello Bossier, M.D.   Dg Chest Port 1 View  06/14/2011  *RADIOLOGY REPORT*  Clinical Data: Left IJ central line placement  PORTABLE CHEST - 1 VIEW  Comparison: 06/13/2011  Findings: 1358 hours.  Lung volumes are low. The lungs are clear without focal infiltrate, edema, pneumothorax or pleural effusion. Cardiopericardial silhouette is at upper limits of normal for size. Endotracheal  tube has been removed in the interval.  A new left IJ central line tip projects at the proximal SVC level, just beyond the innominate vein confluence.  No evidence for pneumothorax. Telemetry leads overlie the chest.  IMPRESSION: Left IJ central line tip projects at the proximal SVC level.  No evidence for pneumothorax.  Original Report Authenticated By: ERIC A. MANSELL, M.D.   .  ASSESSMENT AND PLAN  NEUROLOGIC Sedated for vent support. P: - extubated 3/10 , awake and alert. Resolved 3/11  PULMONARY  Lab 06/12/11 2346  PHART 7.336*  PCO2ART 43.2  PO2ART 317.0*  HCO3 22.4  O2SAT 99.9   A:  Acute resp failure post op   P: -->  extubated 3/10 -->  respiratory cultures not done -- 3/13 weaning fio2    CARDIOVASCULAR . Sinus tach 3/13 P: -- 3/13 sinus tach tx dilt 10 mg x 2. DC albuterol (2 txs 3/13) and start xopenex. Dc nicotine patch.  RENAL Lab Results  Component Value Date   CREATININE 4.90* 06/16/2011   CREATININE 5.06* 06/15/2011   CREATININE 4.32* 06/14/2011    A:  Acute on CKD P: Likely due to DM, HTN and intravasc vol depletion on Vanc Pt had also been having low oral intake due to septic arthritis -->  BMP daily -->  Keep tank full cvp 11 on  3/13 --off vanc as of  3/11 -Renal US neg 3/11  -3/13 creatine beginning to trend down with euvolemia by cvp and excellent uop  GASTROINTESTINAL No results found for this basename: AST:5,ALT:5,ALKPHOS:5,BILITOT:5,PROT:5,ALBUMIN:5 in the last 168 hours A:  Hx of colon ca s/p ileostomy P: Ileostomy with normal output.  INFECTIOUS  Lab 06/16/11 0445 06/15/11 0500 06/14/11 0340 06/13/11 0340 06/12/11 1120  WBC 10.8* 14.0* 15.0* 17.9* 17.7*  PROCALCITON -- -- -- -- --   A:  Septic arthritis s/p I/D. Ortho following .  MSSA in blood and knee 3/9.    - Hx of recent acute bronchitis    rx per id    ENDOCRINE  Lab 06/16/11 0748 06/15/11 2213 06/15/11 1644 06/15/11 1115 06/15/11 0806  GLUCAP 202* 210* 245* 276*  200*   A:  DM P: -->  ICU Hyperglycemia protocol, transition to standard protocol when taking PO   BEST PRACTICE / DISPOSITION -->  ICU status under PCCM -->  Full code -->  Advance diet -->  Enoxaparin, adjusted per kidney function -->  Protonix po of gi prophylaxis   Brett Canales Minor ACNP [facility] PCCM Pager [phone] till 3 pm If no answer page [phone] 06/16/2011, 8:51 AM  Pt independently  seen and examined and available cxr's reviewed and I agree with above findings/ imp/ plan  discussed with wife at bedside  Sandrea Hughs, MD Pulmonary and Critical Care Medicine [facility] Cell [phone]

## 2011-06-16 NOTE — Progress Notes (Signed)
eLink Physician-Brief Progress Note Patient Name: [name] DOB: 06/02/1940 MRN: [id number]  Date of Service  06/16/2011   HPI/Events of Note  Call from nurse reporting hypertension with SBP 193/89 and HR of 140.  Patient denies pain and is in no distress.  Recently converted to po diltizem   eICU Interventions  Plan: One time dose of 10 mg diltiazem IV   Intervention Category Intermediate Interventions: Arrhythmia - evaluation and management;Hypertension - evaluation and management  Caymen Dubray 06/16/2011, 6:24 AM

## 2011-06-16 NOTE — Progress Notes (Signed)
Patient ID: [name], male   DOB: [birth year], 71 y.o.   MRN: [id number]  INFECTIOUS DISEASE PROGRESS NOTE    Date of Admission:  06/12/2011   Total days of antibiotics 5         Active Problems:  Infection of prosthetic left knee joint  Staphylococcus aureus bacteremia  ARF (acute renal failure)  DM (diabetes mellitus), type 2 with complications  HYPERCHOLESTEROLEMIA  IIA  GOUT  ANEMIA, NORMOCYTIC, CHRONIC  DEPRESSION  HYPERTENSION, UNSPECIFIED  CAD, UNSPECIFIED SITE  HX, PERSONAL, MALIGNANCY, RECTUM/ANUS  Bronchitis  CKD (chronic kidney disease) stage 3, GFR 30-59 ml/min  Morbid obesity  Leukocytosis  Hyponatremia      . aspirin  81 mg Oral Daily  . ezetimibe  10 mg Oral QHS   And  . atorvastatin  20 mg Oral QHS  .  ceFAZolin (ANCEF) IV  1 g Intravenous Q12H  . diltiazem  10 mg Intravenous Once  . diltiazem  10 mg Intravenous Once  . diltiazem  60 mg Oral Q6H  . enoxaparin  40 mg Subcutaneous Q24H  . insulin aspart  0-20 Units Subcutaneous TID WC  . insulin aspart  0-5 Units Subcutaneous QHS  . insulin glargine  70 Units Subcutaneous QHS  . mulitivitamin with minerals  1 tablet Oral Daily  . pantoprazole  40 mg Oral Q1200  . PARoxetine  10 mg Oral Q2000  . PARoxetine  20 mg Oral QAC breakfast  . rifampin  600 mg Oral Daily  . DISCONTD: exenatide  5 mcg Subcutaneous Daily  . DISCONTD: ezetimibe-simvastatin  1 tablet Oral QHS  . DISCONTD: pantoprazole (PROTONIX) IV  40 mg Intravenous Daily  . DISCONTD: rifampin  600 mg Oral Daily     Objective: Temp:  [97.6 F (36.4 C)-99.2 F (37.3 C)] 97.7 F (36.5 C) (03/13 0800) Pulse Rate:  [80-140] 139  (03/13 0800) Resp:  [12-25] 16  (03/13 0800) BP: (157-193)/(62-117) 180/97 mmHg (03/13 0800) SpO2:  [94 %-99 %] 96 % (03/13 0800)  General: Confused overnight but currently alert and oriented talking with wife Postoperative dressing on left knee  Lab Results Lab Results  Component Value Date   WBC 10.8*  06/16/2011   HGB 9.2* 06/16/2011   HCT 26.4* 06/16/2011   MCV 89.8 06/16/2011   PLT 154 06/16/2011    Lab Results  Component Value Date   CREATININE 4.90* 06/16/2011   BUN 72* 06/16/2011   NA 132* 06/16/2011   K 4.6 06/16/2011   CL 102 06/16/2011   CO2 18* 06/16/2011    Lab Results  Component Value Date   ALT 20 06/06/2011   AST 18 06/06/2011   ALKPHOS 46 06/06/2011   BILITOT 0.4 06/06/2011       Microbiology: Recent Results (from the past 240 hour(s))  BODY FLUID CULTURE     Status: Normal   Collection Time   06/12/11 11:40 AM      Component Value Range Status Comment   Specimen Description KNEE JOINT LEFT   Final    Special Requests Normal   Final    Gram Stain     Final    Value: CYTOSPIN SLIDE WBC PRESENT,BOTH PMN AND MONONUCLEAR     ABUNDANT GRAM POSITIVE COCCI IN PAIRS     [id number]   Culture     Final    Value: ABUNDANT STAPHYLOCOCCUS AUREUS     Note: RIFAMPIN AND GENTAMICIN SHOULD NOT BE USED AS SINGLE DRUGS FOR TREATMENT OF STAPH INFECTIONS.  Report Status 06/15/2011 FINAL   Final    Organism ID, Bacteria STAPHYLOCOCCUS AUREUS   Final   BODY FLUID CULTURE     Status: Normal (Preliminary result)   Collection Time   06/12/11 12:10 PM      Component Value Range Status Comment   Specimen Description KNEE JOINT RIGHT   Final    Special Requests NONE   Final    Gram Stain     Final    Value: CYTOSPIN SLIDE WBC PRESENT, PREDOMINANTLY PMN     NO ORGANISMS SEEN   Culture NO GROWTH 3 DAYS   Final    Report Status PENDING   Incomplete   CULTURE, BLOOD (ROUTINE X 2)     Status: Normal   Collection Time   06/12/11  3:00 PM      Component Value Range Status Comment   Specimen Description BLOOD RIGHT ARM  5 ML IN [facility] BOTTLE   Final    Special Requests NONE   Final    Culture  Setup Time [id number]   Final    Culture     Final    Value: STAPHYLOCOCCUS AUREUS     Note: RIFAMPIN AND GENTAMICIN SHOULD NOT BE USED AS SINGLE DRUGS FOR TREATMENT OF STAPH INFECTIONS. SUSCEPTIBILITIES  PERFORMED ON PREVIOUS CULTURE WITHIN THE LAST 5 DAYS.     Note: Gram Stain Report Called to,Read Back By and Verified With: RN M. STOCKS ON 06/13/11 AT 2005 BY TEDAR   Report Status 06/15/2011 FINAL   Final   CULTURE, BLOOD (ROUTINE X 2)     Status: Normal   Collection Time   06/12/11  3:10 PM      Component Value Range Status Comment   Specimen Description BLOOD LEFT ARM  3 ML IN AEROBIC ONLY   Final    Special Requests NONE   Final    Culture  Setup Time [id number]   Final    Culture     Final    Value: STAPHYLOCOCCUS AUREUS     Note: RIFAMPIN AND GENTAMICIN SHOULD NOT BE USED AS SINGLE DRUGS FOR TREATMENT OF STAPH INFECTIONS.     Note: Gram Stain Report Called to,Read Back By and Verified With: RN M. STOCKS ON 06/13/11 AT 2005 BY TEDAR   Report Status 06/15/2011 FINAL   Final    Organism ID, Bacteria STAPHYLOCOCCUS AUREUS   Final   MRSA PCR SCREENING     Status: Normal   Collection Time   06/12/11  5:46 PM      Component Value Range Status Comment   MRSA by PCR NEGATIVE  NEGATIVE  Final   ANAEROBIC CULTURE     Status: Normal (Preliminary result)   Collection Time   06/12/11  7:40 PM      Component Value Range Status Comment   Specimen Description WOUND LEFT KNEE   Final    Special Requests NONE   Final    Gram Stain     Final    Value: FEW WBC PRESENT, PREDOMINANTLY PMN     NO SQUAMOUS EPITHELIAL CELLS SEEN     MODERATE GRAM POSITIVE COCCI IN PAIRS   Culture     Final    Value: NO ANAEROBES ISOLATED; CULTURE IN PROGRESS FOR 5 DAYS   Report Status PENDING   Incomplete   WOUND CULTURE     Status: Normal   Collection Time   06/12/11  7:40 PM      Component Value Range Status Comment  Specimen Description WOUND LEFT KNEE   Final    Special Requests NONE   Final    Gram Stain     Final    Value: RARE WBC PRESENT, PREDOMINANTLY PMN     NO SQUAMOUS EPITHELIAL CELLS SEEN     FEW GRAM POSITIVE COCCI IN PAIRS   Culture     Final    Value: MODERATE STAPHYLOCOCCUS AUREUS     Note:  RIFAMPIN AND GENTAMICIN SHOULD NOT BE USED AS SINGLE DRUGS FOR TREATMENT OF STAPH INFECTIONS.   Report Status 06/15/2011 FINAL   Final    Organism ID, Bacteria STAPHYLOCOCCUS AUREUS   Final   CULTURE, RESPIRATORY     Status: Normal   Collection Time   06/12/11 11:50 PM      Component Value Range Status Comment   Specimen Description SPUTUM   Final    Special Requests NONE TRACHAEL ASPPIRATE   Final    Gram Stain     Final    Value: NO WBC SEEN     NO SQUAMOUS EPITHELIAL CELLS SEEN     NO ORGANISMS SEEN   Culture     Final    Value: MODERATE STAPHYLOCOCCUS AUREUS     Note: RIFAMPIN AND GENTAMICIN SHOULD NOT BE USED AS SINGLE DRUGS FOR TREATMENT OF STAPH INFECTIONS.   Report Status 06/16/2011 FINAL   Final    Organism ID, Bacteria STAPHYLOCOCCUS AUREUS   Final   URINE CULTURE     Status: Normal   Collection Time   06/13/11  5:47 AM      Component Value Range Status Comment   Specimen Description URINE, CATHETERIZED   Final    Special Requests NONE   Final    Culture  Setup Time [id number]   Final    Colony Count NO GROWTH   Final    Culture NO GROWTH   Final    Report Status 06/14/2011 FINAL   Final     Studies/Results: US Renal  06/14/2011  *RADIOLOGY REPORT*  Clinical Data: Elevated creatinine.  RENAL/URINARY TRACT ULTRASOUND COMPLETE  Comparison:  MRI 07/30/2008, PET CT 03/18/2009  Findings:  Right Kidney:  10.0 cm.  No mass or hydronephrosis.  Left Kidney:  12.1 cm.  No mass or hydronephrosis.  Bladder:  The bladder is not visualized.  IMPRESSION: Normal-appearing kidneys.  Non visualized bladder.  Original Report Authenticated By: Patterson Hammersmith, M.D.   Dg Chest Port 1 View  06/15/2011  *RADIOLOGY REPORT*  Clinical Data: Extubation.History of diabetes. Hypercholesterolemia.  Depression.  Coronary artery disease.  PORTABLE CHEST - 1 VIEW  Comparison: 06/14/2011  Findings: Left IJ central line unchanged, with tip at high to mid SVC.  Normal heart size.  Cannot exclude small  bilateral pleural effusions. No pneumothorax.  Mild pulmonary interstitial thickening is chronic and nonspecific.  Mild bibasilar airspace disease is new.  IMPRESSION:  1.  Decreased lung volumes with development of bibasilar airspace disease.  Most likely atelectasis. 2.  Cannot exclude small bilateral pleural effusions.  Original Report Authenticated By: Consuello Bossier, M.D.   Dg Chest Port 1 View  06/14/2011  *RADIOLOGY REPORT*  Clinical Data: Left IJ central line placement  PORTABLE CHEST - 1 VIEW  Comparison: 06/13/2011  Findings: 1358 hours.  Lung volumes are low. The lungs are clear without focal infiltrate, edema, pneumothorax or pleural effusion. Cardiopericardial silhouette is at upper limits of normal for size. Endotracheal tube has been removed in the interval.  A new left IJ  central line tip projects at the proximal SVC level, just beyond the innominate vein confluence.  No evidence for pneumothorax. Telemetry leads overlie the chest.  IMPRESSION: Left IJ central line tip projects at the proximal SVC level.  No evidence for pneumothorax.  Original Report Authenticated By: ERIC A. MANSELL, M.D.     Assessment: He will be about 6 weeks of combination antibiotic therapy with IV cefazolin and oral rifampin for his MSSA prosthetic knee infection followed by several months of oral antibiotic therapy.  Plan: 1. Continue IV cefazolin and oral rifampin   Cliffton Asters, MD [facility] [phone] pager   [phone] cell 06/16/2011, 9:16 AM

## 2011-06-16 NOTE — Progress Notes (Signed)
Pt now calm and resting comfortably, A&O x 3. Recent vitals HR 101, BP 158/95, RR 18, temp 98.7 oral. Pt states "not feeling so well today" and apologetic about earlier episode of agitation, tearful at times. Continuing to monitor.

## 2011-06-16 NOTE — Progress Notes (Signed)
eLink Physician-Brief Progress Note Patient Name: [name] DOB: 04-14-1940 MRN: [id number]  Date of Service  06/16/2011   HPI/Events of Note     eICU Interventions  Hydralazine prn SBp > 180   Intervention Category Intermediate Interventions: Hypertension - evaluation and management  Deyjah Kindel V. 06/16/2011, 5:06 PM

## 2011-06-17 LAB — BASIC METABOLIC PANEL
CO2: 18 mEq/L — ABNORMAL LOW (ref 19–32)
Calcium: 9.9 mg/dL (ref 8.4–10.5)
Creatinine, Ser: 4.45 mg/dL — ABNORMAL HIGH (ref 0.50–1.35)
GFR calc non Af Amer: 12 mL/min — ABNORMAL LOW (ref >90)
Glucose, Bld: 215 mg/dL — ABNORMAL HIGH (ref 70–99)

## 2011-06-17 LAB — GLUCOSE, CAPILLARY

## 2011-06-17 NOTE — Evaluation (Signed)
Physical Therapy Evaluation Patient Details Name: [name] MRN: [id number] DOB: 1941-01-26 Today's Date: 06/17/2011  Problem List:  Patient Active Problem List  Diagnoses  . DM (diabetes mellitus), type 2 with complications  . HYPERCHOLESTEROLEMIA  IIA  . GOUT  . ANEMIA, NORMOCYTIC, CHRONIC  . DEPRESSION  . HYPERTENSION, UNSPECIFIED  . CAD, UNSPECIFIED SITE  . COLITIS, ULCERATIVE NOS  . BENIGN PROSTATIC HYPERTROPHY  . SYNCOPE AND COLLAPSE  . URINARY INCONTINENCE  . HX, PERSONAL, MALIGNANCY, RECTUM/ANUS  . Chest pain  . Bronchitis  . Hyperkalemia  . Normocytic anemia  . Uncontrolled secondary diabetes mellitus with stage 3 CKD (GFR 30-59)  . H/O ETOH abuse  . Infection of prosthetic left knee joint  . ARF (acute renal failure)  . CKD (chronic kidney disease) stage 3, GFR 30-59 ml/min  . Morbid obesity  . Leukocytosis  . Hyponatremia  . Staphylococcus aureus bacteremia    Past Medical History:  Past Medical History  Diagnosis Date  . Gout   . Depression   . Diabetes mellitus   . Unspecified essential hypertension   . Hypercholesteremia     IIA  . CAD (coronary artery disease)   . Normocytic anemia     Chronic  . Colitis, ulcerative     NOS  . Personal hx-rectal/anal malignancy   . Urinary incontinence   . Benign prostatic hypertrophy   . H/O ETOH abuse 06/06/2011  . 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 History of left knee surgery   . Pleurisy   . Cancer 2006    bladder   Past Surgical History:  Past Surgical History  Procedure Date  . Colon resection   . Transurethral resection of prostate   . Abdominal aortic aneurysm repair 1992    PT Assessment/Plan/Recommendation PT Assessment Clinical Impression Statement: pt will benefit from PT to maximize independence for next venue of care PT Recommendation/Assessment: Patient will need skilled PT in the acute care venue PT Problem List: Decreased  strength;Decreased range of motion;Decreased activity tolerance;Decreased balance;Decreased mobility;Decreased knowledge of use of DME PT Plan PT Frequency: Min 4X/week PT Treatment/Interventions: DME instruction;Gait training;Stair training;Functional mobility training;Therapeutic activities;Therapeutic exercise;Patient/family education PT Recommendation Follow Up Recommendations: Home health PT;Skilled nursing facility Equipment Recommended: Defer to next venue PT Goals  Acute Rehab PT Goals PT Goal Formulation: With patient/family Time For Goal Achievement: 2 weeks Pt will go Supine/Side to Sit: with supervision PT Goal: Supine/Side to Sit - Progress: Goal set today Pt will go Sit to Supine/Side: with min assist PT Goal: Sit to Supine/Side - Progress: Goal set today Pt will go Sit to Stand: with min assist PT Goal: Sit to Stand - Progress: Goal set today Pt will go Stand to Sit: with min assist PT Goal: Stand to Sit - Progress: Goal set today Pt will Ambulate: 16 - 50 feet;with min assist;with rolling walker PT Goal: Ambulate - Progress: Goal set today Pt will Perform Home Exercise Program: with supervision, verbal cues required/provided PT Goal: Perform Home Exercise Program - Progress: Goal set today  PT Evaluation Precautions/Restrictions  Precautions Precautions: Knee Restrictions LLE Weight Bearing: Weight bearing as tolerated Prior Functioning  Home Living Lives With: Spouse Receives Help From: Family Type of Home: House Home Layout: One level Home Access: Level entry (from garage) Additional Comments: house one level but with sunken den, 2 steps Prior Function Level of Independence: Independent with basic ADLs;Independent with gait Driving: Yes Cognition   Sensation/Coordination  Extremity Assessment RUE Assessment RUE Assessment: Within Functional Limits LUE Assessment LUE Assessment: Within Functional Limits RLE Assessment RLE Assessment:  (grossly  WFL) LLE Assessment LLE Assessment:  (able to assist with SLR; ankle wfl) Mobility (including Balance) Bed Mobility Bed Mobility: Yes Supine to Sit: 4: Min assist Supine to Sit Details (indicate cue type and reason): cues for lateral scooting and assist with LLE Sitting - Scoot to Edge of Bed: 4: Min assist Transfers Transfers: Yes Sit to Stand: 1: +2 Total assist;Patient percentage (comment);From elevated surface;From bed Sit to Stand Details (indicate cue type and reason): cues for hand placement; pt with difficulty following commands/motor planningpt 50% with RLE knee support to facilitate extension Stand to Sit: 1: +2 Total assist;Patient percentage (comment);To chair/3-in-1 Stand to Sit Details: cues for hands ans control of descent; pt 50% Ambulation/Gait Ambulation/Gait: Yes Ambulation/Gait Assistance: 1: +2 Total assist;Patient percentage (comment) Ambulation/Gait Assistance Details (indicate cue type and reason): pt65%; +2 for balance, safety, lines; pivotal steps;cues for sequence, RW distance from self and posture Ambulation Distance (Feet): 5 Feet Assistive device: Rolling walker Gait Pattern: Step-to pattern;Decreased step length - right;Decreased step length - left;Trunk flexed    Exercise    End of Session PT - End of Session Equipment Utilized During Treatment: Gait belt Activity Tolerance: Patient tolerated treatment well (limited by mental status, confusion) Patient left: in chair;with call bell in reach;with family/visitor present Nurse Communication: Mobility status for transfers;Mobility status for ambulation General Behavior During Session: [facility] for tasks performed Cognition: [facility] for tasks performed  [facility] 06/17/2011, 1:08 PM

## 2011-06-17 NOTE — Progress Notes (Signed)
Subjective: Patient with no complaints. Relates that he has been eating well. Had BM. Left knee pain ok.  Objective: Filed Vitals:   06/17/11 0300 06/17/11 0400 06/17/11 0500 06/17/11 0600  BP: 171/80 166/83 172/83 172/86  Pulse: 78 80 82 79  Temp:      TempSrc:      Resp: 13 14 14 14   Height:      Weight:      SpO2: 98% 98% 98% 98%   Weight change:   Intake/Output Summary (Last 24 hours) at 06/17/11 0814 Last data filed at 06/17/11 0600  Gross per 24 hour  Intake   2780 ml  Output   4125 ml  Net  -1345 ml    General: Alert, awake, flat affect, in no acute distress.  HEENT: No bruits, no goiter.  Heart: Regular rate and rhythm, without murmurs, rubs, gallops.  Lungs: CTA, bilateral air movement.  Abdomen: Soft, nontender, nondistended, positive bowel sounds.  Neuro: Grossly intact, nonfocal. Extremities; Left knee with clean dressing, suture healing.    Lab Results:  [facility] 06/17/11 0517 06/16/11 0445  NA 137 132*  K 4.4 4.6  CL 105 102  CO2 18* 18*  GLUCOSE 215* 200*  BUN 74* 72*  CREATININE 4.45* 4.90*  CALCIUM 9.9 10.1  MG -- 2.1  PHOS -- 5.4*    Basename 06/16/11 0445 06/15/11 0500  WBC 10.8* 14.0*  NEUTROABS -- --  HGB 9.2* 9.1*  HCT 26.4* 26.4*  MCV 89.8 90.4  PLT 154 129*    Micro Results: Recent Results (from the past 240 hour(s))  BODY FLUID CULTURE     Status: Normal   Collection Time   06/12/11 11:40 AM      Component Value Range Status Comment   Specimen Description KNEE JOINT LEFT   Final    Special Requests Normal   Final    Gram Stain     Final    Value: CYTOSPIN SLIDE WBC PRESENT,BOTH PMN AND MONONUCLEAR     ABUNDANT GRAM POSITIVE COCCI IN PAIRS     [id number]   Culture     Final    Value: ABUNDANT STAPHYLOCOCCUS AUREUS     Note: RIFAMPIN AND GENTAMICIN SHOULD NOT BE USED AS SINGLE DRUGS FOR TREATMENT OF STAPH INFECTIONS.   Report Status 06/15/2011 FINAL   Final    Organism ID, Bacteria STAPHYLOCOCCUS AUREUS   Final   BODY  FLUID CULTURE     Status: Normal   Collection Time   06/12/11 12:10 PM      Component Value Range Status Comment   Specimen Description KNEE JOINT RIGHT   Final    Special Requests NONE   Final    Gram Stain     Final    Value: CYTOSPIN SLIDE WBC PRESENT, PREDOMINANTLY PMN     NO ORGANISMS SEEN   Culture NO GROWTH 3 DAYS   Final    Report Status 06/16/2011 FINAL   Final   CULTURE, BLOOD (ROUTINE X 2)     Status: Normal   Collection Time   06/12/11  3:00 PM      Component Value Range Status Comment   Specimen Description BLOOD RIGHT ARM  5 ML IN [facility] BOTTLE   Final    Special Requests NONE   Final    Culture  Setup Time [id number]   Final    Culture     Final    Value: STAPHYLOCOCCUS AUREUS     Note: RIFAMPIN AND GENTAMICIN  SHOULD NOT BE USED AS SINGLE DRUGS FOR TREATMENT OF STAPH INFECTIONS. SUSCEPTIBILITIES PERFORMED ON PREVIOUS CULTURE WITHIN THE LAST 5 DAYS.     Note: Gram Stain Report Called to,Read Back By and Verified With: RN M. STOCKS ON 06/13/11 AT 2005 BY TEDAR   Report Status 06/15/2011 FINAL   Final   CULTURE, BLOOD (ROUTINE X 2)     Status: Normal   Collection Time   06/12/11  3:10 PM      Component Value Range Status Comment   Specimen Description BLOOD LEFT ARM  3 ML IN AEROBIC ONLY   Final    Special Requests NONE   Final    Culture  Setup Time [id number]   Final    Culture     Final    Value: STAPHYLOCOCCUS AUREUS     Note: RIFAMPIN AND GENTAMICIN SHOULD NOT BE USED AS SINGLE DRUGS FOR TREATMENT OF STAPH INFECTIONS.     Note: Gram Stain Report Called to,Read Back By and Verified With: RN M. STOCKS ON 06/13/11 AT 2005 BY TEDAR   Report Status 06/15/2011 FINAL   Final    Organism ID, Bacteria STAPHYLOCOCCUS AUREUS   Final   MRSA PCR SCREENING     Status: Normal   Collection Time   06/12/11  5:46 PM      Component Value Range Status Comment   MRSA by PCR NEGATIVE  NEGATIVE  Final   ANAEROBIC CULTURE     Status: Normal (Preliminary result)   Collection Time   06/12/11   7:40 PM      Component Value Range Status Comment   Specimen Description WOUND LEFT KNEE   Final    Special Requests NONE   Final    Gram Stain     Final    Value: FEW WBC PRESENT, PREDOMINANTLY PMN     NO SQUAMOUS EPITHELIAL CELLS SEEN     MODERATE GRAM POSITIVE COCCI IN PAIRS   Culture     Final    Value: NO ANAEROBES ISOLATED; CULTURE IN PROGRESS FOR 5 DAYS   Report Status PENDING   Incomplete   WOUND CULTURE     Status: Normal   Collection Time   06/12/11  7:40 PM      Component Value Range Status Comment   Specimen Description WOUND LEFT KNEE   Final    Special Requests NONE   Final    Gram Stain     Final    Value: RARE WBC PRESENT, PREDOMINANTLY PMN     NO SQUAMOUS EPITHELIAL CELLS SEEN     FEW GRAM POSITIVE COCCI IN PAIRS   Culture     Final    Value: MODERATE STAPHYLOCOCCUS AUREUS     Note: RIFAMPIN AND GENTAMICIN SHOULD NOT BE USED AS SINGLE DRUGS FOR TREATMENT OF STAPH INFECTIONS.   Report Status 06/15/2011 FINAL   Final    Organism ID, Bacteria STAPHYLOCOCCUS AUREUS   Final   CULTURE, RESPIRATORY     Status: Normal   Collection Time   06/12/11 11:50 PM      Component Value Range Status Comment   Specimen Description SPUTUM   Final    Special Requests NONE TRACHAEL ASPPIRATE   Final    Gram Stain     Final    Value: NO WBC SEEN     NO SQUAMOUS EPITHELIAL CELLS SEEN     NO ORGANISMS SEEN   Culture     Final    Value: MODERATE STAPHYLOCOCCUS  AUREUS     Note: RIFAMPIN AND GENTAMICIN SHOULD NOT BE USED AS SINGLE DRUGS FOR TREATMENT OF STAPH INFECTIONS.   Report Status 06/16/2011 FINAL   Final    Organism ID, Bacteria STAPHYLOCOCCUS AUREUS   Final   URINE CULTURE     Status: Normal   Collection Time   06/13/11  5:47 AM      Component Value Range Status Comment   Specimen Description URINE, CATHETERIZED   Final    Special Requests NONE   Final    Culture  Setup Time [id number]   Final    Colony Count NO GROWTH   Final    Culture NO GROWTH   Final    Report Status  06/14/2011 FINAL   Final   CULTURE, BLOOD (ROUTINE X 2)     Status: Normal (Preliminary result)   Collection Time   06/14/11  4:30 PM      Component Value Range Status Comment   Specimen Description BLOOD RIGHT HAND   Final    Special Requests BOTTLES DRAWN AEROBIC AND ANAEROBIC 10CC   Final    Culture  Setup Time [id number]   Final    Culture     Final    Value:        BLOOD CULTURE RECEIVED NO GROWTH TO DATE CULTURE WILL BE HELD FOR 5 DAYS BEFORE ISSUING A FINAL NEGATIVE REPORT   Report Status PENDING   Incomplete   CULTURE, BLOOD (ROUTINE X 2)     Status: Normal (Preliminary result)   Collection Time   06/14/11  4:45 PM      Component Value Range Status Comment   Specimen Description BLOOD RIGHT ARM   Final    Special Requests BOTTLES DRAWN AEROBIC AND ANAEROBIC 10CC   Final    Culture  Setup Time [id number]   Final    Culture     Final    Value:        BLOOD CULTURE RECEIVED NO GROWTH TO DATE CULTURE WILL BE HELD FOR 5 DAYS BEFORE ISSUING A FINAL NEGATIVE REPORT   Report Status PENDING   Incomplete     Studies/Results: No results found.  Medications: I have reviewed the patient's current medications.   Patient Active Hospital Problem List:  Staphylococcus aureus bacteremia (06/14/2011) Continue with rifampin and Cefazolin day 6 of 6 weeks.  Will follow blood culture result from 3-13 , if no growth in 48 hour will consider PICC line. Also hopefully patient will have improvement in renal function.   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  Appreciate Dr Orvan Falconer help.   ARF (acute renal failure)//CKD (chronic kidney disease) stage 3, GFR 30-59 ml/min  Acute on chronic renal failure, ATN probably in setting of infection. Renal US negative for hydronephrosis.  Cr slightly decrease. Good urine output yesterday. Will increase IV fluids, Negative 2 L yesterday. . Daily  B-met. Monitor for  overload Ef 45 %,  diastolic dysfunction.   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   HYPERCHOLESTEROLEMIA  IIA (07/16/2008) Continue with statin.   ANEMIA, NORMOCYTIC, CHRONIC (07/16/2008)   Hb stable.  HYPERTENSION, UNSPECIFIED (07/16/2008) Continue with Cardizem and PRN Hydralazine.  CAD, UNSPECIFIED SITE (07/16/2008) Continue with aspirin.   Bronchitis (06/05/2011) Resolved.    Leukocytosis (06/12/2011)  Secondary to infection. Trending down.   Hyponatremia (06/12/2011) Setting renal failure. Resolved with IV fluids.  ECHO 3-13, Diastolic dysfunction and systolic.  Compensated.        LOS: 5 days   Easton Fetty M.D.  [facility] 06/17/2011, 8:14 AM

## 2011-06-17 NOTE — Progress Notes (Signed)
Patient ID: [name], male   DOB: 12/28/1940, 71 y.o.   MRN: [id number]  INFECTIOUS DISEASE PROGRESS NOTE    Date of Admission:  06/12/2011     Day  6 antibiotics   Active Problems:  Infection of prosthetic left knee joint  Staphylococcus aureus bacteremia  ARF (acute renal failure)  DM (diabetes mellitus), type 2 with complications  HYPERCHOLESTEROLEMIA  IIA  GOUT  ANEMIA, NORMOCYTIC, CHRONIC  DEPRESSION  HYPERTENSION, UNSPECIFIED  CAD, UNSPECIFIED SITE  HX, PERSONAL, MALIGNANCY, RECTUM/ANUS  Bronchitis  CKD (chronic kidney disease) stage 3, GFR 30-59 ml/min  Morbid obesity  Leukocytosis  Hyponatremia      . aspirin  81 mg Oral Daily  . ezetimibe  10 mg Oral QHS   And  . atorvastatin  20 mg Oral QHS  .  ceFAZolin (ANCEF) IV  1 g Intravenous Q12H  . diltiazem  60 mg Oral Q6H  . enoxaparin  40 mg Subcutaneous Q24H  . insulin aspart  0-20 Units Subcutaneous TID WC  . insulin aspart  0-5 Units Subcutaneous QHS  . insulin glargine  70 Units Subcutaneous QHS  . mulitivitamin with minerals  1 tablet Oral Daily  . pantoprazole  40 mg Oral Q1200  . PARoxetine  10 mg Oral Q2000  . PARoxetine  20 mg Oral QAC breakfast  . rifampin  600 mg Oral Daily    Subjective:  He denies any pain in his left knee   Objective: Temp:  [96.7 F (35.9 C)-97.5 F (36.4 C)] 96.7 F (35.9 C) (03/14 0800) Pulse Rate:  [78-94] 79  (03/14 0600) Resp:  [13-18] 14  (03/14 0600) BP: (140-192)/(63-88) 172/86 mmHg (03/14 0600) SpO2:  [96 %-99 %] 98 % (03/14 0600)  General:  He is quiet and less talkative than usual but appears to be in no distress there is one spot of dried blood on his bandage over the distal left knee incision  Lab Results Lab Results  Component Value Date   WBC 10.8* 06/16/2011   HGB 9.2* 06/16/2011   HCT 26.4* 06/16/2011   MCV 89.8 06/16/2011   PLT 154 06/16/2011    Lab Results  Component Value Date   CREATININE 4.45* 06/17/2011   BUN 74* 06/17/2011   NA 137  06/17/2011   K 4.4 06/17/2011   CL 105 06/17/2011   CO2 18* 06/17/2011    Lab Results  Component Value Date   ALT 20 06/06/2011   AST 18 06/06/2011   ALKPHOS 46 06/06/2011   BILITOT 0.4 06/06/2011       Microbiology: Recent Results (from the past 240 hour(s))  BODY FLUID CULTURE     Status: Normal   Collection Time   06/12/11 11:40 AM      Component Value Range Status Comment   Specimen Description KNEE JOINT LEFT   Final    Special Requests Normal   Final    Gram Stain     Final    Value: CYTOSPIN SLIDE WBC PRESENT,BOTH PMN AND MONONUCLEAR     ABUNDANT GRAM POSITIVE COCCI IN PAIRS     [id number]   Culture     Final    Value: ABUNDANT STAPHYLOCOCCUS AUREUS     Note: RIFAMPIN AND GENTAMICIN SHOULD NOT BE USED AS SINGLE DRUGS FOR TREATMENT OF STAPH INFECTIONS.   Report Status 06/15/2011 FINAL   Final    Organism ID, Bacteria STAPHYLOCOCCUS AUREUS   Final   BODY FLUID CULTURE     Status: Normal  Collection Time   06/12/11 12:10 PM      Component Value Range Status Comment   Specimen Description KNEE JOINT RIGHT   Final    Special Requests NONE   Final    Gram Stain     Final    Value: CYTOSPIN SLIDE WBC PRESENT, PREDOMINANTLY PMN     NO ORGANISMS SEEN   Culture NO GROWTH 3 DAYS   Final    Report Status 06/16/2011 FINAL   Final   CULTURE, BLOOD (ROUTINE X 2)     Status: Normal   Collection Time   06/12/11  3:00 PM      Component Value Range Status Comment   Specimen Description BLOOD RIGHT ARM  5 ML IN [facility] BOTTLE   Final    Special Requests NONE   Final    Culture  Setup Time [id number]   Final    Culture     Final    Value: STAPHYLOCOCCUS AUREUS     Note: RIFAMPIN AND GENTAMICIN SHOULD NOT BE USED AS SINGLE DRUGS FOR TREATMENT OF STAPH INFECTIONS. SUSCEPTIBILITIES PERFORMED ON PREVIOUS CULTURE WITHIN THE LAST 5 DAYS.     Note: Gram Stain Report Called to,Read Back By and Verified With: RN M. STOCKS ON 06/13/11 AT 2005 BY TEDAR   Report Status 06/15/2011 FINAL   Final   CULTURE,  BLOOD (ROUTINE X 2)     Status: Normal   Collection Time   06/12/11  3:10 PM      Component Value Range Status Comment   Specimen Description BLOOD LEFT ARM  3 ML IN AEROBIC ONLY   Final    Special Requests NONE   Final    Culture  Setup Time [id number]   Final    Culture     Final    Value: STAPHYLOCOCCUS AUREUS     Note: RIFAMPIN AND GENTAMICIN SHOULD NOT BE USED AS SINGLE DRUGS FOR TREATMENT OF STAPH INFECTIONS.     Note: Gram Stain Report Called to,Read Back By and Verified With: RN M. STOCKS ON 06/13/11 AT 2005 BY TEDAR   Report Status 06/15/2011 FINAL   Final    Organism ID, Bacteria STAPHYLOCOCCUS AUREUS   Final   MRSA PCR SCREENING     Status: Normal   Collection Time   06/12/11  5:46 PM      Component Value Range Status Comment   MRSA by PCR NEGATIVE  NEGATIVE  Final   ANAEROBIC CULTURE     Status: Normal (Preliminary result)   Collection Time   06/12/11  7:40 PM      Component Value Range Status Comment   Specimen Description WOUND LEFT KNEE   Final    Special Requests NONE   Final    Gram Stain     Final    Value: FEW WBC PRESENT, PREDOMINANTLY PMN     NO SQUAMOUS EPITHELIAL CELLS SEEN     MODERATE GRAM POSITIVE COCCI IN PAIRS   Culture     Final    Value: NO ANAEROBES ISOLATED; CULTURE IN PROGRESS FOR 5 DAYS   Report Status PENDING   Incomplete   WOUND CULTURE     Status: Normal   Collection Time   06/12/11  7:40 PM      Component Value Range Status Comment   Specimen Description WOUND LEFT KNEE   Final    Special Requests NONE   Final    Gram Stain     Final  Value: RARE WBC PRESENT, PREDOMINANTLY PMN     NO SQUAMOUS EPITHELIAL CELLS SEEN     FEW GRAM POSITIVE COCCI IN PAIRS   Culture     Final    Value: MODERATE STAPHYLOCOCCUS AUREUS     Note: RIFAMPIN AND GENTAMICIN SHOULD NOT BE USED AS SINGLE DRUGS FOR TREATMENT OF STAPH INFECTIONS.   Report Status 06/15/2011 FINAL   Final    Organism ID, Bacteria STAPHYLOCOCCUS AUREUS   Final   CULTURE, RESPIRATORY      Status: Normal   Collection Time   06/12/11 11:50 PM      Component Value Range Status Comment   Specimen Description SPUTUM   Final    Special Requests NONE TRACHAEL ASPPIRATE   Final    Gram Stain     Final    Value: NO WBC SEEN     NO SQUAMOUS EPITHELIAL CELLS SEEN     NO ORGANISMS SEEN   Culture     Final    Value: MODERATE STAPHYLOCOCCUS AUREUS     Note: RIFAMPIN AND GENTAMICIN SHOULD NOT BE USED AS SINGLE DRUGS FOR TREATMENT OF STAPH INFECTIONS.   Report Status 06/16/2011 FINAL   Final    Organism ID, Bacteria STAPHYLOCOCCUS AUREUS   Final   URINE CULTURE     Status: Normal   Collection Time   06/13/11  5:47 AM      Component Value Range Status Comment   Specimen Description URINE, CATHETERIZED   Final    Special Requests NONE   Final    Culture  Setup Time [id number]   Final    Colony Count NO GROWTH   Final    Culture NO GROWTH   Final    Report Status 06/14/2011 FINAL   Final   CULTURE, BLOOD (ROUTINE X 2)     Status: Normal (Preliminary result)   Collection Time   06/14/11  4:30 PM      Component Value Range Status Comment   Specimen Description BLOOD RIGHT HAND   Final    Special Requests BOTTLES DRAWN AEROBIC AND ANAEROBIC 10CC   Final    Culture  Setup Time [id number]   Final    Culture     Final    Value:        BLOOD CULTURE RECEIVED NO GROWTH TO DATE CULTURE WILL BE HELD FOR 5 DAYS BEFORE ISSUING A FINAL NEGATIVE REPORT   Report Status PENDING   Incomplete   CULTURE, BLOOD (ROUTINE X 2)     Status: Normal (Preliminary result)   Collection Time   06/14/11  4:45 PM      Component Value Range Status Comment   Specimen Description BLOOD RIGHT ARM   Final    Special Requests BOTTLES DRAWN AEROBIC AND ANAEROBIC 10CC   Final    Culture  Setup Time [id number]   Final    Culture     Final    Value:        BLOOD CULTURE RECEIVED NO GROWTH TO DATE CULTURE WILL BE HELD FOR 5 DAYS BEFORE ISSUING A FINAL NEGATIVE REPORT   Report Status PENDING   Incomplete      Studies/Results: No results found.   Assessment:  He is improving slowly on therapy for MSSA bacteremia and left prosthetic knee infection. His repeat blood cultures on March 11 are negative so he can have a new central line placed at any time but may need to avoid a PICC given his renal insufficiency.  Plan: 1.  Continue cefazolin and rifampin   Cliffton Asters, MD [facility] [phone] pager   [phone] cell 06/17/2011, 8:44 AM

## 2011-06-18 ENCOUNTER — Encounter (HOSPITAL_COMMUNITY): Payer: Self-pay | Admitting: Physician Assistant

## 2011-06-18 ENCOUNTER — Inpatient Hospital Stay (HOSPITAL_COMMUNITY): Payer: Medicare Other

## 2011-06-18 ENCOUNTER — Other Ambulatory Visit: Payer: Self-pay

## 2011-06-18 DIAGNOSIS — R7881 Bacteremia: Secondary | ICD-10-CM

## 2011-06-18 DIAGNOSIS — I2510 Atherosclerotic heart disease of native coronary artery without angina pectoris: Secondary | ICD-10-CM

## 2011-06-18 DIAGNOSIS — A4101 Sepsis due to Methicillin susceptible Staphylococcus aureus: Secondary | ICD-10-CM

## 2011-06-18 DIAGNOSIS — T8450XA Infection and inflammatory reaction due to unspecified internal joint prosthesis, initial encounter: Secondary | ICD-10-CM

## 2011-06-18 DIAGNOSIS — I4891 Unspecified atrial fibrillation: Secondary | ICD-10-CM

## 2011-06-18 DIAGNOSIS — Y831 Surgical operation with implant of artificial internal device as the cause of abnormal reaction of the patient, or of later complication, without mention of misadventure at the time of the procedure: Secondary | ICD-10-CM

## 2011-06-18 LAB — CBC
HCT: 28.1 % — ABNORMAL LOW (ref 39.0–52.0)
Hemoglobin: 9.8 g/dL — ABNORMAL LOW (ref 13.0–17.0)
MCH: 30.5 pg (ref 26.0–34.0)
MCHC: 34.9 g/dL (ref 30.0–36.0)
MCV: 87.5 fL (ref 78.0–100.0)
RBC: 3.21 MIL/uL — ABNORMAL LOW (ref 4.22–5.81)

## 2011-06-18 LAB — BASIC METABOLIC PANEL
BUN: 71 mg/dL — ABNORMAL HIGH (ref 6–23)
BUN: 68 mg/dL — ABNORMAL HIGH (ref 6–23)
CO2: 19 mEq/L (ref 19–32)
CO2: 18 mEq/L — ABNORMAL LOW (ref 19–32)
Calcium: 9.6 mg/dL (ref 8.4–10.5)
Chloride: 100 mEq/L (ref 96–112)
Creatinine, Ser: 4.04 mg/dL — ABNORMAL HIGH (ref 0.50–1.35)
GFR calc non Af Amer: 13 mL/min — ABNORMAL LOW (ref >90)
Glucose, Bld: 142 mg/dL — ABNORMAL HIGH (ref 70–99)
Glucose, Bld: 128 mg/dL — ABNORMAL HIGH (ref 70–99)
Potassium: 3.9 mEq/L (ref 3.5–5.1)
Sodium: 131 mEq/L — ABNORMAL LOW (ref 135–145)

## 2011-06-18 LAB — GLUCOSE, CAPILLARY: Glucose-Capillary: 118 mg/dL — ABNORMAL HIGH (ref 70–99)

## 2011-06-18 LAB — CARDIAC PANEL(CRET KIN+CKTOT+MB+TROPI)
CK, MB: 2.4 ng/mL (ref 0.3–4.0)
Relative Index: INVALID (ref 0.0–2.5)
Relative Index: INVALID (ref 0.0–2.5)
Relative Index: INVALID (ref 0.0–2.5)
Total CK: 22 U/L (ref 7–232)
Total CK: 19 U/L (ref 7–232)

## 2011-06-18 LAB — ANAEROBIC CULTURE

## 2011-06-18 MED ORDER — DILTIAZEM LOAD VIA INFUSION
10.0000 mg | Freq: Once | INTRAVENOUS | Status: AC
  Administered 2011-06-18: 10 mg via INTRAVENOUS
  Filled 2011-06-18: qty 10

## 2011-06-18 MED ORDER — METOPROLOL TARTRATE 1 MG/ML IV SOLN
5.0000 mg | Freq: Four times a day (QID) | INTRAVENOUS | Status: DC
  Administered 2011-06-18 (×2): 5 mg via INTRAVENOUS
  Filled 2011-06-18: qty 5

## 2011-06-18 MED ORDER — DILTIAZEM HCL 25 MG/5ML IV SOLN: 10.0000 mg | Freq: Once | INTRAVENOUS | Status: DC

## 2011-06-18 MED ORDER — DILTIAZEM HCL 100 MG IV SOLR
5.0000 mg/h | INTRAVENOUS | Status: AC
  Administered 2011-06-18: 15 mg/h via INTRAVENOUS
  Filled 2011-06-18: qty 100

## 2011-06-18 MED ORDER — METOPROLOL TARTRATE 1 MG/ML IV SOLN
INTRAVENOUS | Status: AC
  Filled 2011-06-18: qty 5

## 2011-06-18 MED ORDER — DILTIAZEM HCL 60 MG PO TABS
60.0000 mg | ORAL_TABLET | Freq: Four times a day (QID) | ORAL | Status: DC
  Administered 2011-06-18 – 2011-06-19 (×4): 60 mg via ORAL
  Filled 2011-06-18 (×8): qty 1

## 2011-06-18 MED ORDER — METOPROLOL TARTRATE 25 MG PO TABS
25.0000 mg | ORAL_TABLET | Freq: Two times a day (BID) | ORAL | Status: DC
  Administered 2011-06-18 – 2011-06-24 (×13): 25 mg via ORAL
  Filled 2011-06-18 (×14): qty 1

## 2011-06-18 MED FILL — Insulin Glargine Inj 100 Unit/ML: SUBCUTANEOUS | Qty: 0.7 | Status: AC

## 2011-06-18 NOTE — Progress Notes (Addendum)
Subjective: Denies chest pain , Dyspnea.  No complaints. Oriented to time and place.  Objective: Filed Vitals:   06/18/11 0500 06/18/11 0600 06/18/11 0700 06/18/11 0800  BP: 154/89 160/94 146/82   Pulse: 148 149 66   Temp:    97.2 F (36.2 C)  TempSrc:    Oral  Resp: 17 16 14    Height:      Weight:      SpO2: 97% 97% 97%    Weight change:   Intake/Output Summary (Last 24 hours) at 06/18/11 0810 Last data filed at 06/18/11 0700  Gross per 24 hour  Intake 3458.75 ml  Output   1775 ml  Net 1683.75 ml    General: Alert, awake, oriented x3, in no acute distress.  HEENT: No bruits, no goiter.  Heart: Regular rate and rhythm, without murmurs, rubs, gallops.  Lungs: CTA, bilateral air movement.  Abdomen: Soft, nontender, nondistended, positive bowel sounds.  Neuro: Grossly intact, nonfocal. Extremities left knee with clean dressing.   Lab Results:  Basename 06/18/11 0405 06/18/11 0313 06/16/11 0445  NA 131* 133* --  K 3.9 4.0 --  CL 100 102 --  CO2 18* 19 --  GLUCOSE 128* 142* --  BUN 68* 71* --  CREATININE 4.05* 4.04* --  CALCIUM 9.7 9.6 --  MG -- -- 2.1  PHOS -- -- 5.4*    Basename 06/18/11 0405 06/16/11 0445  WBC 9.4 10.8*  NEUTROABS -- --  HGB 9.8* 9.2*  HCT 28.1* 26.4*  MCV 87.5 89.8  PLT 178 154    Basename 06/18/11 0640  CKTOTAL 22  CKMB 2.8  CKMBINDEX --  TROPONINI <0.30    Micro Results: Recent Results (from the past 240 hour(s))  BODY FLUID CULTURE     Status: Normal   Collection Time   06/12/11 11:40 AM      Component Value Range Status Comment   Specimen Description KNEE JOINT LEFT   Final    Special Requests Normal   Final    Gram Stain     Final    Value: CYTOSPIN SLIDE WBC PRESENT,BOTH PMN AND MONONUCLEAR     ABUNDANT GRAM POSITIVE COCCI IN PAIRS     [id number]   Culture     Final    Value: ABUNDANT STAPHYLOCOCCUS AUREUS     Note: RIFAMPIN AND GENTAMICIN SHOULD NOT BE USED AS SINGLE DRUGS FOR TREATMENT OF STAPH INFECTIONS.   Report  Status 06/15/2011 FINAL   Final    Organism ID, Bacteria STAPHYLOCOCCUS AUREUS   Final   BODY FLUID CULTURE     Status: Normal   Collection Time   06/12/11 12:10 PM      Component Value Range Status Comment   Specimen Description KNEE JOINT RIGHT   Final    Special Requests NONE   Final    Gram Stain     Final    Value: CYTOSPIN SLIDE WBC PRESENT, PREDOMINANTLY PMN     NO ORGANISMS SEEN   Culture NO GROWTH 3 DAYS   Final    Report Status 06/16/2011 FINAL   Final   CULTURE, BLOOD (ROUTINE X 2)     Status: Normal   Collection Time   06/12/11  3:00 PM      Component Value Range Status Comment   Specimen Description BLOOD RIGHT ARM  5 ML IN [facility] BOTTLE   Final    Special Requests NONE   Final    Culture  Setup Time [id number]   Final  Culture     Final    Value: STAPHYLOCOCCUS AUREUS     Note: RIFAMPIN AND GENTAMICIN SHOULD NOT BE USED AS SINGLE DRUGS FOR TREATMENT OF STAPH INFECTIONS. SUSCEPTIBILITIES PERFORMED ON PREVIOUS CULTURE WITHIN THE LAST 5 DAYS.     Note: Gram Stain Report Called to,Read Back By and Verified With: RN M. STOCKS ON 06/13/11 AT 2005 BY TEDAR   Report Status 06/15/2011 FINAL   Final   CULTURE, BLOOD (ROUTINE X 2)     Status: Normal   Collection Time   06/12/11  3:10 PM      Component Value Range Status Comment   Specimen Description BLOOD LEFT ARM  3 ML IN AEROBIC ONLY   Final    Special Requests NONE   Final    Culture  Setup Time [id number]   Final    Culture     Final    Value: STAPHYLOCOCCUS AUREUS     Note: RIFAMPIN AND GENTAMICIN SHOULD NOT BE USED AS SINGLE DRUGS FOR TREATMENT OF STAPH INFECTIONS.     Note: Gram Stain Report Called to,Read Back By and Verified With: RN M. STOCKS ON 06/13/11 AT 2005 BY TEDAR   Report Status 06/15/2011 FINAL   Final    Organism ID, Bacteria STAPHYLOCOCCUS AUREUS   Final   MRSA PCR SCREENING     Status: Normal   Collection Time   06/12/11  5:46 PM      Component Value Range Status Comment   MRSA by PCR NEGATIVE  NEGATIVE   Final   ANAEROBIC CULTURE     Status: Normal (Preliminary result)   Collection Time   06/12/11  7:40 PM      Component Value Range Status Comment   Specimen Description WOUND LEFT KNEE   Final    Special Requests NONE   Final    Gram Stain     Final    Value: FEW WBC PRESENT, PREDOMINANTLY PMN     NO SQUAMOUS EPITHELIAL CELLS SEEN     MODERATE GRAM POSITIVE COCCI IN PAIRS   Culture     Final    Value: NO ANAEROBES ISOLATED; CULTURE IN PROGRESS FOR 5 DAYS   Report Status PENDING   Incomplete   WOUND CULTURE     Status: Normal   Collection Time   06/12/11  7:40 PM      Component Value Range Status Comment   Specimen Description WOUND LEFT KNEE   Final    Special Requests NONE   Final    Gram Stain     Final    Value: RARE WBC PRESENT, PREDOMINANTLY PMN     NO SQUAMOUS EPITHELIAL CELLS SEEN     FEW GRAM POSITIVE COCCI IN PAIRS   Culture     Final    Value: MODERATE STAPHYLOCOCCUS AUREUS     Note: RIFAMPIN AND GENTAMICIN SHOULD NOT BE USED AS SINGLE DRUGS FOR TREATMENT OF STAPH INFECTIONS.   Report Status 06/15/2011 FINAL   Final    Organism ID, Bacteria STAPHYLOCOCCUS AUREUS   Final   CULTURE, RESPIRATORY     Status: Normal   Collection Time   06/12/11 11:50 PM      Component Value Range Status Comment   Specimen Description SPUTUM   Final    Special Requests NONE TRACHAEL ASPPIRATE   Final    Gram Stain     Final    Value: NO WBC SEEN     NO SQUAMOUS EPITHELIAL CELLS SEEN  NO ORGANISMS SEEN   Culture     Final    Value: MODERATE STAPHYLOCOCCUS AUREUS     Note: RIFAMPIN AND GENTAMICIN SHOULD NOT BE USED AS SINGLE DRUGS FOR TREATMENT OF STAPH INFECTIONS.   Report Status 06/16/2011 FINAL   Final    Organism ID, Bacteria STAPHYLOCOCCUS AUREUS   Final   URINE CULTURE     Status: Normal   Collection Time   06/13/11  5:47 AM      Component Value Range Status Comment   Specimen Description URINE, CATHETERIZED   Final    Special Requests NONE   Final    Culture  Setup Time  [id number]   Final    Colony Count NO GROWTH   Final    Culture NO GROWTH   Final    Report Status 06/14/2011 FINAL   Final   CULTURE, BLOOD (ROUTINE X 2)     Status: Normal (Preliminary result)   Collection Time   06/14/11  4:30 PM      Component Value Range Status Comment   Specimen Description BLOOD RIGHT HAND   Final    Special Requests BOTTLES DRAWN AEROBIC AND ANAEROBIC 10CC   Final    Culture  Setup Time [id number]   Final    Culture     Final    Value:        BLOOD CULTURE RECEIVED NO GROWTH TO DATE CULTURE WILL BE HELD FOR 5 DAYS BEFORE ISSUING A FINAL NEGATIVE REPORT   Report Status PENDING   Incomplete   CULTURE, BLOOD (ROUTINE X 2)     Status: Normal (Preliminary result)   Collection Time   06/14/11  4:45 PM      Component Value Range Status Comment   Specimen Description BLOOD RIGHT ARM   Final    Special Requests BOTTLES DRAWN AEROBIC AND ANAEROBIC 10CC   Final    Culture  Setup Time [id number]   Final    Culture     Final    Value:        BLOOD CULTURE RECEIVED NO GROWTH TO DATE CULTURE WILL BE HELD FOR 5 DAYS BEFORE ISSUING A FINAL NEGATIVE REPORT   Report Status PENDING   Incomplete     Studies/Results: No results found.  Medications: I have reviewed the patient's current medications.  Staphylococcus aureus bacteremia (06/14/2011)  Continue with rifampin and Cefazolin day 7 of 6 weeks.  Will follow blood culture result from 3-13 , if no growth in 48 hour will consider PICC line. Also hopefully patient will have improvement in renal function. Will hold on PICC line, until more stable from HR.   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  Appreciate Dr Orvan Falconer help.  ARF (acute renal failure)//CKD (chronic kidney disease) stage 3, GFR 30-59 ml/min  Acute on chronic renal failure, ATN probably in setting of infection. Renal US negative for hydronephrosis.  Cr slightly decrease. Good urine output  yesterday. Will decrease IV fluids. Daily B-met. Monitor for overload Ef 45 %, diastolic dysfunction.   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   HYPERCHOLESTEROLEMIA IIA (07/16/2008) Continue with statin.   ANEMIA, NORMOCYTIC, CHRONIC (07/16/2008) Hb stable.  HYPERTENSION, UNSPECIFIED (07/16/2008) Continue with Cardizem and PRN Hydralazine.   CAD, UNSPECIFIED SITE (07/16/2008) Continue with aspirin.   Bronchitis (06/05/2011)  Resolved.   Leukocytosis (06/12/2011) Secondary to infection. Trending down.  Hyponatremia (06/12/2011)  Setting renal failure. Stable.  ECHO 3-13, Diastolic dysfunction and systolic.  Compensated.   Tachycardia// A fib: I will transition IV Cardizem to PO. Continue with IV metoprolol. Check Chest x ray to evaluate Central line placement. If further episode will ask cardiologist evaluation. Patient has been on DVT prophylaxis.   Encephalopathy: Confusion, fluctuate. CT head ordered. ? Medications. Less likely uremia, renal function improving.         LOS: 6 days   Altagracia Rone M.D.  [facility] 06/18/2011, 8:10 AM

## 2011-06-18 NOTE — Progress Notes (Signed)
PT Cancellation Note  Treatment cancelled today due to medical issues with patient which prohibited therapy.  RN stated to hold PT today due to pt with increased HR to 150s while transferring for CT.    [facility] 06/18/2011, 1:58 PM

## 2011-06-18 NOTE — Progress Notes (Signed)
Patient ID: [name], male   DOB: 01/10/1941, 71 y.o.   MRN: [id number] PATIENT ID: EH SESAY        MRN:  [id number]          DOB/AGE: 1940-11-13 / 71 y.o.  Logan Campbell, MD   Logan Code, PA-C [address] [place], [place], [place]  [address]                             [phone]   PROGRESS NOTE  Subjective:  negative for Chest Pain  negative for Shortness of Breath  negative for Nausea/Vomiting   negative for Calf Pain  positive for Bowel Movement   Tolerating Diet: yes         Patient reports pain as mild.    Objective: Vital signs in last 24 hours:   Patient Vitals for the past 24 hrs:  BP Temp Temp src Pulse Resp SpO2  06/18/11 0500 154/89 mmHg - - 148  17  97 %  06/18/11 0300 159/82 mmHg - - 85  16  97 %  06/18/11 0200 171/78 mmHg - - 85  16  97 %  06/18/11 0100 161/74 mmHg - - 87  13  97 %  06/18/11 0000 161/72 mmHg 98.2 F (36.8 C) Oral 87  15  97 %  06/17/11 2240 166/76 mmHg - - 88  17  99 %  06/17/11 2200 - - - 87  18  99 %  06/17/11 2000 - 98 F (36.7 C) Oral 88  19  99 %  06/17/11 1800 - - - 86  19  99 %  06/17/11 1600 - 97.5 F (36.4 C) Oral 82  18  99 %  06/17/11 1400 - - - 86  19  99 %  06/17/11 1200 171/89 mmHg - - 87  17  99 %  06/17/11 1000 154/115 mmHg - - 84  15  99 %      Intake/Output from previous day:   03/14 0701 - 03/15 0700 In: 2958.8 [P.O.:540; I.V.:2318.8] Out: 1775 [Urine:1775]   Intake/Output this shift:       Intake/Output      03/14 0701 - 03/15 0700 03/15 0701 - 03/16 0700   P.O. 540    I.V. (mL/kg) 2318.8 (18.4)    IV Piggyback 100    Total Intake(mL/kg) 2958.8 (23.5)    Urine (mL/kg/hr) 1775 (0.6)    Total Output 1775    Net +1183.8            LABORATORY DATA:  Basename 06/18/11 0405 06/16/11 0445 06/15/11 0500 06/14/11 0340 06/13/11 0340 06/12/11 1120  WBC 9.4 10.8* 14.0* 15.0* 17.9* 17.7*  HGB 9.8* 9.2* 9.1* 9.7* 10.5* 12.1*  HCT 28.1* 26.4* 26.4* 28.2* 30.1* 34.7*  PLT 178 154 129* 125* 135*  157    Basename 06/18/11 0405 06/18/11 0313 06/17/11 0517 06/16/11 0445 06/15/11 0500 06/14/11 0340 06/13/11 0340  NA 131* 133* 137 132* 131* 128* 129*  K 3.9 4.0 4.4 4.6 4.7 4.6 5.0  CL 100 102 105 102 100 96 97  CO2 18* 19 18* 18* 20 20 24   BUN 68* 71* 74* 72* 73* 59* 45*  CREATININE 4.05* 4.04* 4.45* 4.90* 5.06* 4.32* 2.73*  GLUCOSE 128* 142* 215* 200* 240* 192* 215*  CALCIUM 9.7 9.6 9.9 10.1 9.3 8.3* 8.2*   Lab Results  Component Value Date   INR 1.19 06/12/2011   INR  1.99* 01/28/2009   INR 1.87* 01/27/2009    Examination:  General appearance: alert, fatigued, mild distress and slowed mentation  Wound Exam: clean, dry, intact   Drainage:  None: wound tissue dry-no active drainage  Motor Exam: Opposition, EHL, FHL, Anterior Tibial and Posterior Tibial Intact  Sensory Exam: Superficial Peroneal and Deep Peroneal normal  Vascular Exam: Normal  Assessment:    6 Days Post-Op  Procedure(s) (LRB): IRRIGATION AND DEBRIDEMENT KNEE WITH POLY EXCHANGE (Left)  ADDITIONAL DIAGNOSIS:  Active Problems:  DM (diabetes mellitus), type 2 with complications  HYPERCHOLESTEROLEMIA  IIA  GOUT  ANEMIA, NORMOCYTIC, CHRONIC  DEPRESSION  HYPERTENSION, UNSPECIFIED  CAD, UNSPECIFIED SITE  HX, PERSONAL, MALIGNANCY, RECTUM/ANUS  Bronchitis  Infection of prosthetic left knee joint  ARF (acute renal failure)  CKD (chronic kidney disease) stage 3, GFR 30-59 ml/min  Morbid obesity  Leukocytosis  Hyponatremia  Staphylococcus aureus bacteremia  Acute Blood Loss Anemia   Plan: Physical Therapy as ordered Weight Bearing as Tolerated (WBAT)  DVT Prophylaxis:  Lovenox  DISCHARGE PLAN: home vs skilled nursing  DISCHARGE NEEDS: CPM, Walker, 3-in-1 comode seat and IV Antibiotics   right knee cultures negative.Treated via ID for MSSA left knee. Now with CPM and PT.Will follow at intervals,stable from ortho standpoint      Shanayah Kaffenberger W 06/18/2011, 8:03 AM

## 2011-06-18 NOTE — Progress Notes (Signed)
Patient went into aflutter- rate in 150's- at 1000 while in CT scan. Cardizem resumed and titrated to 15mg /hr for heart rate remaining in 150's. At 1135, after receiving lopressor, heart rate decreased to 70's and patient converted to sinus rhythm. Cardizem titrated off. Patient tolerated well, maintained stable blood pressure. Heart rate remained in 70's throughout the rest of the shift, remaining in sinus rhythm and cardizem remained off.

## 2011-06-18 NOTE — Consult Note (Signed)
Cardiology Consult Note   Patient ID: [name] MRN: [id number], DOB/AGE: [birth year]   Admit date: 06/12/2011 Date of Consult: 06/18/2011  Primary Physician: Ailene Ravel, MD, MD Primary Cardiologist: Julien Nordmann, MD  Pt. Profile: [name] is a 71yo male with PMHx significant for CAD (unspecified PTCA x 2 in 1992), ICM (echo 03/13: LVEF 40-45%, grade 2 diastolic dysfunction, inferior and posterior wall hypokinesis, technically poor study), type 2 DM, HTN, HL, CKD (stage III), colorectal cancer and ulcerative colitis (s/p coloproctectomy with subsequent XRT and chemo in 2007, now with colostomy bag) and history of artificial L knee replacement in 2009 admitted to [facility] hospital on 06/12/11 with MSSA bacteremia sourced from a septic L artificial knee joint. Of note, he had recently been discharged from the hospital for chest pain diagnosed as an acute bronchitis. He was discharged with a steroid taper.   Reason for consult: management of sinus tachycardia/rapid atrial flutter  Problem List: Past Medical History  Diagnosis Date  . Gout   . Depression   . Diabetes mellitus   . Unspecified essential hypertension   . Hypercholesteremia     IIA  . CAD (coronary artery disease)   . Normocytic anemia     Chronic  . Colitis, ulcerative     NOS  . Personal hx-rectal/anal malignancy   . Urinary incontinence   . Benign prostatic hypertrophy   . H/O ETOH abuse 06/06/2011  . 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 History of left knee surgery   . Pleurisy   . Cancer 2006    bladder    Past Surgical History  Procedure Date  . Colon resection   . Transurethral resection of prostate   . Abdominal aortic aneurysm repair 1992     Allergies:  Allergies  Allergen Reactions  . Fentanyl   . Hydromorphone Hcl   . Lorazepam   . Oxycodone-Acetaminophen     HPI:   He states that a day after discharge, his knee pain increasingly worsened  causing him to present to his PCP's office. His L knee was aspirated at that time. He also endorsed L ankle pain and developed R knee pain that evening. On 03/09, EMS was called to home due to increasing bilateral knee pain, swelling, ankle pain and shortness of breath. Upon arrival, ESR was elevated and the patient was noted to have a leukocytosis, mild hyponatremia and renal insufficiency. The L knee was aspirated once more revealing 95 cc of purulent fluid. R knee with 20 cc normal appearing synovial fluid upon aspiration. He was started on empiric Rocephin and Vanc. Cultures were drawn revealing MSSA. He was subsequently admitted by [facility] hospitalists. L ankle radiograph also revealing fracture of the fibular plate and lowest 2 screws indicating refractur or nonunion of the bone. Ortho was consulted, and the patient underwent I&D and poly exchange.   He remained intubated post-op due to low O2 sat's and transferred to ICU. That evening, he was found to have developed a fib with RVR. BP stable and Cardizem gtt started with good rate-control. 2D echo was ordered with the above findings. Cardizem was switched to PO. HR once again increased to 140 bpm and the patient was given a one-time dose of Cardizem IV with conversion to NSR, but later developed sinus tachycardia. Albuterol was replaced with Xopenex and nicotine patch was held. He again became tachycardic with HR in the 150s, Cardizem IV was once again given with appropriate rate-reduction.  Currently, he is in NSR at 70 bpm. There is evidence of atrial flutter on telemetry (see tele notes) earlier today. He seems confused answering questions inappropriately at first, but he is A&O x 3 after multiple attempts. Per nurse, she states that his daughter reported that he becomes delirious when hospitalized. He denies chest pain, palpitations, shortness of breath, lightheadedness, diaphoresis, orthopnea and PND. CEs neg x 1. He is currently on Lopressor 5mg  IV  q6hr, Cardizem PO 60mg  q6hr and Cardizem 5mg  IV.    Home Medications: Prior to Admission medications   Medication Sig Start Date End Date Taking? Authorizing Provider  aspirin 81 MG tablet Take 81 mg by mouth daily.     Yes Historical Provider, MD  CINNAMON PO Take 1,000 mg by mouth daily.   Yes Historical Provider, MD  exenatide (BYETTA) 5 MCG/0.02ML SOLN Inject 5 mcg into the skin daily.   Yes Historical Provider, MD  ezetimibe-simvastatin (VYTORIN) 10-40 MG per tablet Take 1 tablet by mouth at bedtime.   Yes Historical Provider, MD  insulin aspart (NOVOLOG) 100 UNIT/ML injection Inject 16-18 Units into the skin 3 (three) times daily before meals. Breakfast 16 units; Lunch 16-18 units;  Dinner 18 units   Yes Historical Provider, MD  insulin glargine (LANTUS) 100 UNIT/ML injection Inject 70 Units into the skin at bedtime. 06/08/11  Yes Christina P Rama, MD  losartan (COZAAR) 100 MG tablet Take 100 mg by mouth daily.   Yes Historical Provider, MD  Multiple Vitamin (MULTIVITAMIN) tablet Take 1 tablet by mouth daily.     Yes Historical Provider, MD  naproxen sodium (ANAPROX) 220 MG tablet Take 220 mg by mouth daily.    Yes Historical Provider, MD  omega-3 acid ethyl esters (LOVAZA) 1 G capsule Take 4 g by mouth daily.   Yes Historical Provider, MD  PARoxetine (PAXIL) 20 MG tablet Take 10-20 mg by mouth 2 (two) times daily. 1 tab in am, 0.5 tab in pm   Yes Historical Provider, MD  predniSONE (STERAPRED UNI-PAK) 10 MG tablet Take 6 tabs 06/09/11, decrease by 10 mg daily until off. 06/08/11 06/18/11 Yes Maryruth Bun Rama, MD    Inpatient Medications:     . aspirin  81 mg Oral Daily  . ezetimibe  10 mg Oral QHS   And  . atorvastatin  20 mg Oral QHS  .  ceFAZolin (ANCEF) IV  1 g Intravenous Q12H  . diltiazem  10 mg Intravenous Once  . diltiazem  60 mg Oral Q6H  . enoxaparin  40 mg Subcutaneous Q24H  . insulin aspart  0-20 Units Subcutaneous TID WC  . insulin aspart  0-5 Units Subcutaneous QHS  .  insulin glargine  70 Units Subcutaneous QHS  . metoprolol      . metoprolol  5 mg Intravenous Q6H  . mulitivitamin with minerals  1 tablet Oral Daily  . pantoprazole  40 mg Oral Q1200  . PARoxetine  10 mg Oral Q2000  . PARoxetine  20 mg Oral QAC breakfast  . rifampin  600 mg Oral Daily  . DISCONTD: diltiazem  10 mg Intravenous Once  . DISCONTD: diltiazem  60 mg Oral Q6H   Prescriptions prior to admission  Medication Sig Dispense Refill  . aspirin 81 MG tablet Take 81 mg by mouth daily.        . benzonatate (TESSALON) 100 MG capsule Take 1 capsule (100 mg total) by mouth 3 (three) times daily as needed for cough.  30 capsule  1  .  CINNAMON PO Take 1,000 mg by mouth daily.      Marland Kitchen exenatide (BYETTA) 5 MCG/0.02ML SOLN Inject 5 mcg into the skin daily.      Marland Kitchen ezetimibe-simvastatin (VYTORIN) 10-40 MG per tablet Take 1 tablet by mouth at bedtime.      . insulin aspart (NOVOLOG) 100 UNIT/ML injection Inject 16-18 Units into the skin 3 (three) times daily before meals. Breakfast 16 units; Lunch 16-18 units;  Dinner 18 units      . insulin glargine (LANTUS) 100 UNIT/ML injection Inject 70 Units into the skin at bedtime.  10 mL    . losartan (COZAAR) 100 MG tablet Take 100 mg by mouth daily.      . Multiple Vitamin (MULTIVITAMIN) tablet Take 1 tablet by mouth daily.        . naproxen sodium (ANAPROX) 220 MG tablet Take 220 mg by mouth daily.       Marland Kitchen omega-3 acid ethyl esters (LOVAZA) 1 G capsule Take 4 g by mouth daily.      Marland Kitchen PARoxetine (PAXIL) 20 MG tablet Take 10-20 mg by mouth 2 (two) times daily. 1 tab in am, 0.5 tab in pm      . predniSONE (STERAPRED UNI-PAK) 10 MG tablet Take 6 tabs 06/09/11, decrease by 10 mg daily until off.  21 tablet  0    Family History  Problem Relation Age of Onset  . Dementia Mother   . COPD Father      History   Social History  . Marital Status: Married    Spouse Name: N/A    Number of Children: N/A  . Years of Education: N/A   Occupational History  .  Full time    Social History Main Topics  . Smoking status: Former Smoker -- 2.0 packs/day for 15 years    Types: Cigarettes    Quit date: 06/04/1970  . Smokeless tobacco: Current User    Types: Chew  . Alcohol Use: No  . Drug Use: Not on file  . Sexually Active: Not Currently    Birth Control/ Protection: None   Other Topics Concern  . Not on file   Social History Narrative   Married     Review of Systems: General: positive for chills, negative for fever, night sweats or weight changes.  Cardiovascular: positive for palpitations, negative for chest pain, dyspnea on exertion, edema, orthopnea, paroxysmal nocturnal dyspnea or shortness of breath Dermatological: negative for rash Respiratory: positive for cough, negative for wheezing Urologic: negative for hematuria Abdominal: negative for nausea, vomiting, diarrhea, bright red blood per rectum, melena, or hematemesis Neurologic: negative for visual changes, syncope, or dizziness All other systems reviewed and are otherwise negative except as noted above.  Physical Exam: Blood pressure 165/92, pulse 145, temperature 97.2 F (36.2 C), temperature source Oral, resp. rate 15, height 6' (1.829 m), weight 126.1 kg (278 lb), SpO2 97.00%.    General: Elderly, obese, appears confused, in no acute distress. Head: Normocephalic, atraumatic, sclera non-icteric, no xanthomas, nares are without discharge. Neck: Negative for carotid bruits. JVD not elevated. Lungs: Clear bilaterally to auscultation without wheezes, rales, or rhonchi. Breathing is unlabored. Heart: RRR with S1 S2. No murmurs, rubs, or gallops appreciated. Abdomen:  Distended due to central adiposity, non-tender with normoactive bowel sounds, colostomy bag noted. No obvious abdominal masses. Msk:  Strength and tone appears normal for age. Extremities: No clubbing, cyanosis or edema.  Distal pedal pulses are 2+ and equal bilaterally. Neuro: Alert and oriented X 3 (  see below),   Moves all extremities spontaneously. Psych: Responds to questions inappropriately at first, requiring multiple attempts.appropriately with a normal affect.  Labs: Recent Labs  [facility] 06/18/11 0405 06/16/11 0445   WBC 9.4 10.8*   HGB 9.8* 9.2*   HCT 28.1* 26.4*   MCV 87.5 89.8   PLT 178 154   Lab 06/18/11 0405 06/18/11 0313 06/17/11 0517  NA 131* 133* 137  K 3.9 4.0 4.4  CL 100 102 105  CO2 18* 19 18*  BUN 68* 71* 74*  CREATININE 4.05* 4.04* 4.45*  CALCIUM 9.7 9.6 9.9  PROT -- -- --  BILITOT -- -- --  ALKPHOS -- -- --  ALT -- -- --  AST -- -- --  AMYLASE -- -- --  LIPASE -- -- --  GLUCOSE 128* 142* 215*    Recent Labs  Basename 06/18/11 0640   CKTOTAL 22   CKMB 2.8   CKMBINDEX --   TROPONINI <0.30    Radiology/Studies: Dg Chest 1 View  06/18/2011  *RADIOLOGY REPORT*  Clinical Data: Central line placement.  CHEST - 1 VIEW  Comparison: 06/15/2011  Findings: Left jugular vein catheter tip is in the superior vena cava at the level of the azygos vein. No pneumothorax.  Heart size and vascularity are normal and the lungs are clear.  No osseous abnormality.  The patient has a narrowed transverse diameter of the trachea at the level of the thoracic inlet but this appears unchanged since the prior PET CT dated 07/02/2008.  There are no masses compressing the trachea.  IMPRESSION: No acute abnormalities.  The tip of the central line is in the superior vena cava directed toward the side wall.  Original Report Authenticated By: Gwynn Burly, M.D.   Dg Chest 2 View  06/12/2011  *RADIOLOGY REPORT*  Clinical Data:  Shortness of breath, cough and chest pain.  CHEST - 2 VIEW  Comparison: 06/04/2011  Findings: The heart size and mediastinal contours are within normal limits.  Both lungs are clear.  The visualized skeletal structures are unremarkable.  IMPRESSION: No active disease.  Original Report Authenticated By: Reola Calkins, M.D.   Dg Chest 2 View  06/04/2011  *RADIOLOGY  REPORT*  Clinical Data: Chest pain, shortness of breath, diabetes.  History of coronary artery disease.  CHEST - 2 VIEW  Comparison: 01/23/2009  Findings: Heart size is accentuated by the AP position of the patient.  Lungs are clear.  No edema. Visualized osseous structures have a normal appearance.  IMPRESSION: Negative exam.  Original Report Authenticated By: Patterson Hammersmith, M.D.   Dg Ankle Complete Left  06/12/2011  *RADIOLOGY REPORT*  Clinical Data: Pain and swelling.  Previous surgery.  LEFT ANKLE COMPLETE - 3+ VIEW  Comparison: 08/17/2003  Findings: There has been fracture of the fibular plate to.  There are also fractures of the lowest two screws.  There is refracture or nonunion.  There is degenerative disease of the ankle joint with osteophytes, joint effusion and small intra-articular loose bodies.  Talar dome appears smooth.  IMPRESSION: Fracture of the fibular plate and lowest two screws.  Refracture or nonunion of the bone.  Degenerative disease of the ankle joint with osteophyte formation, joint effusion and probably small intra-articular loose bodies.  Original Report Authenticated By: Thomasenia Sales, M.D.   Ct Head Wo Contrast  06/18/2011  *RADIOLOGY REPORT*  Clinical Data: Altered mental status.  Confusion.  CT HEAD WITHOUT CONTRAST  Technique:  Contiguous axial images were obtained  from the base of the skull through the vertex without contrast.  Comparison: MRI of the brain dated 04/22/2007.  Findings: There is a well-defined focus of decreased attenuation immediately lateral to the head of the left caudate nucleus extending into the region of the left external capsule, similar to an area of 02/02 hyperintensity on remote MRI of the brain dated 04/22/2007, indicative of the sequelae of old lacunar infarction. No definite acute intracranial abnormalities.  Specifically, no definite signs of acute/subacute ischemia, focal mass, mass effect, hydrocephalus or abnormal intra or extra-axial  fluid collections. There are extensive regions of decreased attenuation throughout the deep and periventricular white matter of the cerebral hemispheres bilaterally, most consistent with chronic microvascular ischemic changes.  No displaced skull fractures are identified.  The mastoids are largely sclerosis bilaterally, and visualized portions of the paranasal sinuses are remarkable for areas of mild mucosal thickening throughout the ethmoids, with a small air-fluid level in the right maxillary sinus.  IMPRESSION: 1.  No definite acute intracranial abnormalities (please note that accurate assessment for subtle regions acute/subacute ischemia upon this background of chronic microvascular ischemic changes in the brain is exceedingly difficult). 2.  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 3.  Mild mucosal thickening in the ethmoid sinuses and small air fluid level in the right maxillary sinus.  Clinical correlation for signs of symptoms of acute sinusitis is recommended.  Original Report Authenticated By: Florencia Reasons, M.D.   US Renal  06/14/2011  *RADIOLOGY REPORT*  Clinical Data: Elevated creatinine.  RENAL/URINARY TRACT ULTRASOUND COMPLETE  Comparison:  MRI 07/30/2008, PET CT 03/18/2009  Findings:  Right Kidney:  10.0 cm.  No mass or hydronephrosis.  Left Kidney:  12.1 cm.  No mass or hydronephrosis.  Bladder:  The bladder is not visualized.  IMPRESSION: Normal-appearing kidneys.  Non visualized bladder.  Original Report Authenticated By: Patterson Hammersmith, M.D.   Dg Chest Port 1 View  06/15/2011  *RADIOLOGY REPORT*  Clinical Data: Extubation.History of diabetes. Hypercholesterolemia.  Depression.  Coronary artery disease.  PORTABLE CHEST - 1 VIEW  Comparison: 06/14/2011  Findings: Left IJ central line unchanged, with tip at high to mid  SVC.  Normal heart size.  Cannot exclude small bilateral pleural effusions. No pneumothorax.  Mild pulmonary interstitial thickening is chronic and nonspecific.  Mild bibasilar airspace disease is new.  IMPRESSION:  1.  Decreased lung volumes with development of bibasilar airspace disease.  Most likely atelectasis. 2.  Cannot exclude small bilateral pleural effusions.  Original Report Authenticated By: Consuello Bossier, M.D.   Dg Chest Port 1 View  06/14/2011  *RADIOLOGY REPORT*  Clinical Data: Left IJ central line placement  PORTABLE CHEST - 1 VIEW  Comparison: 06/13/2011  Findings: 1358 hours.  Lung volumes are low. The lungs are clear without focal infiltrate, edema, pneumothorax or pleural effusion. Cardiopericardial silhouette is at upper limits of normal for size. Endotracheal tube has been removed in the interval.  A new left IJ central line tip projects at the proximal SVC level, just beyond the innominate vein confluence.  No evidence for pneumothorax. Telemetry leads overlie the chest.  IMPRESSION: Left IJ central line tip projects at the proximal SVC level.  No evidence for pneumothorax.  Original Report Authenticated By: ERIC A. MANSELL, M.D.   Portable Chest Xray In Am  06/13/2011  *RADIOLOGY REPORT*  Clinical Data: 71 year old male with  respiratory failure and on ventilator.  PORTABLE CHEST - 1 VIEW  Comparison: 06/12/2011 and prior chest radiographs  Findings: An endotracheal tube with tip 6.2 cm above the carina is again identified and appears satisfactorily positioned. Continued atelectasis/airspace disease throughout the left lung again noted. There is no evidence of pneumothorax. There is been little interval change since the prior study.  IMPRESSION: Stable chest radiograph with continued left lung airspace opacities and stable endotracheal tube.  Original Report Authenticated By: Rosendo Gros, M.D.   Dg Chest Port 1 View  06/13/2011  *RADIOLOGY REPORT*  Clinical Data: Repositioned  endotracheal tube.  PORTABLE CHEST - 1 VIEW  Comparison: Earlier today.  Findings: The endotracheal tube has been advanced and is in satisfactory position.  Progressive left lung airspace opacity, especially in the left lower lobe, where dense opacity is currently demonstrated.  Interval small amount of linear density at the right lung base.  Normal sized heart.  IMPRESSION:  1.  Satisfactory position of the endotracheal tube. 2.  Progressive left lung airspace opacity, especially in the left lower lobe, suspicious for pneumonia. 3.  Interval minimal right basilar atelectasis.  Original Report Authenticated By: Darrol Angel, M.D.   Dg Chest Port 1 View  06/12/2011  *RADIOLOGY REPORT*  Clinical Data: Endotracheal tube repositioned.  PORTABLE CHEST - 1 VIEW  Comparison: Earlier today.  Findings: The endotracheal tube has been retracted with its tip at the thoracic inlet.  Significantly improved aeration of the left lung with residual patchy airspace opacity.  Clear right lung. Normal sized heart.  Improved mediastinal shift to the left.  IMPRESSION:  1.  Endotracheal tube tip at the thoracic inlet.  It is recommended that this be advanced 4 cm. 2.  Partial re-expansion of the left lung with residual pneumonia or re-expansion pulmonary edema.  Original Report Authenticated By: Darrol Angel, M.D.   Dg Chest Port 1 View  06/12/2011  *RADIOLOGY REPORT*  Clinical Data: Intubated.  PORTABLE CHEST - 1 VIEW  Comparison: 06/12/2011.  Findings: Interval endotracheal tube with its tip in the right mainstem bronchus.  Interval complete opacification of the left hemithorax.  Clear right lung.  Mediastinal shift to the left. Unremarkable bones.  IMPRESSION: Endotracheal tube tip in the right mainstem bronchus with associated complete collapse of the left lung.  It is recommended that this be retracted 7 cm.  These results were called by telephone on 06/12/2011  at  2215 hours to  [facility], the patient's nurse, who verbally  acknowledged these results.  Original Report Authenticated By: Darrol Angel, M.D.   Dg Knee Complete 4 Views Left  06/12/2011  *RADIOLOGY REPORT*  Clinical Data: Pain and swelling.  Fluid aspiration.  LEFT KNEE - COMPLETE 4+ VIEW  Comparison: None.  Findings: There is a joint effusion.  A few dots of a air are notable in the suprapatellar region, presumably related to the recent aspiration.  Components of the total knee replacement appear unremarkable.  IMPRESSION: Previous total knee replacement.  Joint effusion.  A few dots of a air in the suprapatellar region presumably related to the recent aspiration.  Original Report Authenticated By: Thomasenia Sales, M.D.   Dg Knee Complete 4 Views Right  06/12/2011  *RADIOLOGY REPORT*  Clinical Data: Pain.  Fluid aspiration.  RIGHT KNEE - COMPLETE 4+ VIEW  Comparison: None.  Findings: There is advanced tricompartmental osteoarthritis with joint space narrowing and osteophyte formation.  There is a small amount of joint fluid.  No visible  intra-articular loose body.  No fracture or other focal lesion.  Arterial calcification is noted.  IMPRESSION: Advanced tricompartmental osteoarthritis.  Small joint effusion.  Original Report Authenticated By: Thomasenia Sales, M.D.   EKG: 06/12/11: sinus tachycardia 100 bpm, multiple PVCs, no ST-T wave changes Tele: atrial flutter, 2:1/3:1 at 120-150 bpm, trace PVCs  ASSESSMENT AND PLAN:   1. Atrial flutter- likely driven by underlying MSSA bacteremia and post-op status. I do note see a history of arrhythmia in the past. Noted on telemetry over the last 24 hours with 2/3:1 with rates 120-150. Currently in NSR. He reports experiencing palpitations prior to this admission. I am unsure as to the reliability of this complaint given the patient's altered mental status; however, this would be consistent with an arrhythmia. No recent alcohol intake per RN note on 03/12. He is currently in NSR and asymptomatic. As this is likely in the  setting of an underlying bacteremia, hope for this to resolve upon resolution of the infection and recovery from surgery. In the meantime, would prefer to keep the patient rate-controlled as he also seems to convert to NSR.   - Will switch to Lopressor PO  - Continue Cardizem PO and gtt for rate-control  - Should he develop atrial flutter, would start Cardizem gtt for further rate-control  - Would continue DVT prophylaxis for anticoagulation   2. CAD- stable; patient without chest pain. Denies ischemic symptoms prior to this admission. Was admitted recently for chest pain, TnI and EKG negative, and diagnosed with bronchitis. This admission, CEs neg x 1. EKG on 03/09 without evidence of ischemia.   - Continue to cycle CEs  - Continue ASA/BB/statin  3. ICM- EF 40-45% with grade 2 diastolic dysfunction noted on echo this admission. Patient denies shortness of breath, orthopnea, PND prior to or during this admission. Euvolemic on exam. CXR on 03/15 without acute abnormalities.   - No ACEi/ARB in the setting of acute on CKD  - Continue BB  - Would add diuretic should he become volume overloaded   4. HTN- SBP 140-160s currently  - Will monitor with adjustments in rate-control  - Agree with Hydralazine PRN  5. Anemia- normocytic; no evidence of active bleeding. Suspected secondary to CKD, chronic DM, hypovolemia. May be contributing to atrial flutter. Management per primary team.   6. Hyperlipidemia  - Continue statin   Signed, R. Hurman Horn, PA-C 06/18/2011, 12:52 PM  Patient seen and examined and history reviewed. Agree with above findings and plan. Pleasant 71 yo WM admitted with septic knee with MSSA. Prior history of ischemic heart disease with remote stent. EF of 40-45%. Post operatively has developed paroxysmal atrial flutter with RVR. He is aware of tachycardia and experiences some chest tightness. Exam reveals clear lung fields. No gallop or murmur. He has left leg edema. Now he is  in NSR on IV cardizem 5 mg/hr. I think his arrythmia is due to multiple stressors. No prior history of atrial arrythmias. Will focus on rate control. Will add oral beta blocker and titrate as HR and BP allow. Will continue IV Cardizem for now but if rhythm improves will stop in am. I would not fully anticoagulate now given recent surgery and I expect atrial flutter to resolve with improvement in his infection. Will check TFTs   Thedora Hinders 06/18/2011 3:24 PM

## 2011-06-18 NOTE — Progress Notes (Signed)
Patient ID: [name], male   DOB: 16-Feb-1941, 71 y.o.   MRN: [id number]  INFECTIOUS DISEASE PROGRESS NOTE    Date of Admission:  06/12/2011           Day 7 antibiotics Active Problems:  Infection of prosthetic left knee joint  Staphylococcus aureus bacteremia  ARF (acute renal failure)  DM (diabetes mellitus), type 2 with complications  HYPERCHOLESTEROLEMIA  IIA  GOUT  ANEMIA, NORMOCYTIC, CHRONIC  DEPRESSION  HYPERTENSION, UNSPECIFIED  CAD, UNSPECIFIED SITE  HX, PERSONAL, MALIGNANCY, RECTUM/ANUS  Bronchitis  CKD (chronic kidney disease) stage 3, GFR 30-59 ml/min  Morbid obesity  Leukocytosis  Hyponatremia  Atrial flutter, paroxysmal      . aspirin  81 mg Oral Daily  . ezetimibe  10 mg Oral QHS   And  . atorvastatin  20 mg Oral QHS  .  ceFAZolin (ANCEF) IV  1 g Intravenous Q12H  . diltiazem  10 mg Intravenous Once  . diltiazem  60 mg Oral Q6H  . enoxaparin  40 mg Subcutaneous Q24H  . insulin aspart  0-20 Units Subcutaneous TID WC  . insulin aspart  0-5 Units Subcutaneous QHS  . insulin glargine  70 Units Subcutaneous QHS  . metoprolol      . metoprolol tartrate  25 mg Oral BID  . mulitivitamin with minerals  1 tablet Oral Daily  . pantoprazole  40 mg Oral Q1200  . PARoxetine  10 mg Oral Q2000  . PARoxetine  20 mg Oral QAC breakfast  . rifampin  600 mg Oral Daily  . DISCONTD: diltiazem  10 mg Intravenous Once  . DISCONTD: diltiazem  60 mg Oral Q6H  . DISCONTD: metoprolol  5 mg Intravenous Q6H    Subjective: He is alert but slightly confused  Objective: Temp:  [97.2 F (36.2 C)-98.4 F (36.9 C)] 97.7 F (36.5 C) (03/15 1200) Pulse Rate:  [66-149] 78  (03/15 1621) Resp:  [13-19] 15  (03/15 1000) BP: (146-171)/(72-94) 156/73 mmHg (03/15 1621) SpO2:  [97 %-99 %] 97 % (03/15 1000)  He continues to have a Band-Aid dressing over his left knee incision  Assessment: Remains on Ancef and rifampin for an MSSA prosthetic knee infection  Plan: 1. Continue  current antibiotics 2. Please call me for any infectious disease questions this weekend   Cliffton Asters, MD [facility] [phone] pager   [phone] cell 06/18/2011, 4:55 PM

## 2011-06-18 NOTE — Progress Notes (Signed)
eLink Physician-Brief Progress Note Patient Name: [name] DOB: 13-Nov-1940 MRN: [id number]  Date of Service  06/18/2011   HPI/Events of Note  Tachycardia with HR in the 150s.  Received oral CCB earlier this AM.  HD stable with BP in the 150s systolic   eICU Interventions  Plan: 1 time dose of IV cardizem 10 mg for HR control   Intervention Category Intermediate Interventions: Arrhythmia - evaluation and management  Daxx Tiggs 06/18/2011, 5:07 AM

## 2011-06-19 DIAGNOSIS — I4892 Unspecified atrial flutter: Secondary | ICD-10-CM

## 2011-06-19 LAB — CBC
HCT: 29.3 % — ABNORMAL LOW (ref 39.0–52.0)
Hemoglobin: 10.1 g/dL — ABNORMAL LOW (ref 13.0–17.0)
MCH: 30.2 pg (ref 26.0–34.0)
MCHC: 34.5 g/dL (ref 30.0–36.0)
RDW: 13.1 % (ref 11.5–15.5)

## 2011-06-19 LAB — GLUCOSE, CAPILLARY: Glucose-Capillary: 65 mg/dL — ABNORMAL LOW (ref 70–99)

## 2011-06-19 LAB — BASIC METABOLIC PANEL
BUN: 65 mg/dL — ABNORMAL HIGH (ref 6–23)
Calcium: 9.5 mg/dL (ref 8.4–10.5)
GFR calc non Af Amer: 15 mL/min — ABNORMAL LOW (ref >90)
Glucose, Bld: 68 mg/dL — ABNORMAL LOW (ref 70–99)

## 2011-06-19 LAB — TSH: TSH: 1.24 u[IU]/mL (ref 0.350–4.500)

## 2011-06-19 MED ORDER — POTASSIUM CHLORIDE CRYS ER 20 MEQ PO TBCR
40.0000 meq | EXTENDED_RELEASE_TABLET | Freq: Once | ORAL | Status: AC
  Administered 2011-06-19: 40 meq via ORAL
  Filled 2011-06-19: qty 2

## 2011-06-19 MED ORDER — DILTIAZEM HCL ER COATED BEADS 240 MG PO CP24
240.0000 mg | ORAL_CAPSULE | Freq: Every day | ORAL | Status: DC
  Administered 2011-06-19 – 2011-06-24 (×6): 240 mg via ORAL
  Filled 2011-06-19 (×6): qty 1

## 2011-06-19 MED ORDER — INSULIN GLARGINE 100 UNIT/ML ~~LOC~~ SOLN: 65.0000 [IU] | Freq: Every day | SUBCUTANEOUS | Status: DC

## 2011-06-19 MED ORDER — INSULIN GLARGINE 100 UNIT/ML ~~LOC~~ SOLN
60.0000 [IU] | Freq: Every day | SUBCUTANEOUS | Status: DC
  Administered 2011-06-19: 60 [IU] via SUBCUTANEOUS

## 2011-06-19 NOTE — Progress Notes (Signed)
Subjective: A little sleepy today, patient feeling ok, no complaints.  Blood sugar low, he will get breakfast,  got apple sauce.  Objective: Filed Vitals:   06/19/11 0220 06/19/11 0400 06/19/11 0646 06/19/11 0800  BP: 162/76  154/83   Pulse: 73  65   Temp:  97.7 F (36.5 C)  97.9 F (36.6 C)  TempSrc:  Oral  Oral  Resp: 15  16   Height:      Weight:      SpO2: 98%  98%    Weight change:   Intake/Output Summary (Last 24 hours) at 06/19/11 0820 Last data filed at 06/19/11 0700  Gross per 24 hour  Intake   2550 ml  Output   4920 ml  Net  -2370 ml    General: Alert, awake, oriented x2, in no acute distress.  HEENT: No bruits, no goiter.  Heart: Regular rate and rhythm, without murmurs, rubs, gallops.  Lungs: CTA, bilateral air movement.  Abdomen: Soft, nontender, nondistended, positive bowel sounds.  Neuro: Grossly intact, nonfocal. Slow respond.    Lab Results:  Basename 06/19/11 0400 06/18/11 0405  NA 133* 131*  K 3.7 3.9  CL 101 100  CO2 19 18*  GLUCOSE 68* 128*  BUN 65* 68*  CREATININE 3.81* 4.05*  CALCIUM 9.5 9.7  MG -- --  PHOS -- --   Basename 06/19/11 0400 06/18/11 0405  WBC 13.8* 9.4  NEUTROABS -- --  HGB 10.1* 9.8*  HCT 29.3* 28.1*  MCV 87.7 87.5  PLT 236 178    Basename 06/18/11 1745 06/18/11 1410 06/18/11 0640  CKTOTAL 18 19 22   CKMB 2.4 2.4 2.8  CKMBINDEX -- -- --  TROPONINI <0.30 <0.30 <0.30    Basename 06/18/11 1745  TSH 1.240  T4TOTAL --  T3FREE --  THYROIDAB --     Micro Results: Recent Results (from the past 240 hour(s))  BODY FLUID CULTURE     Status: Normal   Collection Time   06/12/11 11:40 AM      Component Value Range Status Comment   Specimen Description KNEE JOINT LEFT   Final    Special Requests Normal   Final    Gram Stain     Final    Value: CYTOSPIN SLIDE WBC PRESENT,BOTH PMN AND MONONUCLEAR     ABUNDANT GRAM POSITIVE COCCI IN PAIRS     [id number]   Culture     Final    Value: ABUNDANT STAPHYLOCOCCUS AUREUS      Note: RIFAMPIN AND GENTAMICIN SHOULD NOT BE USED AS SINGLE DRUGS FOR TREATMENT OF STAPH INFECTIONS.   Report Status 06/15/2011 FINAL   Final    Organism ID, Bacteria STAPHYLOCOCCUS AUREUS   Final   BODY FLUID CULTURE     Status: Normal   Collection Time   06/12/11 12:10 PM      Component Value Range Status Comment   Specimen Description KNEE JOINT RIGHT   Final    Special Requests NONE   Final    Gram Stain     Final    Value: CYTOSPIN SLIDE WBC PRESENT, PREDOMINANTLY PMN     NO ORGANISMS SEEN   Culture NO GROWTH 3 DAYS   Final    Report Status 06/16/2011 FINAL   Final   CULTURE, BLOOD (ROUTINE X 2)     Status: Normal   Collection Time   06/12/11  3:00 PM      Component Value Range Status Comment   Specimen Description BLOOD RIGHT ARM  5 ML IN [facility] BOTTLE   Final    Special Requests NONE   Final    Culture  Setup Time [id number]   Final    Culture     Final    Value: STAPHYLOCOCCUS AUREUS     Note: RIFAMPIN AND GENTAMICIN SHOULD NOT BE USED AS SINGLE DRUGS FOR TREATMENT OF STAPH INFECTIONS. SUSCEPTIBILITIES PERFORMED ON PREVIOUS CULTURE WITHIN THE LAST 5 DAYS.     Note: Gram Stain Report Called to,Read Back By and Verified With: RN M. STOCKS ON 06/13/11 AT 2005 BY TEDAR   Report Status 06/15/2011 FINAL   Final   CULTURE, BLOOD (ROUTINE X 2)     Status: Normal   Collection Time   06/12/11  3:10 PM      Component Value Range Status Comment   Specimen Description BLOOD LEFT ARM  3 ML IN AEROBIC ONLY   Final    Special Requests NONE   Final    Culture  Setup Time [id number]   Final    Culture     Final    Value: STAPHYLOCOCCUS AUREUS     Note: RIFAMPIN AND GENTAMICIN SHOULD NOT BE USED AS SINGLE DRUGS FOR TREATMENT OF STAPH INFECTIONS.     Note: Gram Stain Report Called to,Read Back By and Verified With: RN M. STOCKS ON 06/13/11 AT 2005 BY TEDAR   Report Status 06/15/2011 FINAL   Final    Organism ID, Bacteria STAPHYLOCOCCUS AUREUS   Final   MRSA PCR SCREENING     Status: Normal    Collection Time   06/12/11  5:46 PM      Component Value Range Status Comment   MRSA by PCR NEGATIVE  NEGATIVE  Final   ANAEROBIC CULTURE     Status: Normal   Collection Time   06/12/11  7:40 PM      Component Value Range Status Comment   Specimen Description WOUND LEFT KNEE   Final    Special Requests NONE   Final    Gram Stain     Final    Value: FEW WBC PRESENT, PREDOMINANTLY PMN     NO SQUAMOUS EPITHELIAL CELLS SEEN     MODERATE GRAM POSITIVE COCCI IN PAIRS   Culture NO ANAEROBES ISOLATED   Final    Report Status 06/18/2011 FINAL   Final   WOUND CULTURE     Status: Normal   Collection Time   06/12/11  7:40 PM      Component Value Range Status Comment   Specimen Description WOUND LEFT KNEE   Final    Special Requests NONE   Final    Gram Stain     Final    Value: RARE WBC PRESENT, PREDOMINANTLY PMN     NO SQUAMOUS EPITHELIAL CELLS SEEN     FEW GRAM POSITIVE COCCI IN PAIRS   Culture     Final    Value: MODERATE STAPHYLOCOCCUS AUREUS     Note: RIFAMPIN AND GENTAMICIN SHOULD NOT BE USED AS SINGLE DRUGS FOR TREATMENT OF STAPH INFECTIONS.   Report Status 06/15/2011 FINAL   Final    Organism ID, Bacteria STAPHYLOCOCCUS AUREUS   Final   CULTURE, RESPIRATORY     Status: Normal   Collection Time   06/12/11 11:50 PM      Component Value Range Status Comment   Specimen Description SPUTUM   Final    Special Requests NONE TRACHAEL ASPPIRATE   Final    Gram Stain  Final    Value: NO WBC SEEN     NO SQUAMOUS EPITHELIAL CELLS SEEN     NO ORGANISMS SEEN   Culture     Final    Value: MODERATE STAPHYLOCOCCUS AUREUS     Note: RIFAMPIN AND GENTAMICIN SHOULD NOT BE USED AS SINGLE DRUGS FOR TREATMENT OF STAPH INFECTIONS.   Report Status 06/16/2011 FINAL   Final    Organism ID, Bacteria STAPHYLOCOCCUS AUREUS   Final   URINE CULTURE     Status: Normal   Collection Time   06/13/11  5:47 AM      Component Value Range Status Comment   Specimen Description URINE, CATHETERIZED   Final    Special  Requests NONE   Final    Culture  Setup Time [id number]   Final    Colony Count NO GROWTH   Final    Culture NO GROWTH   Final    Report Status 06/14/2011 FINAL   Final   CULTURE, BLOOD (ROUTINE X 2)     Status: Normal (Preliminary result)   Collection Time   06/14/11  4:30 PM      Component Value Range Status Comment   Specimen Description BLOOD RIGHT HAND   Final    Special Requests BOTTLES DRAWN AEROBIC AND ANAEROBIC 10CC   Final    Culture  Setup Time [id number]   Final    Culture     Final    Value:        BLOOD CULTURE RECEIVED NO GROWTH TO DATE CULTURE WILL BE HELD FOR 5 DAYS BEFORE ISSUING A FINAL NEGATIVE REPORT   Report Status PENDING   Incomplete   CULTURE, BLOOD (ROUTINE X 2)     Status: Normal (Preliminary result)   Collection Time   06/14/11  4:45 PM      Component Value Range Status Comment   Specimen Description BLOOD RIGHT ARM   Final    Special Requests BOTTLES DRAWN AEROBIC AND ANAEROBIC 10CC   Final    Culture  Setup Time [id number]   Final    Culture     Final    Value:        BLOOD CULTURE RECEIVED NO GROWTH TO DATE CULTURE WILL BE HELD FOR 5 DAYS BEFORE ISSUING A FINAL NEGATIVE REPORT   Report Status PENDING   Incomplete     Studies/Results: Dg Chest 1 View  06/18/2011  *RADIOLOGY REPORT*  Clinical Data: Central line placement.  CHEST - 1 VIEW  Comparison: 06/15/2011  Findings: Left jugular vein catheter tip is in the superior vena cava at the level of the azygos vein. No pneumothorax.  Heart size and vascularity are normal and the lungs are clear.  No osseous abnormality.  The patient has a narrowed transverse diameter of the trachea at the level of the thoracic inlet but this appears unchanged since the prior PET CT dated 07/02/2008.  There are no masses compressing the trachea.  IMPRESSION: No acute abnormalities.  The tip of the central line is in the superior vena cava directed toward the side wall.  Original Report Authenticated By: Gwynn Burly, M.D.    Ct Head Wo Contrast  06/18/2011  *RADIOLOGY REPORT*  Clinical Data: Altered mental status.  Confusion.  CT HEAD WITHOUT CONTRAST  Technique:  Contiguous axial images were obtained from the base of the skull through the vertex without contrast.  Comparison: MRI of the brain dated 04/22/2007.  Findings: There is a well-defined focus of decreased  attenuation immediately lateral to the head of the left caudate nucleus extending into the region of the left external capsule, similar to an area of 02/02 hyperintensity on remote MRI of the brain dated 04/22/2007, indicative of the sequelae of old lacunar infarction. No definite acute intracranial abnormalities.  Specifically, no definite signs of acute/subacute ischemia, focal mass, mass effect, hydrocephalus or abnormal intra or extra-axial fluid collections. There are extensive regions of decreased attenuation throughout the deep and periventricular white matter of the cerebral hemispheres bilaterally, most consistent with chronic microvascular ischemic changes.  No displaced skull fractures are identified.  The mastoids are largely sclerosis bilaterally, and visualized portions of the paranasal sinuses are remarkable for areas of mild mucosal thickening throughout the ethmoids, with a small air-fluid level in the right maxillary sinus.  IMPRESSION: 1.  No definite acute intracranial abnormalities (please note that accurate assessment for subtle regions acute/subacute ischemia upon this background of chronic microvascular ischemic changes in the brain is exceedingly difficult). 2.  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 3.  Mild mucosal thickening in the ethmoid sinuses and small air fluid level in the right maxillary sinus.  Clinical correlation for signs of symptoms of acute sinusitis is  recommended.  Original Report Authenticated By: Florencia Reasons, M.D.    Medications: I have reviewed the patient's current medications.  Staphylococcus aureus bacteremia (06/14/2011)  Continue with rifampin and Cefazolin day 8 of 6 weeks.  Will follow blood culture result from 3-11 , if no growth in 48 hour will consider PICC line. Also hopefully patient will have improvement in renal function. Will hold on PICC line, until more stable from HR.  I spoke with IR patient can have tunnel PICC IJ place for IV antibiotics to avoid peripheral PICC line due to renal function.  Will plan this for Monday.  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  Appreciate Dr Orvan Falconer help. Wound growth moderate gram positive in pair.   ARF (acute renal failure) on CKD (chronic kidney disease) stage 3, GFR  Acute on chronic renal failure, ATN probably in setting of infection. Renal US negative for hydronephrosis.  Cr trending down. Good urine output yesterday. Continue with IV fluids.  Daily B-met. Monitor for overload Ef 45 %, diastolic dysfunction.   DM (diabetes mellitus), type 2 with complications (07/16/2008) I will decrease lantus to 60 due to hypoglycemia.   HYPERCHOLESTEROLEMIA IIA (07/16/2008) Continue with statin.   ANEMIA, NORMOCYTIC, CHRONIC (07/16/2008) Hb stable.  HYPERTENSION, UNSPECIFIED (07/16/2008) Continue with Cardizem and PRN Hydralazine.   CAD, UNSPECIFIED SITE (07/16/2008) Continue with aspirin.   Bronchitis (06/05/2011)  Resolved.   Leukocytosis (06/12/2011) Secondary to infection.   Hyponatremia (06/12/2011)  Setting renal failure. Stable.  ECHO 3-13, Diastolic dysfunction and systolic.  Compensated.   Tachycardia// A Flutter: Cardizem to PO. Continue with metoprolol.  Appreciate cardio. No anticoagulation.   Encephalopathy: Confusion, fluctuate. CT head ordered. ? Medications, delirium. Less likely uremia, renal  function improving. I will discontinue paroxetine.          LOS: 7 days   Envi Eagleson M.D.  [facility] 06/19/2011, 8:20 AM

## 2011-06-19 NOTE — Progress Notes (Signed)
@   Subjective:  Denies CP or dyspnea   Objective:  Filed Vitals:   06/19/11 0400 06/19/11 0646 06/19/11 0700 06/19/11 0800  BP:  154/83    Pulse:  65 70 72  Temp: 97.7 F (36.5 C)   97.9 F (36.6 C)  TempSrc: Oral   Oral  Resp:  16 15 15   Height:      Weight:      SpO2:  98% 97% 98%    Intake/Output from previous day:  Intake/Output Summary (Last 24 hours) at 06/19/11 0905 Last data filed at 06/19/11 0800  Gross per 24 hour  Intake   2550 ml  Output   5070 ml  Net  -2520 ml    Physical Exam: Physical exam: Well-developed well-nourished in no acute distress.  Skin is warm and dry.  HEENT is normal.  Neck is supple. No thyromegaly.  Chest is clear to auscultation with normal expansion.  Cardiovascular exam is regular rate and rhythm.  Abdominal exam nontender or distended. No masses palpated. Extremities show 1+ edema on the left and s/p knee surgery neuro grossly intact    Lab Results: Basic Metabolic Panel:  Basename 06/19/11 0400 06/18/11 0405  NA 133* 131*  K 3.7 3.9  CL 101 100  CO2 19 18*  GLUCOSE 68* 128*  BUN 65* 68*  CREATININE 3.81* 4.05*  CALCIUM 9.5 9.7  MG -- --  PHOS -- --   CBC:  Basename 06/19/11 0400 06/18/11 0405  WBC 13.8* 9.4  NEUTROABS -- --  HGB 10.1* 9.8*  HCT 29.3* 28.1*  MCV 87.7 87.5  PLT 236 178   Cardiac Enzymes:  Basename 06/18/11 1745 06/18/11 1410 06/18/11 0640  CKTOTAL 18 19 22   CKMB 2.4 2.4 2.8  CKMBINDEX -- -- --  TROPONINI <0.30 <0.30 <0.30     Assessment/Plan:  1) Atrial flutter - remains in sinus; continue lopressor and cardizem (change to CD); no anticoagulation unless arrhythmia recurs 2) Knee infection-continue antibiotics 3) Acute on chronic renal insuff-management per primary care 4) CAD-continue aspirin and statin  Olga Millers 06/19/2011, 9:05 AM

## 2011-06-20 LAB — BASIC METABOLIC PANEL
BUN: 60 mg/dL — ABNORMAL HIGH (ref 6–23)
Calcium: 8.9 mg/dL (ref 8.4–10.5)
GFR calc non Af Amer: 16 mL/min — ABNORMAL LOW (ref >90)
Glucose, Bld: 83 mg/dL (ref 70–99)
Sodium: 134 mEq/L — ABNORMAL LOW (ref 135–145)

## 2011-06-20 LAB — CBC
Hemoglobin: 9 g/dL — ABNORMAL LOW (ref 13.0–17.0)
MCH: 30.3 pg (ref 26.0–34.0)
MCHC: 34.1 g/dL (ref 30.0–36.0)
RDW: 13.1 % (ref 11.5–15.5)

## 2011-06-20 LAB — GLUCOSE, CAPILLARY
Glucose-Capillary: 115 mg/dL — ABNORMAL HIGH (ref 70–99)
Glucose-Capillary: 122 mg/dL — ABNORMAL HIGH (ref 70–99)
Glucose-Capillary: 208 mg/dL — ABNORMAL HIGH (ref 70–99)

## 2011-06-20 MED ORDER — TRAZODONE 25 MG HALF TABLET
25.0000 mg | ORAL_TABLET | Freq: Every evening | ORAL | Status: DC | PRN
  Administered 2011-06-20 – 2011-06-22 (×2): 25 mg via ORAL
  Filled 2011-06-20 (×2): qty 1

## 2011-06-20 MED ORDER — INSULIN GLARGINE 100 UNIT/ML ~~LOC~~ SOLN: 55.0000 [IU] | Freq: Every day | SUBCUTANEOUS | Status: DC

## 2011-06-20 MED ORDER — INSULIN GLARGINE 100 UNIT/ML ~~LOC~~ SOLN: 50.0000 [IU] | Freq: Every day | SUBCUTANEOUS | Status: DC

## 2011-06-20 NOTE — Progress Notes (Signed)
Subjective: More conversant today. Had some bleeding from left knee. Eating ok. Was not able to sleep last night.   Objective: Filed Vitals:   06/20/11 0400 06/20/11 0620 06/20/11 0700 06/20/11 0800  BP:  153/64 155/76 145/77  Pulse:  72 75 80  Temp: 97.9 F (36.6 C)   98.1 F (36.7 C)  TempSrc: Oral   Axillary  Resp:  14 16 20   Height:      Weight:      SpO2:  97% 98% 97%   Weight change:   Intake/Output Summary (Last 24 hours) at 06/20/11 0936 Last data filed at 06/20/11 0800  Gross per 24 hour  Intake   3601 ml  Output   4100 ml  Net   -499 ml    General: Alert, awake, oriented x3, in no acute distress.  HEENT: No bruits, no goiter.  Heart: Regular rate and rhythm, without murmurs, rubs, gallops.  Lungs: CTA, bilateral air movement.  Abdomen: Soft, nontender, nondistended, positive bowel sounds.  Neuro: Grossly intact, nonfocal. Extremities; left knee with suture, no active bleeding.   Lab Results:  Basename 06/20/11 0430 06/19/11 0400  NA 134* 133*  K 4.1 3.7  CL 104 101  CO2 20 19  GLUCOSE 83 68*  BUN 60* 65*  CREATININE 3.56* 3.81*  CALCIUM 8.9 9.5  MG -- --  PHOS -- --    Basename 06/20/11 0430 06/19/11 0400  WBC 9.5 13.8*  NEUTROABS -- --  HGB 9.0* 10.1*  HCT 26.4* 29.3*  MCV 88.9 87.7  PLT 214 236    Basename 06/18/11 1745 06/18/11 1410 06/18/11 0640  CKTOTAL 18 19 22   CKMB 2.4 2.4 2.8  CKMBINDEX -- -- --  TROPONINI <0.30 <0.30 <0.30    Basename 06/18/11 1745  TSH 1.240  T4TOTAL --  T3FREE --  THYROIDAB --    Micro Results: Recent Results (from the past 240 hour(s))  BODY FLUID CULTURE     Status: Normal   Collection Time   06/12/11 11:40 AM      Component Value Range Status Comment   Specimen Description KNEE JOINT LEFT   Final    Special Requests Normal   Final    Gram Stain     Final    Value: CYTOSPIN SLIDE WBC PRESENT,BOTH PMN AND MONONUCLEAR     ABUNDANT GRAM POSITIVE COCCI IN PAIRS     [id number]   Culture     Final    Value: ABUNDANT STAPHYLOCOCCUS AUREUS     Note: RIFAMPIN AND GENTAMICIN SHOULD NOT BE USED AS SINGLE DRUGS FOR TREATMENT OF STAPH INFECTIONS.   Report Status 06/15/2011 FINAL   Final    Organism ID, Bacteria STAPHYLOCOCCUS AUREUS   Final   BODY FLUID CULTURE     Status: Normal   Collection Time   06/12/11 12:10 PM      Component Value Range Status Comment   Specimen Description KNEE JOINT RIGHT   Final    Special Requests NONE   Final    Gram Stain     Final    Value: CYTOSPIN SLIDE WBC PRESENT, PREDOMINANTLY PMN     NO ORGANISMS SEEN   Culture NO GROWTH 3 DAYS   Final    Report Status 06/16/2011 FINAL   Final   CULTURE, BLOOD (ROUTINE X 2)     Status: Normal   Collection Time   06/12/11  3:00 PM      Component Value Range Status Comment   Specimen Description  BLOOD RIGHT ARM  5 ML IN [facility] BOTTLE   Final    Special Requests NONE   Final    Culture  Setup Time [id number]   Final    Culture     Final    Value: STAPHYLOCOCCUS AUREUS     Note: RIFAMPIN AND GENTAMICIN SHOULD NOT BE USED AS SINGLE DRUGS FOR TREATMENT OF STAPH INFECTIONS. SUSCEPTIBILITIES PERFORMED ON PREVIOUS CULTURE WITHIN THE LAST 5 DAYS.     Note: Gram Stain Report Called to,Read Back By and Verified With: RN M. STOCKS ON 06/13/11 AT 2005 BY TEDAR   Report Status 06/15/2011 FINAL   Final   CULTURE, BLOOD (ROUTINE X 2)     Status: Normal   Collection Time   06/12/11  3:10 PM      Component Value Range Status Comment   Specimen Description BLOOD LEFT ARM  3 ML IN AEROBIC ONLY   Final    Special Requests NONE   Final    Culture  Setup Time [id number]   Final    Culture     Final    Value: STAPHYLOCOCCUS AUREUS     Note: RIFAMPIN AND GENTAMICIN SHOULD NOT BE USED AS SINGLE DRUGS FOR TREATMENT OF STAPH INFECTIONS.     Note: Gram Stain Report Called to,Read Back By and Verified With: RN M. STOCKS ON 06/13/11 AT 2005 BY TEDAR   Report Status 06/15/2011 FINAL   Final    Organism ID, Bacteria STAPHYLOCOCCUS AUREUS   Final     MRSA PCR SCREENING     Status: Normal   Collection Time   06/12/11  5:46 PM      Component Value Range Status Comment   MRSA by PCR NEGATIVE  NEGATIVE  Final   ANAEROBIC CULTURE     Status: Normal   Collection Time   06/12/11  7:40 PM      Component Value Range Status Comment   Specimen Description WOUND LEFT KNEE   Final    Special Requests NONE   Final    Gram Stain     Final    Value: FEW WBC PRESENT, PREDOMINANTLY PMN     NO SQUAMOUS EPITHELIAL CELLS SEEN     MODERATE GRAM POSITIVE COCCI IN PAIRS   Culture NO ANAEROBES ISOLATED   Final    Report Status 06/18/2011 FINAL   Final   WOUND CULTURE     Status: Normal   Collection Time   06/12/11  7:40 PM      Component Value Range Status Comment   Specimen Description WOUND LEFT KNEE   Final    Special Requests NONE   Final    Gram Stain     Final    Value: RARE WBC PRESENT, PREDOMINANTLY PMN     NO SQUAMOUS EPITHELIAL CELLS SEEN     FEW GRAM POSITIVE COCCI IN PAIRS   Culture     Final    Value: MODERATE STAPHYLOCOCCUS AUREUS     Note: RIFAMPIN AND GENTAMICIN SHOULD NOT BE USED AS SINGLE DRUGS FOR TREATMENT OF STAPH INFECTIONS.   Report Status 06/15/2011 FINAL   Final    Organism ID, Bacteria STAPHYLOCOCCUS AUREUS   Final   CULTURE, RESPIRATORY     Status: Normal   Collection Time   06/12/11 11:50 PM      Component Value Range Status Comment   Specimen Description SPUTUM   Final    Special Requests NONE TRACHAEL ASPPIRATE   Final  Gram Stain     Final    Value: NO WBC SEEN     NO SQUAMOUS EPITHELIAL CELLS SEEN     NO ORGANISMS SEEN   Culture     Final    Value: MODERATE STAPHYLOCOCCUS AUREUS     Note: RIFAMPIN AND GENTAMICIN SHOULD NOT BE USED AS SINGLE DRUGS FOR TREATMENT OF STAPH INFECTIONS.   Report Status 06/16/2011 FINAL   Final    Organism ID, Bacteria STAPHYLOCOCCUS AUREUS   Final   URINE CULTURE     Status: Normal   Collection Time   06/13/11  5:47 AM      Component Value Range Status Comment   Specimen Description  URINE, CATHETERIZED   Final    Special Requests NONE   Final    Culture  Setup Time [id number]   Final    Colony Count NO GROWTH   Final    Culture NO GROWTH   Final    Report Status 06/14/2011 FINAL   Final   CULTURE, BLOOD (ROUTINE X 2)     Status: Normal (Preliminary result)   Collection Time   06/14/11  4:30 PM      Component Value Range Status Comment   Specimen Description BLOOD RIGHT HAND   Final    Special Requests BOTTLES DRAWN AEROBIC AND ANAEROBIC 10CC   Final    Culture  Setup Time [id number]   Final    Culture     Final    Value:        BLOOD CULTURE RECEIVED NO GROWTH TO DATE CULTURE WILL BE HELD FOR 5 DAYS BEFORE ISSUING A FINAL NEGATIVE REPORT   Report Status PENDING   Incomplete   CULTURE, BLOOD (ROUTINE X 2)     Status: Normal (Preliminary result)   Collection Time   06/14/11  4:45 PM      Component Value Range Status Comment   Specimen Description BLOOD RIGHT ARM   Final    Special Requests BOTTLES DRAWN AEROBIC AND ANAEROBIC 10CC   Final    Culture  Setup Time [id number]   Final    Culture     Final    Value:        BLOOD CULTURE RECEIVED NO GROWTH TO DATE CULTURE WILL BE HELD FOR 5 DAYS BEFORE ISSUING A FINAL NEGATIVE REPORT   Report Status PENDING   Incomplete     Studies/Results: Ct Head Wo Contrast  06/18/2011  *RADIOLOGY REPORT*  Clinical Data: Altered mental status.  Confusion.  CT HEAD WITHOUT CONTRAST  Technique:  Contiguous axial images were obtained from the base of the skull through the vertex without contrast.  Comparison: MRI of the brain dated 04/22/2007.  Findings: There is a well-defined focus of decreased attenuation immediately lateral to the head of the left caudate nucleus extending into the region of the left external capsule, similar to an area of 02/02 hyperintensity on remote MRI of the brain dated 04/22/2007, indicative of the sequelae of old lacunar infarction. No definite acute intracranial abnormalities.  Specifically, no definite  signs of acute/subacute ischemia, focal mass, mass effect, hydrocephalus or abnormal intra or extra-axial fluid collections. There are extensive regions of decreased attenuation throughout the deep and periventricular white matter of the cerebral hemispheres bilaterally, most consistent with chronic microvascular ischemic changes.  No displaced skull fractures are identified.  The mastoids are largely sclerosis bilaterally, and visualized portions of the paranasal sinuses are remarkable for areas of mild mucosal thickening throughout the ethmoids,  with a small air-fluid level in the right maxillary sinus.  IMPRESSION: 1.  No definite acute intracranial abnormalities (please note that accurate assessment for subtle regions acute/subacute ischemia upon this background of chronic microvascular ischemic changes in the brain is exceedingly difficult). 2.  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 3.  Mild mucosal thickening in the ethmoid sinuses and small air fluid level in the right maxillary sinus.  Clinical correlation for signs of symptoms of acute sinusitis is recommended.  Original Report Authenticated By: Florencia Reasons, M.D.    Medications: I have reviewed the patient's current medications.  Staphylococcus aureus bacteremia (06/14/2011)  Continue with rifampin and Cefazolin day 9 of 6 weeks.  Will follow blood culture result from 3-11 ,  no growth in 48 hour will consider PICC line.  I spoke with IR patient can have tunnel PICC IJ place for IV antibiotics to avoid peripheral PICC line due to renal function.  Will plan this for Monday.   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  Appreciate Dr Orvan Falconer help. Wound growth moderate gram positive in pair.   ARF (acute  renal failure) on CKD (chronic kidney disease) stage 3, GFR  Acute on chronic renal failure, ATN probably in setting of infection. Renal US negative for hydronephrosis.  Cr trending down. Good urine output yesterday. Continue with IV fluids. Daily B-met. Monitor for overload Ef 45 %, diastolic dysfunction.   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 Hold tonight due to NPO after midnight for PICC line placement.   HYPERCHOLESTEROLEMIA IIA (07/16/2008) Continue with statin.   ANEMIA, NORMOCYTIC, CHRONIC (07/16/2008) Hb stable.  HYPERTENSION, UNSPECIFIED (07/16/2008) Continue with Cardizem and PRN Hydralazine.   CAD, UNSPECIFIED SITE (07/16/2008) Continue with aspirin.   Bronchitis (06/05/2011)  Resolved.   Leukocytosis (06/12/2011) Secondary to infection.   Hyponatremia (06/12/2011)  Setting renal failure. Stable.  ECHO 3-13, Diastolic dysfunction and systolic.  Compensated.   Tachycardia// A Flutter: Cardizem to PO. Continue with metoprolol. Appreciate cardio. No anticoagulation.   Encephalopathy: Confusion, fluctuate. CT head ordered. ? Medications, delirium. Less likely uremia, renal function improving.Hold paroxetine. Will try trazodone to help him sleep.   Wife updated. I will ask SW consult for SNF short term rehab. PT evaluation.     LOS: 8 days   Alann Avey M.D.  [facility] 06/20/2011, 9:36 AM

## 2011-06-21 ENCOUNTER — Inpatient Hospital Stay (HOSPITAL_COMMUNITY): Payer: Medicare Other

## 2011-06-21 LAB — GLUCOSE, CAPILLARY: Glucose-Capillary: 108 mg/dL — ABNORMAL HIGH (ref 70–99)

## 2011-06-21 LAB — CULTURE, BLOOD (ROUTINE X 2): Culture: NO GROWTH

## 2011-06-21 LAB — CBC
Platelets: 242 10*3/uL (ref 150–400)
RDW: 13 % (ref 11.5–15.5)
WBC: 8.5 10*3/uL (ref 4.0–10.5)

## 2011-06-21 LAB — BASIC METABOLIC PANEL
Chloride: 103 mEq/L (ref 96–112)
GFR calc Af Amer: 21 mL/min — ABNORMAL LOW (ref >90)
Potassium: 4.1 mEq/L (ref 3.5–5.1)

## 2011-06-21 MED ORDER — LIDOCAINE HCL 2 % IJ SOLN
INTRAMUSCULAR | Status: AC
  Administered 2011-06-21: 20 mg
  Filled 2011-06-21: qty 1

## 2011-06-21 NOTE — Procedures (Signed)
RIJV tunneled PICC 18 cm Tip SVC RA

## 2011-06-21 NOTE — Progress Notes (Signed)
Subjective: Patient with no complaints. Was able to sleep last night. No more bleeding from left knee.  Objective: Filed Vitals:   06/21/11 0000 06/21/11 0200 06/21/11 0400 06/21/11 0800  BP: 134/57 136/51 122/52   Pulse: 76 78 75   Temp: 98.9 F (37.2 C)  98.8 F (37.1 C) 98.4 F (36.9 C)  TempSrc: Oral  Oral Oral  Resp: 16 15 16    Height:      Weight:      SpO2: 100% 100% 97%    Weight change:   Intake/Output Summary (Last 24 hours) at 06/21/11 0859 Last data filed at 06/21/11 0600  Gross per 24 hour  Intake   2650 ml  Output   3975 ml  Net  -1325 ml    General: Alert, awake, oriented x3, in no acute distress.  HEENT: No bruits, no goiter.  Heart: Regular rate and rhythm, without murmurs, rubs, gallops.  Lungs: CTA, bilateral air movement.  Abdomen: Soft, nontender, nondistended, positive bowel sounds.  Neuro: Grossly intact, nonfocal. Extremities: left knee with clean dressing.   Lab Results:  [facility] 06/21/11 0444 06/20/11 0430  NA 134* 134*  K 4.1 4.1  CL 103 104  CO2 20 20  GLUCOSE 101* 83  BUN 55* 60*  CREATININE 3.18* 3.56*  CALCIUM 8.6 8.9  MG -- --  PHOS -- --    Basename 06/21/11 0444 06/20/11 0430  WBC 8.5 9.5  NEUTROABS -- --  HGB 8.4* 9.0*  HCT 24.9* 26.4*  MCV 89.6 88.9  PLT 242 214    Basename 06/18/11 1745 06/18/11 1410  CKTOTAL 18 19  CKMB 2.4 2.4  CKMBINDEX -- --  TROPONINI <0.30 <0.30    Basename 06/18/11 1745  TSH 1.240  T4TOTAL --  T3FREE --  THYROIDAB --    Micro Results: Recent Results (from the past 240 hour(s))  BODY FLUID CULTURE     Status: Normal   Collection Time   06/12/11 11:40 AM      Component Value Range Status Comment   Specimen Description KNEE JOINT LEFT   Final    Special Requests Normal   Final    Gram Stain     Final    Value: CYTOSPIN SLIDE WBC PRESENT,BOTH PMN AND MONONUCLEAR     ABUNDANT GRAM POSITIVE COCCI IN PAIRS     [id number]   Culture     Final    Value: ABUNDANT STAPHYLOCOCCUS AUREUS       Note: RIFAMPIN AND GENTAMICIN SHOULD NOT BE USED AS SINGLE DRUGS FOR TREATMENT OF STAPH INFECTIONS.   Report Status 06/15/2011 FINAL   Final    Organism ID, Bacteria STAPHYLOCOCCUS AUREUS   Final   BODY FLUID CULTURE     Status: Normal   Collection Time   06/12/11 12:10 PM      Component Value Range Status Comment   Specimen Description KNEE JOINT RIGHT   Final    Special Requests NONE   Final    Gram Stain     Final    Value: CYTOSPIN SLIDE WBC PRESENT, PREDOMINANTLY PMN     NO ORGANISMS SEEN   Culture NO GROWTH 3 DAYS   Final    Report Status 06/16/2011 FINAL   Final   CULTURE, BLOOD (ROUTINE X 2)     Status: Normal   Collection Time   06/12/11  3:00 PM      Component Value Range Status Comment   Specimen Description BLOOD RIGHT ARM  5 ML IN  EACH BOTTLE   Final    Special Requests NONE   Final    Culture  Setup Time [id number]   Final    Culture     Final    Value: STAPHYLOCOCCUS AUREUS     Note: RIFAMPIN AND GENTAMICIN SHOULD NOT BE USED AS SINGLE DRUGS FOR TREATMENT OF STAPH INFECTIONS. SUSCEPTIBILITIES PERFORMED ON PREVIOUS CULTURE WITHIN THE LAST 5 DAYS.     Note: Gram Stain Report Called to,Read Back By and Verified With: RN M. STOCKS ON 06/13/11 AT 2005 BY TEDAR   Report Status 06/15/2011 FINAL   Final   CULTURE, BLOOD (ROUTINE X 2)     Status: Normal   Collection Time   06/12/11  3:10 PM      Component Value Range Status Comment   Specimen Description BLOOD LEFT ARM  3 ML IN AEROBIC ONLY   Final    Special Requests NONE   Final    Culture  Setup Time [id number]   Final    Culture     Final    Value: STAPHYLOCOCCUS AUREUS     Note: RIFAMPIN AND GENTAMICIN SHOULD NOT BE USED AS SINGLE DRUGS FOR TREATMENT OF STAPH INFECTIONS.     Note: Gram Stain Report Called to,Read Back By and Verified With: RN M. STOCKS ON 06/13/11 AT 2005 BY TEDAR   Report Status 06/15/2011 FINAL   Final    Organism ID, Bacteria STAPHYLOCOCCUS AUREUS   Final   MRSA PCR SCREENING     Status: Normal    Collection Time   06/12/11  5:46 PM      Component Value Range Status Comment   MRSA by PCR NEGATIVE  NEGATIVE  Final   ANAEROBIC CULTURE     Status: Normal   Collection Time   06/12/11  7:40 PM      Component Value Range Status Comment   Specimen Description WOUND LEFT KNEE   Final    Special Requests NONE   Final    Gram Stain     Final    Value: FEW WBC PRESENT, PREDOMINANTLY PMN     NO SQUAMOUS EPITHELIAL CELLS SEEN     MODERATE GRAM POSITIVE COCCI IN PAIRS   Culture NO ANAEROBES ISOLATED   Final    Report Status 06/18/2011 FINAL   Final   WOUND CULTURE     Status: Normal   Collection Time   06/12/11  7:40 PM      Component Value Range Status Comment   Specimen Description WOUND LEFT KNEE   Final    Special Requests NONE   Final    Gram Stain     Final    Value: RARE WBC PRESENT, PREDOMINANTLY PMN     NO SQUAMOUS EPITHELIAL CELLS SEEN     FEW GRAM POSITIVE COCCI IN PAIRS   Culture     Final    Value: MODERATE STAPHYLOCOCCUS AUREUS     Note: RIFAMPIN AND GENTAMICIN SHOULD NOT BE USED AS SINGLE DRUGS FOR TREATMENT OF STAPH INFECTIONS.   Report Status 06/15/2011 FINAL   Final    Organism ID, Bacteria STAPHYLOCOCCUS AUREUS   Final   CULTURE, RESPIRATORY     Status: Normal   Collection Time   06/12/11 11:50 PM      Component Value Range Status Comment   Specimen Description SPUTUM   Final    Special Requests NONE TRACHAEL ASPPIRATE   Final    Gram Stain     Final  Value: NO WBC SEEN     NO SQUAMOUS EPITHELIAL CELLS SEEN     NO ORGANISMS SEEN   Culture     Final    Value: MODERATE STAPHYLOCOCCUS AUREUS     Note: RIFAMPIN AND GENTAMICIN SHOULD NOT BE USED AS SINGLE DRUGS FOR TREATMENT OF STAPH INFECTIONS.   Report Status 06/16/2011 FINAL   Final    Organism ID, Bacteria STAPHYLOCOCCUS AUREUS   Final   URINE CULTURE     Status: Normal   Collection Time   06/13/11  5:47 AM      Component Value Range Status Comment   Specimen Description URINE, CATHETERIZED   Final    Special  Requests NONE   Final    Culture  Setup Time [id number]   Final    Colony Count NO GROWTH   Final    Culture NO GROWTH   Final    Report Status 06/14/2011 FINAL   Final   CULTURE, BLOOD (ROUTINE X 2)     Status: Normal (Preliminary result)   Collection Time   06/14/11  4:30 PM      Component Value Range Status Comment   Specimen Description BLOOD RIGHT HAND   Final    Special Requests BOTTLES DRAWN AEROBIC AND ANAEROBIC 10CC   Final    Culture  Setup Time [id number]   Final    Culture     Final    Value:        BLOOD CULTURE RECEIVED NO GROWTH TO DATE CULTURE WILL BE HELD FOR 5 DAYS BEFORE ISSUING A FINAL NEGATIVE REPORT   Report Status PENDING   Incomplete   CULTURE, BLOOD (ROUTINE X 2)     Status: Normal (Preliminary result)   Collection Time   06/14/11  4:45 PM      Component Value Range Status Comment   Specimen Description BLOOD RIGHT ARM   Final    Special Requests BOTTLES DRAWN AEROBIC AND ANAEROBIC 10CC   Final    Culture  Setup Time [id number]   Final    Culture     Final    Value:        BLOOD CULTURE RECEIVED NO GROWTH TO DATE CULTURE WILL BE HELD FOR 5 DAYS BEFORE ISSUING A FINAL NEGATIVE REPORT   Report Status PENDING   Incomplete     Studies/Results: No results found.  Medications: I have reviewed the patient's current medications.   1-Staphylococcus aureus bacteremia (06/14/2011)  Continue with rifampin and Cefazolin day 10 of 6 weeks.   repeated blood culture result from 3-11 , no growth to date. I spoke with IR patient can have tunnel PICC IJ place for IV antibiotics to avoid peripheral PICC line due to renal function. 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  Appreciate Dr Orvan Falconer help.  3-ARF (acute renal failure) on CKD (chronic kidney disease) stage 3, GFR  Acute on chronic renal failure, ATN probably in setting of infection. Renal US negative for  hydronephrosis.  Cr trending down. Good urine output . Continue with IV fluids. Daily B-met. Monitor for overload Ef 45 %, diastolic dysfunction.  Cr decrease to 3.1.  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 SSI.  5-HYPERCHOLESTEROLEMIA IIA (07/16/2008) Continue with statin.   6-ANEMIA, NORMOCYTIC, CHRONIC (07/16/2008) Hb trending down. Had some blood loss from left knee. I  will check anemia panel.    7-HYPERTENSION, UNSPECIFIED (07/16/2008) Continue with Cardizem and PRN Hydralazine.   8-CAD, UNSPECIFIED SITE (07/16/2008) Continue with aspirin.   9-Leukocytosis (06/12/2011) Secondary to infection. Resolved.  10-Hyponatremia (06/12/2011)  Setting renal failure. Improving.  11-ECHO 3-13, Diastolic dysfunction and systolic.  Compensated.  12-Tachycardia// A Flutter: Cardizem to PO. Continue with metoprolol. Appreciate cardio. No anticoagulation.  13-Encephalopathy: Confusion, fluctuate. CT head ordered. ? Medications, delirium. Less likely uremia, renal function improving.Hold paroxetine. Will try trazodone to help him with sleep.   I will ask SW consult for SNF short term rehab. PT evaluation.  Transfer to telemetry.      LOS: 9 days   Undine Nealis M.D.  [facility] 06/21/2011, 8:59 AM

## 2011-06-21 NOTE — Progress Notes (Signed)
Subjective:   [name] is a 71yo male with PMHx significant for CAD (unspecified PTCA x 2 in 1992), ICM (echo 03/13: LVEF 40-45%, grade 2 diastolic dysfunction, inferior and posterior wall hypokinesis, technically poor study), type 2 DM, HTN, HL, CKD (stage III), colorectal cancer and ulcerative colitis (s/p coloproctectomy with subsequent XRT and chemo in 2007, now with colostomy bag) and history of artificial L knee replacement in 2009 admitted to [facility] hospital on 06/12/11 with MSSA bacteremia sourced from a septic L artificial knee joint. Of note, he had recently been discharged from the hospital for chest pain diagnosed as an acute bronchitis.   We were consulted on 3/15 for A-flutter.  He remains in NSR.  No cardiac complaints.      Marland Kitchen aspirin  81 mg Oral Daily  . ezetimibe  10 mg Oral QHS   And  . atorvastatin  20 mg Oral QHS  .  ceFAZolin (ANCEF) IV  1 g Intravenous Q12H  . diltiazem  240 mg Oral Daily  . enoxaparin  40 mg Subcutaneous Q24H  . insulin aspart  0-20 Units Subcutaneous TID WC  . metoprolol tartrate  25 mg Oral BID  . mulitivitamin with minerals  1 tablet Oral Daily  . pantoprazole  40 mg Oral Q1200  . rifampin  600 mg Oral Daily  . DISCONTD: insulin aspart  0-5 Units Subcutaneous QHS  . DISCONTD: insulin glargine  50 Units Subcutaneous QHS  . DISCONTD: insulin glargine  55 Units Subcutaneous QHS  . DISCONTD: insulin glargine  60 Units Subcutaneous QHS      . sodium chloride 100 mL/hr at 06/21/11 0209    Objective:  Vital Signs in the last 24 hours: Blood pressure 122/52, pulse 75, temperature 98.8 F (37.1 C), temperature source Oral, resp. rate 16, height 6' (1.829 m), weight 265 lb 14 oz (120.6 kg), SpO2 97.00%. Temp:  [98.1 F (36.7 C)-98.9 F (37.2 C)] 98.8 F (37.1 C) (03/18 0400) Pulse Rate:  [61-83] 75  (03/18 0400) Resp:  [14-20] 16  (03/18 0400) BP: (111-158)/(33-80) 122/52 mmHg (03/18 0400) SpO2:  [97 %-100 %] 97 % (03/18  0400)  Intake/Output from previous day: 03/17 0701 - 03/18 0700 In: 2990 [P.O.:840; I.V.:2100; IV Piggyback:50] Out: 4675 [Urine:3725; Stool:950] Intake/Output from this shift: Total I/O In: 900 [I.V.:900] Out: 2125 [Urine:1925; Stool:200]  Physical Exam:  Physical Exam: Blood pressure 122/52, pulse 75, temperature 98.8 F (37.1 C), temperature source Oral, resp. rate 16, height 6' (1.829 m), weight 265 lb 14 oz (120.6 kg), SpO2 97.00%. General: Well developed, well nourished, in no acute distress. Head: Normocephalic, atraumatic, , mucus membranes are moist,  Neck: Supple. Normal carotids. No JVD Lungs: Clear bilaterally to auscultation without wheezes, rales, or rhonchi . Heart: Regular rate,  With normal  S1 S2. Soft systolic murmur Abdomen: Soft, non-tender, colostomy bag Msk:  Strength and tone appear normal for age. Extremities: No clubbing or cyanosis. No edema.  Distal pedal pulses are 2+ and equal bilaterally. Neuro: Alert and oriented X 3. Psych:  Responds to questions appropriately with a normal affect.    Lab Results:   Basename 06/21/11 0444 06/20/11 0430  NA 134* 134*  K 4.1 4.1  CL 103 104  CO2 20 20  GLUCOSE 101* 83  BUN 55* 60*  CREATININE 3.18* 3.56*  CALCIUM 8.6 8.9  MG -- --  PHOS -- --   No results found for this basename: AST:2,ALT:2,ALKPHOS:2,BILITOT:2,PROT:2,ALBUMIN:2 in the last 72 hours No results found  for this basename: LIPASE:2,AMYLASE:2 in the last 72 hours  Basename 06/21/11 0444 06/20/11 0430  WBC 8.5 9.5  NEUTROABS -- --  HGB 8.4* 9.0*  HCT 24.9* 26.4*  MCV 89.6 88.9  PLT 242 214    Basename 06/18/11 1745 06/18/11 1410  CKTOTAL 18 19  CKMB 2.4 2.4  TROPONINI <0.30 <0.30    Basename 06/18/11 1745  TSH 1.240  T4TOTAL --  T3FREE --  THYROIDAB --   No results found for this basename: VITAMINB12,FOLATE,FERRITIN,TIBC,IRON,RETICCTPCT in the last 72 hours  Tele:  NSR  Assessment/Plan:   1. Atrial flutter:  He remains in  NSR.  I suspect the a-flutter was due to his infection.  Continue current meds.  He is to follow up with Dr. Mariah [name] in several weeks.    Will sign off. Call for questions.   Vesta Mixer, Montez Hageman., MD, [facility] 06/21/2011, 6:50 AM LOS: Day 9

## 2011-06-21 NOTE — Progress Notes (Signed)
Pt had a large amount of serrosanguinous drainage( more bloody than serous) spurt from lt knee incision(lower aspect) between staples soaking 1/2 of a bath towel when pt was stood by PT at side of bed this morning. Lina Sayre PA ortho notified by phone. [name]

## 2011-06-21 NOTE — Progress Notes (Signed)
Physical Therapy Treatment Patient Details Name: [name] MRN: [id number] DOB: 1940/05/21 Today's Date: 06/21/2011  PT Assessment/Plan  PT - Assessment/Plan Comments on Treatment Session: Patient able to stand with less assist, but with obvious weakness and risk for fall with +1 assist as well as with leg draining.  Will need +2 assist for safety till more stable.  Patient tolerated, however without c/o of pain. PT Plan: Discharge plan remains appropriate PT Frequency: Min 4X/week Follow Up Recommendations: Home health PT;Skilled nursing facility Equipment Recommended: Defer to next venue PT Goals  Acute Rehab PT Goals Pt will go Supine/Side to Sit: with supervision PT Goal: Supine/Side to Sit - Progress: Progressing toward goal Pt will go Sit to Supine/Side: with min assist PT Goal: Sit to Supine/Side - Progress: Progressing toward goal Pt will go Sit to Stand: with min assist PT Goal: Sit to Stand - Progress: Progressing toward goal Pt will go Stand to Sit: with min assist PT Goal: Stand to Sit - Progress: Progressing toward goal Pt will Perform Home Exercise Program: with supervision, verbal cues required/provided PT Goal: Perform Home Exercise Program - Progress: Progressing toward goal  PT Treatment Precautions/Restrictions  Precautions Precautions: Knee Restrictions Weight Bearing Restrictions: Yes LLE Weight Bearing: Weight bearing as tolerated Mobility (including Balance) Bed Mobility Supine to Sit: 4: Min assist Supine to Sit Details (indicate cue type and reason): assist for lines and left leg Sitting - Scoot to Edge of Bed: 4: Min assist Sitting - Scoot to [place] of Bed Details (indicate cue type and reason): for safety to keep from sliding off edge of bed Sit to Supine: 3: Mod assist Sit to Supine - Details (indicate cue type and reason): assist for both legs into bed Scooting to HOB: 1: +2 Total assist Scooting to [facility] Details (indicate cue type and reason):  pt=30%; attempted to have patient assist more, but c/o pain right lower back. Transfers Sit to Stand: 3: Mod assist;With upper extremity assist;From elevated surface;From bed Sit to Stand Details (indicate cue type and reason): pt=70% assist due to LE weakness Stand to Sit: 4: Min assist;To bed;With upper extremity assist Stand to Sit Details: sits with uncontrolled descent; did not transfer OOB due to left knee draining in standing and needing to get leg elevated quickly    Exercise  General Exercises - Lower Extremity Ankle Circles/Pumps: AROM;Both;15 reps;Supine Short Arc Quad: AROM;Left;10 reps;Supine Heel Slides: AAROM;Both;10 reps;Supine End of Session PT - End of Session Equipment Utilized During Treatment: Gait belt Activity Tolerance: Treatment limited secondary to medical complications (Comment) (incision left knee draining, RN aware) Patient left: in bed;with call bell in reach Nurse Communication: Other (comment) (knee draining) General Behavior During Session: [facility] for tasks performed Cognition: [facility] for tasks performed  [facility] 06/21/2011, 1:42 PM

## 2011-06-21 NOTE — Progress Notes (Signed)
Patient ID: [name], male   DOB: 1940-07-01, 71 y.o.   MRN: [id number]  INFECTIOUS DISEASE PROGRESS NOTE    Date of Admission:  06/12/2011   Total days of antibiotics 10          Subjective: He denies pain in his left knee. Apparently had some bleeding from the left knee incision yesterday but has not had any overnight.  Objective: Temp:  [98.4 F (36.9 C)-98.9 F (37.2 C)] 98.4 F (36.9 C) (03/18 0800) Pulse Rate:  [61-83] 76  (03/18 1010) Resp:  [14-18] 18  (03/18 0800) BP: (111-158)/(33-80) 149/76 mmHg (03/18 1010) SpO2:  [97 %-100 %] 99 % (03/18 0800)  General: Alert and conversant Skin: Left knee incision is intact without any bleeding or inflammation   Lab Results Lab Results  Component Value Date   WBC 8.5 06/21/2011   HGB 8.4* 06/21/2011   HCT 24.9* 06/21/2011   MCV 89.6 06/21/2011   PLT 242 06/21/2011    Lab Results  Component Value Date   CREATININE 3.18* 06/21/2011   BUN 55* 06/21/2011   NA 134* 06/21/2011   K 4.1 06/21/2011   CL 103 06/21/2011   CO2 20 06/21/2011    Lab Results  Component Value Date   ALT 20 06/06/2011   AST 18 06/06/2011   ALKPHOS 46 06/06/2011   BILITOT 0.4 06/06/2011     Assessment: He is making some slow progress in therapy for MSSA bacteremia and a left prosthetic knee infection. As his renal function improves will need to be careful to adjust the dosing of his cefazolin. He will need at least 6 weeks of IV therapy and then conversion to a full oral regimen for up to 6 months.  Plan: 1. Continue current antibiotics 2. I will followup later this week   Cliffton Asters, MD [facility] [phone] pager   [phone] cell 06/21/2011, 10:19 AM

## 2011-06-21 NOTE — Progress Notes (Signed)
Patient ID: [name], male   DOB: 04-03-1941, 71 y.o.   MRN: [id number] PATIENT ID: [name]        MRN:  [id number]          DOB/AGE: 10/21/1940 / 71 y.o.  Logan Campbell, MD   Logan Code, PA-C [address] [place], [place], [place]  [address]                             [phone]   PROGRESS NOTE  Subjective:  negative for Chest Pain  negative for Shortness of Breath  negative for Nausea/Vomiting   negative for Calf Pain  positive for Bowel Movement   Tolerating Diet: yes         Patient reports pain as mild.    Objective: Vital signs in last 24 hours:   Patient Vitals for the past 24 hrs:  BP Temp Temp src Pulse Resp SpO2  06/21/11 1700 165/71 mmHg - - 77  17  99 %  06/21/11 1600 151/73 mmHg 98.7 F (37.1 C) Oral 68  13  97 %  06/21/11 1400 148/69 mmHg - - 74  16  99 %  06/21/11 1200 159/69 mmHg 98.2 F (36.8 C) Oral 69  14  98 %  06/21/11 1018 - - - 72  16  98 %  06/21/11 1010 149/76 mmHg - - 76  - -  06/21/11 1000 149/76 mmHg - - 77  17  99 %  06/21/11 0800 157/79 mmHg 98.4 F (36.9 C) Oral 79  18  99 %  06/21/11 0400 122/52 mmHg 98.8 F (37.1 C) Oral 75  16  97 %  06/21/11 0200 136/51 mmHg - - 78  15  100 %  06/21/11 0000 134/57 mmHg 98.9 F (37.2 C) Oral 76  16  100 %  06/20/11 2200 111/33 mmHg - - 79  15  97 %  06/20/11 2000 118/80 mmHg 98.7 F (37.1 C) Oral 83  15  97 %  06/20/11 1900 149/67 mmHg - - 61  17  98 %      Intake/Output from previous day:   03/17 0701 - 03/18 0700 In: 3090 [P.O.:840; I.V.:2200] Out: 4675 [Urine:3725]   Intake/Output this shift:   03/18 0701 - 03/18 1900 In: 1120 [P.O.:120; I.V.:1000] Out: 1900 [Urine:1750]   Intake/Output      03/17 0701 - 03/18 0700 03/18 0701 - 03/19 0700   P.O. 840 120   I.V. (mL/kg) 2200 (18.2) 1000 (8.3)   IV Piggyback 50    Total Intake(mL/kg) 3090 (25.6) 1120 (9.3)   Urine (mL/kg/hr) 3725 (1.3) 1750 (1.3)   Stool 950 150   Total Output 4675 1900   Net -1585 -780            LABORATORY DATA:  Basename 06/21/11 0444 06/20/11 0430 06/19/11 0400 06/18/11 0405 06/16/11 0445 06/15/11 0500  WBC 8.5 9.5 13.8* 9.4 10.8* 14.0*  HGB 8.4* 9.0* 10.1* 9.8* 9.2* 9.1*  HCT 24.9* 26.4* 29.3* 28.1* 26.4* 26.4*  PLT 242 214 236 178 154 129*    Basename 06/21/11 0444 06/20/11 0430 06/19/11 0400 06/18/11 0405 06/18/11 0313 06/17/11 0517 06/16/11 0445  NA 134* 134* 133* 131* 133* 137 132*  K 4.1 4.1 3.7 3.9 4.0 4.4 4.6  CL 103 104 101 100 102 105 102  CO2 20 20 19  18* 19 18* 18*  BUN 55* 60* 65* 68* 71* 74* 72*  CREATININE 3.18* 3.56* 3.81* 4.05* 4.04* 4.45* 4.90*  GLUCOSE 101* 83 68* 128* 142* 215* 200*  CALCIUM 8.6 8.9 9.5 9.7 9.6 9.9 10.1   Lab Results  Component Value Date   INR 1.19 06/12/2011   INR 1.99* 01/28/2009   INR 1.87* 01/27/2009    Examination:  General appearance: alert, cooperative, no distress and moderately obese  Wound Exam: draining   Drainage:  Moderate amount Serosanguinous exudate  Motor Exam:intact  Sensory Exam: Superficial Peroneal, Deep Peroneal and Tibial normal  Vascular Exam: Normal  Assessment:    9 Days Post-Op  Procedure(s) (LRB): IRRIGATION AND DEBRIDEMENT KNEE WITH POLY EXCHANGE (Left)  ADDITIONAL DIAGNOSIS:  Active Problems:  DM (diabetes mellitus), type 2 with complications  HYPERCHOLESTEROLEMIA  IIA  GOUT  ANEMIA, NORMOCYTIC, CHRONIC  DEPRESSION  HYPERTENSION, UNSPECIFIED  CAD, UNSPECIFIED SITE  HX, PERSONAL, MALIGNANCY, RECTUM/ANUS  Bronchitis  Infection of prosthetic left knee joint  ARF (acute renal failure)  CKD (chronic kidney disease) stage 3, GFR 30-59 ml/min  Morbid obesity  Leukocytosis  Hyponatremia  Staphylococcus aureus bacteremia  Atrial flutter, paroxysmal  Acute Blood Loss Anemia and Renal Insufficiency Acute   Plan: Physical Therapy as ordered Weight Bearing as Tolerated (WBAT)  DVT Prophylaxis:  Lovenox  DISCHARGE PLAN: not determined  DISCHARGE NEEDS: HHPT, HHRN, Walker and  IV Antibiotics   called to see pt now 9 days post op for infected Left TKA. Developed drainage from inferior wound last night.Has sero-sanguinous drainage. Aspirated knee under sterile technique with 1cc blood.Aspirated sub cutaneous area and retrieved 30cc thin bloody fluid sent for C&S. Drainage ceased and compression dressing applied.Probably related to lovenox.Monitor drainage-no change in meds .Continue with PT.      [name] W 06/21/2011, 6:26 PM

## 2011-06-22 LAB — CBC
MCHC: 34.3 g/dL (ref 30.0–36.0)
MCV: 89.9 fL (ref 78.0–100.0)
Platelets: 252 10*3/uL (ref 150–400)
RDW: 13.1 % (ref 11.5–15.5)
WBC: 7.8 10*3/uL (ref 4.0–10.5)

## 2011-06-22 LAB — BASIC METABOLIC PANEL
BUN: 46 mg/dL — ABNORMAL HIGH (ref 6–23)
Calcium: 8.7 mg/dL (ref 8.4–10.5)
Chloride: 106 mEq/L (ref 96–112)
Creatinine, Ser: 2.77 mg/dL — ABNORMAL HIGH (ref 0.50–1.35)
GFR calc Af Amer: 25 mL/min — ABNORMAL LOW (ref >90)
GFR calc non Af Amer: 21 mL/min — ABNORMAL LOW (ref >90)

## 2011-06-22 LAB — IRON AND TIBC
TIBC: 182 ug/dL — ABNORMAL LOW (ref 215–435)
UIBC: 167 ug/dL (ref 125–400)

## 2011-06-22 LAB — GLUCOSE, CAPILLARY: Glucose-Capillary: 186 mg/dL — ABNORMAL HIGH (ref 70–99)

## 2011-06-22 LAB — RETICULOCYTES: Retic Ct Pct: 1.9 % (ref 0.4–3.1)

## 2011-06-22 LAB — FERRITIN: Ferritin: 1366 ng/mL — ABNORMAL HIGH (ref 22–322)

## 2011-06-22 MED ORDER — CEFAZOLIN SODIUM 1-5 GM-% IV SOLN
1.0000 g | Freq: Three times a day (TID) | INTRAVENOUS | Status: DC
  Administered 2011-06-22 – 2011-06-24 (×6): 1 g via INTRAVENOUS
  Filled 2011-06-22 (×10): qty 50

## 2011-06-22 NOTE — Progress Notes (Signed)
Physical Therapy Treatment Patient Details Name: [name] MRN: [id number] DOB: 27-Sep-1940 Today's Date: 06/22/2011  PT Assessment/Plan  PT - Assessment/Plan Comments on Treatment Session: pt reports some dizziness in upright, but was motivated to get out of bed. He was directed to stand with RW, but he did not put both hands on walker.  Instead, he had left hand on rw., stood on right leg , reached for chair with right arm and swung hips into chair without control or safety.  Feel he will need +2 assist and more inpatient rehab at SNF prior to dd/c to home with HHPT PT Plan: Discharge plan needs to be updated PT Frequency: Min 4X/week Follow Up Recommendations: Skilled nursing facility Equipment Recommended: Defer to next venue PT Goals  Acute Rehab PT Goals PT Goal: Supine/Side to Sit - Progress: Progressing toward goal PT Goal: Sit to Supine/Side - Progress: Progressing toward goal PT Goal: Sit to Stand - Progress: Progressing toward goal PT Goal: Stand to Sit - Progress: Progressing toward goal  PT Treatment Precautions/Restrictions  Precautions Precautions: Knee Required Braces or Orthoses: No Restrictions Weight Bearing Restrictions: Yes LLE Weight Bearing: Weight bearing as tolerated Mobility (including Balance) Bed Mobility Bed Mobility: Yes Supine to Sit: 4: Min assist Sitting - Scoot to Edge of Bed: 4: Min assist Transfers Transfers: Yes Sit to Stand: 1: +2 Total assist;With upper extremity assist;From bed;Other (comment) (pt ~50%) Sit to Stand Details (indicate cue type and reason): pt did not put both hands on walker to stand.  He reached for chair and pivoted to chair Stand to Sit: 1: +2 Total assist;Other (comment) (pt ~50%) Stand to Sit Details: unsafe technique as pt did not put both hands on RW, only one hand and reached for chairarm to swing hips into chair Ambulation/Gait Ambulation/Gait: No Assistive device: Rolling walker Wheelchair  Mobility Wheelchair Mobility: No  Posture/Postural Control Posture/Postural Control: Postural limitations Postural Limitations: pt obese.  he maintains protective attitude for  left LE for mobility and directs people how to assist him Balance Balance Assessed: No Exercise    End of Session PT - End of Session Equipment Utilized During Treatment: Gait belt Activity Tolerance: Treatment limited secondary to medical complications (Comment) (pt c/o dizziness in upright) Patient left: in chair;with call bell in reach General Behavior During Session: [facility] for tasks performed Cognition: [facility] for tasks performed  Donnetta Hail 06/22/2011, 1:46 PM

## 2011-06-22 NOTE — Progress Notes (Signed)
CSW spoke with patients spouse, discussed need for SNF. Requesting [facility] if possible but is open to bed offers. FL2 completed and faxed out. CSW to follow for bed offers.  [name] C. Mersadez Linden MSW, LCSW [phone]

## 2011-06-22 NOTE — Progress Notes (Signed)
ANTIBIOTIC CONSULT NOTE - FOLLOW UP  Pharmacy Consult for Ancef Indication: MSSA bacteremia, prosthetic knee infection  Allergies  Allergen Reactions  . Fentanyl   . Hydromorphone Hcl   . Lorazepam   . Oxycodone-Acetaminophen     Patient Measurements: Height: 6' (182.9 cm) Weight: 265 lb 14 oz (120.6 kg) IBW/kg (Calculated) : 77.6    Vital Signs: Temp: 98 F (36.7 C) (03/19 0800) Temp src: Oral (03/19 0800) BP: 141/59 mmHg (03/19 0949) Pulse Rate: 83  (03/19 1100) Intake/Output from previous day: 03/18 0701 - 03/19 0700 In: 2980 [P.O.:480; I.V.:2350; IV Piggyback:150] Out: 4775 [Urine:4075; Stool:700] Intake/Output from this shift: Total I/O In: 450 [I.V.:400; IV Piggyback:50] Out: 520 [Urine:520]  Labs:  [facility] 06/22/11 0338 06/21/11 0444 06/20/11 0430  WBC 7.8 8.5 9.5  HGB 8.5* 8.4* 9.0*  PLT 252 242 214  LABCREA -- -- --  CREATININE 2.77* 3.18* 3.56*   Estimated Creatinine Clearance: 32.3 ml/min (by C-G formula based on Cr of 2.77).   Microbiology: 3/9 L knee joint: MSSA 3/9 R knee joint: ng 3/9 Blood: MSSA 3/9 L Knee Wound: MSSA 3/9 MRSA PCR negative 3/9 Sputum: moderate MSSA 3/10: Urine: NG 3/11: Blood x2: NGF 3/18: Knee (athrocentesis): pending     Anti-infectives     Start     Dose/Rate Route Frequency Ordered Stop   06/15/11 1300   ceFAZolin (ANCEF) IVPB 1 g/50 mL premix        1 g 100 mL/hr over 30 Minutes Intravenous Every 12 hours 06/15/11 1209     06/15/11 1230   rifampin (RIFADIN) capsule 600 mg        600 mg Oral Daily 06/15/11 1132     06/14/11 2100   vancomycin (VANCOCIN) 1,750 mg in sodium chloride 0.9 % 500 mL IVPB        1,750 mg 250 mL/hr over 120 Minutes Intravenous  Once 06/14/11 1950 06/14/11 2342   06/14/11 1630   rifampin (RIFADIN) capsule 600 mg  Status:  Discontinued        600 mg Oral Daily 06/14/11 1556 06/15/11 1003   06/14/11 1600   vancomycin (VANCOCIN) 1,750 mg in sodium chloride 0.9 % 500 mL IVPB  Status:   Discontinued        1,750 mg 250 mL/hr over 120 Minutes Intravenous Every 48 hours 06/13/11 1305 06/14/11 1323   06/13/11 1600   vancomycin (VANCOCIN) 1,500 mg in sodium chloride 0.9 % 500 mL IVPB  Status:  Discontinued        1,500 mg 250 mL/hr over 120 Minutes Intravenous Every 24 hours 06/12/11 1512 06/13/11 1304   06/12/11 1932   tobramycin (NEBCIN) powder  Status:  Discontinued          As needed 06/12/11 1933 06/12/11 2047   06/12/11 1928   vancomycin (VANCOCIN) powder  Status:  Discontinued          As needed 06/12/11 1928 06/12/11 2047   06/12/11 1600   vancomycin (VANCOCIN) 2,500 mg in sodium chloride 0.9 % 500 mL IVPB        2,500 mg 250 mL/hr over 120 Minutes Intravenous  Once 06/12/11 1511 06/12/11 1849   06/12/11 1500   cefTRIAXone (ROCEPHIN) 1 g in dextrose 5 % 50 mL IVPB  Status:  Discontinued        1 g 100 mL/hr over 30 Minutes Intravenous Every 24 hours 06/12/11 1445 06/14/11 1556          Assessment: Day #8 Ancef 1g  IV q12h Day #10 Rifampin 600mg  po daily ARF steadily improving, CrCl >30 ml/min  Goal of Therapy:  Plan at least 6 weeks IV therapy, then conversion to po regimen for 6 months.  Plan:   Increase Ancef 1gm IV q8h  Continue to follow renal function & adjust as needed  Follow up repeat knee aspirate cultures  Loralee Pacas, PharmD, BCPS Pager: [phone] 06/22/2011,11:22 AM

## 2011-06-22 NOTE — Progress Notes (Addendum)
Subjective: Patient oriented to person and place. No complaints. Has watery stool ostomy bag. No dyspnea, no chest pain.  Objective: Filed Vitals:   06/21/11 1900 06/21/11 2000 06/22/11 0000 06/22/11 0400  BP:  152/69 139/69 153/70  Pulse: 82 81 75 90  Temp:  97.9 F (36.6 C) 98.9 F (37.2 C)   TempSrc:  Oral Oral   Resp: 19 17 13 14   Height:      Weight:      SpO2: 99% 100% 97% 99%   Weight change:   Intake/Output Summary (Last 24 hours) at 06/22/11 0807 Last data filed at 06/22/11 0500  Gross per 24 hour  Intake   2560 ml  Output   4425 ml  Net  -1865 ml    General: Alert, awake, oriented 2, in no acute distress.  HEENT: No bruits, no goiter.  Heart: Regular rate and rhythm, without murmurs, rubs, gallops.  Lungs: Crackles left side, bilateral air movement.  Abdomen: Soft, nontender, nondistended, positive bowel sounds. Ostomy bag. Neuro: Grossly intact, nonfocal. Extremities: left knee with dressing.    Lab Results:  [facility] 06/22/11 0338 06/21/11 0444  NA 136 134*  K 4.5 4.1  CL 106 103  CO2 19 20  GLUCOSE 202* 101*  BUN 46* 55*  CREATININE 2.77* 3.18*  CALCIUM 8.7 8.6  MG -- --  PHOS -- --   Basename 06/22/11 0338 06/21/11 0444  WBC 7.8 8.5  NEUTROABS -- --  HGB 8.5* 8.4*  HCT 24.8* 24.9*  MCV 89.9 89.6  PLT 252 242     Basename 06/22/11 0338  VITAMINB12 --  FOLATE --  FERRITIN --  TIBC --  IRON --  RETICCTPCT 1.9    Micro Results: Recent Results (from the past 240 hour(s))  BODY FLUID CULTURE     Status: Normal   Collection Time   06/12/11 11:40 AM      Component Value Range Status Comment   Specimen Description KNEE JOINT LEFT   Final    Special Requests Normal   Final    Gram Stain     Final    Value: CYTOSPIN SLIDE WBC PRESENT,BOTH PMN AND MONONUCLEAR     ABUNDANT GRAM POSITIVE COCCI IN PAIRS     [id number]   Culture     Final    Value: ABUNDANT STAPHYLOCOCCUS AUREUS     Note: RIFAMPIN AND GENTAMICIN SHOULD NOT BE USED AS  SINGLE DRUGS FOR TREATMENT OF STAPH INFECTIONS.   Report Status 06/15/2011 FINAL   Final    Organism ID, Bacteria STAPHYLOCOCCUS AUREUS   Final   BODY FLUID CULTURE     Status: Normal   Collection Time   06/12/11 12:10 PM      Component Value Range Status Comment   Specimen Description KNEE JOINT RIGHT   Final    Special Requests NONE   Final    Gram Stain     Final    Value: CYTOSPIN SLIDE WBC PRESENT, PREDOMINANTLY PMN     NO ORGANISMS SEEN   Culture NO GROWTH 3 DAYS   Final    Report Status 06/16/2011 FINAL   Final   CULTURE, BLOOD (ROUTINE X 2)     Status: Normal   Collection Time   06/12/11  3:00 PM      Component Value Range Status Comment   Specimen Description BLOOD RIGHT ARM  5 ML IN [facility] BOTTLE   Final    Special Requests NONE   Final  Culture  Setup Time [id number]   Final    Culture     Final    Value: STAPHYLOCOCCUS AUREUS     Note: RIFAMPIN AND GENTAMICIN SHOULD NOT BE USED AS SINGLE DRUGS FOR TREATMENT OF STAPH INFECTIONS. SUSCEPTIBILITIES PERFORMED ON PREVIOUS CULTURE WITHIN THE LAST 5 DAYS.     Note: Gram Stain Report Called to,Read Back By and Verified With: RN M. STOCKS ON 06/13/11 AT 2005 BY TEDAR   Report Status 06/15/2011 FINAL   Final   CULTURE, BLOOD (ROUTINE X 2)     Status: Normal   Collection Time   06/12/11  3:10 PM      Component Value Range Status Comment   Specimen Description BLOOD LEFT ARM  3 ML IN AEROBIC ONLY   Final    Special Requests NONE   Final    Culture  Setup Time [id number]   Final    Culture     Final    Value: STAPHYLOCOCCUS AUREUS     Note: RIFAMPIN AND GENTAMICIN SHOULD NOT BE USED AS SINGLE DRUGS FOR TREATMENT OF STAPH INFECTIONS.     Note: Gram Stain Report Called to,Read Back By and Verified With: RN M. STOCKS ON 06/13/11 AT 2005 BY TEDAR   Report Status 06/15/2011 FINAL   Final    Organism ID, Bacteria STAPHYLOCOCCUS AUREUS   Final   MRSA PCR SCREENING     Status: Normal   Collection Time   06/12/11  5:46 PM      Component  Value Range Status Comment   MRSA by PCR NEGATIVE  NEGATIVE  Final   ANAEROBIC CULTURE     Status: Normal   Collection Time   06/12/11  7:40 PM      Component Value Range Status Comment   Specimen Description WOUND LEFT KNEE   Final    Special Requests NONE   Final    Gram Stain     Final    Value: FEW WBC PRESENT, PREDOMINANTLY PMN     NO SQUAMOUS EPITHELIAL CELLS SEEN     MODERATE GRAM POSITIVE COCCI IN PAIRS   Culture NO ANAEROBES ISOLATED   Final    Report Status 06/18/2011 FINAL   Final   WOUND CULTURE     Status: Normal   Collection Time   06/12/11  7:40 PM      Component Value Range Status Comment   Specimen Description WOUND LEFT KNEE   Final    Special Requests NONE   Final    Gram Stain     Final    Value: RARE WBC PRESENT, PREDOMINANTLY PMN     NO SQUAMOUS EPITHELIAL CELLS SEEN     FEW GRAM POSITIVE COCCI IN PAIRS   Culture     Final    Value: MODERATE STAPHYLOCOCCUS AUREUS     Note: RIFAMPIN AND GENTAMICIN SHOULD NOT BE USED AS SINGLE DRUGS FOR TREATMENT OF STAPH INFECTIONS.   Report Status 06/15/2011 FINAL   Final    Organism ID, Bacteria STAPHYLOCOCCUS AUREUS   Final   CULTURE, RESPIRATORY     Status: Normal   Collection Time   06/12/11 11:50 PM      Component Value Range Status Comment   Specimen Description SPUTUM   Final    Special Requests NONE TRACHAEL ASPPIRATE   Final    Gram Stain     Final    Value: NO WBC SEEN     NO SQUAMOUS EPITHELIAL CELLS SEEN  NO ORGANISMS SEEN   Culture     Final    Value: MODERATE STAPHYLOCOCCUS AUREUS     Note: RIFAMPIN AND GENTAMICIN SHOULD NOT BE USED AS SINGLE DRUGS FOR TREATMENT OF STAPH INFECTIONS.   Report Status 06/16/2011 FINAL   Final    Organism ID, Bacteria STAPHYLOCOCCUS AUREUS   Final   URINE CULTURE     Status: Normal   Collection Time   06/13/11  5:47 AM      Component Value Range Status Comment   Specimen Description URINE, CATHETERIZED   Final    Special Requests NONE   Final    Culture  Setup Time  [id number]   Final    Colony Count NO GROWTH   Final    Culture NO GROWTH   Final    Report Status 06/14/2011 FINAL   Final   CULTURE, BLOOD (ROUTINE X 2)     Status: Normal   Collection Time   06/14/11  4:30 PM      Component Value Range Status Comment   Specimen Description BLOOD RIGHT HAND   Final    Special Requests BOTTLES DRAWN AEROBIC AND ANAEROBIC 10CC   Final    Culture  Setup Time [id number]   Final    Culture NO GROWTH 5 DAYS   Final    Report Status 06/21/2011 FINAL   Final   CULTURE, BLOOD (ROUTINE X 2)     Status: Normal   Collection Time   06/14/11  4:45 PM      Component Value Range Status Comment   Specimen Description BLOOD RIGHT ARM   Final    Special Requests BOTTLES DRAWN AEROBIC AND ANAEROBIC 10CC   Final    Culture  Setup Time [id number]   Final    Culture NO GROWTH 5 DAYS   Final    Report Status 06/21/2011 FINAL   Final   WOUND CULTURE     Status: Normal (Preliminary result)   Collection Time   06/21/11  6:36 PM      Component Value Range Status Comment   Specimen Description WOUND KNEE   Final    Special Requests NONE   Final    Gram Stain     Final    Value: ABUNDANT WBC PRESENT, PREDOMINANTLY PMN     NO SQUAMOUS EPITHELIAL CELLS SEEN     RARE GRAM POSITIVE COCCI IN PAIRS   Culture PENDING   Incomplete    Report Status PENDING   Incomplete     Studies/Results: No results found.  Medications: I have reviewed the patient's current medications. 71 year old admitted March 9 with septic joint, Pt was admitted to [facility] and underwent I/D on the L knee by orthopedics 3/10 and remained intubated after the procedure due to low O2 sats. CXR post procedure showed selective intubation of the R mainstem bronchus and the tube was pulled out a few cm and the L lung reexpanded. PCCM service was consulted for further vent management due to acute resp failure. Subsequently patient was extubated. His care was transfer to [facility]. Patient has developed also acute renal  failure thought to be secondary to ATN. Renal function has improved with IV fluids.    1-Staphylococcus aureus bacteremia (06/14/2011)  Continue with rifampin and Cefazolin day 11 of 6 weeks.  repeated blood culture result from 3-11 , no growth to date.  Patient had  tunnel PICC IJ place for IV antibiotics to avoid peripheral PICC line due to renal function.  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 Might need to adjust Cefazolin for renal function.  Appreciate Dr Orvan Falconer help. Patient had arthrocentesis perform again on 3-18 duet to bleeding from knee. Will need to follow up re culture.   3-ARF (acute renal failure) on CKD (chronic kidney disease) stage 3, GFR  Acute on chronic renal failure, ATN probably in setting of infection. Renal US negative for hydronephrosis.  Cr trending down. Good urine output . Continue with IV fluids. Daily B-met. Monitor for overload Ef 45 %, diastolic dysfunction.  Cr decrease to 2.7.   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 CBG in the 100. Monitor CBG. Consider restart lantus at lower dose.   5-HYPERCHOLESTEROLEMIA IIA (07/16/2008) Continue with statin.   6-ANEMIA, NORMOCYTIC, CHRONIC (07/16/2008) Hb stable over last 2 days.  Had some blood loss from left knee. Anemia panel pending.   7-HYPERTENSION, UNSPECIFIED (07/16/2008) Continue with Cardizem and PRN Hydralazine.   8-CAD, UNSPECIFIED SITE (07/16/2008) Continue with aspirin.   9-Leukocytosis (06/12/2011) Secondary to infection. Resolved.   10-Hyponatremia (06/12/2011)  Setting renal failure. Resolved with IV fluids.   11-ECHO 3-13, Diastolic dysfunction and systolic.  Compensated.  12-Tachycardia// A Flutter: Cardizem to PO. Continue with metoprolol. Appreciate cardio. No anticoagulation.  13-Encephalopathy: Confusion, fluctuate. CT head ordered. ? Medications, delirium. Less likely uremia, renal  function improving.Hold paroxetine. trazodone to help him with sleep.  14-GI: monitor for diarrhea. If increase watery out put from ostomy consider check for S diff.        LOS: 10 days   Ethelbert Thain M.D.  [facility] 06/22/2011, 8:07 AM

## 2011-06-23 LAB — GLUCOSE, CAPILLARY
Glucose-Capillary: 146 mg/dL — ABNORMAL HIGH (ref 70–99)
Glucose-Capillary: 208 mg/dL — ABNORMAL HIGH (ref 70–99)
Glucose-Capillary: 189 mg/dL — ABNORMAL HIGH (ref 70–99)

## 2011-06-23 LAB — CBC
Hemoglobin: 7.8 g/dL — ABNORMAL LOW (ref 13.0–17.0)
MCH: 30.7 pg (ref 26.0–34.0)
MCHC: 34.1 g/dL (ref 30.0–36.0)
MCV: 90.2 fL (ref 78.0–100.0)
RBC: 2.54 MIL/uL — ABNORMAL LOW (ref 4.22–5.81)

## 2011-06-23 LAB — BASIC METABOLIC PANEL
BUN: 37 mg/dL — ABNORMAL HIGH (ref 6–23)
CO2: 20 mEq/L (ref 19–32)
Calcium: 8.8 mg/dL (ref 8.4–10.5)
Creatinine, Ser: 2.48 mg/dL — ABNORMAL HIGH (ref 0.50–1.35)
GFR calc non Af Amer: 24 mL/min — ABNORMAL LOW (ref >90)
Glucose, Bld: 140 mg/dL — ABNORMAL HIGH (ref 70–99)

## 2011-06-23 MED ORDER — LORAZEPAM 0.5 MG PO TABS
0.5000 mg | ORAL_TABLET | Freq: Three times a day (TID) | ORAL | Status: DC | PRN
  Administered 2011-06-23: 0.5 mg via ORAL
  Filled 2011-06-23: qty 1

## 2011-06-23 MED ORDER — INSULIN GLARGINE 100 UNIT/ML ~~LOC~~ SOLN
10.0000 [IU] | Freq: Every day | SUBCUTANEOUS | Status: DC
  Administered 2011-06-23: 10 [IU] via SUBCUTANEOUS

## 2011-06-23 NOTE — Progress Notes (Signed)
Provided Pt and wife bed offers.  CSW to continue to follow.  Providence Crosby, LCSWA Clinical Social Work [phone]

## 2011-06-23 NOTE — Progress Notes (Signed)
Patient ID: ORA MCNATT, male   DOB: 1941/02/15, 71 y.o.   MRN: [id number]  INFECTIOUS DISEASE PROGRESS NOTE    Date of Admission:  06/12/2011           Day 12 antibiotics  Subjective: He denies any pain in his left knee. He states he is eager to go home.  Objective: Temp:  [97.8 F (36.6 C)-99.1 F (37.3 C)] 98.9 F (37.2 C) (03/20 0542) Pulse Rate:  [67-83] 78  (03/20 0542) Resp:  [14-18] 16  (03/20 0542) BP: (135-169)/(59-78) 156/78 mmHg (03/20 0542) SpO2:  [93 %-100 %] 98 % (03/20 0542) Weight:  [111.131 kg (245 lb)] 111.131 kg (245 lb) (03/19 1500)  General: Seems he is still intermittently confused. Currently he is alert, talkative and appropriate with his brother in room His left knee is wrapped in gauze dressing. There is some serosanguineous drainage from the lower portion of his incision  Microbiology: 3/18 left knee aspirate Gram stain shows gram-positive cocci in pairs. Culture is negative to date.  Assessment: He has developed some left knee incision and drainage. The organism seen on stain may be nonviable remnants from his joint infection but will need to wait on final culture. I will continue cefazolin and rifampin for now.  Plan: 1. Continue cefazolin and rifampin 2. Check final cultures   Cliffton Asters, MD [facility] [phone] pager   [phone] cell 06/23/2011, 10:51 AM

## 2011-06-23 NOTE — Progress Notes (Signed)
Upon bedside report pt found sitting at end of bed naked having urinated on floor. Pt states he knows where his but will not verbalize answers. Two person assist back to clean dry bed. Pt states he is fine, and wants to rest. Will observe closely for signs of delirium. Bed alarm on, door open.

## 2011-06-23 NOTE — Progress Notes (Signed)
Request provider to order PRN anxiety med as pt is irritable and moderately difficult to communicate with. I overheard pt telling his wife he is on edge and he told writer "I need a beer". Reassured. Will follow-up closely.

## 2011-06-23 NOTE — Progress Notes (Signed)
Spouse present and request to speak with SW, as she wants information on SNF ([facility] if his [facility] is accepted there). Left message for SW to call nurse.

## 2011-06-23 NOTE — Progress Notes (Signed)
Subjective: Sitting up in bed talking on phone. Denies pain/discomfort  States"i am ready to go home"  Objective: Vital signs Filed Vitals:   06/22/11 1430 06/22/11 1500 06/22/11 2140 06/23/11 0542  BP:  135/59 169/77 156/78  Pulse: 67 70 78 78  Temp:  97.8 F (36.6 C) 99.1 F (37.3 C) 98.9 F (37.2 C)  TempSrc:  Oral Oral Oral  Resp: 14 15 16 16   Height:  6' (1.829 m)    Weight:  111.131 kg (245 lb)    SpO2: 99% 100% 97% 98%   Weight change:  Last BM Date: 06/22/11  Intake/Output from previous day: 03/19 0701 - 03/20 0700 In: 1140 [P.O.:240; I.V.:800; IV Piggyback:100] Out: 2745 [Urine:1945; Stool:800]     Physical Exam: General: Alert, awake, oriented x3, in no acute distress. Slightly irritable HEENT: No bruits, no goiter.PERRL mucus membranes slightly dry/pink Heart: Regular rate and rhythm, without murmurs, rubs, gallops. Lungs:Normal effort. Fine crackles in bases  Otherwise  Breath sounds are clear to auscultation bilaterally. No wheeze Abdomen: Obese, Soft, nontender, nondistended, positive bowel sounds. Ostomy bag intact.  Extremities: No clubbing cyanosis  with positive pedal pulses. Trace -1+LEE. Dressing to left knee with small amount serous drainage. Currently dry.  Neuro: Grossly intact, nonfocal. Cranial nerve II-XII intact.    Lab Results: Basic Metabolic Panel:  Basename 06/23/11 0415 06/22/11 0338  NA 135 136  K 4.4 4.5  CL 105 106  CO2 20 19  GLUCOSE 140* 202*  BUN 37* 46*  CREATININE 2.48* 2.77*  CALCIUM 8.8 8.7  MG -- --  PHOS -- --   Liver Function Tests: No results found for this basename: AST:2,ALT:2,ALKPHOS:2,BILITOT:2,PROT:2,ALBUMIN:2 in the last 72 hours No results found for this basename: LIPASE:2,AMYLASE:2 in the last 72 hours No results found for this basename: AMMONIA:2 in the last 72 hours CBC:  Basename 06/23/11 0415 06/22/11 0338  WBC 8.0 7.8  NEUTROABS -- --  HGB 7.8* 8.5*  HCT 22.9* 24.8*  MCV 90.2 89.9  PLT 300  252   Cardiac Enzymes: No results found for this basename: CKTOTAL:3,CKMB:3,CKMBINDEX:3,TROPONINI:3 in the last 72 hours BNP: No results found for this basename: PROBNP:3 in the last 72 hours D-Dimer: No results found for this basename: DDIMER:2 in the last 72 hours CBG:  Basename 06/23/11 0801 06/22/11 2139 06/22/11 1708 06/22/11 1143 06/22/11 0724 06/21/11 2136  GLUCAP 132* 159* 118* 273* 186* 241*   Hemoglobin A1C: No results found for this basename: HGBA1C in the last 72 hours Fasting Lipid Panel: No results found for this basename: CHOL,HDL,LDLCALC,TRIG,CHOLHDL,LDLDIRECT in the last 72 hours Thyroid Function Tests: No results found for this basename: TSH,T4TOTAL,FREET4,T3FREE,THYROIDAB in the last 72 hours Anemia Panel:  Basename 06/22/11 0338  VITAMINB12 928*  FOLATE >20.0  FERRITIN 1366*  TIBC 182*  IRON 15*  RETICCTPCT 1.9   Coagulation: No results found for this basename: LABPROT:2,INR:2 in the last 72 hours Urine Drug Screen: Drugs of Abuse  No results found for this basename: labopia,  cocainscrnur,  labbenz,  amphetmu,  thcu,  labbarb    Alcohol Level: No results found for this basename: ETH:2 in the last 72 hours Urinalysis: No results found for this basename: COLORURINE:2,APPERANCEUR:2,LABSPEC:2,PHURINE:2,GLUCOSEU:2,HGBUR:2,BILIRUBINUR:2,KETONESUR:2,PROTEINUR:2,UROBILINOGEN:2,NITRITE:2,LEUKOCYTESUR:2 in the last 72 hours Misc. Labs:  Recent Results (from the past 240 hour(s))  CULTURE, BLOOD (ROUTINE X 2)     Status: Normal   Collection Time   06/14/11  4:30 PM      Component Value Range Status Comment   Specimen Description BLOOD RIGHT HAND  Final    Special Requests BOTTLES DRAWN AEROBIC AND ANAEROBIC 10CC   Final    Culture  Setup Time [id number]   Final    Culture NO GROWTH 5 DAYS   Final    Report Status 06/21/2011 FINAL   Final   CULTURE, BLOOD (ROUTINE X 2)     Status: Normal   Collection Time   06/14/11  4:45 PM      Component Value Range  Status Comment   Specimen Description BLOOD RIGHT ARM   Final    Special Requests BOTTLES DRAWN AEROBIC AND ANAEROBIC 10CC   Final    Culture  Setup Time [id number]   Final    Culture NO GROWTH 5 DAYS   Final    Report Status 06/21/2011 FINAL   Final   WOUND CULTURE     Status: Normal (Preliminary result)   Collection Time   06/21/11  6:36 PM      Component Value Range Status Comment   Specimen Description WOUND KNEE   Final    Special Requests NONE   Final    Gram Stain     Final    Value: ABUNDANT WBC PRESENT, PREDOMINANTLY PMN     NO SQUAMOUS EPITHELIAL CELLS SEEN     RARE GRAM POSITIVE COCCI IN PAIRS   Culture NO GROWTH 1 DAY   Final    Report Status PENDING   Incomplete     Studies/Results: Ir US Guide Vasc Access Right  06/22/2011  *RADIOLOGY REPORT*  Clinical Data: Infection  PICC LINE PLACEMENT WITH ULTRASOUND AND FLUOROSCOPIC  GUIDANCE  Fluoroscopy Time: 0.2 minutes.  The right neck was prepped with chlorhexidine, draped in the usual sterile fashion using maximum barrier technique (cap and mask, sterile gown, sterile gloves, large sterile sheet, hand hygiene and cutaneous antisepsis) and infiltrated locally with 1% Lidocaine.  Ultrasound demonstrated patency of the right internal jugular vein, and this was documented with an image. A long subcutaneous tract was employed prior to puncture of the vein.  Under real-time ultrasound guidance, this vein was accessed with a 21 gauge micropuncture needle and image documentation was performed.  The needle was exchanged over a guidewire for a peel-away sheath through which a six [place] double lumen tunneled the PICC trimmed to 18 cm was advanced, positioned with its tip at the lower SVC/right atrial junction. The cuff was positioned in the subcutaneous tract.  Fluoroscopy during the procedure and fluoro spot radiograph confirms appropriate catheter position.  The catheter was flushed, secured to the skin with Prolene sutures, and covered with a  sterile dressing.  Complications:  None.  IMPRESSION: Successful  arm PICC line placement with ultrasound and fluoroscopic guidance.  The catheter is ready for use.  Original Report Authenticated By: Donavan Burnet, M.D.   Ir Central Line Right  06/22/2011  *RADIOLOGY REPORT*  Clinical Data: Infection  PICC LINE PLACEMENT WITH ULTRASOUND AND FLUOROSCOPIC  GUIDANCE  Fluoroscopy Time: 0.2 minutes.  The right neck was prepped with chlorhexidine, draped in the usual sterile fashion using maximum barrier technique (cap and mask, sterile gown, sterile gloves, large sterile sheet, hand hygiene and cutaneous antisepsis) and infiltrated locally with 1% Lidocaine.  Ultrasound demonstrated patency of the right internal jugular vein, and this was documented with an image. A long subcutaneous tract was employed prior to puncture of the vein.  Under real-time ultrasound guidance, this vein was accessed with a 21 gauge micropuncture needle and image documentation was performed.  The needle was  exchanged over a guidewire for a peel-away sheath through which a six [place] double lumen tunneled the PICC trimmed to 18 cm was advanced, positioned with its tip at the lower SVC/right atrial junction. The cuff was positioned in the subcutaneous tract.  Fluoroscopy during the procedure and fluoro spot radiograph confirms appropriate catheter position.  The catheter was flushed, secured to the skin with Prolene sutures, and covered with a sterile dressing.  Complications:  None.  IMPRESSION: Successful  arm PICC line placement with ultrasound and fluoroscopic guidance.  The catheter is ready for use.  Original Report Authenticated By: Donavan Burnet, M.D.    Medications: Scheduled Meds:    . aspirin  81 mg Oral Daily  . ezetimibe  10 mg Oral QHS   And  . atorvastatin  20 mg Oral QHS  .  ceFAZolin (ANCEF) IV  1 g Intravenous Q8H  . diltiazem  240 mg Oral Daily  . enoxaparin  40 mg Subcutaneous Q24H  . insulin aspart  0-20  Units Subcutaneous TID WC  . metoprolol tartrate  25 mg Oral BID  . mulitivitamin with minerals  1 tablet Oral Daily  . pantoprazole  40 mg Oral Q1200  . rifampin  600 mg Oral Daily  . DISCONTD:  ceFAZolin (ANCEF) IV  1 g Intravenous Q12H   Continuous Infusions:    . sodium chloride 100 mL/hr at 06/23/11 0317   PRN Meds:.acetaminophen, acetaminophen, hydrALAZINE, HYDROcodone-acetaminophen, levalbuterol, ondansetron (ZOFRAN) IV, ondansetron, polyethylene glycol, traZODone  Assessment/Plan:  Active Problems:  DM (diabetes mellitus), type 2 with complications  HYPERCHOLESTEROLEMIA  IIA  GOUT  ANEMIA, NORMOCYTIC, CHRONIC  DEPRESSION  HYPERTENSION, UNSPECIFIED  CAD, UNSPECIFIED SITE  HX, PERSONAL, MALIGNANCY, RECTUM/ANUS  Bronchitis  Infection of prosthetic left knee joint  ARF (acute renal failure)  CKD (chronic kidney disease) stage 3, GFR 30-59 ml/min  Morbid obesity  Leukocytosis  Hyponatremia  Staphylococcus aureus bacteremia  Atrial flutter, paroxysmal 1-Staphylococcus aureus bacteremia (06/14/2011)  Continue with rifampin and Cefazolin day 12 of 6 weeks. Repeated blood culture result from 3-11 , no growth to date. Awaiting final culture.  Appreciate ID assistance. Patient had tunnel PICC IJ place for IV antibiotics to avoid peripheral PICC line due to renal function.  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 Appreciate Dr Orvan Falconer help. Patient had arthrocentesis perform again on 3-18 duet to bleeding from knee. Will need to follow up re culture.  3-ARF (acute renal failure) on CKD (chronic kidney disease) stage 3, GFR  Acute on chronic renal failure, ATN probably in setting of infection. Improving.  Renal US negative for hydronephrosis. Cr trending down. Good urine output . Continue with IV fluids but will decrease rate. Monitor for overload Ef 45 %, diastolic dysfunction. Cr decrease to 2.4> 2.7.  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 CBG 118-159. Will  Restart home lantus at lower dose.  HgA1c 5.7 monitor  5-HYPERCHOLESTEROLEMIA IIA (07/16/2008) Continue with statin.   6-ANEMIA, NORMOCYTIC, CHRONIC (07/16/2008) Hb 7.8>8.5. Had some blood loss from left knee. Anemia panel iron15 an ferritin 1366. Monitor closely  7-HYPERTENSION, UNSPECIFIED (07/16/2008)  Fair control. Continue with Cardizem and PRN Hydralazine.       LOS: 11 days   [facility] M 06/23/2011, 11:01 AM

## 2011-06-23 NOTE — Progress Notes (Signed)
Physical Therapy Treatment Patient Details Name: [name] MRN: [id number] DOB: 30-Jul-1940 Today's Date: 06/23/2011  PT Assessment/Plan  PT - Assessment/Plan Comments on Treatment Session: Pt was very irritable this afternoon upon thearpy arriving at pts room.  Pt states that 'I have been up walking around the room all morning."  Pt did agree to take some steps from bed to chair.  Noted pt frustration with drainage coming from bandage.  Pt refused to perform exercises stating "I do them in my room all the time."     PT Plan: Discharge plan needs to be updated PT Frequency: Min 4X/week Follow Up Recommendations: Skilled nursing facility Equipment Recommended: Defer to next venue PT Goals  Acute Rehab PT Goals PT Goal Formulation: With patient/family Time For Goal Achievement: 2 weeks Pt will go Supine/Side to Sit: with modified independence PT Goal: Supine/Side to Sit - Progress: Updated due to goal met Pt will go Sit to Stand: with min assist PT Goal: Sit to Stand - Progress: Progressing toward goal Pt will go Stand to Sit: with min assist PT Goal: Stand to Sit - Progress: Progressing toward goal Pt will Ambulate: 16 - 50 feet;with min assist;with rolling walker PT Goal: Ambulate - Progress: Progressing toward goal  PT Treatment Precautions/Restrictions  Precautions Precautions: Knee Required Braces or Orthoses: No Restrictions Weight Bearing Restrictions: Yes LLE Weight Bearing: Weight bearing as tolerated Mobility (including Balance) Bed Mobility Bed Mobility: Yes Supine to Sit: 5: Supervision Supine to Sit Details (indicate cue type and reason): Supervision and cues for safety.  Sitting - Scoot to Edge of Bed: 5: Supervision Sitting - Scoot to Medley of Bed Details (indicate cue type and reason): Supervision and cues for safety.  Transfers Transfers: Yes Sit to Stand: 1: +2 Total assist;Patient percentage (comment);From elevated surface;With upper extremity assist;From  bed Sit to Stand Details (indicate cue type and reason): Pt assist 65%.  Cues for hand on bed and cues for safety due to pt very irritable towards therapist/tech when assist was provided.   Stand to Sit: 1: +2 Total assist;Patient percentage (comment);With upper extremity assist;With armrests;To chair/3-in-1 Stand to Sit Details: Pt assist 70%.  Assist for controlled descent with cues for hand placement.   Ambulation/Gait Ambulation/Gait: Yes Ambulation/Gait Assistance: 1: +2 Total assist;Patient percentage (comment) Ambulation/Gait Assistance Details (indicate cue type and reason): Pt assist 65%.  +2 for balance and safety with cues for sequencing/technique with RW and for safety.  Ambulation Distance (Feet): 5 Feet Assistive device: Rolling walker Gait Pattern: Step-to pattern;Decreased step length - right;Decreased step length - left;Trunk flexed Gait velocity: decreased Stairs: No Wheelchair Mobility Wheelchair Mobility: No    Exercise    End of Session PT - End of Session Activity Tolerance: Other (comment) (Drainage coming from bandage. ) Patient left: in chair;with call bell in reach Nurse Communication: Other (comment) (Notified RN of drainage from bandage) General Behavior During Session: Agitated Cognition: WFL for tasks performed  Page, Meribeth Mattes 06/23/2011, 3:38 PM

## 2011-06-23 NOTE — Progress Notes (Signed)
Patient seen and evaluated. Very calm and pleasant. I agree with exam assessment and plan.

## 2011-06-24 LAB — WOUND CULTURE: Culture: NO GROWTH

## 2011-06-24 LAB — GLUCOSE, CAPILLARY
Glucose-Capillary: 130 mg/dL — ABNORMAL HIGH (ref 70–99)
Glucose-Capillary: 185 mg/dL — ABNORMAL HIGH (ref 70–99)

## 2011-06-24 LAB — BASIC METABOLIC PANEL
CO2: 19 mEq/L (ref 19–32)
Calcium: 9.1 mg/dL (ref 8.4–10.5)
Chloride: 103 mEq/L (ref 96–112)
Potassium: 4.5 mEq/L (ref 3.5–5.1)
Sodium: 133 mEq/L — ABNORMAL LOW (ref 135–145)

## 2011-06-24 LAB — CBC
Hemoglobin: 7.8 g/dL — ABNORMAL LOW (ref 13.0–17.0)
MCH: 30.8 pg (ref 26.0–34.0)
Platelets: 344 10*3/uL (ref 150–400)
RBC: 2.53 MIL/uL — ABNORMAL LOW (ref 4.22–5.81)
WBC: 6.9 10*3/uL (ref 4.0–10.5)

## 2011-06-24 MED ORDER — LEVALBUTEROL HCL 0.63 MG/3ML IN NEBU: 0.6300 mg | INHALATION_SOLUTION | Freq: Four times a day (QID) | RESPIRATORY_TRACT | Status: DC | PRN

## 2011-06-24 MED ORDER — TRAZODONE 25 MG HALF TABLET: 25.0000 mg | ORAL_TABLET | Freq: Every evening | ORAL | Status: DC | PRN

## 2011-06-24 MED ORDER — LORAZEPAM 0.5 MG PO TABS: 0.5000 mg | ORAL_TABLET | Freq: Three times a day (TID) | ORAL | Status: AC | PRN

## 2011-06-24 MED ORDER — SODIUM CHLORIDE 0.9 % IJ SOLN
10.0000 mL | INTRAMUSCULAR | Status: DC | PRN
  Administered 2011-06-24 (×3): 10 mL

## 2011-06-24 MED ORDER — BENZONATATE 100 MG PO CAPS: 100.0000 mg | ORAL_CAPSULE | Freq: Three times a day (TID) | ORAL | Status: AC | PRN

## 2011-06-24 MED ORDER — RIFAMPIN 300 MG PO CAPS: 600.0000 mg | ORAL_CAPSULE | Freq: Every day | ORAL | Status: AC

## 2011-06-24 MED ORDER — CEFAZOLIN SODIUM 1-5 GM-% IV SOLN: 1.0000 g | Freq: Three times a day (TID) | INTRAVENOUS | Status: DC

## 2011-06-24 MED ORDER — INSULIN GLARGINE 100 UNIT/ML ~~LOC~~ SOLN: 10.0000 [IU] | Freq: Every day | SUBCUTANEOUS | Status: DC

## 2011-06-24 MED ORDER — ADULT MULTIVITAMIN W/MINERALS CH: 1.0000 | ORAL_TABLET | Freq: Every day | ORAL | Status: DC

## 2011-06-24 MED ORDER — HEPARIN SOD (PORK) LOCK FLUSH 100 UNIT/ML IV SOLN
250.0000 [IU] | INTRAVENOUS | Status: DC | PRN
  Administered 2011-06-24: 500 [IU]
  Filled 2011-06-24: qty 3

## 2011-06-24 MED ORDER — PANTOPRAZOLE SODIUM 40 MG PO TBEC: 40.0000 mg | DELAYED_RELEASE_TABLET | Freq: Every day | ORAL | Status: DC

## 2011-06-24 MED ORDER — HEPARIN SOD (PORK) LOCK FLUSH 100 UNIT/ML IV SOLN
250.0000 [IU] | Freq: Every day | INTRAVENOUS | Status: DC
  Filled 2011-06-24: qty 3

## 2011-06-24 MED ORDER — HYDROCODONE-ACETAMINOPHEN 5-325 MG PO TABS: 1.0000 | ORAL_TABLET | ORAL | Status: AC | PRN

## 2011-06-24 MED ORDER — METOPROLOL TARTRATE 25 MG PO TABS: 25.0000 mg | ORAL_TABLET | Freq: Two times a day (BID) | ORAL | Status: DC

## 2011-06-24 MED ORDER — DILTIAZEM HCL ER COATED BEADS 240 MG PO CP24: 240.0000 mg | ORAL_CAPSULE | Freq: Every day | ORAL | Status: DC

## 2011-06-24 MED ORDER — POLYETHYLENE GLYCOL 3350 17 G PO PACK: 17.0000 g | PACK | Freq: Every day | ORAL | Status: AC | PRN

## 2011-06-24 NOTE — Progress Notes (Signed)
Patient ID: [name], male   DOB: 08-Jan-1941, 71 y.o.   MRN: [id number] PATIENT ID: ANTAWAN [name]        MRN:  [id number]          DOB/AGE: 01/31/1941 / 71 y.o.   Logan Campbell, MD   Logan Code, PA-C [address] [place], [place], [place]  [address]                             [phone]   PROGRESS NOTE  Subjective:   Doing much better.  He states he is less painful and has walked twice today. Denies any fevers, shakes or chills.  Objective: Vital signs in last 24 hours:   Patient Vitals for the past 24 hrs:  BP Temp Temp src Pulse Resp SpO2  06/24/11 0548 160/100 mmHg 98.2 F (36.8 C) Oral 73  16  100 %  06/23/11 2203 - - - 66  - -  06/23/11 2108 147/69 mmHg 99.8 F (37.7 C) Oral 62  16  96 %  06/23/11 1430 136/80 mmHg 98 F (36.7 C) Oral 74  18  100 %      Intake/Output from previous day:   03/20 0701 - 03/21 0700 In: 4804.2 [P.O.:1180; I.V.:3474.2] Out: 3565 [Urine:2700]   Intake/Output this shift:   03/21 0701 - 03/21 1900 In: 240 [P.O.:240] Out: 500 [Urine:500]   Intake/Output      03/20 0701 - 03/21 0700 03/21 0701 - 03/22 0700   P.O. 1180 240   I.V. (mL/kg) 3474.2 (31.3)    IV Piggyback 150    Total Intake(mL/kg) 4804.2 (43.2) 240 (2.2)   Urine (mL/kg/hr) 2700 (1) 500 (0.7)   Stool 865    Total Output 3565 500   Net +1239.2 -260        Stool Occurrence 2 x       LABORATORY DATA:  Basename 06/24/11 0500 06/23/11 0415 06/22/11 0338 06/21/11 0444 06/20/11 0430 06/19/11 0400 06/18/11 0405  WBC 6.9 8.0 7.8 8.5 9.5 13.8* 9.4  HGB 7.8* 7.8* 8.5* 8.4* 9.0* 10.1* 9.8*  HCT 22.7* 22.9* 24.8* 24.9* 26.4* 29.3* 28.1*  PLT 344 300 252 242 214 236 178    Basename 06/24/11 0500 06/23/11 0415 06/22/11 0338 06/21/11 0444 06/20/11 0430 06/19/11 0400 06/18/11 0405  NA 133* 135 136 134* 134* 133* 131*  K 4.5 4.4 4.5 4.1 4.1 3.7 3.9  CL 103 105 106 103 104 101 100  CO2 19 20 19 20 20 19  18*  BUN 32* 37* 46* 55* 60* 65* 68*  CREATININE 2.32* 2.48* 2.77*  3.18* 3.56* 3.81* 4.05*  GLUCOSE 143* 140* 202* 101* 83 68* 128*  CALCIUM 9.1 8.8 8.7 8.6 8.9 9.5 9.7   Lab Results  Component Value Date   INR 1.19 06/12/2011   INR 1.99* 01/28/2009   INR 1.87* 01/27/2009    Examination:  Wound Exam: clean, dry, intact at this time.  Tried to express fluid but there wasn't any that came out of wound.  Wound minimally tender.  No signs of infection.  Some fluid subq but much less than this past Monday.  No calf pain.  No left ankle pain to palpation.  Drainage:  None: wound tissue dry, Dark bloody drainage on dressing from last night.  This drainage was from when he ambulated earlier today.  Motor Exam: EHL and FHL Intact  Sensory Exam: Superficial Peroneal, Deep Peroneal and Tibial intact  Assessment:    12 Days Post-Op  Procedure(s) (LRB): IRRIGATION AND DEBRIDEMENT KNEE WITH POLY EXCHANGE (Left)  ADDITIONAL DIAGNOSIS:  Active Problems:  DM (diabetes mellitus), type 2 with complications  HYPERCHOLESTEROLEMIA  IIA  GOUT  ANEMIA, NORMOCYTIC, CHRONIC  DEPRESSION  HYPERTENSION, UNSPECIFIED  CAD, UNSPECIFIED SITE  HX, PERSONAL, MALIGNANCY, RECTUM/ANUS  Bronchitis  Infection of prosthetic left knee joint  ARF (acute renal failure)  CKD (chronic kidney disease) stage 3, GFR 30-59 ml/min  Morbid obesity  Leukocytosis  Hyponatremia  Staphylococcus aureus bacteremia  Atrial flutter, paroxysmal  Plan: Physical Therapy as ordered Weight Bearing as Tolerated (WBAT)  DVT Prophylaxis:  Lovenox  DISCHARGE PLAN: Skilled Nursing Facility/Rehab  DISCHARGE NEEDS: CPM and Walker  Follow up in office on Monday March 25.        [name] 06/24/2011, 1:07 PM

## 2011-06-24 NOTE — Progress Notes (Signed)
   CARE MANAGEMENT NOTE 06/24/2011  Patient:  [name], [name]   Account Number:  [id number]  Date Initiated:  06/14/2011  Documentation initiated by:  Jiles Crocker  Subjective/Objective Assessment:   ADMITTED WITH BILATERAL KNEE PAIN     Action/Plan:   PCP IS DR HAMICK; LIVES WITH SPOUSE; REMAINS IN THE ICU ON VENT SUPPORT   Anticipated DC Date:  06/24/2011   Anticipated DC Plan:  SKILLED NURSING FACILITY  In-house referral  Clinical Social Worker         Choice offered to / List presented to:             Status of service:  Completed, signed off Medicare Important Message given?   (If response is "NO", the following Medicare IM given date fields will be blank) Date Medicare IM given:   Date Additional Medicare IM given:    Discharge Disposition:  SKILLED NURSING FACILITY  Per UR Regulation:  Reviewed for med. necessity/level of care/duration of stay  If discussed at Long Length of Stay Meetings, dates discussed:   06/23/2011    Comments:  06/24/11 [facility] RN,BSN NCM [phone] D/C SNF.  06/14/2011- Sofie Rower RN, BSN, MHA

## 2011-06-24 NOTE — Progress Notes (Signed)
PROGRESS NOTE  [name] ZOX:[id number] DOB: 1940/06/22 DOA: 06/12/2011 PCP: Ailene Ravel, MD, MD Orthopedic surgeon: [name], M.D. Cardiologist: Julien Nordmann, M.D. Infectious disease: Cliffton Asters, M.D.  Brief narrative: 71 year old man presents the emergency department with complaint of bilateral knee pain for 3 days. Date prior to admission the patient had joint aspiration left knee.  Patient underwent surgical debridement and failed extubation post procedure and therefore transferred to the critical care service. Developed atrial flutter with rapid ventricular response March 10. Eventually was transferred back to the hospitalist service. Seen by multiple consultants. Overall condition continues to improve and he will complete a six-week course of IV antibiotics with followup with cardiology and infectious disease as an outpatient as well as orthopedics.  Chart review:  Hospitalization 06/04/2011-06/08/2011: Acute bronchitis with associated chest pain, uncontrolled diabetes mellitus  Past medical history: Diabetes mellitus, depression, hypertension, hyperlipidemia, coronary artery disease, anemia of chronic disease, chronic kidney disease stage III, ulcerative colitis status post colectomy with colostomy bag, gout, left knee replacement 2009  Consultants:  Orthopedics  Critical care medicine attending March 9-March 13  Infectious disease  Cardiology  Interventional radiology  Physical therapy: Home health physical therapy versus skilled nursing facility  Procedures:  06/12/2011: Bilateral knee joint aspiration in the emergency department  06/12/2011: Incision, drainage, irrigation, debridement left total knee. Poly exchange.  06/14/2011: Central venous catheter insertion  06/16/2011: 2-D echocardiogram: Left ventricular ejection fraction 40-45%. Grade 2 diastolic dysfunction.  06/21/2011: Right IJV tunneled PICC placement  06/21/2011: Re-aspiration  left knee by orthopedics secondary to bloody drainage.  Antibiotics:  March 9-11: Vancomycin  March 9-10: Rocephin  March 11: Rifampin  March 12: Ancef  Interim History: Chart reviewed in detail and summarized as above. Time summarizing chart as above 60 minutes. Afebrile, vital signs stable. Capillary blood sugars well controlled.  Subjective: Feels good. No complaints. No left ankle pain. Does note some bloody drainage from his knee with ambulation.  Objective: Filed Vitals:   06/23/11 1430 06/23/11 2108 06/23/11 2203 06/24/11 0548  BP: 136/80 147/69  160/100  Pulse: 74 62 66 73  Temp: 98 F (36.7 C) 99.8 F (37.7 C)  98.2 F (36.8 C)  TempSrc: Oral Oral  Oral  Resp: 18 16  16   Height:      Weight:      SpO2: 100% 96%  100%    Intake/Output Summary (Last 24 hours) at 06/24/11 1013 Last data filed at 06/24/11 0842  Gross per 24 hour  Intake 4804.17 ml  Output   3665 ml  Net 1139.17 ml    Exam:   General:  Appears calm and comfortable.  Cardiovascular: Regular rate and rhythm. No murmur, rub, gallop.  Telemetry: Sinus rhythm. No acute changes.  Respiratory: Clear to auscultation bilaterally. No wheezes, rales, rhonchi. Normal respiratory effort.  Musculoskeletal: Left knee dressed.  Psychiatric: Grossly normal mood and affect. Speech fluent and appropriate.  Data Reviewed: Basic Metabolic Panel:  Lab 06/24/11 8119 06/23/11 0415 06/22/11 0338 06/21/11 0444 06/20/11 0430  NA 133* 135 136 134* 134*  K 4.5 4.4 -- -- --  CL 103 105 106 103 104  CO2 19 20 19 20 20   GLUCOSE 143* 140* 202* 101* 83  BUN 32* 37* 46* 55* 60*  CREATININE 2.32* 2.48* 2.77* 3.18* 3.56*  CALCIUM 9.1 8.8 8.7 8.6 8.9  MG -- -- -- -- --  PHOS -- -- -- -- --   CBC:  Lab 06/24/11 0500 06/23/11 0415 06/22/11 0338 06/21/11 0444 06/20/11  0430  WBC 6.9 8.0 7.8 8.5 9.5  NEUTROABS -- -- -- -- --  HGB 7.8* 7.8* 8.5* 8.4* 9.0*  HCT 22.7* 22.9* 24.8* 24.9* 26.4*  MCV 89.7 90.2 89.9  89.6 88.9  PLT 344 300 252 242 214   Cardiac Enzymes:  Lab 06/18/11 1745 06/18/11 1410 06/18/11 0640  CKTOTAL 18 19 22   CKMB 2.4 2.4 2.8  CKMBINDEX -- -- --  TROPONINI <0.30 <0.30 <0.30   CBG:  Lab 06/24/11 0818 06/24/11 0738 06/23/11 2109 06/23/11 1652 06/23/11 1145  GLUCAP 145* 130* 189* 208* 146*    Recent Results (from the past 240 hour(s))  CULTURE, BLOOD (ROUTINE X 2)     Status: Normal   Collection Time   06/14/11  4:30 PM      Component Value Range Status Comment   Specimen Description BLOOD RIGHT HAND   Final    Special Requests BOTTLES DRAWN AEROBIC AND ANAEROBIC 10CC   Final    Culture  Setup Time [id number]   Final    Culture NO GROWTH 5 DAYS   Final    Report Status 06/21/2011 FINAL   Final   CULTURE, BLOOD (ROUTINE X 2)     Status: Normal   Collection Time   06/14/11  4:45 PM      Component Value Range Status Comment   Specimen Description BLOOD RIGHT ARM   Final    Special Requests BOTTLES DRAWN AEROBIC AND ANAEROBIC 10CC   Final    Culture  Setup Time [id number]   Final    Culture NO GROWTH 5 DAYS   Final    Report Status 06/21/2011 FINAL   Final   WOUND CULTURE     Status: Normal   Collection Time   06/21/11  6:36 PM      Component Value Range Status Comment   Specimen Description WOUND KNEE   Final    Special Requests NONE   Final    Gram Stain     Final    Value: ABUNDANT WBC PRESENT, PREDOMINANTLY PMN     NO SQUAMOUS EPITHELIAL CELLS SEEN     RARE GRAM POSITIVE COCCI IN PAIRS   Culture NO GROWTH 2 DAYS   Final    Report Status 06/24/2011 FINAL   Final      Studies: Dg Ankle Complete Left  07/09/2011  *RADIOLOGY REPORT*  Clinical Data: Pain and swelling.  Previous surgery.  LEFT ANKLE COMPLETE - 3+ VIEW  Comparison: 08/17/2003  Findings: There has been fracture of the fibular plate to.  There are also fractures of the lowest two screws.  There is refracture or nonunion.  There is degenerative disease of the ankle joint with osteophytes, joint  effusion and small intra-articular loose bodies.  Talar dome appears smooth.  IMPRESSION: Fracture of the fibular plate and lowest two screws.  Refracture or nonunion of the bone.  Degenerative disease of the ankle joint with osteophyte formation, joint effusion and probably small intra-articular loose bodies.  Original Report Authenticated By: Thomasenia Sales, M.D.   Ct Head Wo Contrast  06/18/2011  *RADIOLOGY REPORT*  Clinical Data: Altered mental status.  Confusion.  CT HEAD WITHOUT CONTRAST  Technique:  Contiguous axial images were obtained from the base of the skull through the vertex without contrast.  Comparison: MRI of the brain dated 04/22/2007.  Findings: There is a well-defined focus of decreased attenuation immediately lateral to the head of the left caudate nucleus extending into the region of the left external  capsule, similar to an area of 02/02 hyperintensity on remote MRI of the brain dated 04/22/2007, indicative of the sequelae of old lacunar infarction. No definite acute intracranial abnormalities.  Specifically, no definite signs of acute/subacute ischemia, focal mass, mass effect, hydrocephalus or abnormal intra or extra-axial fluid collections. There are extensive regions of decreased attenuation throughout the deep and periventricular white matter of the cerebral hemispheres bilaterally, most consistent with chronic microvascular ischemic changes.  No displaced skull fractures are identified.  The mastoids are largely sclerosis bilaterally, and visualized portions of the paranasal sinuses are remarkable for areas of mild mucosal thickening throughout the ethmoids, with a small air-fluid level in the right maxillary sinus.  IMPRESSION: 1.  No definite acute intracranial abnormalities (please note that accurate assessment for subtle regions acute/subacute ischemia upon this background of chronic microvascular ischemic changes in the brain is exceedingly difficult). 2.  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 3.  Mild mucosal thickening in the ethmoid sinuses and small air fluid level in the right maxillary sinus.  Clinical correlation for signs of symptoms of acute sinusitis is recommended.  Original Report Authenticated By: Florencia Reasons, M.D.   US Renal  06/14/2011  *RADIOLOGY REPORT*  Clinical Data: Elevated creatinine.  RENAL/URINARY TRACT ULTRASOUND COMPLETE  Comparison:  MRI 07/30/2008, PET CT 03/18/2009  Findings:  Right Kidney:  10.0 cm.  No mass or hydronephrosis.  Left Kidney:  12.1 cm.  No mass or hydronephrosis.  Bladder:  The bladder is not visualized.  IMPRESSION: Normal-appearing kidneys.  Non visualized bladder.  Original Report Authenticated By: Patterson Hammersmith, M.D.   Scheduled Meds:   . aspirin  81 mg Oral Daily  . ezetimibe  10 mg Oral QHS   And  . atorvastatin  20 mg Oral QHS  .  ceFAZolin (ANCEF) IV  1 g Intravenous Q8H  . diltiazem  240 mg Oral Daily  . enoxaparin  40 mg Subcutaneous Q24H  . insulin aspart  0-20 Units Subcutaneous TID WC  . insulin glargine  10 Units Subcutaneous QHS  . metoprolol tartrate  25 mg Oral BID  . mulitivitamin with minerals  1 tablet Oral Daily  . pantoprazole  40 mg Oral Q1200  . rifampin  600 mg Oral Daily   Continuous Infusions:   . sodium chloride 75 mL/hr at 06/24/11 0325     Assessment/Plan: 1. 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 Weight bear as tolerated. Repeat knee culture no growth, final. Follow up with orthopedics. 2. MSSA bacteremia: Secondary to left prosthetic knee joint infection.  Repeat blood cultures no growth, final. 6 weeks combination antibiotic therapy IV cefazolin and oral rifampin, followed by several months of oral antibiotic therapy.  As his renal  function improves will need to be careful to adjust the dosing of his cefazolin.  3. Acute renal failure: Continued slow improvement. Likely secondary to acute infection. Continue to follow closely as an outpatient with repeat laboratory studies. 4. Atrial flutter with rapid ventricular response: Intermittent in nature. Probably related to underlying bacteremia. Oral Lopressor, oral diltiazem. No anti-coagulation as this condition is expected resolved. TSH within normal limits. 5. Anemia of acute illness: Stable. Followup as an outpatient. Supplemental iron. 6. Ischemic cardiomyopathy/systolic and diastolic dysfunction: No ACE inhibitors secondary to renal dysfunction. Continue beta blocker. 7. Acute encephalopathy: Resolved. Secondary  to acute infection. 8. Hyponatremia: Resolved. Thought to be secondary to acute renal failure. 9. Left ankle fracture: X-ray revealed fracture the fibular plate versus nonunion of bone. Followup with orthopedics as an outpatient. 10. Status post ventilatory-dependent respiratory failure postoperatively. 11. History of coronary artery disease: Stable. Continue aspirin. 12. Diabetes mellitus type 2: Well controlled. Hemoglobin A1c 5.7. Continue Lantus.  Code Status: Full code. Family Communication: None at bedside Disposition Plan: Medically stable for transfer to skilled nursing facility today.   Brendia Sacks, MD  [facility] Pager [phone] 06/24/2011, 10:13 AM    LOS: 12 days

## 2011-06-24 NOTE — Discharge Summary (Signed)
Patient seen and evaluated. See my progress note for further details. I agree with discharge today. Avoid nephrotoxins. Monitor blood sugars closely.

## 2011-06-24 NOTE — Progress Notes (Signed)
Physical Therapy Treatment Patient Details Name: [name] MRN: [id number] DOB: 1941-04-05 Today's Date: 06/24/2011  PT Assessment/Plan  PT - Assessment/Plan Comments on Treatment Session: Pt continues to be irritable throughout PT session.  Pt agreed to some ambulation, however refused to ambulate in hallway and requested to sit in recliner.  Pt also continues to state that he is doing his exercises in bed.  PT continues to recommend SNF for follow up therapy at D/C.  PT Plan: Discharge plan remains appropriate PT Frequency: Min 4X/week Follow Up Recommendations: Skilled nursing facility Equipment Recommended: Defer to next venue PT Goals  Acute Rehab PT Goals PT Goal Formulation: With patient/family Time For Goal Achievement: 2 weeks Pt will go Supine/Side to Sit: with modified independence PT Goal: Supine/Side to Sit - Progress: Progressing toward goal Pt will go Sit to Stand: with min assist PT Goal: Sit to Stand - Progress: Progressing toward goal Pt will go Stand to Sit: with min assist PT Goal: Stand to Sit - Progress: Progressing toward goal Pt will Ambulate: 16 - 50 feet;with min assist;with rolling walker PT Goal: Ambulate - Progress: Progressing toward goal  PT Treatment Precautions/Restrictions  Precautions Precautions: Knee Required Braces or Orthoses: No Restrictions Weight Bearing Restrictions: Yes LLE Weight Bearing: Weight bearing as tolerated Mobility (including Balance) Bed Mobility Bed Mobility: Yes Supine to Sit: 5: Supervision Supine to Sit Details (indicate cue type and reason): Supervision and cues for safety. Transfers Transfers: Yes Sit to Stand: 1: +2 Total assist;Patient percentage (comment);From elevated surface;With upper extremity assist;From bed Sit to Stand Details (indicate cue type and reason): Pt assist 70%.  +2 for safety with cues for hand placement and safety.  Stand to Sit: 1: +2 Total assist;Patient percentage (comment);With  upper extremity assist;With armrests;To chair/3-in-1 Stand to Sit Details: Pt assist 70%.  +2 for safety and line management with cues for hand placement and safety.   Ambulation/Gait Ambulation/Gait: Yes Ambulation/Gait Assistance: 1: +2 Total assist;Patient percentage (comment) Ambulation/Gait Assistance Details (indicate cue type and reason): Pt assist 65%.  +2 for safety and line management.  Requires max cuing for sequencing/technique due to pt tendency to step laterally outside of RW.   Ambulation Distance (Feet): 10 Feet Assistive device: Rolling walker Gait Pattern: Step-to pattern;Decreased step length - right;Decreased step length - left;Trunk flexed;Ataxic (Left toe out) Gait velocity: decreased Stairs: No Wheelchair Mobility Wheelchair Mobility: No    Exercise    End of Session PT - End of Session Equipment Utilized During Treatment: Gait belt Activity Tolerance: Other (comment) (Pt continues to have drainage from bandage when ambulating) Patient left: in chair;with call bell in reach Nurse Communication: Other (comment) (RN made aware of drainage. ) General Behavior During Session: Agitated Cognition: WFL for tasks performed  Page, Meribeth Mattes 06/24/2011, 10:47 AM

## 2011-06-24 NOTE — Progress Notes (Addendum)
Patient cleared for discharge. CSW spoke with patients wife. She is requesting [facility]. Patient accepted at [facility]. Packet copied and placed in [place]. ptar called for transportation. Wife at bedside informed.  Reeta Kuk C. Claudett Bayly MSW, LCSW [phone]

## 2011-06-24 NOTE — Discharge Summary (Signed)
Physician Discharge Summary  Patient ID: [name] MRN: [id number] DOB/AGE: June 15, 1940 71 y.o.  Admit date: 06/12/2011 Discharge date: 06/24/2011  Primary Care Physician:  Ailene Ravel, MD, MD   Discharge Diagnoses:    Active Problems:  DM (diabetes mellitus), type 2 with complications  HYPERCHOLESTEROLEMIA  IIA  GOUT  ANEMIA, NORMOCYTIC, CHRONIC  DEPRESSION  HYPERTENSION, UNSPECIFIED  CAD, UNSPECIFIED SITE  HX, PERSONAL, MALIGNANCY, RECTUM/ANUS  Bronchitis  Infection of prosthetic left knee joint  ARF (acute renal failure)  CKD (chronic kidney disease) stage 3, GFR 30-59 ml/min  Morbid obesity  Leukocytosis  Hyponatremia  Staphylococcus aureus bacteremia  Atrial flutter, paroxysmal   Medication List  As of 06/24/2011  2:34 PM   STOP taking these medications         exenatide 5 MCG/0.02ML Soln      insulin aspart 100 UNIT/ML injection      losartan 100 MG tablet      naproxen sodium 220 MG tablet      predniSONE 10 MG tablet         TAKE these medications         aspirin 81 MG tablet   Take 81 mg by mouth daily.      benzonatate 100 MG capsule   Commonly known as: TESSALON   Take 1 capsule (100 mg total) by mouth 3 (three) times daily as needed for cough.      ceFAZolin 1-5 GM-%   Commonly known as: ANCEF   Inject 50 mLs (1 g total) into the vein every 8 (eight) hours.      CINNAMON PO   Take 1,000 mg by mouth daily.      diltiazem 240 MG 24 hr capsule   Commonly known as: CARDIZEM CD   Take 1 capsule (240 mg total) by mouth daily.      ezetimibe-simvastatin 10-40 MG per tablet   Commonly known as: VYTORIN   Take 1 tablet by mouth at bedtime.      HYDROcodone-acetaminophen 5-325 MG per tablet   Commonly known as: NORCO   Take 1-2 tablets by mouth every 4 (four) hours as needed.      insulin glargine 100 UNIT/ML injection   Commonly known as: LANTUS   Inject 10 Units into the skin at bedtime.      levalbuterol 0.63 MG/3ML nebulizer  solution   Commonly known as: XOPENEX   Take 3 mLs (0.63 mg total) by nebulization every 6 (six) hours as needed for wheezing or shortness of breath.      LORazepam 0.5 MG tablet   Commonly known as: ATIVAN   Take 1 tablet (0.5 mg total) by mouth every 8 (eight) hours as needed for anxiety.      metoprolol tartrate 25 MG tablet   Commonly known as: LOPRESSOR   Take 1 tablet (25 mg total) by mouth 2 (two) times daily.      mulitivitamin with minerals Tabs   Take 1 tablet by mouth daily.      multivitamin tablet   Take 1 tablet by mouth daily.      omega-3 acid ethyl esters 1 G capsule   Commonly known as: LOVAZA   Take 4 g by mouth daily.      pantoprazole 40 MG tablet   Commonly known as: PROTONIX   Take 1 tablet (40 mg total) by mouth daily at 12 noon.      PARoxetine 20 MG tablet   Commonly known as: PAXIL  Take 10-20 mg by mouth 2 (two) times daily. 1 tab in am, 0.5 tab in pm      polyethylene glycol packet   Commonly known as: MIRALAX / GLYCOLAX   Take 17 g by mouth daily as needed.      rifampin 300 MG capsule   Commonly known as: RIFADIN   Take 2 capsules (600 mg total) by mouth daily.      traZODone 25 mg Tabs   Commonly known as: DESYREL   Take 0.5 tablets (25 mg total) by mouth at bedtime as needed.             Disposition and Follow-up: Pt medically stable and ready for discharge to facility. Will follow up with Orthopedics 06/28/11. Will have BMET drawn 06/28/11. Will follow  Up with cardiology 3 weeks. Will call to make an appointment with ID for the 1st week in April.   Consults:  cardiology  Infectious disease and orthopedics.   Physical exam: see note dated 06/24/11  Significant Diagnostic Studies:  Dg Chest 2 View  06/12/2011  *RADIOLOGY REPORT*  Clinical Data:  Shortness of breath, cough and chest pain.  CHEST - 2 VIEW  Comparison: 06/04/2011  Findings: The heart size and mediastinal contours are within normal limits.  Both lungs are clear.  The  visualized skeletal structures are unremarkable.  IMPRESSION: No active disease.  Original Report Authenticated By: Reola Calkins, M.D.   Dg Ankle Complete Left  06/12/2011  *RADIOLOGY REPORT*  Clinical Data: Pain and swelling.  Previous surgery.  LEFT ANKLE COMPLETE - 3+ VIEW  Comparison: 08/17/2003  Findings: There has been fracture of the fibular plate to.  There are also fractures of the lowest two screws.  There is refracture or nonunion.  There is degenerative disease of the ankle joint with osteophytes, joint effusion and small intra-articular loose bodies.  Talar dome appears smooth.  IMPRESSION: Fracture of the fibular plate and lowest two screws.  Refracture or nonunion of the bone.  Degenerative disease of the ankle joint with osteophyte formation, joint effusion and probably small intra-articular loose bodies.  Original Report Authenticated By: Thomasenia Sales, M.D.   Portable Chest Xray In Am  06/13/2011  *RADIOLOGY REPORT*  Clinical Data: 71 year old male with respiratory failure and on ventilator.  PORTABLE CHEST - 1 VIEW  Comparison: 06/12/2011 and prior chest radiographs  Findings: An endotracheal tube with tip 6.2 cm above the carina is again identified and appears satisfactorily positioned. Continued atelectasis/airspace disease throughout the left lung again noted. There is no evidence of pneumothorax. There is been little interval change since the prior study.  IMPRESSION: Stable chest radiograph with continued left lung airspace opacities and stable endotracheal tube.  Original Report Authenticated By: Rosendo Gros, M.D.   Dg Chest Port 1 View  06/13/2011  *RADIOLOGY REPORT*  Clinical Data: Repositioned endotracheal tube.  PORTABLE CHEST - 1 VIEW  Comparison: Earlier today.  Findings: The endotracheal tube has been advanced and is in satisfactory position.  Progressive left lung airspace opacity, especially in the left lower lobe, where dense opacity is currently demonstrated.   Interval small amount of linear density at the right lung base.  Normal sized heart.  IMPRESSION:  1.  Satisfactory position of the endotracheal tube. 2.  Progressive left lung airspace opacity, especially in the left lower lobe, suspicious for pneumonia. 3.  Interval minimal right basilar atelectasis.  Original Report Authenticated By: Darrol Angel, M.D.   Dg Chest [facility]  06/12/2011  *RADIOLOGY REPORT*  Clinical Data: Endotracheal tube repositioned.  PORTABLE CHEST - 1 VIEW  Comparison: Earlier today.  Findings: The endotracheal tube has been retracted with its tip at the thoracic inlet.  Significantly improved aeration of the left lung with residual patchy airspace opacity.  Clear right lung. Normal sized heart.  Improved mediastinal shift to the left.  IMPRESSION:  1.  Endotracheal tube tip at the thoracic inlet.  It is recommended that this be advanced 4 cm. 2.  Partial re-expansion of the left lung with residual pneumonia or re-expansion pulmonary edema.  Original Report Authenticated By: Darrol Angel, M.D.   Dg Chest Port 1 View  06/12/2011  *RADIOLOGY REPORT*  Clinical Data: Intubated.  PORTABLE CHEST - 1 VIEW  Comparison: 06/12/2011.  Findings: Interval endotracheal tube with its tip in the right mainstem bronchus.  Interval complete opacification of the left hemithorax.  Clear right lung.  Mediastinal shift to the left. Unremarkable bones.  IMPRESSION: Endotracheal tube tip in the right mainstem bronchus with associated complete collapse of the left lung.  It is recommended that this be retracted 7 cm.  These results were called by telephone on 06/12/2011  at  2215 hours to  [facility], the patient's nurse, who verbally acknowledged these results.  Original Report Authenticated By: Darrol Angel, M.D.   Dg Knee Complete 4 Views Left  06/12/2011  *RADIOLOGY REPORT*  Clinical Data: Pain and swelling.  Fluid aspiration.  LEFT KNEE - COMPLETE 4+ VIEW  Comparison: None.  Findings: There is a joint  effusion.  A few dots of a air are notable in the suprapatellar region, presumably related to the recent aspiration.  Components of the total knee replacement appear unremarkable.  IMPRESSION: Previous total knee replacement.  Joint effusion.  A few dots of a air in the suprapatellar region presumably related to the recent aspiration.  Original Report Authenticated By: Thomasenia Sales, M.D.   Dg Knee Complete 4 Views Right  06/12/2011  *RADIOLOGY REPORT*  Clinical Data: Pain.  Fluid aspiration.  RIGHT KNEE - COMPLETE 4+ VIEW  Comparison: None.  Findings: There is advanced tricompartmental osteoarthritis with joint space narrowing and osteophyte formation.  There is a small amount of joint fluid.  No visible intra-articular loose body.  No fracture or other focal lesion.  Arterial calcification is noted.  IMPRESSION: Advanced tricompartmental osteoarthritis.  Small joint effusion.  Original Report Authenticated By: Thomasenia Sales, M.D.    Labs Reviewed  CBC - Abnormal; Notable for the following:    WBC 17.7 (*)    RBC 3.84 (*)    Hemoglobin 12.1 (*)    HCT 34.7 (*)    All other components within normal limits  DIFFERENTIAL - Abnormal; Notable for the following:    Neutrophils Relative 88 (*)    Neutro Abs 15.6 (*)    Lymphocytes Relative 4 (*)    Monocytes Absolute 1.3 (*)    All other components within normal limits  BASIC METABOLIC PANEL - Abnormal; Notable for the following:    Sodium 130 (*)    Chloride 94 (*)    Glucose, Bld 157 (*)    BUN 41 (*)    Creatinine, Ser 1.98 (*)    GFR calc non Af Amer 32 (*)    GFR calc Af Amer 37 (*)    All other components within normal limits  SEDIMENTATION RATE - Abnormal; Notable for the following:    Sed Rate 88 (*)  All other components within normal limits  CELL COUNT + DIFF,  W/ CRYST-SYNVL FLD - Abnormal; Notable for the following:    Color, Synovial RED (*)    Appearance-Synovial BLOODY (*)    Neutrophil, Synovial 90 (*)     Monocyte-Macrophage-Synovial Fluid 9 (*)    All other components within normal limits  CELL COUNT + DIFF,  W/ CRYST-SYNVL FLD - Abnormal; Notable for the following:    Color, Synovial MILKY (*)    Appearance-Synovial OPAQUE (*)    Neutrophil, Synovial 90 (*)    Monocyte-Macrophage-Synovial Fluid 5 (*)    All other components within normal limits  PROTIME-INR - Abnormal; Notable for the following:    Prothrombin Time 15.4 (*)    All other components within normal limits  BASIC METABOLIC PANEL - Abnormal; Notable for the following:    Sodium 129 (*)    Glucose, Bld 215 (*)    BUN 45 (*)    Creatinine, Ser 2.73 (*)    Calcium 8.2 (*)    GFR calc non Af Amer 22 (*)    GFR calc Af Amer 25 (*)    All other components within normal limits  CBC - Abnormal; Notable for the following:    WBC 17.9 (*)    RBC 3.28 (*)    Hemoglobin 10.5 (*)    HCT 30.1 (*)    Platelets 135 (*)    All other components within normal limits  URINALYSIS, ROUTINE W REFLEX MICROSCOPIC - Abnormal; Notable for the following:    APPearance TURBID (*)    Hgb urine dipstick LARGE (*)    Protein, ur 30 (*)    Leukocytes, UA MODERATE (*)    All other components within normal limits  GLUCOSE, CAPILLARY - Abnormal; Notable for the following:    Glucose-Capillary 243 (*)    All other components within normal limits  BLOOD GAS, ARTERIAL - Abnormal; Notable for the following:    pH, Arterial 7.336 (*)    pO2, Arterial 317.0 (*)    Acid-base deficit 2.8 (*)    All other components within normal limits  GLUCOSE, CAPILLARY - Abnormal; Notable for the following:    Glucose-Capillary 200 (*)    All other components within normal limits  GLUCOSE, CAPILLARY - Abnormal; Notable for the following:    Glucose-Capillary 186 (*)    All other components within normal limits  GLUCOSE, CAPILLARY - Abnormal; Notable for the following:    Glucose-Capillary 183 (*)    All other components within normal limits  URINE  MICROSCOPIC-ADD ON - Abnormal; Notable for the following:    Squamous Epithelial / LPF FEW (*)    Bacteria, UA MANY (*)    All other components within normal limits  GLUCOSE, CAPILLARY - Abnormal; Notable for the following:    Glucose-Capillary 162 (*)    All other components within normal limits  GLUCOSE, CAPILLARY - Abnormal; Notable for the following:    Glucose-Capillary 290 (*)    All other components within normal limits  BASIC METABOLIC PANEL - Abnormal; Notable for the following:    Sodium 128 (*)    Glucose, Bld 192 (*)    BUN 59 (*)    Creatinine, Ser 4.32 (*)    Calcium 8.3 (*)    GFR calc non Af Amer 12 (*)    GFR calc Af Amer 14 (*)    All other components within normal limits  CBC - Abnormal; Notable for the following:  WBC 15.0 (*)    RBC 3.10 (*)    Hemoglobin 9.7 (*)    HCT 28.2 (*)    Platelets 125 (*)    All other components within normal limits  GLUCOSE, CAPILLARY - Abnormal; Notable for the following:    Glucose-Capillary 241 (*)    All other components within normal limits  GLUCOSE, CAPILLARY - Abnormal; Notable for the following:    Glucose-Capillary 148 (*)    All other components within normal limits  GLUCOSE, CAPILLARY - Abnormal; Notable for the following:    Glucose-Capillary 190 (*)    All other components within normal limits  GLUCOSE, CAPILLARY - Abnormal; Notable for the following:    Glucose-Capillary 184 (*)    All other components within normal limits  GLUCOSE, CAPILLARY - Abnormal; Notable for the following:    Glucose-Capillary 247 (*)    All other components within normal limits  GLUCOSE, CAPILLARY - Abnormal; Notable for the following:    Glucose-Capillary 265 (*)    All other components within normal limits  GLUCOSE, CAPILLARY - Abnormal; Notable for the following:    Glucose-Capillary 279 (*)    All other components within normal limits  CBC - Abnormal; Notable for the following:    WBC 14.0 (*)    RBC 2.92 (*)    Hemoglobin  9.1 (*)    HCT 26.4 (*)    Platelets 129 (*)    All other components within normal limits  BASIC METABOLIC PANEL - Abnormal; Notable for the following:    Sodium 131 (*)    Glucose, Bld 240 (*)    BUN 73 (*)    Creatinine, Ser 5.06 (*)    GFR calc non Af Amer 10 (*)    GFR calc Af Amer 12 (*)    All other components within normal limits  GLUCOSE, CAPILLARY - Abnormal; Notable for the following:    Glucose-Capillary 275 (*)    All other components within normal limits  GLUCOSE, CAPILLARY - Abnormal; Notable for the following:    Glucose-Capillary 282 (*)    All other components within normal limits  GLUCOSE, CAPILLARY - Abnormal; Notable for the following:    Glucose-Capillary 200 (*)    All other components within normal limits  PRO B NATRIURETIC PEPTIDE - Abnormal; Notable for the following:    Pro B Natriuretic peptide (BNP) 10540.0 (*)    All other components within normal limits  GLUCOSE, CAPILLARY - Abnormal; Notable for the following:    Glucose-Capillary 276 (*)    All other components within normal limits  GLUCOSE, CAPILLARY - Abnormal; Notable for the following:    Glucose-Capillary 245 (*)    All other components within normal limits  BASIC METABOLIC PANEL - Abnormal; Notable for the following:    Sodium 132 (*)    CO2 18 (*)    Glucose, Bld 200 (*)    BUN 72 (*)    Creatinine, Ser 4.90 (*)    GFR calc non Af Amer 11 (*)    GFR calc Af Amer 12 (*)    All other components within normal limits  CBC - Abnormal; Notable for the following:    WBC 10.8 (*)    RBC 2.94 (*)    Hemoglobin 9.2 (*)    HCT 26.4 (*)    All other components within normal limits  PHOSPHORUS - Abnormal; Notable for the following:    Phosphorus 5.4 (*)    All other components within normal limits  GLUCOSE, CAPILLARY - Abnormal; Notable for the following:    Glucose-Capillary 210 (*)    All other components within normal limits  GLUCOSE, CAPILLARY - Abnormal; Notable for the following:     Glucose-Capillary 202 (*)    All other components within normal limits  GLUCOSE, CAPILLARY - Abnormal; Notable for the following:    Glucose-Capillary 216 (*)    All other components within normal limits  BASIC METABOLIC PANEL - Abnormal; Notable for the following:    CO2 18 (*)    Glucose, Bld 215 (*)    BUN 74 (*)    Creatinine, Ser 4.45 (*)    GFR calc non Af Amer 12 (*)    GFR calc Af Amer 14 (*)    All other components within normal limits  GLUCOSE, CAPILLARY - Abnormal; Notable for the following:    Glucose-Capillary 196 (*)    All other components within normal limits  GLUCOSE, CAPILLARY - Abnormal; Notable for the following:    Glucose-Capillary 194 (*)    All other components within normal limits  GLUCOSE, CAPILLARY - Abnormal; Notable for the following:    Glucose-Capillary 212 (*)    All other components within normal limits  GLUCOSE, CAPILLARY - Abnormal; Notable for the following:    Glucose-Capillary 221 (*)    All other components within normal limits  BASIC METABOLIC PANEL - Abnormal; Notable for the following:    Sodium 133 (*)    Glucose, Bld 142 (*)    BUN 71 (*)    Creatinine, Ser 4.04 (*)    GFR calc non Af Amer 14 (*)    GFR calc Af Amer 16 (*)    All other components within normal limits  GLUCOSE, CAPILLARY - Abnormal; Notable for the following:    Glucose-Capillary 228 (*)    All other components within normal limits  GLUCOSE, CAPILLARY - Abnormal; Notable for the following:    Glucose-Capillary 179 (*)    All other components within normal limits  BASIC METABOLIC PANEL - Abnormal; Notable for the following:    Sodium 131 (*)    CO2 18 (*)    Glucose, Bld 128 (*)    BUN 68 (*)    Creatinine, Ser 4.05 (*)    GFR calc non Af Amer 13 (*)    GFR calc Af Amer 16 (*)    All other components within normal limits  CBC - Abnormal; Notable for the following:    RBC 3.21 (*)    Hemoglobin 9.8 (*)    HCT 28.1 (*)    All other components within normal  limits  GLUCOSE, CAPILLARY - Abnormal; Notable for the following:    Glucose-Capillary 118 (*)    All other components within normal limits  CBC - Abnormal; Notable for the following:    WBC 13.8 (*)    RBC 3.34 (*)    Hemoglobin 10.1 (*)    HCT 29.3 (*)    All other components within normal limits  BASIC METABOLIC PANEL - Abnormal; Notable for the following:    Sodium 133 (*)    Glucose, Bld 68 (*)    BUN 65 (*)    Creatinine, Ser 3.81 (*)    GFR calc non Af Amer 15 (*)    GFR calc Af Amer 17 (*)    All other components within normal limits  GLUCOSE, CAPILLARY - Abnormal; Notable for the following:    Glucose-Capillary 138 (*)    All other components  within normal limits  GLUCOSE, CAPILLARY - Abnormal; Notable for the following:    Glucose-Capillary 65 (*)    All other components within normal limits  GLUCOSE, CAPILLARY - Abnormal; Notable for the following:    Glucose-Capillary 178 (*)    All other components within normal limits  CBC - Abnormal; Notable for the following:    RBC 2.97 (*)    Hemoglobin 9.0 (*)    HCT 26.4 (*)    All other components within normal limits  BASIC METABOLIC PANEL - Abnormal; Notable for the following:    Sodium 134 (*)    BUN 60 (*)    Creatinine, Ser 3.56 (*)    GFR calc non Af Amer 16 (*)    GFR calc Af Amer 18 (*)    All other components within normal limits  GLUCOSE, CAPILLARY - Abnormal; Notable for the following:    Glucose-Capillary 156 (*)    All other components within normal limits  GLUCOSE, CAPILLARY - Abnormal; Notable for the following:    Glucose-Capillary 58 (*)    All other components within normal limits  GLUCOSE, CAPILLARY - Abnormal; Notable for the following:    Glucose-Capillary 115 (*)    All other components within normal limits  GLUCOSE, CAPILLARY - Abnormal; Notable for the following:    Glucose-Capillary 122 (*)    All other components within normal limits  CBC - Abnormal; Notable for the following:    RBC  2.78 (*)    Hemoglobin 8.4 (*)    HCT 24.9 (*)    All other components within normal limits  BASIC METABOLIC PANEL - Abnormal; Notable for the following:    Sodium 134 (*)    Glucose, Bld 101 (*)    BUN 55 (*)    Creatinine, Ser 3.18 (*)    GFR calc non Af Amer 18 (*)    GFR calc Af Amer 21 (*)    All other components within normal limits  GLUCOSE, CAPILLARY - Abnormal; Notable for the following:    Glucose-Capillary 208 (*)    All other components within normal limits  GLUCOSE, CAPILLARY - Abnormal; Notable for the following:    Glucose-Capillary 108 (*)    All other components within normal limits  GLUCOSE, CAPILLARY - Abnormal; Notable for the following:    Glucose-Capillary 109 (*)    All other components within normal limits  VITAMIN B12 - Abnormal; Notable for the following:    Vitamin B-12 928 (*)    All other components within normal limits  IRON AND TIBC - Abnormal; Notable for the following:    Iron 15 (*)    TIBC 182 (*)    Saturation Ratios 8 (*)    All other components within normal limits  FERRITIN - Abnormal; Notable for the following:    Ferritin 1366 (*)    All other components within normal limits  RETICULOCYTES - Abnormal; Notable for the following:    RBC. 2.76 (*)    All other components within normal limits  CBC - Abnormal; Notable for the following:    RBC 2.76 (*)    Hemoglobin 8.5 (*)    HCT 24.8 (*)    All other components within normal limits  BASIC METABOLIC PANEL - Abnormal; Notable for the following:    Glucose, Bld 202 (*)    BUN 46 (*)    Creatinine, Ser 2.77 (*)    GFR calc non Af Amer 21 (*)    GFR calc  Af Amer 25 (*)    All other components within normal limits  GLUCOSE, CAPILLARY - Abnormal; Notable for the following:    Glucose-Capillary 241 (*)    All other components within normal limits  GLUCOSE, CAPILLARY - Abnormal; Notable for the following:    Glucose-Capillary 186 (*)    All other components within normal limits  CBC -  Abnormal; Notable for the following:    RBC 2.54 (*)    Hemoglobin 7.8 (*)    HCT 22.9 (*)    All other components within normal limits  BASIC METABOLIC PANEL - Abnormal; Notable for the following:    Glucose, Bld 140 (*)    BUN 37 (*)    Creatinine, Ser 2.48 (*)    GFR calc non Af Amer 24 (*)    GFR calc Af Amer 28 (*)    All other components within normal limits  GLUCOSE, CAPILLARY - Abnormal; Notable for the following:    Glucose-Capillary 159 (*)    All other components within normal limits  GLUCOSE, CAPILLARY - Abnormal; Notable for the following:    Glucose-Capillary 118 (*)    All other components within normal limits  GLUCOSE, CAPILLARY - Abnormal; Notable for the following:    Glucose-Capillary 132 (*)    All other components within normal limits  GLUCOSE, CAPILLARY - Abnormal; Notable for the following:    Glucose-Capillary 273 (*)    All other components within normal limits  GLUCOSE, CAPILLARY - Abnormal; Notable for the following:    Glucose-Capillary 146 (*)    All other components within normal limits  CBC - Abnormal; Notable for the following:    RBC 2.53 (*)    Hemoglobin 7.8 (*)    HCT 22.7 (*)    All other components within normal limits  BASIC METABOLIC PANEL - Abnormal; Notable for the following:    Sodium 133 (*)    Glucose, Bld 143 (*)    BUN 32 (*)    Creatinine, Ser 2.32 (*)    GFR calc non Af Amer 26 (*)    GFR calc Af Amer 31 (*)    All other components within normal limits  GLUCOSE, CAPILLARY - Abnormal; Notable for the following:    Glucose-Capillary 208 (*)    All other components within normal limits  GLUCOSE, CAPILLARY - Abnormal; Notable for the following:    Glucose-Capillary 189 (*)    All other components within normal limits  GLUCOSE, CAPILLARY - Abnormal; Notable for the following:    Glucose-Capillary 130 (*)    All other components within normal limits  GLUCOSE, CAPILLARY - Abnormal; Notable for the following:     Glucose-Capillary 145 (*)    All other components within normal limits  GLUCOSE, CAPILLARY - Abnormal; Notable for the following:    Glucose-Capillary 185 (*)    All other components within normal limits  BODY FLUID CULTURE  BODY FLUID CULTURE  CULTURE, BLOOD (ROUTINE X 2)  CULTURE, BLOOD (ROUTINE X 2)  TYPE AND SCREEN  URINE CULTURE  MRSA PCR SCREENING  ABO/RH  ANAEROBIC CULTURE  WOUND CULTURE  CULTURE, RESPIRATORY  CULTURE, BLOOD (ROUTINE X 2)  CULTURE, BLOOD (ROUTINE X 2)  VANCOMYCIN, RANDOM  MAGNESIUM  CARDIAC PANEL(CRET KIN+CKTOT+MB+TROPI)  CARDIAC PANEL(CRET KIN+CKTOT+MB+TROPI)  CARDIAC PANEL(CRET KIN+CKTOT+MB+TROPI)  GLUCOSE, CAPILLARY  TSH  T4, FREE  GLUCOSE, CAPILLARY  GLUCOSE, CAPILLARY  GLUCOSE, CAPILLARY  FOLATE  WOUND CULTURE  SEDIMENTATION RATE  BODY FLUID CELL COUNT WITH DIFFERENTIAL  GRAM  STAIN  BODY FLUID CRYSTAL  BODY FLUID CRYSTAL  BODY FLUID CULTURE  BODY FLUID CELL COUNT WITH DIFFERENTIAL  CULTURE, SPUTUM-ASSESSMENT  CBC     Procedure(s): IRRIGATION AND DEBRIDEMENT KNEE WITH POLY EXCHANGE   Dg Chest 1 View  06/18/2011  *RADIOLOGY REPORT*  Clinical Data: Central line placement.  CHEST - 1 VIEW  Comparison: 06/15/2011  Findings: Left jugular vein catheter tip is in the superior vena cava at the level of the azygos vein. No pneumothorax.  Heart size and vascularity are normal and the lungs are clear.  No osseous abnormality.  The patient has a narrowed transverse diameter of the trachea at the level of the thoracic inlet but this appears unchanged since the prior PET CT dated 07/02/2008.  There are no masses compressing the trachea.  IMPRESSION: No acute abnormalities.  The tip of the central line is in the superior vena cava directed toward the side wall.  Original Report Authenticated By: Gwynn Burly, M.D.   Dg Chest 2 View  06/12/2011  *RADIOLOGY REPORT*  Clinical Data:  Shortness of breath, cough and chest pain.  CHEST - 2 VIEW  Comparison:  06/04/2011  Findings: The heart size and mediastinal contours are within normal limits.  Both lungs are clear.  The visualized skeletal structures are unremarkable.  IMPRESSION: No active disease.  Original Report Authenticated By: Reola Calkins, M.D.   Dg Chest 2 View  06/04/2011  *RADIOLOGY REPORT*  Clinical Data: Chest pain, shortness of breath, diabetes.  History of coronary artery disease.  CHEST - 2 VIEW  Comparison: 01/23/2009  Findings: Heart size is accentuated by the AP position of the patient.  Lungs are clear.  No edema. Visualized osseous structures have a normal appearance.  IMPRESSION: Negative exam.  Original Report Authenticated By: Patterson Hammersmith, M.D.   Dg Ankle Complete Left  06/12/2011  *RADIOLOGY REPORT*  Clinical Data: Pain and swelling.  Previous surgery.  LEFT ANKLE COMPLETE - 3+ VIEW  Comparison: 08/17/2003  Findings: There has been fracture of the fibular plate to.  There are also fractures of the lowest two screws.  There is refracture or nonunion.  There is degenerative disease of the ankle joint with osteophytes, joint effusion and small intra-articular loose bodies.  Talar dome appears smooth.  IMPRESSION: Fracture of the fibular plate and lowest two screws.  Refracture or nonunion of the bone.  Degenerative disease of the ankle joint with osteophyte formation, joint effusion and probably small intra-articular loose bodies.  Original Report Authenticated By: Thomasenia Sales, M.D.   Ct Head Wo Contrast  06/18/2011  *RADIOLOGY REPORT*  Clinical Data: Altered mental status.  Confusion.  CT HEAD WITHOUT CONTRAST  Technique:  Contiguous axial images were obtained from the base of the skull through the vertex without contrast.  Comparison: MRI of the brain dated 04/22/2007.  Findings: There is a well-defined focus of decreased attenuation immediately lateral to the head of the left caudate nucleus extending into the region of the left external capsule, similar to an area of  02/02 hyperintensity on remote MRI of the brain dated 04/22/2007, indicative of the sequelae of old lacunar infarction. No definite acute intracranial abnormalities.  Specifically, no definite signs of acute/subacute ischemia, focal mass, mass effect, hydrocephalus or abnormal intra or extra-axial fluid collections. There are extensive regions of decreased attenuation throughout the deep and periventricular white matter of the cerebral hemispheres bilaterally, most consistent with chronic microvascular ischemic changes.  No displaced skull fractures are identified.  The mastoids are largely sclerosis bilaterally, and visualized portions of the paranasal sinuses are remarkable for areas of mild mucosal thickening throughout the ethmoids, with a small air-fluid level in the right maxillary sinus.  IMPRESSION: 1.  No definite acute intracranial abnormalities (please note that accurate assessment for subtle regions acute/subacute ischemia upon this background of chronic microvascular ischemic changes in the brain is exceedingly difficult). 2.  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 3.  Mild mucosal thickening in the ethmoid sinuses and small air fluid level in the right maxillary sinus.  Clinical correlation for signs of symptoms of acute sinusitis is recommended.  Original Report Authenticated By: Florencia Reasons, M.D.   US Renal  06/14/2011  *RADIOLOGY REPORT*  Clinical Data: Elevated creatinine.  RENAL/URINARY TRACT ULTRASOUND COMPLETE  Comparison:  MRI 07/30/2008, PET CT 03/18/2009  Findings:  Right Kidney:  10.0 cm.  No mass or hydronephrosis.  Left Kidney:  12.1 cm.  No mass or hydronephrosis.  Bladder:  The bladder is not visualized.  IMPRESSION: Normal-appearing kidneys.  Non visualized bladder.  Original Report Authenticated By:  Patterson Hammersmith, M.D.   Ir US Guide Vasc Access Right  06/22/2011  *RADIOLOGY REPORT*  Clinical Data: Infection  PICC LINE PLACEMENT WITH ULTRASOUND AND FLUOROSCOPIC  GUIDANCE  Fluoroscopy Time: 0.2 minutes.  The right neck was prepped with chlorhexidine, draped in the usual sterile fashion using maximum barrier technique (cap and mask, sterile gown, sterile gloves, large sterile sheet, hand hygiene and cutaneous antisepsis) and infiltrated locally with 1% Lidocaine.  Ultrasound demonstrated patency of the right internal jugular vein, and this was documented with an image. A long subcutaneous tract was employed prior to puncture of the vein.  Under real-time ultrasound guidance, this vein was accessed with a 21 gauge micropuncture needle and image documentation was performed.  The needle was exchanged over a guidewire for a peel-away sheath through which a six [place] double lumen tunneled the PICC trimmed to 18 cm was advanced, positioned with its tip at the lower SVC/right atrial junction. The cuff was positioned in the subcutaneous tract.  Fluoroscopy during the procedure and fluoro spot radiograph confirms appropriate catheter position.  The catheter was flushed, secured to the skin with Prolene sutures, and covered with a sterile dressing.  Complications:  None.  IMPRESSION: Successful  arm PICC line placement with ultrasound and fluoroscopic guidance.  The catheter is ready for use.  Original Report Authenticated By: Donavan Burnet, M.D.   Dg Chest Port 1 View  06/15/2011  *RADIOLOGY REPORT*  Clinical Data: Extubation.History of diabetes. Hypercholesterolemia.  Depression.  Coronary artery disease.  PORTABLE CHEST - 1 VIEW  Comparison: 06/14/2011  Findings: Left IJ central line unchanged, with tip at high to mid SVC.  Normal heart size.  Cannot exclude small bilateral pleural effusions. No pneumothorax.  Mild pulmonary interstitial thickening is chronic and nonspecific.  Mild bibasilar airspace disease  is new.  IMPRESSION:  1.  Decreased lung volumes with development of bibasilar airspace disease.  Most likely atelectasis. 2.  Cannot exclude small bilateral pleural effusions.  Original Report Authenticated By: Consuello Bossier, M.D.   Dg Chest Port 1 View  06/14/2011  *RADIOLOGY REPORT*  Clinical Data: Left IJ central line placement  PORTABLE CHEST - 1 VIEW  Comparison: 06/13/2011  Findings: 1358 hours.  Lung volumes are low. The lungs are clear without  focal infiltrate, edema, pneumothorax or pleural effusion. Cardiopericardial silhouette is at upper limits of normal for size. Endotracheal tube has been removed in the interval.  A new left IJ central line tip projects at the proximal SVC level, just beyond the innominate vein confluence.  No evidence for pneumothorax. Telemetry leads overlie the chest.  IMPRESSION: Left IJ central line tip projects at the proximal SVC level.  No evidence for pneumothorax.  Original Report Authenticated By: ERIC A. MANSELL, M.D.   Portable Chest Xray In Am  06/13/2011  *RADIOLOGY REPORT*  Clinical Data: 72 year old male with respiratory failure and on ventilator.  PORTABLE CHEST - 1 VIEW  Comparison: 06/12/2011 and prior chest radiographs  Findings: An endotracheal tube with tip 6.2 cm above the carina is again identified and appears satisfactorily positioned. Continued atelectasis/airspace disease throughout the left lung again noted. There is no evidence of pneumothorax. There is been little interval change since the prior study.  IMPRESSION: Stable chest radiograph with continued left lung airspace opacities and stable endotracheal tube.  Original Report Authenticated By: Rosendo Gros, M.D.   Dg Chest Port 1 View  06/13/2011  *RADIOLOGY REPORT*  Clinical Data: Repositioned endotracheal tube.  PORTABLE CHEST - 1 VIEW  Comparison: Earlier today.  Findings: The endotracheal tube has been advanced and is in satisfactory position.  Progressive left lung airspace opacity,  especially in the left lower lobe, where dense opacity is currently demonstrated.  Interval small amount of linear density at the right lung base.  Normal sized heart.  IMPRESSION:  1.  Satisfactory position of the endotracheal tube. 2.  Progressive left lung airspace opacity, especially in the left lower lobe, suspicious for pneumonia. 3.  Interval minimal right basilar atelectasis.  Original Report Authenticated By: Darrol Angel, M.D.   Dg Chest Port 1 View  06/12/2011  *RADIOLOGY REPORT*  Clinical Data: Endotracheal tube repositioned.  PORTABLE CHEST - 1 VIEW  Comparison: Earlier today.  Findings: The endotracheal tube has been retracted with its tip at the thoracic inlet.  Significantly improved aeration of the left lung with residual patchy airspace opacity.  Clear right lung. Normal sized heart.  Improved mediastinal shift to the left.  IMPRESSION:  1.  Endotracheal tube tip at the thoracic inlet.  It is recommended that this be advanced 4 cm. 2.  Partial re-expansion of the left lung with residual pneumonia or re-expansion pulmonary edema.  Original Report Authenticated By: Darrol Angel, M.D.   Dg Chest Port 1 View  06/12/2011  *RADIOLOGY REPORT*  Clinical Data: Intubated.  PORTABLE CHEST - 1 VIEW  Comparison: 06/12/2011.  Findings: Interval endotracheal tube with its tip in the right mainstem bronchus.  Interval complete opacification of the left hemithorax.  Clear right lung.  Mediastinal shift to the left. Unremarkable bones.  IMPRESSION: Endotracheal tube tip in the right mainstem bronchus with associated complete collapse of the left lung.  It is recommended that this be retracted 7 cm.  These results were called by telephone on 06/12/2011  at  2215 hours to  [facility], the patient's nurse, who verbally acknowledged these results.  Original Report Authenticated By: Darrol Angel, M.D.   Dg Knee Complete 4 Views Left  06/12/2011  *RADIOLOGY REPORT*  Clinical Data: Pain and swelling.  Fluid  aspiration.  LEFT KNEE - COMPLETE 4+ VIEW  Comparison: None.  Findings: There is a joint effusion.  A few dots of a air are notable in the suprapatellar region, presumably related to the recent aspiration.  Components of the total knee replacement appear unremarkable.  IMPRESSION: Previous total knee replacement.  Joint effusion.  A few dots of a air in the suprapatellar region presumably related to the recent aspiration.  Original Report Authenticated By: Thomasenia Sales, M.D.   Dg Knee Complete 4 Views Right  06/12/2011  *RADIOLOGY REPORT*  Clinical Data: Pain.  Fluid aspiration.  RIGHT KNEE - COMPLETE 4+ VIEW  Comparison: None.  Findings: There is advanced tricompartmental osteoarthritis with joint space narrowing and osteophyte formation.  There is a small amount of joint fluid.  No visible intra-articular loose body.  No fracture or other focal lesion.  Arterial calcification is noted.  IMPRESSION: Advanced tricompartmental osteoarthritis.  Small joint effusion.  Original Report Authenticated By: Thomasenia Sales, M.D.   Ir Central Line Right  06/22/2011  *RADIOLOGY REPORT*  Clinical Data: Infection  PICC LINE PLACEMENT WITH ULTRASOUND AND FLUOROSCOPIC  GUIDANCE  Fluoroscopy Time: 0.2 minutes.  The right neck was prepped with chlorhexidine, draped in the usual sterile fashion using maximum barrier technique (cap and mask, sterile gown, sterile gloves, large sterile sheet, hand hygiene and cutaneous antisepsis) and infiltrated locally with 1% Lidocaine.  Ultrasound demonstrated patency of the right internal jugular vein, and this was documented with an image. A long subcutaneous tract was employed prior to puncture of the vein.  Under real-time ultrasound guidance, this vein was accessed with a 21 gauge micropuncture needle and image documentation was performed.  The needle was exchanged over a guidewire for a peel-away sheath through which a six [place] double lumen tunneled the PICC trimmed to 18 cm was  advanced, positioned with its tip at the lower SVC/right atrial junction. The cuff was positioned in the subcutaneous tract.  Fluoroscopy during the procedure and fluoro spot radiograph confirms appropriate catheter position.  The catheter was flushed, secured to the skin with Prolene sutures, and covered with a sterile dressing.  Complications:  None.  IMPRESSION: Successful  arm PICC line placement with ultrasound and fluoroscopic guidance.  The catheter is ready for use.  Original Report Authenticated By: Donavan Burnet, M.D.       Brief H and P: For complete details please refer to admission H and P, but in brief   71 year old man hx HTN, diabetes, depression, CAD, anemia, CKD stage III, ulcerative colitis s/p colectomy with colostomy presents the emergency department 06/12/11 with complaint of bilateral knee pain for 3 days. Date prior to admission the patient had joint aspiration left knee.  Patient underwent surgical debridement and failed extubation post procedure and therefore transferred to the critical care service. Developed atrial flutter with rapid ventricular response March 10. Eventually was transferred back to the hospitalist service.       Hospital Course:   Active Problems:  DM (diabetes mellitus), type 2 with complications  HYPERCHOLESTEROLEMIA  IIA  GOUT  ANEMIA, NORMOCYTIC, CHRONIC  DEPRESSION  HYPERTENSION, UNSPECIFIED  CAD, UNSPECIFIED SITE  HX, PERSONAL, MALIGNANCY, RECTUM/ANUS  Bronchitis  Infection of prosthetic left knee joint  ARF (acute renal failure)  CKD (chronic kidney disease) stage 3, GFR 30-59 ml/min  Morbid obesity  Leukocytosis  Hyponatremia  Staphylococcus aureus bacteremia  Atrial flutter, paroxysmal 1. 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 Weight bear as tolerated. Repeat knee culture no growth, final. Follow up with orthopedics on 06/28/11. 2. MSSA bacteremia: Secondary  to left prosthetic knee joint infection. Repeat blood cultures no growth, final. 6 weeks combination antibiotic therapy IV  cefazolin and oral rifampin, followed by several months of oral antibiotic therapy. As his renal function improves will need to be careful to adjust the dosing of his cefazolin. Will need appointment with ID for the 1st week of April which will be the 4th week of 6 week therapy.  3. Acute renal failure: Continued slow improvement. Likely secondary to acute infection. Renal US as above.  Continue to follow closely as an outpatient with repeat laboratory studies. 4. Atrial flutter with rapid ventricular response: Intermittent in nature. Probably related to underlying bacteremia. Oral Lopressor, oral diltiazem. No anti-coagulation as this condition is expected resolved. TSH within normal limits. Will follow up with Dr. Mariah Milling in 3 weeks.  5. Anemia of acute illness: Stable. Followup as an outpatient. Supplemental iron. 6. Ischemic cardiomyopathy/systolic and diastolic dysfunction: No ACE inhibitors secondary to renal dysfunction. Continue beta blocker. Will follow up with cardiology in 3 weeks.  7. Acute encephalopathy: Resolved. Secondary to acute infection. CT head as above 8. Hyponatremia: Resolved. Thought to be secondary to acute renal failure. 9. Left ankle fracture: X-ray revealed fracture the fibular plate versus nonunion of bone. Discussed with Dr. Cleophas Dunker . Will followup with pt as an outpatient. 10. Status post ventilatory-dependent respiratory failure postoperatively.  11. History of coronary artery disease: Stable. Continue aspirin. 12. Diabetes mellitus type 2: Well controlled. Hemoglobin A1c 5.7. Continue Lantus.   Time spent on Discharge:  35 min Signed: Gwenyth Bender 06/24/2011, 2:34 PM

## 2011-06-30 ENCOUNTER — Encounter (HOSPITAL_COMMUNITY): Payer: Self-pay | Admitting: Orthopedic Surgery

## 2011-07-07 ENCOUNTER — Encounter: Payer: Self-pay | Admitting: Internal Medicine

## 2011-07-07 ENCOUNTER — Ambulatory Visit (INDEPENDENT_AMBULATORY_CARE_PROVIDER_SITE_OTHER): Payer: Medicare Other | Admitting: Internal Medicine

## 2011-07-07 VITALS — BP 132/76 | HR 91 | Temp 97.5°F | Ht 72.0 in | Wt 298.0 lb

## 2011-07-07 DIAGNOSIS — Z96659 Presence of unspecified artificial knee joint: Secondary | ICD-10-CM

## 2011-07-07 DIAGNOSIS — T8454XA Infection and inflammatory reaction due to internal left knee prosthesis, initial encounter: Secondary | ICD-10-CM

## 2011-07-07 DIAGNOSIS — T8450XA Infection and inflammatory reaction due to unspecified internal joint prosthesis, initial encounter: Secondary | ICD-10-CM

## 2011-07-07 NOTE — Progress Notes (Addendum)
Patient ID: [name], male   DOB: [birth year], 71 y.o.   MRN: [id number]  INFECTIOUS DISEASE PROGRESS NOTE    Subjective: [name] is in for Logan hospital followup visit. He was hospitalized last month at [facility]  With MSSA bacteremia and left prosthetic knee infection. He was discharged to [facility] on IV Ancef and oral rifampin.  I do not have a current list of Logan active medications from the nursing home. He and [name] tell me that they believe Logan IV Ancef was stopped several days ago. He thinks he may have been switched to a second oral antibiotic but is not sure. He still has Logan central line in place.   Before he left the hospital he developed some bleeding from the distal incision over Logan knee. He has continued to have some drainage since discharge. [name] relates that Dr. Cleophas Dunker drain some fluid from Logan knee last week and that the culture was negative. He had several retained sutures removed and Logan lower incision was packed earlier today.   He states that Logan pain has improved and he is requiring less pain medication. He has been working with physical therapist at the nursing home but is eager to be discharged back to Logan own home. He has not had any problems tolerating Logan antibiotics.  Objective: Temp: 97.5 F (36.4 C) (04/03 1553) Temp src: Oral (04/03 1553) BP: 132/76 mmHg (04/03 1553) Pulse Rate: 91  (04/03 1553)  General:  He is alert and cantankerous as usual Skin:  Logan right internal jugular central line site appears normal Lungs:  clear Cor:  Irregular S1 and S2 with no murmurs Logan left knee has a large gauze bandage in place and I did not remove this  Lab Results No results found for this basename: CRP    Lab Results  Component Value Date   ESRSEDRATE 88* 06/12/2011      Assessment:  I am encouraged that he appears to be having less pain and he certainly has shown improvement with antibiotic therapy. Logan blood  cultures in the hospital did become negative very rapidly after institution of antibiotics. However it remains to be seen if we will be able to cure this prosthetic joint infection without further surgery. I will continue him on oral Keflex and rifampin, have Logan central line removed and check a sedimentation rate and C-reactive protein today. He will need several more months of antibiotic therapy.  Plan: 1.  continue therapy with oral Keflex and rifampin 2.  Remove central line 3.  Sedimentation rate and C-reactive protein 4.  Return to clinic in one month   Cliffton Asters, MD [facility] for Infectious Diseases [facility] [phone] pager   [phone] cell 07/07/2011, 4:53 PM  Addendum: [name] Logan creatinine remains just over 2 today with a GFR around 25. I will decrease the Keflex to 500 mg twice daily and continue it along with the rifampin 600 mg daily for ongoing therapy of Logan MSSA left prosthetic knee infection.  Cliffton Asters, MD [facility] for Infectious Diseases [facility] [phone] pager   [phone] cell 07/08/2011, 1:39 PM

## 2011-07-08 LAB — CBC
HCT: 23.9 % — ABNORMAL LOW (ref 39.0–52.0)
MCV: 89.5 fL (ref 78.0–100.0)
RBC: 2.67 MIL/uL — ABNORMAL LOW (ref 4.22–5.81)
WBC: 8.6 10*3/uL (ref 4.0–10.5)

## 2011-07-08 LAB — COMPREHENSIVE METABOLIC PANEL
BUN: 32 mg/dL — ABNORMAL HIGH (ref 6–23)
CO2: 18 mEq/L — ABNORMAL LOW (ref 19–32)
Calcium: 9.6 mg/dL (ref 8.4–10.5)
Chloride: 103 mEq/L (ref 96–112)
Creat: 2.12 mg/dL — ABNORMAL HIGH (ref 0.50–1.35)

## 2011-07-08 LAB — C-REACTIVE PROTEIN: CRP: 8.43 mg/dL — ABNORMAL HIGH (ref <0.60)

## 2011-07-08 MED ORDER — CEPHALEXIN 500 MG PO CAPS: 500.0000 mg | ORAL_CAPSULE | Freq: Two times a day (BID) | ORAL | Status: DC

## 2011-07-08 NOTE — Progress Notes (Signed)
Addended by: Cliffton Asters on: 07/08/2011 01:39 PM   Modules accepted: Orders

## 2011-07-09 ENCOUNTER — Telehealth: Payer: Self-pay | Admitting: *Deleted

## 2011-07-09 MED ORDER — CEPHALEXIN 500 MG PO CAPS: 500.0000 mg | ORAL_CAPSULE | Freq: Two times a day (BID) | ORAL | Status: DC

## 2011-07-09 NOTE — Progress Notes (Signed)
Addended by: Jennet Maduro D on: 07/09/2011 09:19 AM   Modules accepted: Orders

## 2011-07-09 NOTE — Telephone Encounter (Signed)
Spoke with pt's wife.  Appt for central line removal is Monday, April 8 @ 11:30 AM.  Wife stated that this appt would be kept.  SNF is starting arrangements to discharge patient home.  RN shared that Dr. Orvan Falconer decreased the oral antibiotic, Keflex, to bid from qid due to lab results for kidney function.  RN will send prescription for oral Keflex to pt's pharmacy to be available when pt is discharged from SNF.  Wife verbalized understanding of this information.

## 2011-07-12 ENCOUNTER — Ambulatory Visit (HOSPITAL_COMMUNITY)
Admission: RE | Admit: 2011-07-12 | Discharge: 2011-07-12 | Disposition: A | Discharge status: Home / Self Care | Payer: Medicare Other | Source: Ambulatory Visit | Attending: Internal Medicine | Admitting: Internal Medicine

## 2011-07-12 ENCOUNTER — Other Ambulatory Visit: Payer: Self-pay | Admitting: *Deleted

## 2011-07-12 DIAGNOSIS — Z452 Encounter for adjustment and management of vascular access device: Secondary | ICD-10-CM | POA: Insufficient documentation

## 2011-07-12 DIAGNOSIS — T8454XA Infection and inflammatory reaction due to internal left knee prosthesis, initial encounter: Secondary | ICD-10-CM

## 2011-07-12 NOTE — Telephone Encounter (Signed)
States she thinks he is getting out tomorrow. Pt thinks today but the md at [facility] is not there today to discharge him. If he is discharged tomorrow, that md will write rxs. She wanted to know if I could send the rxs in. Told her if he is discharged today, call me

## 2011-07-12 NOTE — Procedures (Signed)
(  R)tunneled PICC catheter removed without difficulty. Pressure dressing applied. Pt tolerated procedure well.  Barbee Shropshire [facility] 07/12/2011 11:56 AM

## 2011-07-13 ENCOUNTER — Telehealth (HOSPITAL_COMMUNITY): Payer: Self-pay

## 2011-07-22 ENCOUNTER — Telehealth: Payer: Self-pay | Admitting: *Deleted

## 2011-07-22 NOTE — Telephone Encounter (Signed)
[name], a PT with [facility] called asking for our md to sign for pt's PT & OT. He was recently discharged from a snf. I called her back & told her to get those from the pcp. I looked up that number & gave it to her

## 2011-07-26 ENCOUNTER — Other Ambulatory Visit: Payer: Self-pay | Admitting: *Deleted

## 2011-07-26 DIAGNOSIS — B999 Unspecified infectious disease: Secondary | ICD-10-CM

## 2011-07-26 MED ORDER — RIFAMPIN 300 MG PO CAPS: 600.0000 mg | ORAL_CAPSULE | Freq: Every day | ORAL | Status: DC

## 2011-07-26 NOTE — Telephone Encounter (Signed)
His wife called to report he would be out of the antibiotic before he sees the md again. Per last OV notes he is to continue & has 4 refills. I called them back & spoke with the husband. I asked him to call his pharmacy & ask for a refill.   HIs wife called back.

## 2011-08-05 ENCOUNTER — Encounter (HOSPITAL_COMMUNITY): Payer: Self-pay | Admitting: Pharmacy Technician

## 2011-08-05 ENCOUNTER — Ambulatory Visit (INDEPENDENT_AMBULATORY_CARE_PROVIDER_SITE_OTHER): Payer: Medicare Other | Admitting: Internal Medicine

## 2011-08-05 ENCOUNTER — Encounter: Payer: Self-pay | Admitting: Internal Medicine

## 2011-08-05 VITALS — BP 128/73 | HR 71 | Temp 98.1°F | Ht 72.0 in | Wt 240.0 lb

## 2011-08-05 DIAGNOSIS — T8454XA Infection and inflammatory reaction due to internal left knee prosthesis, initial encounter: Secondary | ICD-10-CM

## 2011-08-05 DIAGNOSIS — T8450XA Infection and inflammatory reaction due to unspecified internal joint prosthesis, initial encounter: Secondary | ICD-10-CM

## 2011-08-05 DIAGNOSIS — Z96659 Presence of unspecified artificial knee joint: Secondary | ICD-10-CM

## 2011-08-05 NOTE — Progress Notes (Signed)
Patient ID: [name], male   DOB: May 22, 1940, 71 y.o.   MRN: [id number]  INFECTIOUS DISEASE PROGRESS NOTE    Subjective: [name] is in with his wife, Kathie Rhodes, for his routine visit. He is now completed 54 days of antibiotic therapy for methicillin sensitive staph aureus left prosthetic knee infection. Unfortunately he continues to have a great deal of pain and states that he is still extremely weak. He continues to have some bloody drainage from the distal incision over his left knee and has now had to have arthrocentesis on 3 separate occasions since discharge from the hospital. He states that he saw Dr. Cleophas Dunker yesterday and was told that the cultures of the joint fluid have been negative since discharge. It sounds like the plan is to have him readmitted to the hospital on May 14 for removal of the prosthesis and spacer placement.  Objective: Temp: 98.1 F (36.7 C) (05/02 0924) Temp src: Oral (05/02 0924) BP: 128/73 mmHg (05/02 0924) Pulse Rate: 71  (05/02 0924)  General: He is joking and in good spirits as usual. Lungs: Few crackles Cor: Regular S1 and S2 Left knee: Slightly swollen and warm to the touch. He has an open sinus tract distally over the incision with some thick bloody drainage  Lab Results Lab Results  Component Value Date   CRP 8.43* 07/07/2011    Lab Results  Component Value Date   ESRSEDRATE 139* 07/07/2011      Assessment: It appears that he has persistent infection of his left prosthetic knee despite recent debridement, poly-exchange in nearly 2 months of antibiotics. I will stop his cephalexin and rifampin now in anticipation of his upcoming surgery so that we will have the best possibility of getting good culture data to guide antimicrobial therapy postoperatively. I suspect that methicillin sensitive staph aureus is still the culprit.  Plan: 1. Discontinue cephalexin and rifampin and observe off of antibiotics pending readmission to the hospital on May  14   Cliffton Asters, MD [facility] for Infectious Diseases [facility] [phone] pager   [phone] cell 08/05/2011, 9:52 AM

## 2011-08-09 NOTE — Pre-Procedure Instructions (Addendum)
18 [name]  08/09/2011   Your procedure is scheduled on:  5.21.13  Report to [facility] at 1220 AM.  Call this number if you have problems the morning of surgery: [phone]   Remember:   Do not eat food:After Midnight.  May have clear liquids: up to 4 Hours before arrival. 820 am  Clear liquids include soda, tea, black coffee, apple or grape juice, broth.  Take these medicines the morning of surgery with A SIP OF WATER: nebulizer,cardizem, hydrocodone, metropolol, protonix, STOP aspirin, vytorin, cinnamon, any herbal meds, blood thinners   Do not wear jewelry,   Do not wear lotions, powders, or perfumes. You may wear deodorant.    Do not bring valuables to the hospital.  Contacts, dentures or bridgework may not be worn into surgery.  Leave suitcase in the car. After surgery it may be brought to your room.  For patients admitted to the hospital, checkout time is 11:00 AM the day of discharge.   Patients discharged the day of surgery will not be allowed to drive home.  Name and phone number of your driver:   Special Instructions: Incentive Spirometry - Practice and bring it with you on the day of surgery. and CHG Shower Use Special Wash: 1/2 bottle night before surgery and 1/2 bottle morning of surgery.   Please read over the following fact sheets that you were given: Pain Booklet, Coughing and Deep Breathing, Blood Transfusion Information, Total Joint Packet, MRSA Information and Surgical Site Infection Prevention

## 2011-08-10 ENCOUNTER — Encounter (HOSPITAL_COMMUNITY): Payer: Self-pay

## 2011-08-10 ENCOUNTER — Encounter (HOSPITAL_COMMUNITY)
Admission: RE | Admit: 2011-08-10 | Discharge: 2011-08-10 | Disposition: A | Discharge status: Home / Self Care | Payer: Medicare Other | Source: Ambulatory Visit | Attending: Orthopaedic Surgery | Admitting: Orthopaedic Surgery

## 2011-08-10 HISTORY — DX: Cerebral infarction, unspecified: I63.9

## 2011-08-10 HISTORY — DX: Urinary calculus, unspecified: N20.9

## 2011-08-10 HISTORY — DX: Gastro-esophageal reflux disease without esophagitis: K21.9

## 2011-08-10 LAB — DIFFERENTIAL
Basophils Relative: 0 % (ref 0–1)
Eosinophils Absolute: 0.5 10*3/uL (ref 0.0–0.7)
Eosinophils Relative: 5 % (ref 0–5)
Lymphs Abs: 2.8 10*3/uL (ref 0.7–4.0)
Monocytes Relative: 8 % (ref 3–12)
Neutrophils Relative %: 56 % (ref 43–77)

## 2011-08-10 LAB — COMPREHENSIVE METABOLIC PANEL
ALT: 12 U/L (ref 0–53)
Albumin: 3.1 g/dL — ABNORMAL LOW (ref 3.5–5.2)
Alkaline Phosphatase: 72 U/L (ref 39–117)
BUN: 26 mg/dL — ABNORMAL HIGH (ref 6–23)
Chloride: 100 mEq/L (ref 96–112)
Glucose, Bld: 244 mg/dL — ABNORMAL HIGH (ref 70–99)
Potassium: 5.6 mEq/L — ABNORMAL HIGH (ref 3.5–5.1)
Sodium: 131 mEq/L — ABNORMAL LOW (ref 135–145)
Total Bilirubin: 0.3 mg/dL (ref 0.3–1.2)
Total Protein: 7.9 g/dL (ref 6.0–8.3)

## 2011-08-10 LAB — CBC
HCT: 25.9 % — ABNORMAL LOW (ref 39.0–52.0)
Hemoglobin: 8.4 g/dL — ABNORMAL LOW (ref 13.0–17.0)
RDW: 16.1 % — ABNORMAL HIGH (ref 11.5–15.5)
WBC: 9 10*3/uL (ref 4.0–10.5)

## 2011-08-10 LAB — APTT: aPTT: 39 seconds — ABNORMAL HIGH (ref 24–37)

## 2011-08-10 LAB — SURGICAL PCR SCREEN: Staphylococcus aureus: NEGATIVE

## 2011-08-10 LAB — PROTIME-INR: INR: 1.1 (ref 0.00–1.49)

## 2011-08-10 MED ORDER — CHLORHEXIDINE GLUCONATE 4 % EX LIQD: 60.0000 mL | Freq: Once | CUTANEOUS | Status: DC

## 2011-08-10 MED ORDER — CHLORHEXIDINE GLUCONATE 4 % EX LIQD: 60.0000 mL | Freq: Every day | CUTANEOUS | Status: DC

## 2011-08-10 NOTE — Progress Notes (Addendum)
Patient unable to get u/a spec. To bring [employer], ekg in epic from 3.9.13,  Echo 06/16/11 no cardiac dr seen in 5 yrs or more

## 2011-08-11 ENCOUNTER — Encounter (HOSPITAL_COMMUNITY): Payer: Self-pay | Admitting: Vascular Surgery

## 2011-08-11 NOTE — Consult Note (Signed)
Anesthesia Chart Review:  Patient is a 71 year old male scheduled for removal of left TK prosthesis and placement of antibiotic spacer on 08/24/11.  History includes MSSA of left knee prosthesis (followed by ID Dr. Cliffton Asters and Dr. Cleophas Dunker), former smoker, gout, DM2, hypercholesterolemia, CAD (? s/p PTCA X 2 '92 by Dr. Elvis Coil note, but not mentioned in prior Cardiology records, ICM with EF 40-45% 06/2011), CKD stage III, depression, HTN, ulcerative colitis, ETOH abuse (with no current use documented), AAA s/p repair '92, arthritis, bladder and colon cancer, history of colon resection with colostomy, TURP, left TKR '10.  In 2009 he was evaluated by EP Cardiologist Dr. Ladona Ridgel for unexplained syncope and had a negative tilt table test and had a loop recorder implanted for several months which did not show any significant brady- or tachy- cardia.  The loop recorder was explanted in April 2010.  PCP is Dr. Burnell Blanks.  He was admitted in mid-March 2013 for MSSA bacteremia and developed acute respiratory failure requiring intubation and acute renal failure.  He also developed afib with RVR and was seen by Cardiologist Dr. [place] (? Primary Cardiologist Dr. Mariah Milling).  He converted to SR on Cardizem.  By notes, he felt this was driven by his underlying bacteremia, and he felt his CAD and ICM were stable without chest pain or CHF symptoms.    His last EKG was on 06/18/11 and showed NSR, possible anterior infarct (age undetermined).  It was felt stable from his prior EKG on 06/12/11.    Echo on 06/16/11 showed: - Technically difficult study with poor acoustic windows. Normal LV size with mild LV hypertrophy. EF 40-45% with possible inferior and posterior hypokinesis but this study is difficult to evaluate for wall motion. Normal RV size and systolic function. Mild MR with no signficant valvular abnormalities. No evidence for endocarditis was seen but if clinical suspicion is high, would need TEE.  CXR on  06/12/11 showed no active disease.  Labs noted.  K 5.6.  Cr 1.54 (down from 2.12 on 07/07/11).  Glucose 244.  H/H 8.4/25.9 (up from 7.7/23.9 on 07/07/11). PT/INR WNL.  PTT 39.  T&S already done (expires on 08/24/11).  Recheck a BMET on arrival.  Defer additional lab orders to Dr. Cleophas Dunker.  (I did call lab results to Keri who will have Dr. Cleophas Dunker or his PA review.)  If follow-up labs reasonable and no acute cardiopulmonary symptoms, then anticipate he can proceed as planned.  Reviewed with Anesthesiologist Dr. Noreene Larsson who agrees with this plan.  Shonna Chock, PA-C

## 2011-08-15 NOTE — H&P (Signed)
COMPLAINT:     Painful left knee.  HPI:     The patient is a very pleasant 71 year old white male who is seen today for follow-up evaluation of his left knee.  On 06/12/11 he presented to the [facility] with significant pain and discomfort in his left knee.  He had been hospitalized from 06/04/11 through 06/08/11 with chest pain and bronchitis.  The chest pain was thought to be that of bronchitis.  He was placed on steroids and discharged with a steroid taper on 06/08/11.  Subsequently after that discharge on 06/09/11 the patient had developed left knee pain.  He reported that his knee pain had been increasing and he went to see his primary care physician on 06/11/11 and had his left knee tapped.  He had increasing pain in the left knee and also the ankle during the night of 06/11/11 and actually started having right knee pain.  He presented to the hospital on 06/12/11 and was noted to have significant pain, discomfort, and swelling in the knee.  He was afebrile at the time.  However, because of his swelling it was felt that he did have an aspiration in the hospital which revealed intracellular bacteria and Dr. Madelon Lips took him to the operating room on 06/12/11 where he performed incision, drainage, irrigation, and debridement of the left knee and a poly exchange.  He was admitted to the hospital at that time and was placed on antibiotics.  He was noted to have methicillin sensitive staph aureus in his blood as well as in his left knee.  His right knee was clean.  During his hospital stay we did do several aspirations of fluid from the knee and the subcutaneous areas.  An I&D locally at the distal portion of the wound was performed and this was packed up until about two days ago.  Since that time he has had antibiotics and has been followed by Dr. Bonnita Nasuti.  He has had improvement in his pain, but he still continues to have significant swelling and pain in the knee.  He returns today for  reevaluation.  MEDICATIONS:  Aspirin 81 mg daily, cinnamon 1,000 mg daily, Cardizem 240 mg daily, Vytorin 10/40 h.s., Norco 5/325 1-2 tabs q. 4h. p.r.n. pain, lorazepam 0.5 mg 1 tablet q. 8h. for anxiety as needed, Xopenex 0.6 mg/3 mL, nebulizer q. 6h. p.r.n. wheezing and shortness of breath, metoprolol (Lopressor) 25 mg 1 tablet b.i.d., multivitamin, omega-3 ethyl esters (Lovaza) 4 grams daily.  He is also on Paxil 20 mg tablets that he can take 10 to 20 mg by mouth two times a day, one in the morning and one-half milligram in the evening, Protonix 40 mg at noon, Losartan 100 mg daily, and trazodone 25 mg -tablet h.s.  He is also on Victoza 18/3 mL 1.2 solution daily, NovoLog 100 units/mL 16/20/14 and Lantus 48 units h.s.  ALLERGIES:  Dilaudid, Ativan, and Fentanyl all cause delirium.   PAST MEDICAL HISTORY:  His health is fair.  PAST SURGICAL HISTORY:   Angioplasty 1992 and abdominal aortic aneurysm1992.  he had bladder tumor excised in 2001 with recurrence in 2002 and had chemo wash of the bladder and apparently has not had a recurrence.  In 2001 he did have an arthroscopy of the knee.  In 2005 he had open reduction internal fixation of a compound ankle fracture.  In 2007 he had colon cancer and had colectomy with placement of colostomy.  He also had his prostate removed in 2008  by TUR.  Knee replacement was performed on the left knee in 2009.  In 2013 as noted in the history he developed congestion in lungs and bronchitis and possible pleurisy.    HOSPITALIZATIONS:  He was hospitalized in 1993 with numbness over the entire body and had to have medication adjustment.  ROS:     Fourteen point review of systems is negative except for history of GERD treated with Protonix.  He was hospitalized this year for bronchitis and pleurisy and developed chest pain and shortness of breath.  Apparently he has had a light "stroke" with minimal residual affect.  He has been diabetic for 7 to 8 years and he is now  insulin dependent.  He has had bladder and colon cancer.  He has had renal calculi 11 times before the age of 35 and they were calcium oxalate crystals.  Apparently he states that this was secondary to drinking too much milk.  He does have occasional dizziness which he feels is on the basis of working with his infection at the present time.   FAMILY HISTORY:  Positive for mother who died at 87 years of age, father who died at age 40 from ruptured appendix.  He has two living brothers ages 48 and 73.  He has three deceased brothers who died in their 109s.  He has five sisters not living.  SOCIAL HISTORY:   He is a 71 year old white married male.  He is retired from [employer].  He quit smoking cigarettes 40 years ago after smoking 2 PPD x 30 years. He does not drink alcohol.  EXAM:     Examination today reveals a 71 year old white male who is well developed, well nourished, alert, pleasant, and cooperative.  He is in moderate distress secondary to left knee pain.    Height 6 feet, 0 inches.  Weight 254 pounds.  BMI 34.4.   Temperature 98.4 degrees.  Respirations 18.  Pulse 80.  Blood pressure 126/80.  Head:  Normocephalic. Eyes: PERRLA ENT:  Benign. Neck:  Supple.  No bruits noted. Chest:  Good expansion. Lungs:  Clear to auscultation bilaterally. Cardiac:  Regular rhythm, rate.  No murmurs noted. Abdomen:  Obese, soft, nontender, no masses palpable, normal bowel sounds present.  Ostomy bag noted. CNS:  Oriented x 3.  Cranial nerves 2-12 grossly intact. Skin:  Intact over the left knee.  There is an eschar at the inferior aspect of the knee wound. Genital, rectal, breast:  Not indicated for orthopedic evaluation. Musculoskeletal:  He has range of motion today from about 3 to 4 degrees to 90 degrees.  He has an effusion of the knee noted.  He is ligamentously stable at this time.  He is not particularly warm today.  There is no erythema.  He is neurovascularly intact distally.    IMPRESSION:      1. 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 2. Recent history of MSSA sepsis with septic arthritis of the left knee. 3. Insulin-dependent diabetes mellitus. 4. Possible CVA. 5. Recent history of bronchitis. 6. Bladder carcinoma. 7. 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 8. Renal calculi.  RECOMMENDATIONS:      1. At this time, we feel that after discussing with Dr. Orvan Falconer, the infectious disease doctor, that the patient is a candidate for arthrotomy with removal of the total joint replacement and placement of an antibiotic spacer. 2. The procedure risks and benefits have been fully explained to the patient and he is understanding and would  like to proceed with this.  He understands he will be on IV antibiotics for probably six weeks.  He also understands that we will not replace his knee until we are sure that this infection is under control as per Infectious Disease. 3. We will send him over to the hospital to obtain laboratory studies.   4. I have aspirated the knee today and will send this off for cultures as well as cell count and differential 5. Plan for removal of TKR and placement of antibiotic spacer.   Oris Drone Aleda Grana [facility] [phone]  08/16/2011 5:45 PM

## 2011-08-23 MED ORDER — ACETAMINOPHEN 10 MG/ML IV SOLN
1000.0000 mg | Freq: Once | INTRAVENOUS | Status: AC
  Administered 2011-08-24: 1000 mg via INTRAVENOUS
  Filled 2011-08-23: qty 100

## 2011-08-24 ENCOUNTER — Encounter (HOSPITAL_COMMUNITY): Payer: Self-pay

## 2011-08-24 ENCOUNTER — Encounter (HOSPITAL_COMMUNITY)
Admission: RE | Disposition: A | Discharge status: Skilled Nursing Facility | Payer: Self-pay | Source: Ambulatory Visit | Attending: Orthopaedic Surgery

## 2011-08-24 ENCOUNTER — Inpatient Hospital Stay (HOSPITAL_COMMUNITY)
Admission: RE | Admit: 2011-08-24 | Discharge: 2011-08-27 | DRG: 464 | Disposition: A | Discharge status: Skilled Nursing Facility | Payer: Medicare Other | Source: Ambulatory Visit | Attending: Orthopaedic Surgery | Admitting: Orthopaedic Surgery

## 2011-08-24 ENCOUNTER — Encounter (HOSPITAL_COMMUNITY): Payer: Self-pay | Admitting: Vascular Surgery

## 2011-08-24 ENCOUNTER — Ambulatory Visit (HOSPITAL_COMMUNITY): Payer: Medicare Other | Admitting: Vascular Surgery

## 2011-08-24 DIAGNOSIS — Z87442 Personal history of urinary calculi: Secondary | ICD-10-CM

## 2011-08-24 DIAGNOSIS — I10 Essential (primary) hypertension: Secondary | ICD-10-CM | POA: Diagnosis present

## 2011-08-24 DIAGNOSIS — F329 Major depressive disorder, single episode, unspecified: Secondary | ICD-10-CM | POA: Diagnosis present

## 2011-08-24 DIAGNOSIS — E875 Hyperkalemia: Secondary | ICD-10-CM | POA: Diagnosis not present

## 2011-08-24 DIAGNOSIS — Y831 Surgical operation with implant of artificial internal device as the cause of abnormal reaction of the patient, or of later complication, without mention of misadventure at the time of the procedure: Secondary | ICD-10-CM | POA: Diagnosis present

## 2011-08-24 DIAGNOSIS — F3289 Other specified depressive episodes: Secondary | ICD-10-CM | POA: Diagnosis present

## 2011-08-24 DIAGNOSIS — I129 Hypertensive chronic kidney disease with stage 1 through stage 4 chronic kidney disease, or unspecified chronic kidney disease: Secondary | ICD-10-CM | POA: Diagnosis present

## 2011-08-24 DIAGNOSIS — Z96659 Presence of unspecified artificial knee joint: Secondary | ICD-10-CM

## 2011-08-24 DIAGNOSIS — Y92009 Unspecified place in unspecified non-institutional (private) residence as the place of occurrence of the external cause: Secondary | ICD-10-CM

## 2011-08-24 DIAGNOSIS — F1010 Alcohol abuse, uncomplicated: Secondary | ICD-10-CM | POA: Diagnosis present

## 2011-08-24 DIAGNOSIS — E871 Hypo-osmolality and hyponatremia: Secondary | ICD-10-CM | POA: Diagnosis not present

## 2011-08-24 DIAGNOSIS — Z6832 Body mass index (BMI) 32.0-32.9, adult: Secondary | ICD-10-CM

## 2011-08-24 DIAGNOSIS — M009 Pyogenic arthritis, unspecified: Secondary | ICD-10-CM | POA: Diagnosis present

## 2011-08-24 DIAGNOSIS — T8454XA Infection and inflammatory reaction due to internal left knee prosthesis, initial encounter: Secondary | ICD-10-CM | POA: Diagnosis present

## 2011-08-24 DIAGNOSIS — T8450XA Infection and inflammatory reaction due to unspecified internal joint prosthesis, initial encounter: Principal | ICD-10-CM | POA: Diagnosis present

## 2011-08-24 DIAGNOSIS — Z888 Allergy status to other drugs, medicaments and biological substances status: Secondary | ICD-10-CM

## 2011-08-24 DIAGNOSIS — M9080 Osteopathy in diseases classified elsewhere, unspecified site: Secondary | ICD-10-CM | POA: Diagnosis present

## 2011-08-24 DIAGNOSIS — E1129 Type 2 diabetes mellitus with other diabetic kidney complication: Secondary | ICD-10-CM | POA: Diagnosis present

## 2011-08-24 DIAGNOSIS — D649 Anemia, unspecified: Secondary | ICD-10-CM | POA: Diagnosis present

## 2011-08-24 DIAGNOSIS — E7800 Pure hypercholesterolemia, unspecified: Secondary | ICD-10-CM | POA: Diagnosis present

## 2011-08-24 DIAGNOSIS — Z79899 Other long term (current) drug therapy: Secondary | ICD-10-CM

## 2011-08-24 DIAGNOSIS — Z85048 Personal history of other malignant neoplasm of rectum, rectosigmoid junction, and anus: Secondary | ICD-10-CM

## 2011-08-24 DIAGNOSIS — A4901 Methicillin susceptible Staphylococcus aureus infection, unspecified site: Secondary | ICD-10-CM | POA: Diagnosis present

## 2011-08-24 DIAGNOSIS — R7881 Bacteremia: Secondary | ICD-10-CM | POA: Diagnosis present

## 2011-08-24 DIAGNOSIS — Z9861 Coronary angioplasty status: Secondary | ICD-10-CM

## 2011-08-24 DIAGNOSIS — I2510 Atherosclerotic heart disease of native coronary artery without angina pectoris: Secondary | ICD-10-CM | POA: Diagnosis present

## 2011-08-24 DIAGNOSIS — M869 Osteomyelitis, unspecified: Secondary | ICD-10-CM | POA: Diagnosis present

## 2011-08-24 DIAGNOSIS — Z8551 Personal history of malignant neoplasm of bladder: Secondary | ICD-10-CM

## 2011-08-24 DIAGNOSIS — Z8673 Personal history of transient ischemic attack (TIA), and cerebral infarction without residual deficits: Secondary | ICD-10-CM

## 2011-08-24 DIAGNOSIS — Z794 Long term (current) use of insulin: Secondary | ICD-10-CM

## 2011-08-24 DIAGNOSIS — Y849 Medical procedure, unspecified as the cause of abnormal reaction of the patient, or of later complication, without mention of misadventure at the time of the procedure: Secondary | ICD-10-CM

## 2011-08-24 DIAGNOSIS — N183 Chronic kidney disease, stage 3 unspecified: Secondary | ICD-10-CM | POA: Diagnosis present

## 2011-08-24 DIAGNOSIS — Z87891 Personal history of nicotine dependence: Secondary | ICD-10-CM

## 2011-08-24 DIAGNOSIS — K219 Gastro-esophageal reflux disease without esophagitis: Secondary | ICD-10-CM | POA: Diagnosis present

## 2011-08-24 DIAGNOSIS — E1169 Type 2 diabetes mellitus with other specified complication: Secondary | ICD-10-CM | POA: Diagnosis present

## 2011-08-24 DIAGNOSIS — M109 Gout, unspecified: Secondary | ICD-10-CM | POA: Diagnosis present

## 2011-08-24 DIAGNOSIS — D62 Acute posthemorrhagic anemia: Secondary | ICD-10-CM | POA: Diagnosis not present

## 2011-08-24 DIAGNOSIS — I2589 Other forms of chronic ischemic heart disease: Secondary | ICD-10-CM | POA: Diagnosis present

## 2011-08-24 HISTORY — PX: EXCISIONAL TOTAL KNEE ARTHROPLASTY WITH ANTIBIOTIC SPACERS: SHX5827

## 2011-08-24 LAB — PREPARE RBC (CROSSMATCH)

## 2011-08-24 LAB — URINALYSIS, MICROSCOPIC ONLY
Nitrite: NEGATIVE
Specific Gravity, Urine: 1.014 (ref 1.005–1.030)
Urobilinogen, UA: 0.2 mg/dL (ref 0.0–1.0)

## 2011-08-24 LAB — BASIC METABOLIC PANEL
GFR calc Af Amer: 45 mL/min — ABNORMAL LOW (ref >90)
GFR calc non Af Amer: 39 mL/min — ABNORMAL LOW (ref >90)
Potassium: 5.6 mEq/L — ABNORMAL HIGH (ref 3.5–5.1)
Sodium: 132 mEq/L — ABNORMAL LOW (ref 135–145)

## 2011-08-24 LAB — GLUCOSE, CAPILLARY
Glucose-Capillary: 149 mg/dL — ABNORMAL HIGH (ref 70–99)
Glucose-Capillary: 203 mg/dL — ABNORMAL HIGH (ref 70–99)

## 2011-08-24 LAB — GRAM STAIN

## 2011-08-24 LAB — TYPE AND SCREEN

## 2011-08-24 SURGERY — REMOVAL, TOTAL ARTHROPLASTY HARDWARE, KNEE, WITH ANTIBIOTIC SPACER INSERTION
Anesthesia: General | Site: Knee | Laterality: Left | Wound class: Dirty or Infected

## 2011-08-24 MED ORDER — DEXTROSE 50 % IV SOLN
12.5000 mL | Freq: Once | INTRAVENOUS | Status: AC
  Administered 2011-08-24: 12.5 mL via INTRAVENOUS

## 2011-08-24 MED ORDER — FENTANYL CITRATE 0.05 MG/ML IJ SOLN
INTRAMUSCULAR | Status: AC
  Filled 2011-08-24: qty 2

## 2011-08-24 MED ORDER — VANCOMYCIN HCL 1000 MG IV SOLR
1500.0000 mg | INTRAVENOUS | Status: DC | PRN
  Administered 2011-08-24: 1500 mg via INTRAVENOUS

## 2011-08-24 MED ORDER — FENTANYL CITRATE 0.05 MG/ML IJ SOLN
INTRAMUSCULAR | Status: DC | PRN
  Administered 2011-08-24: 100 ug via INTRAVENOUS

## 2011-08-24 MED ORDER — VANCOMYCIN HCL 500 MG IV SOLR
500.0000 mg | INTRAVENOUS | Status: DC
  Filled 2011-08-24: qty 500

## 2011-08-24 MED ORDER — METHOCARBAMOL 100 MG/ML IJ SOLN: 500.0000 mg | Freq: Four times a day (QID) | INTRAVENOUS | Status: DC | PRN

## 2011-08-24 MED ORDER — METOCLOPRAMIDE HCL 10 MG PO TABS: 5.0000 mg | ORAL_TABLET | Freq: Three times a day (TID) | ORAL | Status: DC | PRN

## 2011-08-24 MED ORDER — PANTOPRAZOLE SODIUM 40 MG PO TBEC
40.0000 mg | DELAYED_RELEASE_TABLET | Freq: Every day | ORAL | Status: DC
  Administered 2011-08-24 – 2011-08-27 (×3): 40 mg via ORAL
  Filled 2011-08-24 (×2): qty 1

## 2011-08-24 MED ORDER — LORAZEPAM 0.5 MG PO TABS: 0.5000 mg | ORAL_TABLET | Freq: Three times a day (TID) | ORAL | Status: DC | PRN

## 2011-08-24 MED ORDER — OXYCODONE HCL 5 MG PO TABS
5.0000 mg | ORAL_TABLET | ORAL | Status: DC | PRN
  Administered 2011-08-25 – 2011-08-26 (×3): 10 mg via ORAL
  Filled 2011-08-24 (×3): qty 2

## 2011-08-24 MED ORDER — ALUM & MAG HYDROXIDE-SIMETH 200-200-20 MG/5ML PO SUSP: 30.0000 mL | ORAL | Status: DC | PRN

## 2011-08-24 MED ORDER — RIVAROXABAN 10 MG PO TABS
10.0000 mg | ORAL_TABLET | Freq: Every day | ORAL | Status: DC
  Administered 2011-08-25 – 2011-08-27 (×3): 10 mg via ORAL
  Filled 2011-08-24 (×3): qty 1

## 2011-08-24 MED ORDER — KETOROLAC TROMETHAMINE 30 MG/ML IJ SOLN
15.0000 mg | Freq: Four times a day (QID) | INTRAMUSCULAR | Status: AC
  Administered 2011-08-24: 15 mg via INTRAVENOUS
  Filled 2011-08-24: qty 1

## 2011-08-24 MED ORDER — DOCUSATE SODIUM 100 MG PO CAPS
100.0000 mg | ORAL_CAPSULE | Freq: Two times a day (BID) | ORAL | Status: DC
  Administered 2011-08-24 – 2011-08-27 (×6): 100 mg via ORAL
  Filled 2011-08-24 (×7): qty 1

## 2011-08-24 MED ORDER — ROCURONIUM BROMIDE 100 MG/10ML IV SOLN
INTRAVENOUS | Status: DC | PRN
  Administered 2011-08-24: 50 mg via INTRAVENOUS

## 2011-08-24 MED ORDER — MIDAZOLAM HCL 2 MG/2ML IJ SOLN
INTRAMUSCULAR | Status: AC
  Filled 2011-08-24: qty 2

## 2011-08-24 MED ORDER — TOBRAMYCIN SULFATE 80 MG/2ML IJ SOLN
320.0000 mg | INTRAMUSCULAR | Status: DC
  Filled 2011-08-24: qty 8

## 2011-08-24 MED ORDER — INSULIN ASPART 100 UNIT/ML ~~LOC~~ SOLN
0.0000 [IU] | Freq: Three times a day (TID) | SUBCUTANEOUS | Status: DC
  Administered 2011-08-25: 5 [IU] via SUBCUTANEOUS
  Administered 2011-08-25: 3 [IU] via SUBCUTANEOUS
  Administered 2011-08-25: 2 [IU] via SUBCUTANEOUS
  Administered 2011-08-26: 5 [IU] via SUBCUTANEOUS
  Administered 2011-08-26: 3 [IU] via SUBCUTANEOUS
  Administered 2011-08-26: 2 [IU] via SUBCUTANEOUS
  Administered 2011-08-27 (×2): 3 [IU] via SUBCUTANEOUS

## 2011-08-24 MED ORDER — VANCOMYCIN HCL 1000 MG IV SOLR
1500.0000 mg | INTRAVENOUS | Status: DC
  Administered 2011-08-26: 1500 mg via INTRAVENOUS
  Filled 2011-08-24 (×3): qty 1500

## 2011-08-24 MED ORDER — ONDANSETRON HCL 4 MG PO TABS: 4.0000 mg | ORAL_TABLET | Freq: Four times a day (QID) | ORAL | Status: DC | PRN

## 2011-08-24 MED ORDER — HYDROCODONE-ACETAMINOPHEN 5-325 MG PO TABS: 1.0000 | ORAL_TABLET | ORAL | Status: DC | PRN

## 2011-08-24 MED ORDER — FLEET ENEMA 7-19 GM/118ML RE ENEM: 1.0000 | ENEMA | Freq: Once | RECTAL | Status: AC | PRN

## 2011-08-24 MED ORDER — TOBRAMYCIN SULFATE 80 MG/2ML IJ SOLN
INTRAMUSCULAR | Status: DC | PRN
  Administered 2011-08-24: 240 mg

## 2011-08-24 MED ORDER — MENTHOL 3 MG MT LOZG: 1.0000 | LOZENGE | OROMUCOSAL | Status: DC | PRN

## 2011-08-24 MED ORDER — LEVALBUTEROL HCL 0.63 MG/3ML IN NEBU
0.6300 mg | INHALATION_SOLUTION | Freq: Four times a day (QID) | RESPIRATORY_TRACT | Status: DC | PRN
  Filled 2011-08-24: qty 3

## 2011-08-24 MED ORDER — TOBRAMYCIN SULFATE 1.2 G IJ SOLR
INTRAMUSCULAR | Status: DC | PRN
  Administered 2011-08-24: 4.8 g
  Administered 2011-08-24: 2.4 g

## 2011-08-24 MED ORDER — VANCOMYCIN HCL 1000 MG IV SOLR
4000.0000 mg | INTRAVENOUS | Status: DC
  Filled 2011-08-24: qty 4000

## 2011-08-24 MED ORDER — ROPIVACAINE HCL 5 MG/ML IJ SOLN
INTRAMUSCULAR | Status: DC | PRN
  Administered 2011-08-24: 30 mL

## 2011-08-24 MED ORDER — TOBRAMYCIN SULFATE 1.2 G IJ SOLR
2.4000 g | INTRAMUSCULAR | Status: DC
  Filled 2011-08-24: qty 2.4

## 2011-08-24 MED ORDER — MIDAZOLAM HCL 2 MG/2ML IJ SOLN
1.0000 mg | INTRAMUSCULAR | Status: DC | PRN
  Administered 2011-08-24: 2 mg via INTRAVENOUS

## 2011-08-24 MED ORDER — PAROXETINE HCL 10 MG PO TABS
10.0000 mg | ORAL_TABLET | Freq: Every day | ORAL | Status: DC
  Administered 2011-08-25 – 2011-08-26 (×2): 10 mg via ORAL
  Filled 2011-08-24 (×3): qty 1

## 2011-08-24 MED ORDER — BISACODYL 10 MG RE SUPP: 10.0000 mg | Freq: Every day | RECTAL | Status: DC | PRN

## 2011-08-24 MED ORDER — DILTIAZEM HCL ER COATED BEADS 240 MG PO CP24
240.0000 mg | ORAL_CAPSULE | Freq: Every day | ORAL | Status: DC
  Administered 2011-08-26: 240 mg via ORAL
  Filled 2011-08-24 (×3): qty 1

## 2011-08-24 MED ORDER — INSULIN REGULAR HUMAN 100 UNIT/ML IJ SOLN
10.0000 [IU] | Freq: Once | INTRAMUSCULAR | Status: AC
  Administered 2011-08-24: 10 [IU] via INTRAVENOUS
  Filled 2011-08-24: qty 0.1

## 2011-08-24 MED ORDER — METOPROLOL TARTRATE 25 MG PO TABS
25.0000 mg | ORAL_TABLET | Freq: Two times a day (BID) | ORAL | Status: DC
  Administered 2011-08-24 – 2011-08-27 (×6): 25 mg via ORAL
  Filled 2011-08-24 (×7): qty 1

## 2011-08-24 MED ORDER — ACETAMINOPHEN 10 MG/ML IV SOLN: 1000.0000 mg | Freq: Four times a day (QID) | INTRAVENOUS | Status: DC

## 2011-08-24 MED ORDER — PROPOFOL 10 MG/ML IV EMUL
INTRAVENOUS | Status: DC | PRN
  Administered 2011-08-24: 140 mg via INTRAVENOUS

## 2011-08-24 MED ORDER — MIDAZOLAM HCL 5 MG/5ML IJ SOLN
INTRAMUSCULAR | Status: DC | PRN
  Administered 2011-08-24: 1 mg via INTRAVENOUS

## 2011-08-24 MED ORDER — PHENOL 1.4 % MT LIQD: 1.0000 | OROMUCOSAL | Status: DC | PRN

## 2011-08-24 MED ORDER — OMEGA-3-ACID ETHYL ESTERS 1 G PO CAPS
4.0000 g | ORAL_CAPSULE | Freq: Every day | ORAL | Status: DC
  Administered 2011-08-25 – 2011-08-27 (×3): 4 g via ORAL
  Filled 2011-08-24 (×3): qty 4

## 2011-08-24 MED ORDER — ONDANSETRON HCL 4 MG/2ML IJ SOLN: 4.0000 mg | Freq: Four times a day (QID) | INTRAMUSCULAR | Status: DC | PRN

## 2011-08-24 MED ORDER — VANCOMYCIN HCL 1000 MG IV SOLR
2000.0000 mg | INTRAVENOUS | Status: DC
  Filled 2011-08-24: qty 2000

## 2011-08-24 MED ORDER — LIDOCAINE HCL (CARDIAC) 20 MG/ML IV SOLN
INTRAVENOUS | Status: DC | PRN
  Administered 2011-08-24: 50 mg via INTRAVENOUS

## 2011-08-24 MED ORDER — VANCOMYCIN HCL 1000 MG IV SOLR
1500.0000 mg | INTRAVENOUS | Status: DC
  Filled 2011-08-24 (×2): qty 1500

## 2011-08-24 MED ORDER — VANCOMYCIN HCL 500 MG IV SOLR
INTRAVENOUS | Status: DC | PRN
  Administered 2011-08-24: 500 mg
  Administered 2011-08-24: 4 g
  Administered 2011-08-24: 2 g

## 2011-08-24 MED ORDER — MORPHINE SULFATE 2 MG/ML IJ SOLN
1.0000 mg | INTRAMUSCULAR | Status: DC | PRN
  Administered 2011-08-24 (×2): 1 mg via INTRAVENOUS

## 2011-08-24 MED ORDER — TRAZODONE 25 MG HALF TABLET
25.0000 mg | ORAL_TABLET | Freq: Every evening | ORAL | Status: DC | PRN
  Filled 2011-08-24: qty 1

## 2011-08-24 MED ORDER — SODIUM CHLORIDE 0.9 % IV SOLN: INTRAVENOUS | Status: DC

## 2011-08-24 MED ORDER — EZETIMIBE-SIMVASTATIN 10-40 MG PO TABS
1.0000 | ORAL_TABLET | Freq: Every day | ORAL | Status: DC
  Filled 2011-08-24: qty 1

## 2011-08-24 MED ORDER — LACTATED RINGERS IV SOLN
INTRAVENOUS | Status: DC
  Administered 2011-08-24: 14:00:00 via INTRAVENOUS

## 2011-08-24 MED ORDER — ACETAMINOPHEN 10 MG/ML IV SOLN
INTRAVENOUS | Status: AC
  Filled 2011-08-24: qty 100

## 2011-08-24 MED ORDER — METHOCARBAMOL 500 MG PO TABS
500.0000 mg | ORAL_TABLET | Freq: Four times a day (QID) | ORAL | Status: DC | PRN
  Administered 2011-08-25 – 2011-08-26 (×2): 500 mg via ORAL
  Filled 2011-08-24 (×2): qty 1

## 2011-08-24 MED ORDER — PAROXETINE HCL 10 MG PO TABS
20.0000 mg | ORAL_TABLET | Freq: Every day | ORAL | Status: DC
  Administered 2011-08-25 – 2011-08-27 (×3): 20 mg via ORAL
  Filled 2011-08-24 (×3): qty 2

## 2011-08-24 MED ORDER — PHENYLEPHRINE HCL 10 MG/ML IJ SOLN
INTRAMUSCULAR | Status: DC | PRN
  Administered 2011-08-24: 40 ug via INTRAVENOUS
  Administered 2011-08-24: 80 ug via INTRAVENOUS

## 2011-08-24 MED ORDER — SODIUM CHLORIDE 0.9 % IR SOLN
Status: DC | PRN
  Administered 2011-08-24: 3000 mL
  Administered 2011-08-24: 1000 mL

## 2011-08-24 MED ORDER — SENNOSIDES-DOCUSATE SODIUM 8.6-50 MG PO TABS: 1.0000 | ORAL_TABLET | Freq: Every evening | ORAL | Status: DC | PRN

## 2011-08-24 MED ORDER — SODIUM CHLORIDE 0.9 % IV SOLN
INTRAVENOUS | Status: DC | PRN
  Administered 2011-08-24 (×2): via INTRAVENOUS

## 2011-08-24 MED ORDER — ONDANSETRON HCL 4 MG/2ML IJ SOLN
4.0000 mg | Freq: Once | INTRAMUSCULAR | Status: DC | PRN
Start: 2011-08-24 — End: 2011-08-24

## 2011-08-24 MED ORDER — MORPHINE SULFATE 2 MG/ML IJ SOLN
INTRAMUSCULAR | Status: AC
  Filled 2011-08-24: qty 1

## 2011-08-24 MED ORDER — INSULIN ASPART 100 UNIT/ML ~~LOC~~ SOLN: 0.0000 [IU] | Freq: Every day | SUBCUTANEOUS | Status: DC

## 2011-08-24 MED ORDER — MORPHINE SULFATE 2 MG/ML IJ SOLN
1.0000 mg | INTRAMUSCULAR | Status: DC | PRN
  Administered 2011-08-24 – 2011-08-25 (×2): 1 mg via INTRAVENOUS
  Filled 2011-08-24 (×2): qty 1

## 2011-08-24 MED ORDER — LOSARTAN POTASSIUM 50 MG PO TABS
100.0000 mg | ORAL_TABLET | Freq: Every day | ORAL | Status: DC
  Filled 2011-08-24: qty 2

## 2011-08-24 MED ORDER — BUPIVACAINE-EPINEPHRINE 0.25% -1:200000 IJ SOLN
INTRAMUSCULAR | Status: DC | PRN
  Administered 2011-08-24: 30 mL

## 2011-08-24 MED ORDER — LACTATED RINGERS IV SOLN: INTRAVENOUS | Status: DC

## 2011-08-24 MED ORDER — GLYCOPYRROLATE 0.2 MG/ML IJ SOLN
INTRAMUSCULAR | Status: DC | PRN
  Administered 2011-08-24: .6 mg via INTRAVENOUS

## 2011-08-24 MED ORDER — LACTATED RINGERS IV SOLN
INTRAVENOUS | Status: DC | PRN
  Administered 2011-08-24: 15:00:00 via INTRAVENOUS

## 2011-08-24 MED ORDER — METOCLOPRAMIDE HCL 5 MG/ML IJ SOLN: 5.0000 mg | Freq: Three times a day (TID) | INTRAMUSCULAR | Status: DC | PRN

## 2011-08-24 SURGICAL SUPPLY — 75 items
24 INCH KNEE IMMOBILIZER ×1 IMPLANT
BANDAGE ESMARK 6X9 LF (GAUZE/BANDAGES/DRESSINGS) ×1 IMPLANT
BLADE SAGITTAL 25.0X1.19X90 (BLADE) ×3 IMPLANT
BNDG CMPR 9X6 STRL LF SNTH (GAUZE/BANDAGES/DRESSINGS) ×1
BNDG CMPR MED 15X6 ELC VLCR LF (GAUZE/BANDAGES/DRESSINGS)
BNDG ELASTIC 6X15 VLCR STRL LF (GAUZE/BANDAGES/DRESSINGS) IMPLANT
BNDG ESMARK 6X9 LF (GAUZE/BANDAGES/DRESSINGS) ×2
BOWL SMART MIX CTS (DISPOSABLE) ×4 IMPLANT
CEMENT HV SMART SET (Cement) ×12 IMPLANT
CLOTH BEACON ORANGE TIMEOUT ST (SAFETY) ×2 IMPLANT
COVER BACK TABLE 24X17X13 BIG (DRAPES) ×2 IMPLANT
COVER SURGICAL LIGHT HANDLE (MISCELLANEOUS) ×2 IMPLANT
CUFF TOURNIQUET SINGLE 34IN LL (TOURNIQUET CUFF) IMPLANT
CUFF TOURNIQUET SINGLE 44IN (TOURNIQUET CUFF) IMPLANT
DRAPE EXTREMITY T 121X128X90 (DRAPE) ×2 IMPLANT
DRAPE PROXIMA HALF (DRAPES) ×2 IMPLANT
DRSG ADAPTIC 3X8 NADH LF (GAUZE/BANDAGES/DRESSINGS) ×3 IMPLANT
DRSG PAD ABDOMINAL 8X10 ST (GAUZE/BANDAGES/DRESSINGS) ×4 IMPLANT
DURAPREP 26ML APPLICATOR (WOUND CARE) ×2 IMPLANT
ELECT CAUTERY BLADE 6.4 (BLADE) ×2 IMPLANT
ELECT REM PT RETURN 9FT ADLT (ELECTROSURGICAL) ×2
ELECTRODE REM PT RTRN 9FT ADLT (ELECTROSURGICAL) ×1 IMPLANT
EVACUATOR 1/8 PVC DRAIN (DRAIN) ×1 IMPLANT
FACESHIELD LNG OPTICON STERILE (SAFETY) ×4 IMPLANT
FLOSEAL 10ML (HEMOSTASIS) ×1 IMPLANT
GLOVE BIO SURGEON STRL SZ7 (GLOVE) ×2 IMPLANT
GLOVE BIOGEL PI IND STRL 8 (GLOVE) ×1 IMPLANT
GLOVE BIOGEL PI IND STRL 8.5 (GLOVE) ×1 IMPLANT
GLOVE BIOGEL PI INDICATOR 8 (GLOVE) ×1
GLOVE BIOGEL PI INDICATOR 8.5 (GLOVE) ×1
GLOVE BIOGEL PI ORTHO PRO SZ7 (GLOVE) ×2
GLOVE ECLIPSE 8.0 STRL XLNG CF (GLOVE) ×7 IMPLANT
GLOVE PI ORTHO PRO STRL SZ7 (GLOVE) ×2 IMPLANT
GLOVE SURG ORTHO 8.5 STRL (GLOVE) ×2 IMPLANT
GOWN PREVENTION PLUS XLARGE (GOWN DISPOSABLE) ×1 IMPLANT
GOWN STRL NON-REIN LRG LVL3 (GOWN DISPOSABLE) ×2 IMPLANT
HANDPIECE INTERPULSE COAX TIP (DISPOSABLE) ×2
INSERT LCS COM RP STDPLUS 17.5 (Knees) ×1 IMPLANT
KIT BASIN OR (CUSTOM PROCEDURE TRAY) ×2 IMPLANT
KIT ROOM TURNOVER OR (KITS) ×2 IMPLANT
KIT STIMULAN RAPID CURE  10CC (Orthopedic Implant) ×1 IMPLANT
KIT STIMULAN RAPID CURE 10CC (Orthopedic Implant) IMPLANT
MANIFOLD NEPTUNE II (INSTRUMENTS) ×2 IMPLANT
MARKER SPHERE PSV REFLC THRD 5 (MARKER) ×6 IMPLANT
NEEDLE 22X1 1/2 (OR ONLY) (NEEDLE) IMPLANT
NS IRRIG 1000ML POUR BTL (IV SOLUTION) ×2 IMPLANT
PACK TOTAL JOINT (CUSTOM PROCEDURE TRAY) ×2 IMPLANT
PAD ARMBOARD 7.5X6 YLW CONV (MISCELLANEOUS) ×4 IMPLANT
PAD CAST 4YDX4 CTTN HI CHSV (CAST SUPPLIES) ×1 IMPLANT
PADDING CAST ABS 4INX4YD NS (CAST SUPPLIES) ×1
PADDING CAST ABS 6INX4YD NS (CAST SUPPLIES) ×1
PADDING CAST ABS COTTON 4X4 ST (CAST SUPPLIES) IMPLANT
PADDING CAST ABS COTTON 6X4 NS (CAST SUPPLIES) IMPLANT
PADDING CAST COTTON 4X4 STRL (CAST SUPPLIES) ×2
PADDING CAST COTTON 6X4 STRL (CAST SUPPLIES) ×2 IMPLANT
PIN SCHANZ 4MM 130MM (PIN) IMPLANT
SET HNDPC FAN SPRY TIP SCT (DISPOSABLE) ×1 IMPLANT
SPONGE GAUZE 4X4 12PLY (GAUZE/BANDAGES/DRESSINGS) ×2 IMPLANT
SPONGE LAP 18X18 X RAY DECT (DISPOSABLE) ×2 IMPLANT
STAPLER VISISTAT 35W (STAPLE) ×2 IMPLANT
SUCTION FRAZIER TIP 10 FR DISP (SUCTIONS) ×2 IMPLANT
SUT BONE WAX W31G (SUTURE) ×1 IMPLANT
SUT ETHIBOND NAB CT1 #1 30IN (SUTURE) ×4 IMPLANT
SUT MNCRL AB 3-0 PS2 18 (SUTURE) ×2 IMPLANT
SUT PDS AB 2-0 CT1 27 (SUTURE) ×4 IMPLANT
SUT VIC AB 0 CT1 27 (SUTURE) ×2
SUT VIC AB 0 CT1 27XBRD ANBCTR (SUTURE) ×1 IMPLANT
SUT VIC AB 1 CT1 27 (SUTURE) ×4
SUT VIC AB 1 CT1 27XBRD ANBCTR (SUTURE) ×2 IMPLANT
SYR CONTROL 10ML LL (SYRINGE) IMPLANT
TOPICAL MINERAL OIL ×1 IMPLANT
TOWEL OR 17X24 6PK STRL BLUE (TOWEL DISPOSABLE) IMPLANT
TOWEL OR 17X26 10 PK STRL BLUE (TOWEL DISPOSABLE) ×1 IMPLANT
TRAY FOLEY CATH 14FR (SET/KITS/TRAYS/PACK) IMPLANT
WATER STERILE IRR 1000ML POUR (IV SOLUTION) ×6 IMPLANT

## 2011-08-24 NOTE — Anesthesia Preprocedure Evaluation (Addendum)
Anesthesia Evaluation  Patient identified by MRN, date of birth, ID band Patient awake    Reviewed: Allergy & Precautions, H&P , NPO status , Patient's Chart, lab work & pertinent test results, reviewed documented beta blocker date and time   Airway Mallampati: II TM Distance: >3 FB Neck ROM: full    Dental No notable dental hx. (+) Edentulous Upper and Edentulous Lower   Pulmonary neg pulmonary ROS, former smoker breath sounds clear to auscultation  Pulmonary exam normal       Cardiovascular Exercise Tolerance: Good hypertension, Pt. on medications + CAD negative cardio ROS  + dysrhythmias Atrial Fibrillation Rhythm:regular Rate:Normal     Neuro/Psych PSYCHIATRIC DISORDERS Depression CVA, No Residual Symptoms negative neurological ROS  negative psych ROS   GI/Hepatic negative GI ROS, Neg liver ROS, PUD, GERD-  ,(+)     substance abuse (distant h/o EtOH abuse)   ,   Endo/Other  negative endocrine ROSDiabetes mellitus-, Poorly Controlled, Type 2, Insulin DependentMorbid obesity  Renal/GU Renal Insufficiency and ARFRenal diseasenegative Renal ROS  negative genitourinary   Musculoskeletal   Abdominal   Peds  Hematology negative hematology ROS (+) Blood dyscrasia, anemia ,   Anesthesia Other Findings   Reproductive/Obstetrics negative OB ROS                         Anesthesia Physical  Anesthesia Plan  ASA: III  Anesthesia Plan: General   Post-op Pain Management:    Induction: Intravenous  Airway Management Planned: Oral ETT  Additional Equipment:   Intra-op Plan:   Post-operative Plan: Possible Post-op intubation/ventilation  Informed Consent: I have reviewed the patients History and Physical, chart, labs and discussed the procedure including the risks, benefits and alternatives for the proposed anesthesia with the patient or authorized representative who has indicated his/her  understanding and acceptance.   Dental Advisory Given  Plan Discussed with: CRNA and Surgeon  Anesthesia Plan Comments: (Previous anesthetic - unable to extubate. Sent to ICU on ventilator 3/12)       Anesthesia Quick Evaluation

## 2011-08-24 NOTE — Progress Notes (Signed)
Orthopedic Tech Progress Note Patient Details:  [name] 1940/06/10 [id number]  CPM Left Knee CPM Left Knee: On Left Knee Flexion (Degrees): 20  Left Knee Extension (Degrees): 0  Additional Comments: trapeze bar patient helper   Nikki Dom 08/24/2011, 8:07 PM

## 2011-08-24 NOTE — Preoperative (Signed)
Beta Blockers   Reason not to administer Beta Blockers:Not Applicable 

## 2011-08-24 NOTE — Anesthesia Postprocedure Evaluation (Signed)
  Anesthesia Post-op Note  Patient: [name]  Procedure(s) Performed: Procedure(s) (LRB): EXCISIONAL TOTAL KNEE ARTHROPLASTY WITH ANTIBIOTIC SPACERS (Left)  Patient Location: PACU  Anesthesia Type: GA combined with regional for post-op pain  Level of Consciousness: awake  Airway and Oxygen Therapy: Patient Spontanous Breathing and Patient connected to nasal cannula oxygen  Post-op Pain: mild  Post-op Assessment: Post-op Vital signs reviewed, Patient's Cardiovascular Status Stable, Respiratory Function Stable, Patent Airway and No signs of Nausea or vomiting  Post-op Vital Signs: Reviewed and stable  Complications: No apparent anesthesia complications

## 2011-08-24 NOTE — Transfer of Care (Signed)
Immediate Anesthesia Transfer of Care Note  Patient: [name]  Procedure(s) Performed: Procedure(s) (LRB): EXCISIONAL TOTAL KNEE ARTHROPLASTY WITH ANTIBIOTIC SPACERS (Left)  Patient Location: PACU  Anesthesia Type: General and GA combined with regional for post-op pain  Level of Consciousness: awake  Airway & Oxygen Therapy: Patient Spontanous Breathing and Patient connected to nasal cannula oxygen  Post-op Assessment: Report given to PACU RN and Post -op Vital signs reviewed and stable  Post vital signs: Reviewed and stable  Complications: No apparent anesthesia complications

## 2011-08-24 NOTE — Consult Note (Signed)
Date of Admission:  08/24/2011  Date of Consult:  08/24/2011  Reason for Consult:Septic Arthtiritis Referring Physician: Cleophas Dunker  Impression/Recommendation Septic Arthritis Osteomyelitis Anemia CKD (Cr 1.69 today) Will- place him on vancomycin while we are awaiting his Cx. He does not need rifampin now that his prosthesis is out.  Comment- difficult case that has required prolonged anbx. Will leave up to ortho if re-implantation is in his future. Would not stop his anbx til he has this if planned.   [name] is an 71 y.o. male.  HPI: 71 yo M with hx DM and CKD (stage 3), CABG (stenting 1992?) and L total knee replacement ("2 years ago"). He did well with his TKR until being admitted for bronchitis in early March. After d/c he developed swelling in his knee and had an aspiration showing frank pus. He was  then admitted with MSSA bacteremia and septic joint June 11, 2011. He underwent exchange of parts and was treated with IV ancef and oral rifampin. ESR 139 at that time. At ID f/u on 4-4 he was changed to po keflex and rifampin (he had already completed IV at this point).  Since his ID f/u has had further knee aspirations, each showing purulent fluid. He was admitted today and underwent resection of his prosthesis with placement of antibiotic spacer and beads.     Past Medical History  Diagnosis Date  . Gout   . Depression   . Diabetes mellitus   . Unspecified essential hypertension   . Hypercholesteremia     IIA  . Colitis, ulcerative     NOS  . Personal hx-rectal/anal malignancy   . Urinary incontinence   . Benign prostatic hypertrophy     S/p TURP   . H/O ETOH abuse 06/06/2011  . 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 History of left knee surgery   . Pleurisy   . AAA (abdominal aortic aneurysm) 1992    S/p repair and grafting  . History of tobacco abuse   . History of alcohol abuse   . CAD (coronary artery disease)   .  Stroke     71 yrs old  . Bronchitis   . Stones in the urinary tract   . GERD (gastroesophageal reflux disease)   . Arthritis   . Normocytic anemia     Chronic  . Cancer 2006    bladder  . Bladder cancer     Transitional Cell  . PAF (paroxysmal atrial fibrillation)     during hospitalization 06/2011 for sepsis  . Cardiomyopathy, ischemic     EF 40-45% by echo 06/2011    Past Surgical History  Procedure Date  . Colon resection   . Transurethral resection of prostate   . Abdominal aortic aneurysm repair 1992  . I&d knee with poly exchange 06/12/2011    Procedure: IRRIGATION AND DEBRIDEMENT KNEE WITH POLY EXCHANGE;  Surgeon: Thera Flake., MD;  Location: [facility];  Service: Orthopedics;  Laterality: Left;  ergies:   Allergies  Allergen Reactions  . Fentanyl     Delirium  . Hydromorphone Hcl     Delirium  . Lorazepam     Delirium  . Oxycodone-Acetaminophen     Delirium    Medications:  Scheduled:   . acetaminophen  1,000 mg Intravenous Once  . acetaminophen  1,000 mg Intravenous Q6H  . dextrose  12.5 mL Intravenous Once  . insulin regular  10 Units Intravenous Once  .  ketorolac  15 mg Intravenous Q6H  . morphine      . tobramycin  320 mg Intramuscular To OR  . tobramycin  2.4 g Topical To OR  . vancomycin (VANCOCIN) 1500 mg IVPB  1,500 mg Intravenous To OR  . vancomycin  2,000 mg Other To OR  . vancomycin  4,000 mg Other To OR  . vancomycin  500 mg Other To OR    Social History:  reports that he quit smoking about 41 years ago. His smoking use included Cigarettes. He has a 30 pack-year smoking history. His smokeless tobacco use includes Chew. He reports that he does not drink alcohol or use illicit drugs.  Family History  Problem Relation Age of Onset  . Dementia Mother   . COPD Father   . Anesthesia problems Neg Hx     General ROS: no change in vision, states his FSG runs "good", no difficulty with urine, normal BM, denies neuropathy, denies f/c.   Blood  pressure 102/54, pulse 60, temperature 98.3 F (36.8 C), temperature source Oral, resp. rate 14, height 6\' 1"  (1.854 m), weight 112.038 kg (247 lb), SpO2 100.00%. General appearance: alert, cooperative and no distress Eyes: negative findings: conjunctivae and sclerae normal and pupils equal, round, reactive to light and accomodation Throat: throat is dry Lungs: clear to auscultation bilaterally Heart: regular rate and rhythm Abdomen: normal findings: bowel sounds normal and soft, non-tender Extremities: LLE is wrapped.  Skin: onychomycosis Neurologic: Sensory: normal, grossly normal in LE   Results for orders placed during the hospital encounter of 08/24/11 (from the past 48 hour(s))  GLUCOSE, CAPILLARY     Status: Abnormal   Collection Time   08/24/11 12:16 PM      Component Value Range Comment   Glucose-Capillary 149 (*) 70 - 99 (mg/dL)   URINALYSIS, WITH MICROSCOPIC     Status: Abnormal   Collection Time   08/24/11 12:36 PM      Component Value Range Comment   Color, Urine YELLOW  YELLOW     APPearance CLEAR  CLEAR     Specific Gravity, Urine 1.014  1.005 - 1.030     pH 5.0  5.0 - 8.0     Glucose, UA NEGATIVE  NEGATIVE (mg/dL)    Hgb urine dipstick NEGATIVE  NEGATIVE     Bilirubin Urine NEGATIVE  NEGATIVE     Ketones, ur NEGATIVE  NEGATIVE (mg/dL)    Protein, ur NEGATIVE  NEGATIVE (mg/dL)    Urobilinogen, UA 0.2  0.0 - 1.0 (mg/dL)    Nitrite NEGATIVE  NEGATIVE     Leukocytes, UA TRACE (*) NEGATIVE     WBC, UA 3-6  <3 (WBC/hpf)    RBC / HPF 0-2  <3 (RBC/hpf)    Bacteria, UA FEW (*) RARE     Squamous Epithelial / LPF FEW (*) RARE     Casts HYALINE CASTS (*) NEGATIVE    BASIC METABOLIC PANEL     Status: Abnormal   Collection Time   08/24/11 12:44 PM      Component Value Range Comment   Sodium 132 (*) 135 - 145 (mEq/L)    Potassium 5.6 (*) 3.5 - 5.1 (mEq/L)    Chloride 99  96 - 112 (mEq/L)    CO2 21  19 - 32 (mEq/L)    Glucose, Bld 183 (*) 70 - 99 (mg/dL)    BUN 23  6 -  23 (mg/dL)    Creatinine, Ser 1.61 (*) 0.50 - 1.35 (mg/dL)  Calcium 10.1  8.4 - 10.5 (mg/dL)    GFR calc non Af Amer 39 (*) >90 (mL/min)    GFR calc Af Amer 45 (*) >90 (mL/min)   TYPE AND SCREEN     Status: Normal   Collection Time   08/24/11 12:45 PM      Component Value Range Comment   ABO/RH(D) A NEG      Antibody Screen NEG      Sample Expiration 08/27/2011     GRAM STAIN     Status: Normal   Collection Time   08/24/11  3:33 PM      Component Value Range Comment   Specimen Description SYNOVIAL FLUID KNEE LEFT      Special Requests SWAB RECIEVED NO 1      Gram Stain        Value: ABUNDANT WBC PRESENT,BOTH PMN AND MONONUCLEAR     NO ORGANISMS SEEN   Report Status 08/24/2011 FINAL     GRAM STAIN     Status: Normal   Collection Time   08/24/11  3:35 PM      Component Value Range Comment   Specimen Description TISSUE KNEE LEFT      Special Requests NO 2      Gram Stain        Value: ABUNDANT WBC PRESENT,BOTH PMN AND MONONUCLEAR     NO ORGANISMS SEEN   Report Status 08/24/2011 FINAL     GLUCOSE, CAPILLARY     Status: Abnormal   Collection Time   08/24/11  6:10 PM      Component Value Range Comment   Glucose-Capillary 157 (*) 70 - 99 (mg/dL)       Component Value Date/Time   SDES TISSUE KNEE LEFT 08/24/2011 1535   SPECREQUEST NO 2 08/24/2011 1535   CULT NO GROWTH 2 DAYS 06/21/2011 1836   REPTSTATUS 08/24/2011 FINAL 08/24/2011 1535   No results found.  Thank you so much for this interesting consult,   Johny Sax [phone] 08/24/2011, 6:18 PM     LOS: 0 days

## 2011-08-24 NOTE — Anesthesia Procedure Notes (Addendum)
Anesthesia Regional Block:  Femoral nerve block  Pre-Anesthetic Checklist: ,, timeout performed, Correct Patient, Correct Site, Correct Laterality, Correct Procedure, Correct Position, site marked, Risks and benefits discussed,  Surgical consent,  Pre-op evaluation,  At surgeon's request and post-op pain management  Laterality: Left  Prep: chloraprep       Needles:  Injection technique: Single-shot  Needle Type: Stimiplex     Needle Length: 10cm 10 cm     Additional Needles:  Procedures: ultrasound guided and nerve stimulator Femoral nerve block Narrative:  Injection made incrementally with aspirations every 5 mL.  Performed by: Personally  Anesthesiologist: Phillips Grout MD  Additional Notes: Patient tolerated the procedure well without complications  Femoral nerve block Procedure Name: Intubation Date/Time: 08/24/2011 2:52 PM Performed by: Gwenyth Allegra Pre-anesthesia Checklist: Patient identified, Timeout performed, Emergency Drugs available, Suction available and Patient being monitored Patient Re-evaluated:Patient Re-evaluated prior to inductionOxygen Delivery Method: Circle system utilized Preoxygenation: Pre-oxygenation with 100% oxygen Intubation Type: IV induction Ventilation: Oral airway inserted - appropriate to patient size Laryngoscope Size: Mac and 4 Grade View: Grade I Tube type: Oral Tube size: 8.0 mm Number of attempts: 1 Airway Equipment and Method: Stylet Placement Confirmation: breath sounds checked- equal and bilateral,  positive ETCO2 and ETT inserted through vocal cords under direct vision Secured at: 21 cm Tube secured with: Tape

## 2011-08-24 NOTE — Progress Notes (Signed)
Orthopedic Tech Progress Note Patient Details:  [name] 1940/05/05 [id number]  Patient ID: [name], male   DOB: Feb 05, 1941, 71 y.o.   MRN: [id number] Viewed order from rn order list  Nikki Dom 08/24/2011, 8:07 PM

## 2011-08-24 NOTE — Progress Notes (Signed)
ANTIBIOTIC CONSULT NOTE - INITIAL  Pharmacy Consult for Vancomycin Indication: Septic arthritis  Allergies  Allergen Reactions  . Fentanyl     Delirium  . Hydromorphone Hcl     Delirium  . Lorazepam     Delirium  . Oxycodone-Acetaminophen     Delirium    Patient Measurements: Height: 6\' 1"  (185.4 cm) Weight: 247 lb (112.038 kg) IBW/kg (Calculated) : 79.9   Vital Signs: Temp: 98.3 F (36.8 C) (05/21 1210) Temp src: Oral (05/21 1210) BP: 102/54 mmHg (05/21 1210) Pulse Rate: 60  (05/21 1400) Intake/Output from previous day:   Intake/Output from this shift: Total I/O In: 2100 [I.V.:2100] Out: 230 [Urine:230]  Labs:  [facility] 08/24/11 1244  WBC --  HGB --  PLT --  LABCREA --  CREATININE 1.69*   Estimated Creatinine Clearance: 51.8 ml/min (by C-G formula based on Cr of 1.69). No results found for this basename: VANCOTROUGH:2,VANCOPEAK:2,VANCORANDOM:2,GENTTROUGH:2,GENTPEAK:2,GENTRANDOM:2,TOBRATROUGH:2,TOBRAPEAK:2,TOBRARND:2,AMIKACINPEAK:2,AMIKACINTROU:2,AMIKACIN:2, in the last 72 hours   Microbiology: Recent Results (from the past 720 hour(s))  SURGICAL PCR SCREEN     Status: Normal   Collection Time   08/10/11 12:45 PM      Component Value Range Status Comment   MRSA, PCR NEGATIVE  NEGATIVE  Final    Staphylococcus aureus NEGATIVE  NEGATIVE  Final   GRAM STAIN     Status: Normal   Collection Time   08/24/11  3:33 PM      Component Value Range Status Comment   Specimen Description SYNOVIAL FLUID KNEE LEFT   Final    Special Requests SWAB RECIEVED NO 1   Final    Gram Stain     Final    Value: ABUNDANT WBC PRESENT,BOTH PMN AND MONONUCLEAR     NO ORGANISMS SEEN   Report Status 08/24/2011 FINAL   Final   GRAM STAIN     Status: Normal   Collection Time   08/24/11  3:35 PM      Component Value Range Status Comment   Specimen Description TISSUE KNEE LEFT   Final    Special Requests NO 2   Final    Gram Stain     Final    Value: ABUNDANT WBC PRESENT,BOTH PMN AND  MONONUCLEAR     NO ORGANISMS SEEN   Report Status 08/24/2011 FINAL   Final     Medical History: Past Medical History  Diagnosis Date  . Gout   . Depression   . Diabetes mellitus   . Unspecified essential hypertension   . Hypercholesteremia     IIA  . Colitis, ulcerative     NOS  . Personal hx-rectal/anal malignancy   . Urinary incontinence   . Benign prostatic hypertrophy     S/p TURP   . H/O ETOH abuse 06/06/2011  . 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 History of left knee surgery   . Pleurisy   . AAA (abdominal aortic aneurysm) 1992    S/p repair and grafting  . History of tobacco abuse   . History of alcohol abuse   . CAD (coronary artery disease)   . Stroke     71 yrs old  . Bronchitis   . Stones in the urinary tract   . GERD (gastroesophageal reflux disease)   . Arthritis   . Normocytic anemia     Chronic  . Cancer 2006    bladder  . Bladder cancer     Transitional Cell  . PAF (paroxysmal atrial fibrillation)  during hospitalization 06/2011 for sepsis  . Cardiomyopathy, ischemic     EF 40-45% by echo 06/2011    Medications:  Prescriptions prior to admission  Medication Sig Dispense Refill  . aspirin 81 MG tablet Take 81 mg by mouth daily.       Marland Kitchen CINNAMON PO Take 1,000 mg by mouth daily.      Marland Kitchen diltiazem (CARDIZEM CD) 240 MG 24 hr capsule Take 240 mg by mouth daily.      Marland Kitchen HYDROcodone-acetaminophen (NORCO) 5-325 MG per tablet Take 1-2 tablets by mouth every 4 (four) hours as needed. pain      . insulin aspart (NOVOLOG) 100 UNIT/ML injection Inject into the skin 3 (three) times daily before meals. 16units am, 20 units noon, 14 units pm      . insulin glargine (LANTUS) 100 UNIT/ML injection Inject 10 Units into the skin at bedtime.      Marland Kitchen LORazepam (ATIVAN) 0.5 MG tablet Take 0.5 mg by mouth every 8 (eight) hours as needed.      Marland Kitchen losartan (COZAAR) 100 MG tablet Take 100 mg by mouth daily.      . metoprolol  tartrate (LOPRESSOR) 25 MG tablet Take 25 mg by mouth 2 (two) times daily.      Marland Kitchen omega-3 acid ethyl esters (LOVAZA) 1 G capsule Take 4 g by mouth daily.      . pantoprazole (PROTONIX) 40 MG tablet Take 40 mg by mouth daily at 12 noon.      Marland Kitchen PARoxetine (PAXIL) 20 MG tablet Take 10-20 mg by mouth 2 (two) times daily. 1 tab in am, 0.5 tab in pm      . traZODone (DESYREL) 25 mg TABS Take 25 mg by mouth at bedtime as needed. sleep      . ezetimibe-simvastatin (VYTORIN) 10-40 MG per tablet Take 1 tablet by mouth at bedtime.      . levalbuterol (XOPENEX) 0.63 MG/3ML nebulizer solution Take 0.63 mg by nebulization every 6 (six) hours as needed. Shortness of breath      . Liraglutide 18 MG/3ML SOLN Inject into the skin. 1.2 mg daily am      . Multiple Vitamin (MULTIVITAMIN) tablet Take 1 tablet by mouth daily.         Assessment: 72 yo with hx of L TKR. He was treated for MSSA bacteremia and septic joint in March of this year. He underwent prothesis exchange at that time, he has continued to have purulent knee aspirations, and is s/p resection of prosthesis and placement of abx spacer and beads today. Will initiate Vancomycin empirically for septic arthritis, osteomyelitis. Noted CKD. Noted 1500mg  vanc given at 1530 today.  5/21 Vanc>>  Cultures: 5/21 Synovial fluid (L knee)>>  Goal of Therapy:  Vancomycin trough level 15-20 mcg/ml  Plan:  - Vancomycin 1500mg  Iv q 24, start tomorrow at 1500 - Will plan to check Css Vanc trough before dose #4  - If Scr worsens as it had in the March admit, will plan to check random levels to guide Vanc dosing. - Will monitor cx/sens, renal fn and clinical status daily.   Mirna Mires K 08/24/2011,6:49 PM

## 2011-08-24 NOTE — Anesthesia Postprocedure Evaluation (Signed)
  Anesthesia Post-op Note  Patient: [name]  Procedure(s) Performed: Procedure(s) (LRB): EXCISIONAL TOTAL KNEE ARTHROPLASTY WITH ANTIBIOTIC SPACERS (Left)  Patient Location: PACU  Anesthesia Type: GA combined with regional for post-op pain  Level of Consciousness: awake  Airway and Oxygen Therapy: Patient Spontanous Breathing  Post-op Pain: mild  Post-op Assessment: Post-op Vital signs reviewed, Patient's Cardiovascular Status Stable, Respiratory Function Stable, Patent Airway, No signs of Nausea or vomiting and Pain level controlled  Post-op Vital Signs: stable  Complications: No apparent anesthesia complications

## 2011-08-24 NOTE — Op Note (Signed)
PATIENT ID:      [name]  MRN:     [id number] DOB/AGE:    [birth year] / 71 y.o.       OPERATIVE REPORT    DATE OF PROCEDURE:  08/24/2011       PREOPERATIVE DIAGNOSIS:   infection left total knee                                                       Body mass index is 32.59 kg/(m^2).     POSTOPERATIVE DIAGNOSIS:   Infection Left total knee                                                                     Body mass index is 32.59 kg/(m^2).     PROCEDURE:  Procedure(s): EXCISIONAL TOTAL KNEE ARTHROPLASTY WITH ANTIBIOTIC SPACERS     SURGEON:  [name], MD    ASSISTANT:   [name], PA-C   (Present and scrubbed throughout the case, critical for assistance with exposure, retraction, instrumentation, and closure.)          ANESTHESIA: General      DRAINS: Penrose drain in the left knee :      TOURNIQUET TIME:  Total Tourniquet Time Documented: Thigh (Left) - 130 minutes    COMPLICATIONS:  None    CONDITION:  stable  DESCRIPTION:[id number]   [name] 08/24/2011, 5:56 PM

## 2011-08-24 NOTE — Progress Notes (Signed)
Patient ID: [name], male   DOB: March 04, 1941, 71 y.o.   MRN: [id number] There has been no change in health status since  the current H&P.I have examined the patient and discussed the surgery. No contraindications to the planned procedure exist.

## 2011-08-24 NOTE — Progress Notes (Signed)
Original blood band removed, replaced today & redrew specimen

## 2011-08-25 DIAGNOSIS — E1165 Type 2 diabetes mellitus with hyperglycemia: Secondary | ICD-10-CM

## 2011-08-25 DIAGNOSIS — E872 Acidosis, unspecified: Secondary | ICD-10-CM

## 2011-08-25 DIAGNOSIS — M009 Pyogenic arthritis, unspecified: Secondary | ICD-10-CM

## 2011-08-25 DIAGNOSIS — T847XXA Infection and inflammatory reaction due to other internal orthopedic prosthetic devices, implants and grafts, initial encounter: Secondary | ICD-10-CM

## 2011-08-25 DIAGNOSIS — E875 Hyperkalemia: Secondary | ICD-10-CM

## 2011-08-25 DIAGNOSIS — Z96659 Presence of unspecified artificial knee joint: Secondary | ICD-10-CM

## 2011-08-25 DIAGNOSIS — IMO0002 Reserved for concepts with insufficient information to code with codable children: Secondary | ICD-10-CM

## 2011-08-25 DIAGNOSIS — E118 Type 2 diabetes mellitus with unspecified complications: Secondary | ICD-10-CM

## 2011-08-25 LAB — BASIC METABOLIC PANEL
BUN: 23 mg/dL (ref 6–23)
BUN: 24 mg/dL — ABNORMAL HIGH (ref 6–23)
CO2: 18 mEq/L — ABNORMAL LOW (ref 19–32)
Calcium: 9.3 mg/dL (ref 8.4–10.5)
Chloride: 103 mEq/L (ref 96–112)
Creatinine, Ser: 1.64 mg/dL — ABNORMAL HIGH (ref 0.50–1.35)
Creatinine, Ser: 1.75 mg/dL — ABNORMAL HIGH (ref 0.50–1.35)
GFR calc Af Amer: 47 mL/min — ABNORMAL LOW (ref >90)
GFR calc non Af Amer: 37 mL/min — ABNORMAL LOW (ref >90)
Glucose, Bld: 190 mg/dL — ABNORMAL HIGH (ref 70–99)
Potassium: 4.8 mEq/L (ref 3.5–5.1)

## 2011-08-25 LAB — GLUCOSE, CAPILLARY
Glucose-Capillary: 142 mg/dL — ABNORMAL HIGH (ref 70–99)
Glucose-Capillary: 162 mg/dL — ABNORMAL HIGH (ref 70–99)

## 2011-08-25 LAB — CBC
Hemoglobin: 7.9 g/dL — ABNORMAL LOW (ref 13.0–17.0)
MCHC: 32.6 g/dL (ref 30.0–36.0)
Platelets: 208 10*3/uL (ref 150–400)
RBC: 2.8 MIL/uL — ABNORMAL LOW (ref 4.22–5.81)

## 2011-08-25 LAB — HEMOGLOBIN A1C
Hgb A1c MFr Bld: 6.2 % — ABNORMAL HIGH (ref <5.7)
Mean Plasma Glucose: 131 mg/dL — ABNORMAL HIGH (ref <117)

## 2011-08-25 MED ORDER — INSULIN GLARGINE 100 UNIT/ML ~~LOC~~ SOLN
5.0000 [IU] | Freq: Two times a day (BID) | SUBCUTANEOUS | Status: DC
  Administered 2011-08-25 – 2011-08-27 (×5): 5 [IU] via SUBCUTANEOUS

## 2011-08-25 MED ORDER — INSULIN GLARGINE 100 UNIT/ML ~~LOC~~ SOLN: 10.0000 [IU] | Freq: Every day | SUBCUTANEOUS | Status: DC

## 2011-08-25 MED ORDER — ACETAMINOPHEN 10 MG/ML IV SOLN
1000.0000 mg | Freq: Four times a day (QID) | INTRAVENOUS | Status: AC
  Administered 2011-08-25 – 2011-08-26 (×4): 1000 mg via INTRAVENOUS
  Filled 2011-08-25 (×4): qty 100

## 2011-08-25 MED ORDER — ATORVASTATIN CALCIUM 20 MG PO TABS
20.0000 mg | ORAL_TABLET | Freq: Every day | ORAL | Status: DC
Start: 2011-08-25 — End: 2011-08-27
  Administered 2011-08-25 – 2011-08-26 (×2): 20 mg via ORAL
  Filled 2011-08-25 (×3): qty 1

## 2011-08-25 MED ORDER — CITRIC ACID-SODIUM CITRATE 334-500 MG/5ML PO SOLN
30.0000 mL | Freq: Every day | ORAL | Status: DC
  Administered 2011-08-25: 30 mL via ORAL
  Filled 2011-08-25 (×3): qty 30

## 2011-08-25 MED ORDER — SODIUM POLYSTYRENE SULFONATE 15 GM/60ML PO SUSP
15.0000 g | Freq: Once | ORAL | Status: AC
  Administered 2011-08-25: 15 g via ORAL
  Filled 2011-08-25: qty 60

## 2011-08-25 MED ORDER — EZETIMIBE 10 MG PO TABS
10.0000 mg | ORAL_TABLET | Freq: Every day | ORAL | Status: DC
  Administered 2011-08-25 – 2011-08-26 (×2): 10 mg via ORAL
  Filled 2011-08-25 (×3): qty 1

## 2011-08-25 MED ORDER — LIRAGLUTIDE 18 MG/3ML ~~LOC~~ SOLN: 1.2000 mg | Freq: Every morning | SUBCUTANEOUS | Status: DC

## 2011-08-25 NOTE — Evaluation (Signed)
Physical Therapy Evaluation Patient Details Name: [name] MRN: [id number] DOB: Feb 13, 1941 Today's Date: 08/25/2011 Time:  -     PT Assessment / Plan / Recommendation Clinical Impression  pt admitted with L knee infection s/p I and D and spacer placement.  Presently, weak and in pain with decr ability to mobilize.  Expect will make steady progress.  Rec. CIR before D/C home/    PT Assessment  Patient needs continued PT services    Follow Up Recommendations  Inpatient Rehab    Barriers to Discharge        lEquipment Recommendations  Defer to next venue    Recommendations for Other Services     Frequency Min 5X/week    Precautions / Restrictions Precautions Precautions: Fall;Knee Required Braces or Orthoses: Knee Immobilizer - Left Restrictions Weight Bearing Restrictions: Yes LLE Weight Bearing: Partial weight bearing LLE Partial Weight Bearing Percentage or Pounds: 50%   Pertinent Vitals/Pain       Mobility  Bed Mobility Bed Mobility: Supine to Sit;Sitting - Scoot to [place] of Bed;Sit to Supine Supine to Sit: 4: Min assist Sitting - Scoot to [place] of Bed: 5: Supervision Sit to Supine: 4: Min assist Details for Bed Mobility Assistance: vc's for technique incl bridging to EOB and minor LE assist Transfers Transfers: Sit to Stand;Stand to Sit Sit to Stand: 1: +2 Total assist Sit to Stand: Patient Percentage: 60% Stand to Sit: 1: +2 Total assist Stand to Sit: Patient Percentage: 60% Details for Transfer Assistance: vc's for hand placement and safety; assist to com forward over his BOS Ambulation/Gait Ambulation/Gait Assistance: Not tested (comment) Stairs: No Wheelchair Mobility Wheelchair Mobility: No    Exercises     PT Diagnosis: Difficulty walking;Acute pain;Generalized weakness  PT Problem List: Decreased strength;Decreased mobility;Decreased activity tolerance;Pain;Decreased knowledge of use of DME PT Treatment Interventions:     PT Goals Acute  Rehab PT Goals PT Goal Formulation: With patient Time For Goal Achievement: 08/25/11 Potential to Achieve Goals: Good Pt will go Supine/Side to Sit: Independently PT Goal: Supine/Side to Sit - Progress: Goal set today Pt will go Sit to Stand: with supervision PT Goal: Sit to Stand - Progress: Goal set today Pt will Transfer Bed to Chair/Chair to Bed: with supervision PT Transfer Goal: Bed to Chair/Chair to Bed - Progress: Goal set today Pt will Ambulate: 51 - 150 feet;with supervision;with least restrictive assistive device PT Goal: Ambulate - Progress: Goal set today  Visit Information  Last PT Received On: 08/25/11 Assistance Needed: +2    Subjective Data  Subjective: you need to hold the leg, I'm hurting Patient Stated Goal: home independent   Prior Functioning  Home Living Lives With: Spouse Available Help at Discharge: Available 24 hours/day Type of Home: House Home Access: Ramped entrance Home Layout: One level Bathroom Shower/Tub: Tub/shower unit;Walk-in shower;Curtain;Door [employer] Toilet: Standard Home Adaptive Equipment: Walker - rolling;Wheelchair - powered;Wheelchair - manual;Shower chair without back;Tub transfer bench;Grab bars around toilet;Grab bars in shower Prior Function Level of Independence: Independent with assistive device(s) Able to Take Stairs?: No Driving: Yes Vocation: Retired Comments: pt broke ankle 5 years ago (which is why he owns power wheelchair).     Cognition  Behavior During Session: [facility] for tasks performed    Extremity/Trunk Assessment Right Lower Extremity Assessment RLE ROM/Strength/Tone: Within functional levels Left Lower Extremity Assessment LLE ROM/Strength/Tone: Unable to fully assess (immobilized) Trunk Assessment Trunk Assessment: Normal   Balance Balance Balance Assessed: No  End of Session PT - End of  Session Equipment Utilized During Treatment: Left knee immobilizer Activity Tolerance: Patient tolerated treatment  well Patient left: in bed;with call bell/phone within reach Nurse Communication: Mobility status;Weight bearing status   TRUE Garciamartinez, Eliseo Gum 08/25/2011, 4:00 PM  08/25/2011  [place] Bing, PT [phone] (pager)

## 2011-08-25 NOTE — Consult Note (Signed)
Physical Medicine and Rehabilitation Consult Reason for Consult: Infected left total knee replacement Referring Phsyician: Dr. Corrie Mckusick [name] is an 71 y.o. male.   HPI: 71 year old right-handed male with history of left total knee replacement. Patient with complicated history as of March 9 admitted to [facility] with significant pain of the left knee. He had been hospitalized from 3/1 to 06/08/2011 with chest pain and bronchitis placed on steroids and discharge to home. Subsequently after that discharge developed left knee pain. Patient with increasing pain of the left knee which was aspirated in March and revealed  bacteria. He was admitted to the hospital and underwent  debridement of left knee 06/12/2011 and poly-exchange. He was placed on antibiotics. He was noted to have methicillin sensitive staph aureus in his blood as well as left knee. Patient remained on long-term antibiotics per infectious disease Dr. Orvan Falconer. He was readmitted 08/24/2011 due to ongoing knee pain underwent excision of left total knee replacement, components with irrigation and debridement of the knee and insertion of antibiotic spacer per Dr. Cleophas Dunker. Placed on xarelto for DVT prophylaxis. He currently is on vancomycin therapy a waiting drug regimen and duration of antibiotic care. Patient is partial weightbearing with knee immobilizer with ambulation. Physical and occupational therapy evaluations are pending. M.D. has requested physical medicine rehabilitation consult to consider inpatient rehabilitation services  Review of Systems  Respiratory: Positive for cough.   Cardiovascular: Positive for claudication.  Gastrointestinal: Positive for constipation.  Musculoskeletal: Positive for joint pain.  Psychiatric/Behavioral: Positive for depression.  All other systems reviewed and are negative.   Past Medical History  Diagnosis Date  . Gout   . Depression   . Diabetes mellitus   . Unspecified  essential hypertension   . Hypercholesteremia     IIA  . Colitis, ulcerative     NOS  . Personal hx-rectal/anal malignancy   . Urinary incontinence   . Benign prostatic hypertrophy     S/p TURP   . H/O ETOH abuse 06/06/2011  . 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 History of left knee surgery   . Pleurisy   . AAA (abdominal aortic aneurysm) 1992    S/p repair and grafting  . History of tobacco abuse   . History of alcohol abuse   . CAD (coronary artery disease)   . Stroke     71 yrs old  . Bronchitis   . Stones in the urinary tract   . GERD (gastroesophageal reflux disease)   . Arthritis   . Normocytic anemia     Chronic  . Cancer 2006    bladder  . Bladder cancer     Transitional Cell  . PAF (paroxysmal atrial fibrillation)     during hospitalization 06/2011 for sepsis  . Cardiomyopathy, ischemic     EF 40-45% by echo 06/2011   Past Surgical History  Procedure Date  . Colon resection   . Transurethral resection of prostate   . Abdominal aortic aneurysm repair 1992  . I&d knee with poly exchange 06/12/2011    Procedure: IRRIGATION AND DEBRIDEMENT KNEE WITH POLY EXCHANGE;  Surgeon: Thera Flake., MD;  Location: [facility];  Service: Orthopedics;  Laterality: Left;   Family History  Problem Relation Age of Onset  . Dementia Mother   . COPD Father   . Anesthesia problems Neg Hx    Social History:  reports that he quit smoking about 41 years ago. His smoking use  included Cigarettes. He has a 30 pack-year smoking history. His smokeless tobacco use includes Chew. He reports that he does not drink alcohol or use illicit drugs. Allergies:  Allergies  Allergen Reactions  . Fentanyl     Delirium  . Hydromorphone Hcl     Delirium  . Lorazepam     Delirium  . Oxycodone-Acetaminophen     Delirium   Medications Prior to Admission  Medication Sig Dispense Refill  . aspirin 81 MG tablet Take 81 mg by mouth daily.       Marland Kitchen CINNAMON PO  Take 1,000 mg by mouth daily.      Marland Kitchen diltiazem (CARDIZEM CD) 240 MG 24 hr capsule Take 240 mg by mouth daily.      Marland Kitchen HYDROcodone-acetaminophen (NORCO) 5-325 MG per tablet Take 1-2 tablets by mouth every 4 (four) hours as needed. pain      . insulin aspart (NOVOLOG) 100 UNIT/ML injection Inject into the skin 3 (three) times daily before meals. 16units am, 20 units noon, 14 units pm      . insulin glargine (LANTUS) 100 UNIT/ML injection Inject 10 Units into the skin at bedtime.      Marland Kitchen LORazepam (ATIVAN) 0.5 MG tablet Take 0.5 mg by mouth every 8 (eight) hours as needed.      Marland Kitchen losartan (COZAAR) 100 MG tablet Take 100 mg by mouth daily.      . metoprolol tartrate (LOPRESSOR) 25 MG tablet Take 25 mg by mouth 2 (two) times daily.      Marland Kitchen omega-3 acid ethyl esters (LOVAZA) 1 G capsule Take 4 g by mouth daily.      . pantoprazole (PROTONIX) 40 MG tablet Take 40 mg by mouth daily at 12 noon.      Marland Kitchen PARoxetine (PAXIL) 20 MG tablet Take 10-20 mg by mouth 2 (two) times daily. 1 tab in am, 0.5 tab in pm      . traZODone (DESYREL) 25 mg TABS Take 25 mg by mouth at bedtime as needed. sleep      . ezetimibe-simvastatin (VYTORIN) 10-40 MG per tablet Take 1 tablet by mouth at bedtime.      . levalbuterol (XOPENEX) 0.63 MG/3ML nebulizer solution Take 0.63 mg by nebulization every 6 (six) hours as needed. Shortness of breath      . Liraglutide 18 MG/3ML SOLN Inject into the skin. 1.2 mg daily am      . Multiple Vitamin (MULTIVITAMIN) tablet Take 1 tablet by mouth daily.          Home:    Functional History:   Functional Status:  Mobility:          ADL:    Cognition: Cognition Orientation Level: Oriented X4    Blood pressure 118/64, pulse 69, temperature 97.8 F (36.6 C), temperature source Oral, resp. rate 20, height 6\' 1"  (1.854 m), weight 112.038 kg (247 lb), SpO2 98.00%. Physical Exam  Vitals reviewed. Constitutional: He is oriented to person, place, and time.  HENT:  Head: Normocephalic.    Neck: Neck supple. No thyromegaly present.  Cardiovascular:       Cardiac rate control  Pulmonary/Chest:       Decreased breath sounds at the bases  Abdominal: He exhibits no distension. There is no tenderness.  Musculoskeletal: He exhibits no edema.  Neurological: He is alert and oriented to person, place, and time.  Skin:       Left knee incision is dressed  Psychiatric: He has a normal mood and  affect.  motor strength is 5/5 in bilateral deltoid, biceps, triceps, grip, right hip flexor, knee extensors, ankle dorsiflexor plantar flexor as well as left ankle dorsiflexor plantar flexor. 3/5 in the left hip flexor. Left knee extensor not tested due 2 knee immobilizer. Sensation is intact in both feet   Results for orders placed during the hospital encounter of 08/24/11 (from the past 24 hour(s))  GLUCOSE, CAPILLARY     Status: Abnormal   Collection Time   08/24/11 12:16 PM      Component Value Range   Glucose-Capillary 149 (*) 70 - 99 (mg/dL)  URINALYSIS, WITH MICROSCOPIC     Status: Abnormal   Collection Time   08/24/11 12:36 PM      Component Value Range   Color, Urine YELLOW  YELLOW    APPearance CLEAR  CLEAR    Specific Gravity, Urine 1.014  1.005 - 1.030    pH 5.0  5.0 - 8.0    Glucose, UA NEGATIVE  NEGATIVE (mg/dL)   Hgb urine dipstick NEGATIVE  NEGATIVE    Bilirubin Urine NEGATIVE  NEGATIVE    Ketones, ur NEGATIVE  NEGATIVE (mg/dL)   Protein, ur NEGATIVE  NEGATIVE (mg/dL)   Urobilinogen, UA 0.2  0.0 - 1.0 (mg/dL)   Nitrite NEGATIVE  NEGATIVE    Leukocytes, UA TRACE (*) NEGATIVE    WBC, UA 3-6  <3 (WBC/hpf)   RBC / HPF 0-2  <3 (RBC/hpf)   Bacteria, UA FEW (*) RARE    Squamous Epithelial / LPF FEW (*) RARE    Casts HYALINE CASTS (*) NEGATIVE   BASIC METABOLIC PANEL     Status: Abnormal   Collection Time   08/24/11 12:44 PM      Component Value Range   Sodium 132 (*) 135 - 145 (mEq/L)   Potassium 5.6 (*) 3.5 - 5.1 (mEq/L)   Chloride 99  96 - 112 (mEq/L)   CO2 21  19  - 32 (mEq/L)   Glucose, Bld 183 (*) 70 - 99 (mg/dL)   BUN 23  6 - 23 (mg/dL)   Creatinine, Ser 1.61 (*) 0.50 - 1.35 (mg/dL)   Calcium 09.6  8.4 - 10.5 (mg/dL)   GFR calc non Af Amer 39 (*) >90 (mL/min)   GFR calc Af Amer 45 (*) >90 (mL/min)  TYPE AND SCREEN     Status: Normal (Preliminary result)   Collection Time   08/24/11 12:45 PM      Component Value Range   ABO/RH(D) A NEG     Antibody Screen NEG     Sample Expiration 08/27/2011     Unit Number [id number]     Blood Component Type RED CELLS,LR     Unit division 00     Status of Unit ISSUED     Transfusion Status OK TO TRANSFUSE     Crossmatch Result Compatible    GRAM STAIN     Status: Normal   Collection Time   08/24/11  3:33 PM      Component Value Range   Specimen Description SYNOVIAL FLUID KNEE LEFT     Special Requests SWAB RECIEVED NO 1     Gram Stain       Value: ABUNDANT WBC PRESENT,BOTH PMN AND MONONUCLEAR     NO ORGANISMS SEEN   Report Status 08/24/2011 FINAL    GRAM STAIN     Status: Normal   Collection Time   08/24/11  3:35 PM      Component Value Range  Specimen Description TISSUE KNEE LEFT     Special Requests NO 2     Gram Stain       Value: ABUNDANT WBC PRESENT,BOTH PMN AND MONONUCLEAR     NO ORGANISMS SEEN   Report Status 08/24/2011 FINAL    GLUCOSE, CAPILLARY     Status: Abnormal   Collection Time   08/24/11  6:10 PM      Component Value Range   Glucose-Capillary 157 (*) 70 - 99 (mg/dL)  PREPARE RBC (CROSSMATCH)     Status: Normal   Collection Time   08/24/11  6:24 PM      Component Value Range   Order Confirmation ORDER PROCESSED BY BLOOD BANK    GLUCOSE, CAPILLARY     Status: Abnormal   Collection Time   08/24/11  7:04 PM      Component Value Range   Glucose-Capillary 203 (*) 70 - 99 (mg/dL)   Comment 1 Notify RN    GLUCOSE, CAPILLARY     Status: Abnormal   Collection Time   08/24/11  8:06 PM      Component Value Range   Glucose-Capillary 181 (*) 70 - 99 (mg/dL)   Comment 1 Notify RN      BASIC METABOLIC PANEL     Status: Abnormal   Collection Time   08/25/11 12:07 AM      Component Value Range   Sodium 132 (*) 135 - 145 (mEq/L)   Potassium 5.8 (*) 3.5 - 5.1 (mEq/L)   Chloride 103  96 - 112 (mEq/L)   CO2 18 (*) 19 - 32 (mEq/L)   Glucose, Bld 176 (*) 70 - 99 (mg/dL)   BUN 23  6 - 23 (mg/dL)   Creatinine, Ser 4.09 (*) 0.50 - 1.35 (mg/dL)   Calcium 9.3  8.4 - 81.1 (mg/dL)   GFR calc non Af Amer 40 (*) >90 (mL/min)   GFR calc Af Amer 47 (*) >90 (mL/min)  GLUCOSE, CAPILLARY     Status: Abnormal   Collection Time   08/25/11  5:41 AM      Component Value Range   Glucose-Capillary 187 (*) 70 - 99 (mg/dL)   No results found.  Assessment/Plan: Diagnosis: left knee septic arthritis status post antibiotic spacer placementpostoperative day #1. 1. Does the need for close, 24 hr/day medical supervision in concert with the patient's rehab needs make it unreasonable for this patient to be served in a less intensive setting? potentially 2. Co-Morbidities requiring supervision/potential complications: chronic kidney disease, diabetes, chronic infection left knee requiring IV antibiotics, morbid obesity 3. Due to bladder management, bowel management, safety, skin/wound care, disease management, medication administration and pain management, does the patient require 24 hr/day rehab nursing? Potentially 4. Does the patient require coordinated care of a physician, rehab nurse, PT (0.5-1 hrs/day, 3 days/week) and OT (0.5-1 hrs/day, 3 days/week) to address physical and functional deficits in the context of the above medical diagnosis(es)? No Addressing deficits in the following areas: balance, endurance, locomotion, strength, transferring, bowel/bladder control, bathing, dressing and toileting 5. Can the patient actively participate in an intensive therapy program of at least 3 hrs of therapy per day at least 5 days per week? Yes 6. The potential for patient to make measurable gains while on  inpatient rehab is excellent 7. Anticipated functional outcomes upon discharge from inpatient rehab are not applicable with PT, not applicable with OT, not applicable with SLP. 8. Estimated rehab length of stay to reach the above functional goals is: not applicable  9. Does the patient have adequate social supports to accommodate these discharge functional goals? Yes 10. Anticipated D/C setting: Home 11. Anticipated post D/C treatments: HH therapy 12. Overall Rehab/Functional Prognosis: excellent  RECOMMENDATIONS: This patient's condition is appropriate for continued rehabilitative care in the following setting: [facility] Patient has agreed to participate in recommended program. Yes Note that insurance prior authorization may be required for reimbursement for recommended care.  Comment:I anticipate that the patient will reach a modified independent level after acute care PT OT. If patient has a setback or fails to progress as expected please reconsult   ANGIULLI,DANIEL J. 08/25/2011

## 2011-08-25 NOTE — Consult Note (Signed)
[facility] Medical Consultation  [name] AVW:[id number] DOB: 10-22-1940 DOA: 08/24/2011 PCP: Ailene Ravel, MD, MD   Requesting physician: [name]   Date of consultation: 08/25/11  Reason for consultation: management of diabetes   HPI:  71 yo man admitted on 08/24/11 for elective excision of left TKR and insertion of abx spacer. Patient has multiple medical medical problems and the hospitalist was consulted for postop management.   Review of Systems:  Mild pain in the knee. Denies chest pain or dyspnea. No nausea or vomiting All other systems reviewed or per hpi   Past Medical History  Diagnosis Date  . Gout   . Depression   . Diabetes mellitus   . Unspecified essential hypertension   . Hypercholesteremia     IIA  . Colitis, ulcerative     NOS  . Personal hx-rectal/anal malignancy   . Urinary incontinence   . Benign prostatic hypertrophy     S/p TURP   . H/O ETOH abuse 06/06/2011  . 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 History of left knee surgery   . Pleurisy   . AAA (abdominal aortic aneurysm) 1992    S/p repair and grafting  . History of tobacco abuse   . History of alcohol abuse   . CAD (coronary artery disease)   . Stroke     71 yrs old  . Bronchitis   . Stones in the urinary tract   . GERD (gastroesophageal reflux disease)   . Arthritis   . Normocytic anemia     Chronic  . Cancer 2006    bladder  . Bladder cancer     Transitional Cell  . PAF (paroxysmal atrial fibrillation)     during hospitalization 06/2011 for sepsis  . Cardiomyopathy, ischemic     EF 40-45% by echo 06/2011   Past Surgical History  Procedure Date  . Colon resection   . Transurethral resection of prostate   . Abdominal aortic aneurysm repair 1992  . I&d knee with poly exchange 06/12/2011    Procedure: IRRIGATION AND DEBRIDEMENT KNEE WITH POLY EXCHANGE;  Surgeon: Thera Flake., MD;  Location: [facility];  Service:  Orthopedics;  Laterality: Left;   Social History:  reports that he quit smoking about 41 years ago. His smoking use included Cigarettes. He has a 30 pack-year smoking history. His smokeless tobacco use includes Chew. He reports that he does not drink alcohol or use illicit drugs.  Allergies  Allergen Reactions  . Fentanyl     Delirium  . Hydromorphone Hcl     Delirium  . Lorazepam     Delirium  . Oxycodone-Acetaminophen     Delirium   Family History  Problem Relation Age of Onset  . Dementia Mother   . COPD Father   . Anesthesia problems Neg Hx     Prior to Admission medications   Medication Sig Start Date End Date Taking? Authorizing Provider  aspirin 81 MG tablet Take 81 mg by mouth daily.    Yes Historical Provider, MD  CINNAMON PO Take 1,000 mg by mouth daily.   Yes Historical Provider, MD  diltiazem (CARDIZEM CD) 240 MG 24 hr capsule Take 240 mg by mouth daily. 06/24/11 06/23/12 Yes Lesle Chris Black, NP  HYDROcodone-acetaminophen (NORCO) 5-325 MG per tablet Take 1-2 tablets by mouth every 4 (four) hours as needed. pain   Yes Historical Provider, MD  insulin aspart (NOVOLOG) 100 UNIT/ML injection Inject into  the skin 3 (three) times daily before meals. 16units am, 20 units noon, 14 units pm   Yes Historical Provider, MD  insulin glargine (LANTUS) 100 UNIT/ML injection Inject 10 Units into the skin at bedtime. 06/24/11 06/23/12 Yes Lesle Chris Black, NP  LORazepam (ATIVAN) 0.5 MG tablet Take 0.5 mg by mouth every 8 (eight) hours as needed.   Yes Historical Provider, MD  losartan (COZAAR) 100 MG tablet Take 100 mg by mouth daily.   Yes Historical Provider, MD  metoprolol tartrate (LOPRESSOR) 25 MG tablet Take 25 mg by mouth 2 (two) times daily. 06/24/11 06/23/12 Yes Lesle Chris Black, NP  omega-3 acid ethyl esters (LOVAZA) 1 G capsule Take 4 g by mouth daily.   Yes Historical Provider, MD  pantoprazole (PROTONIX) 40 MG tablet Take 40 mg by mouth daily at 12 noon. 06/24/11 06/23/12 Yes Lesle Chris Black,  NP  PARoxetine (PAXIL) 20 MG tablet Take 10-20 mg by mouth 2 (two) times daily. 1 tab in am, 0.5 tab in pm   Yes Historical Provider, MD  traZODone (DESYREL) 25 mg TABS Take 25 mg by mouth at bedtime as needed. sleep 06/24/11  Yes Lesle Chris Black, NP  ezetimibe-simvastatin (VYTORIN) 10-40 MG per tablet Take 1 tablet by mouth at bedtime.    Historical Provider, MD  levalbuterol Pauline Aus) 0.63 MG/3ML nebulizer solution Take 0.63 mg by nebulization every 6 (six) hours as needed. Shortness of breath 06/24/11 06/23/12  Gwenyth Bender, NP  Liraglutide 18 MG/3ML SOLN Inject into the skin. 1.2 mg daily am    Historical Provider, MD  Multiple Vitamin (MULTIVITAMIN) tablet Take 1 tablet by mouth daily.      Historical Provider, MD   Physical Exam: Blood pressure 118/64, pulse 69, temperature 97.8 F (36.6 C), temperature source Oral, resp. rate 20, height 6\' 1"  (1.854 m), weight 112.038 kg (247 lb), SpO2 98.00%. Filed Vitals:   08/24/11 2015 08/24/11 2040 08/24/11 2123 08/25/11 0601  BP: 118/66  136/80 118/64  Pulse:   62 69  Temp:  97.5 F (36.4 C) 97.5 F (36.4 C) 97.8 F (36.6 C)  TempSrc:      Resp:   20 20  Height:      Weight:      SpO2:  100% 100% 98%     General:  Alert an doriented x3. NAD  Eyes: PERRLA, EOMI  ENT: clear  Neck: no jvd  Cardiovascular: RRR, no M,R,G  Respiratory: CTAB  Abdomen: soft,NT  Skin: pale, dry  Musculoskeletal: s/p surgery left knee  Psychiatric: euthymic  Neurologic: grossly intact  Labs on Admission:  Basic Metabolic Panel:  Lab 08/25/11 1610 08/24/11 1244  NA 132* 132*  K 5.8* 5.6*  CL 103 99  CO2 18* 21  GLUCOSE 176* 183*  BUN 23 23  CREATININE 1.64* 1.69*  CALCIUM 9.3 10.1  MG -- --  PHOS -- --   Liver Function Tests: No results found for this basename: AST:5,ALT:5,ALKPHOS:5,BILITOT:5,PROT:5,ALBUMIN:5 in the last 168 hours No results found for this basename: LIPASE:5,AMYLASE:5 in the last 168 hours No results found for this  basename: AMMONIA:5 in the last 168 hours CBC:  Lab 08/25/11 0550  WBC 6.4  NEUTROABS --  HGB 7.9*  HCT 24.2*  MCV 86.4  PLT 208   Cardiac Enzymes: No results found for this basename: CKTOTAL:5,CKMB:5,CKMBINDEX:5,TROPONINI:5 in the last 168 hours BNP: No components found with this basename: POCBNP:5 CBG:  Lab 08/25/11 0541 08/24/11 2006 08/24/11 1904 08/24/11 1810 08/24/11 1216  GLUCAP 187*  181* 203* 157* 149*    Radiological Exams on Admission: No results found.        Marland Kitchen acetaminophen  1,000 mg Intravenous Once  . dextrose  12.5 mL Intravenous Once  . diltiazem  240 mg Oral Daily  . docusate sodium  100 mg Oral BID  . ezetimibe-simvastatin  1 tablet Oral QHS  . insulin aspart  0-15 Units Subcutaneous TID WC  . insulin aspart  0-5 Units Subcutaneous QHS  . insulin glargine  10 Units Subcutaneous QHS  . insulin regular  10 Units Intravenous Once  . ketorolac  15 mg Intravenous Q6H  . Liraglutide  1.2 mg Subcutaneous q morning - 10a  . metoprolol tartrate  25 mg Oral BID  . morphine      . omega-3 acid ethyl esters  4 g Oral Daily  . pantoprazole  40 mg Oral Q1200  . PARoxetine  10 mg Oral QHS  . PARoxetine  20 mg Oral Daily  . rivaroxaban  10 mg Oral Daily  . vancomycin  1,500 mg Intravenous Q24H  . DISCONTD: acetaminophen  1,000 mg Intravenous Q6H  . DISCONTD: losartan  100 mg Oral Daily  . DISCONTD: tobramycin  320 mg Intramuscular To OR  . DISCONTD: tobramycin  2.4 g Topical To OR  . DISCONTD: vancomycin (VANCOCIN) 1500 mg IVPB  1,500 mg Intravenous To OR  . DISCONTD: vancomycin  2,000 mg Other To OR  . DISCONTD: vancomycin  4,000 mg Other To OR  . DISCONTD: vancomycin  500 mg Other To OR   Active Problems:  DM (diabetes mellitus), type 2 with complications  HYPERCHOLESTEROLEMIA  IIA  ANEMIA, NORMOCYTIC, CHRONIC  DEPRESSION  HYPERTENSION, UNSPECIFIED  CAD, UNSPECIFIED SITE  Hyperkalemia  Infection of prosthetic left knee joint  CKD (chronic kidney  disease) stage 3, GFR 30-59 ml/min  Morbid obesity  Hyponatremia  Staphylococcus aureus bacteremia   Impression/Recommendations 1. DM 2 -- resume lantus but dose BID due to renal failure and unpredictable po intake. Patient has stopped liraglutide 2 months ago. C/w ssi novolog. F/u cbgs 2. Hyperkalemia and acidosis - ? RTA type IV . DC ARB. Start oracit. Kayexalate one dose. F/u bmet. UO OK. Avoid nsaids 3. Infected TKR per ortho and ID 4. Depression on paxil 5. CAD , hx of CVA - to resume aspirin as soon as safe from ortho point of view  I will followup again tomorrow. Please contact me if I can be of assistance in the meanwhile. Thank you for this consultation.  Lonia Blood, MD  [facility] Pager [phone]  If 7PM-7AM, please contact night-coverage [email or web address] Password [facility] 08/25/2011, 7:05 AM

## 2011-08-25 NOTE — Progress Notes (Signed)
Patient ID: [name], male   DOB: Oct 21, 1940, 71 y.o.   MRN: [id number] PATIENT ID: [name]        MRN:  [id number]          DOB/AGE: [birth year] / 71 y.o.  Logan Campbell, MD   Logan Code, PA-C [address] [place], [place], [place]  [address]                             [phone]   PROGRESS NOTE  Subjective:  negative for Chest Pain  negative for Shortness of Breath  negative for Nausea/Vomiting   negative for Calf Pain  negative for Bowel Movement   Tolerating Diet: yes         Patient reports pain as mild.    Objective: Vital signs in last 24 hours:   Patient Vitals for the past 24 hrs:  BP Temp Temp src Pulse Resp SpO2 Height Weight  08/25/11 0601 118/64 mmHg 97.8 F (36.6 C) - 69  20  98 % - -  08/24/11 2123 136/80 mmHg 97.5 F (36.4 C) - 62  20  100 % - -  08/24/11 2040 - 97.5 F (36.4 C) - - - 100 % - -  08/24/11 2015 118/66 mmHg - - - - - - -  08/24/11 2000 125/49 mmHg - - - - - - -  08/24/11 1930 101/56 mmHg - - - - - - -  08/24/11 1915 115/55 mmHg - - - - - - -  08/24/11 1904 114/59 mmHg - - - - - - -  08/24/11 1900 105/55 mmHg - - - - - - -  08/24/11 1845 105/45 mmHg - - - - - - -  08/24/11 1830 114/47 mmHg - - - - - - -  08/24/11 1815 99/54 mmHg - - - - - - -  08/24/11 1511 - - - - - - 6\' 1"  (1.854 m) 112.038 kg (247 lb)  08/24/11 1400 - - - 60  14  100 % - -  08/24/11 1350 - - - 70  18  97 % - -  08/24/11 1210 102/54 mmHg 98.3 F (36.8 C) Oral 72  18  98 % - -      Intake/Output from previous day:   05/21 0701 - 05/22 0700 In: 2100 [I.V.:2100] Out: 1030 [Urine:580; Drains:450]   Intake/Output this shift:   05/22 0701 - 05/22 1900 In: 240 [P.O.:240] Out: 50 [Drains:50]   Intake/Output      05/21 0701 - 05/22 0700 05/22 0701 - 05/23 0700   P.O.  240   I.V. (mL/kg) 2100 (18.7)    Total Intake(mL/kg) 2100 (18.7) 240 (2.1)   Urine (mL/kg/hr) 580 (0.2)    Drains 450 50   Total Output 1030 50   Net +1070 +190            LABORATORY DATA:  Basename 08/25/11 0550  WBC 6.4  HGB 7.9*  HCT 24.2*  PLT 208    Basename 08/25/11 0550 08/25/11 0007 08/24/11 1244  NA 133* 132* 132*  K 4.8 5.8* 5.6*  CL 103 103 99  CO2 17* 18* 21  BUN 24* 23 23  CREATININE 1.75* 1.64* 1.69*  GLUCOSE 190* 176* 183*  CALCIUM 9.3 9.3 10.1   Lab Results  Component Value Date   INR 1.10 08/10/2011   INR 1.19 06/12/2011   INR 1.99* 01/28/2009  Examination:  General appearance: alert and no distress  Wound Exam: clean, dry, intact   Drainage:  None: wound tissue dry  Motor Exam: EHL, FHL, Anterior Tibial and Posterior Tibial Intact  Sensory Exam: Superficial Peroneal, Deep Peroneal and Tibial normal  Vascular Exam: Normal  Assessment:    1 Day Post-Op  Procedure(s) (LRB): EXCISIONAL TOTAL KNEE ARTHROPLASTY WITH ANTIBIOTIC SPACERS (Left)  ADDITIONAL DIAGNOSIS:  Active Problems:  DM (diabetes mellitus), type 2 with complications  HYPERCHOLESTEROLEMIA  IIA  ANEMIA, NORMOCYTIC, CHRONIC  DEPRESSION  HYPERTENSION, UNSPECIFIED  CAD, UNSPECIFIED SITE  Hyperkalemia  Infection of prosthetic left knee joint  CKD (chronic kidney disease) stage 3, GFR 30-59 ml/min  Morbid obesity  Hyponatremia  Staphylococcus aureus bacteremia  Acute Blood Loss Anemia   Plan: Physical Therapy as ordered Partial Weight Bearing @ 50% (PWB)  DVT Prophylaxis:  Xarelto  DISCHARGE PLAN: Inpatient Rehab if accepted  DISCHARGE NEEDS: IV Antibiotics   good night with minimal pain, hemovac with 50cc-will D/C, OOB with PT, IP rehab consult, will transfuse 1 unit packed cells    Altamese Deguire W 08/25/2011, 9:11 AM

## 2011-08-25 NOTE — Progress Notes (Signed)
Nutrition Brief Note:  Pt determine to be at nutrition risk; positive screen for wt loss.  Pt reports he believes wt loss was related to fluid status, particularly swelling of his knee.  Pt's usual weight appears to be 249-250 lbs and he currently weighs 247 lbs.  Pt denies concerns for wt status or other nutrition-related need at this time.  He states that his appetite was decreased compared to his usual PTA, but it is returning and he is eating better.  Pt consumed 50% of breakfast this am.  No nutrition dx or warranted interventions at this time.  RD will continue to follow and monitor for changes in status and nutritional adequacy.  Please consult if needed.  Hoyt Koch Pager: [phone]

## 2011-08-25 NOTE — Op Note (Signed)
[name], [name] NO.:  [id number]  MEDICAL RECORD NO.:  [id number]  LOCATION:  5015                         FACILITY:  [facility]  PHYSICIAN:  [name], M.D.DATE OF BIRTH:  [birth year]  DATE OF PROCEDURE:  08/24/2011 DATE OF DISCHARGE:                              OPERATIVE REPORT   PREOPERATIVE DIAGNOSIS:  Infected left total knee replacement.  POSTOPERATIVE DIAGNOSIS:  Infected left total knee replacement.  PROCEDURE:  Excision of left total knee replacement, components with I and D of the knee and insertion of antibiotic spacer.  SURGEON:  Logan Manges. Cleophas Dunker, MD  ASSISTANT:  Logan Drone. Petrarca, PA-C  ANESTHESIA:  Femoral nerve block with supplemental general.  COMPLICATIONS:  None.  PROCEDURE:  [name] was met in the holding area, identified the left knee as the appropriate operative site.  He did receive a preoperative femoral nerve block from Anesthesia.  [name] was then transported to room #1 and placed under general anesthesia without difficulty.  Nursing staff inserted a Foley catheter.  Urine was clear.  Left lower extremity was placed in a thigh tourniquet.  The leg was then prepped with Betadine scrub and DuraPrep from the tourniquet to the midfoot.  Sterile draping was performed.  A time-out was called.  With the extremity elevated, was Esmarch exsanguinated with a proximal tourniquet at 250 mmHg.  The prior incision was elliptically excised.  The first layer of capsule was incised in the midline with the Bovie and a medial parapatellar incision was then made through the deep capsule.  We immediately encountered a serosanguineous purulent-looking fluid.  Specimens were sent for anaerobic and anaerobic culture.  We then able to evert the patella 180 degrees.  There was abundant inflammatory synovitis.  Specimens were sent for culture.  The patient had a prior MSSA positive culture.  A radical synovectomy was then  performed.  The components appeared to be stable.  Accordingly, we removed them using a small oscillating saw blade and the small flexible osteotome.  We were able to remove them with little of any bone loss on both the femoral and the tibial side. Remaining methacrylate was then removed from the tibial side.  We also removed the patella using the oscillating saw and the osteotome. So, again with little of any bone loss.  The wound was then copiously irrigated with saline solution.  We found some further synovitis posteriorly, which we debrided.  We also irrigated all the bony holes.  We then proceeded to insert an antibiotic spacer.  We initially applied the femur with a mold and then did the same on the tibial side.  We used methacrylate with tobramycin and vancomycin, also used Stimulan pellets with the same antibiotic.  We placed them into the femoral and tibial holes and in the superior pouch at the end of the procedure.  We used a 17.5-mm polyethylene bridging bearing and we reduced the entire construct.  We realized that the femoral component was then varus and simply would not match to the tibia.  Accordingly, we removed the femoral component and then reinserted a new batch of methacrylate with tobramycin and vancomycin in a more valgus position.  The  knee appeared to be perfectly stable at that point.  The tourniquet was deflated at 2 hours and 7 minutes with immediate capillary refill to the joint.  The wound was irrigated again with saline solution.  Hemovac was inserted.  The deep capsule was closed with #1 PDS.  Superficial capsule was closed with combination of 2-0 PDS and 3-0 Monocryl.  Skin was closed with skin clips.  Sterile bulky dressing was applied followed by an Ace bandage and knee immobilizer.  The patient tolerated the procedure without complications.     [name], M.D.     PWW/MEDQ  D:  08/24/2011  T:  08/25/2011  Job:  [id number]

## 2011-08-25 NOTE — Progress Notes (Signed)
PHARMACIST - PHYSICIAN COMMUNICATION DR:  Cleophas Dunker CONCERNING:  Vytorin 10-40 (contains Simvastatin 40mg ) and Diltiazem   RECOMMENDATION: The dose of simvastatin should not exceed 10 mg/day in patients receiving Diltiazem due to risk of myopathy, including rhabdomyolysis. Per FDA warning and recommendations, the simvastatin should be switched to an alternative statin with less potential for the drug interaction (Atorvastatin).   DESCRIPTION:  Clinically equivalent dose of Lipitor (atorvastatin) has been substituted for Zocor (Simvastatin) per [facility] P&T committee policy.  Noah Delaine, RPh Clinical Pharmacist 08/25/2011 11:27 AM

## 2011-08-25 NOTE — Evaluation (Signed)
Occupational Therapy Evaluation Patient Details Name: [name] MRN: [id number] DOB: 18-Jan-1941 Today's Date: 08/25/2011 Time: 7829-5621 OT Time Calculation (min): 55 min  OT Assessment / Plan / Recommendation Clinical Impression  Pt s/p knee hardware removal and antibiotic spacer placement secondary to infection. Pt with recent admission into hospital for bronchitis as well. Pt presents with generalized weakness, pain, and overall decreased independence with ADLs. Will benefit from skilled OT in the acute setting to maximize I with ADL and ADL mobility prior to d/c. Believe pt would benefit from inpatient rehab stay to allow for safe discharge home with wife    OT Assessment  Patient needs continued OT Services    Follow Up Recommendations  Inpatient Rehab    Barriers to Discharge      Equipment Recommendations  Defer to next venue    Recommendations for Other Services Rehab consult  Frequency  Min 2X/week    Precautions / Restrictions Precautions Precautions: Fall;Knee Required Braces or Orthoses: Knee Immobilizer - Left Restrictions Weight Bearing Restrictions: Yes LLE Weight Bearing: Partial weight bearing LLE Partial Weight Bearing Percentage or Pounds: 50%   Pertinent Vitals/Pain Reports pain/discomfort in LLE but did not rate. Repositioned    ADL  Eating/Feeding: Simulated;Independent Where Assessed - Eating/Feeding: Edge of bed Grooming: Performed;Set up;Supervision/safety;Wash/dry face Where Assessed - Grooming: Unsupported sitting Lower Body Bathing: Performed;Moderate assistance Where Assessed - Lower Body Bathing: Supine, head of bed flat;Rolling right and/or left Upper Body Dressing: Performed;Set up;Supervision/safety (gown) Where Assessed - Upper Body Dressing: Unsupported sitting Lower Body Dressing: Performed;+1 Total assistance Where Assessed - Lower Body Dressing: Supported standing Toilet Transfer: Simulated;+2 Total assistance Toilet Transfer:  Patient Percentage: 60% Toilet Transfer Method: Sit to stand (to/from bed) Transfers/Ambulation Related to ADLs: NT  ADL Comments: Pt with "exploded" colostomy bag requiring extensive clean-up and bathing. RN came into room to assist with applying new bag.     OT Diagnosis: Generalized weakness;Acute pain  OT Problem List: Decreased strength;Decreased activity tolerance;Decreased range of motion;Impaired balance (sitting and/or standing);Decreased knowledge of use of DME or AE;Decreased knowledge of precautions;Pain OT Treatment Interventions: Self-care/ADL training;DME and/or AE instruction;Therapeutic activities;Patient/family education;Balance training   OT Goals Acute Rehab OT Goals OT Goal Formulation: With patient Time For Goal Achievement: 09/08/11 Potential to Achieve Goals: Good ADL Goals Pt Will Perform Grooming: with supervision;Standing at sink ADL Goal: Grooming - Progress: Goal set today Pt Will Perform Lower Body Bathing: with supervision;with set-up;Sit to stand from chair;Sit to stand from bed ADL Goal: Lower Body Bathing - Progress: Goal set today Pt Will Perform Lower Body Dressing: with mod assist;Sit to stand from chair;Sit to stand from bed ADL Goal: Lower Body Dressing - Progress: Goal set today Pt Will Transfer to Toilet: with supervision;Ambulation;with DME ADL Goal: Toilet Transfer - Progress: Goal set today  Visit Information  Last OT Received On: 08/25/11 Assistance Needed: +2 PT/OT Co-Evaluation/Treatment: Yes    Subjective Data  Subjective: I'd like to be able to get back home and do for myself Patient Stated Goal: Return home   Prior Functioning  Home Living Lives With: Spouse Available Help at Discharge: Available 24 hours/day Type of Home: House Home Access: Ramped entrance Home Layout: One level Bathroom Shower/Tub: Tub/shower unit;Walk-in shower;Curtain;Door [employer] Toilet: Standard Home Adaptive Equipment: Walker - rolling;Wheelchair -  powered;Wheelchair - manual;Shower chair without back;Tub transfer bench;Grab bars around toilet;Grab bars in shower Prior Function Level of Independence: Independent with assistive device(s) Able to Take Stairs?: No Driving: Yes Vocation: Retired Comments: pt broke  ankle 5 years ago (which is why he owns power wheelchair).  Communication Communication: No difficulties Dominant Hand: Right    Cognition  Overall Cognitive Status: Appears within functional limits for tasks assessed/performed Behavior During Session: [facility] for tasks performed    Extremity/Trunk Assessment Right Upper Extremity Assessment RUE ROM/Strength/Tone: WFL for tasks assessed RUE Sensation: WFL - Light Touch RUE Coordination: WFL - gross/fine motor Left Upper Extremity Assessment LUE ROM/Strength/Tone: WFL for tasks assessed LUE Sensation: WFL - Light Touch LUE Coordination: WFL - gross/fine motor Right Lower Extremity Assessment RLE ROM/Strength/Tone: Within functional levels Left Lower Extremity Assessment LLE ROM/Strength/Tone: Unable to fully assess (immobilized) Trunk Assessment Trunk Assessment: Normal   Mobility Bed Mobility Bed Mobility: Supine to Sit;Sitting - Scoot to [place] of Bed;Sit to Supine Supine to Sit: 4: Min assist Sitting - Scoot to [place] of Bed: 5: Supervision Sit to Supine: 4: Min assist Details for Bed Mobility Assistance: vc's for technique incl bridging to EOB and minor LE assist Transfers Sit to Stand: 1: +2 Total assist Sit to Stand: Patient Percentage: 60% Stand to Sit: 1: +2 Total assist Stand to Sit: Patient Percentage: 60% Details for Transfer Assistance: vc's for hand placement and safety; assist to com forward over his BOS   Exercise    Balance Balance Balance Assessed: No  End of Session OT - End of Session Equipment Utilized During Treatment: Gait belt;Left knee immobilizer Activity Tolerance: Patient limited by pain Patient left: in bed;with call bell/phone within  reach;with nursing in room Nurse Communication: Mobility status   Patrice Moates 08/25/2011, 4:15 PM

## 2011-08-25 NOTE — Progress Notes (Signed)
INFECTIOUS DISEASE PROGRESS NOTE  ID: [name] is a 71 y.o. male with   Active Problems:  DM (diabetes mellitus), type 2 with complications  HYPERCHOLESTEROLEMIA  IIA  ANEMIA, NORMOCYTIC, CHRONIC  DEPRESSION  HYPERTENSION, UNSPECIFIED  CAD, UNSPECIFIED SITE  Hyperkalemia  Infection of prosthetic left knee joint  CKD (chronic kidney disease) stage 3, GFR 30-59 ml/min  Morbid obesity  Hyponatremia  Staphylococcus aureus bacteremia  Subjective: C/o pain in his knee, ankle.   Abtx:  Anti-infectives     Start     Dose/Rate Route Frequency Ordered Stop   08/25/11 1400   vancomycin (VANCOCIN) 1,500 mg in sodium chloride 0.9 % 500 mL IVPB        1,500 mg 250 mL/hr over 120 Minutes Intravenous Every 24 hours 08/24/11 1905     08/24/11 1700   vancomycin (VANCOCIN) powder 2,000 mg  Status:  Discontinued        2,000 mg Other To Surgery 08/24/11 1650 08/24/11 2107   08/24/11 1700   tobramycin (NEBCIN) powder 2.4 g  Status:  Discontinued        2.4 g Topical To Surgery 08/24/11 1651 08/24/11 2319   08/24/11 1530   vancomycin (VANCOCIN) powder 4,000 mg  Status:  Discontinued        4,000 mg Other To Surgery 08/24/11 1523 08/24/11 2319   08/24/11 1520   tobramycin (NEBCIN) powder  Status:  Discontinued          As needed 08/24/11 1520 08/24/11 1808   08/24/11 1515   vancomycin (VANCOCIN) 1,500 mg in sodium chloride 0.9 % 500 mL IVPB  Status:  Discontinued        1,500 mg 250 mL/hr over 120 Minutes Intravenous To Surgery 08/24/11 1516 08/24/11 1904   08/24/11 1504   vancomycin (VANCOCIN) powder  Status:  Discontinued          As needed 08/24/11 1505 08/24/11 1808   08/24/11 1503   tobramycin (NEBCIN) injection  Status:  Discontinued          As needed 08/24/11 1504 08/24/11 1808   08/24/11 1430   vancomycin (VANCOCIN) powder 500 mg  Status:  Discontinued        500 mg Other To Surgery 08/24/11 1431 08/24/11 2319   08/24/11 1430   tobramycin (NEBCIN) injection 320 mg   Status:  Discontinued        320 mg Intramuscular To Surgery 08/24/11 1431 08/24/11 2319          Medications:  Scheduled:   . acetaminophen  1,000 mg Intravenous Q6H  . atorvastatin  20 mg Oral QHS  . citric acid-sodium citrate  30 mL Oral QPC breakfast  . dextrose  12.5 mL Intravenous Once  . diltiazem  240 mg Oral Daily  . docusate sodium  100 mg Oral BID  . ezetimibe  10 mg Oral QHS  . insulin aspart  0-15 Units Subcutaneous TID WC  . insulin aspart  0-5 Units Subcutaneous QHS  . insulin glargine  5 Units Subcutaneous BID  . insulin regular  10 Units Intravenous Once  . ketorolac  15 mg Intravenous Q6H  . metoprolol tartrate  25 mg Oral BID  . morphine      . omega-3 acid ethyl esters  4 g Oral Daily  . pantoprazole  40 mg Oral Q1200  . PARoxetine  10 mg Oral QHS  . PARoxetine  20 mg Oral Daily  . rivaroxaban  10 mg Oral Daily  .  sodium polystyrene  15 g Oral Once  . vancomycin  1,500 mg Intravenous Q24H  . DISCONTD: acetaminophen  1,000 mg Intravenous Q6H  . DISCONTD: ezetimibe-simvastatin  1 tablet Oral QHS  . DISCONTD: insulin glargine  10 Units Subcutaneous QHS  . DISCONTD: Liraglutide  1.2 mg Subcutaneous q morning - 10a  . DISCONTD: losartan  100 mg Oral Daily  . DISCONTD: tobramycin  320 mg Intramuscular To OR  . DISCONTD: tobramycin  2.4 g Topical To OR  . DISCONTD: vancomycin (VANCOCIN) 1500 mg IVPB  1,500 mg Intravenous To OR  . DISCONTD: vancomycin  2,000 mg Other To OR  . DISCONTD: vancomycin  4,000 mg Other To OR  . DISCONTD: vancomycin  500 mg Other To OR    Objective: Vital signs in last 24 hours: Temp:  [97.5 F (36.4 C)-98.9 F (37.2 C)] 98.2 F (36.8 C) (05/22 1635) Pulse Rate:  [62-80] 80  (05/22 1635) Resp:  [16-20] 16  (05/22 1635) BP: (99-149)/(41-80) 126/57 mmHg (05/22 1635) SpO2:  [98 %-100 %] 98 % (05/22 1336)   General appearance: alert, cooperative and no distress Extremities: L knee wrapped, light touch in toes.   Lab  Results  Basename 08/25/11 0550 08/25/11 0007  WBC 6.4 --  HGB 7.9* --  HCT 24.2* --  NA 133* 132*  K 4.8 5.8*  CL 103 103  CO2 17* 18*  BUN 24* 23  CREATININE 1.75* 1.64*  GLU -- --   Liver Panel No results found for this basename: PROT:2,ALBUMIN:2,AST:2,ALT:2,ALKPHOS:2,BILITOT:2,BILIDIR:2,IBILI:2 in the last 72 hours Sedimentation Rate  Basename 08/25/11 0550  ESRSEDRATE 76*   C-Reactive Protein  Basename 08/25/11 0550  CRP 4.24*    Microbiology: Recent Results (from the past 240 hour(s))  BODY FLUID CULTURE     Status: Normal (Preliminary result)   Collection Time   08/24/11  3:33 PM      Component Value Range Status Comment   Specimen Description SYNOVIAL FLUID KNEE LEFT   Final    Special Requests SWAB RECIEVED NO 1   Final    Gram Stain PENDING   Incomplete    Culture NO GROWTH   Final    Report Status PENDING   Incomplete   ANAEROBIC CULTURE     Status: Normal (Preliminary result)   Collection Time   08/24/11  3:33 PM      Component Value Range Status Comment   Specimen Description SYNOVIAL FLUID KNEE LEFT   Final    Special Requests SWAB RECIEVED NO 1   Final    Gram Stain     Final    Value: ABUNDANT WBC PRESENT,BOTH PMN AND MONONUCLEAR     NO ORGANISMS SEEN     Performed at [facility]   Culture     Final    Value: NO ANAEROBES ISOLATED; CULTURE IN PROGRESS FOR 5 DAYS   Report Status PENDING   Incomplete   GRAM STAIN     Status: Normal   Collection Time   08/24/11  3:33 PM      Component Value Range Status Comment   Specimen Description SYNOVIAL FLUID KNEE LEFT   Final    Special Requests SWAB RECIEVED NO 1   Final    Gram Stain     Final    Value: ABUNDANT WBC PRESENT,BOTH PMN AND MONONUCLEAR     NO ORGANISMS SEEN   Report Status 08/24/2011 FINAL   Final   TISSUE CULTURE     Status: Normal (Preliminary  result)   Collection Time   08/24/11  3:35 PM      Component Value Range Status Comment   Specimen Description TISSUE KNEE LEFT   Final     Special Requests NO 2   Final    Gram Stain PENDING   Incomplete    Culture NO GROWTH 1 DAY   Final    Report Status PENDING   Incomplete   ANAEROBIC CULTURE     Status: Normal (Preliminary result)   Collection Time   08/24/11  3:35 PM      Component Value Range Status Comment   Specimen Description TISSUE KNEE LEFT   Final    Special Requests NO 2   Final    Gram Stain     Final    Value: ABUNDANT WBC PRESENT,BOTH PMN AND MONONUCLEAR     NO ORGANISMS SEEN     Performed at [facility]   Culture     Final    Value: NO ANAEROBES ISOLATED; CULTURE IN PROGRESS FOR 5 DAYS   Report Status PENDING   Incomplete   GRAM STAIN     Status: Normal   Collection Time   08/24/11  3:35 PM      Component Value Range Status Comment   Specimen Description TISSUE KNEE LEFT   Final    Special Requests NO 2   Final    Gram Stain     Final    Value: ABUNDANT WBC PRESENT,BOTH PMN AND MONONUCLEAR     NO ORGANISMS SEEN   Report Status 08/24/2011 FINAL   Final     Studies/Results: No results found.   Assessment/Plan: DM2 CKD Osteomyelitis/septic arthritis S/p resection of TKR with placement of anbx spacer Day 2 vanco Await Cx, no change in anbx for now. i suggested to him that he may need pic line (again).   [name] Infectious Diseases [phone] 08/25/2011, 6:09 PM   LOS: 1 day

## 2011-08-25 NOTE — Progress Notes (Signed)
UR COMPLETED  

## 2011-08-26 ENCOUNTER — Encounter (HOSPITAL_COMMUNITY): Payer: Self-pay | Admitting: Orthopedic Surgery

## 2011-08-26 DIAGNOSIS — E875 Hyperkalemia: Secondary | ICD-10-CM

## 2011-08-26 DIAGNOSIS — E1165 Type 2 diabetes mellitus with hyperglycemia: Secondary | ICD-10-CM

## 2011-08-26 DIAGNOSIS — E872 Acidosis: Secondary | ICD-10-CM

## 2011-08-26 DIAGNOSIS — T8450XA Infection and inflammatory reaction due to unspecified internal joint prosthesis, initial encounter: Principal | ICD-10-CM

## 2011-08-26 DIAGNOSIS — E118 Type 2 diabetes mellitus with unspecified complications: Secondary | ICD-10-CM

## 2011-08-26 DIAGNOSIS — M009 Pyogenic arthritis, unspecified: Secondary | ICD-10-CM

## 2011-08-26 DIAGNOSIS — Y849 Medical procedure, unspecified as the cause of abnormal reaction of the patient, or of later complication, without mention of misadventure at the time of the procedure: Secondary | ICD-10-CM

## 2011-08-26 LAB — GLUCOSE, CAPILLARY
Glucose-Capillary: 215 mg/dL — ABNORMAL HIGH (ref 70–99)
Glucose-Capillary: 148 mg/dL — ABNORMAL HIGH (ref 70–99)

## 2011-08-26 LAB — TYPE AND SCREEN
ABO/RH(D): A NEG
Unit division: 0

## 2011-08-26 LAB — CBC
HCT: 23.7 % — ABNORMAL LOW (ref 39.0–52.0)
MCHC: 33.3 g/dL (ref 30.0–36.0)
MCV: 84.9 fL (ref 78.0–100.0)
Platelets: 195 10*3/uL (ref 150–400)
RDW: 15.7 % — ABNORMAL HIGH (ref 11.5–15.5)

## 2011-08-26 LAB — BASIC METABOLIC PANEL
BUN: 20 mg/dL (ref 6–23)
Calcium: 9.7 mg/dL (ref 8.4–10.5)
Creatinine, Ser: 1.42 mg/dL — ABNORMAL HIGH (ref 0.50–1.35)
GFR calc Af Amer: 55 mL/min — ABNORMAL LOW (ref >90)
GFR calc non Af Amer: 48 mL/min — ABNORMAL LOW (ref >90)

## 2011-08-26 MED ORDER — HYDROCODONE-ACETAMINOPHEN 5-325 MG PO TABS: 1.0000 | ORAL_TABLET | ORAL | Status: DC | PRN

## 2011-08-26 MED ORDER — OXYCODONE HCL 5 MG PO TABS
5.0000 mg | ORAL_TABLET | Freq: Four times a day (QID) | ORAL | Status: DC | PRN
  Administered 2011-08-27: 5 mg via ORAL
  Filled 2011-08-26: qty 1

## 2011-08-26 MED ORDER — RIVAROXABAN 10 MG PO TABS: 10.0000 mg | ORAL_TABLET | Freq: Every day | ORAL | Status: DC

## 2011-08-26 MED ORDER — DEXTROSE 5 % IV SOLN: 1.0000 g | INTRAVENOUS | Status: DC

## 2011-08-26 MED ORDER — METHOCARBAMOL 500 MG PO TABS: 500.0000 mg | ORAL_TABLET | Freq: Three times a day (TID) | ORAL | Status: AC | PRN

## 2011-08-26 MED ORDER — DEXTROSE 5 % IV SOLN
1.0000 g | INTRAVENOUS | Status: DC
  Administered 2011-08-26: 1 g via INTRAVENOUS
  Filled 2011-08-26 (×2): qty 10

## 2011-08-26 MED ORDER — CITRIC ACID-SODIUM CITRATE 334-500 MG/5ML PO SOLN
30.0000 mL | Freq: Two times a day (BID) | ORAL | Status: DC
  Administered 2011-08-26 (×2): 30 mL via ORAL
  Filled 2011-08-26 (×5): qty 30

## 2011-08-26 NOTE — Progress Notes (Signed)
CARE MANAGEMENT NOTE 08/26/2011   Patient:  [name], [name]   Account Number:  [id number]  Date Initiated:  08/26/2011  Documentation initiated by:  Vance Peper  Subjective/Objective Assessment:   71 yr old male left total knee arthroplasty with antibiotic spacer.     Action/Plan:   Spoke with patient regarding Home Health needs. He was initially setup with [facility], wife says they use [facility], wants to now. notified [facility]. Patient progressing poorly, may need SNF, Social Worker notified. CM Will follow   Anticipated DC Date:  08/27/2011   Anticipated DC Plan:           Choice offered to / List presented to:             Status of service:   Medicare Important Message given?   (If response is "NO", the following Medicare IM given date fields will be blank) Date Medicare IM given:   Date Additional Medicare IM given:    Discharge Disposition:    Per UR Regulation:    If discussed at Long Length of Stay Meetings, dates discussed:    Comments:  08/26/11 1446 Vance Peper, RN BSN Contacted [facility], they can not service the patient. Will speak with his wife in the AM, since patient is groggy.

## 2011-08-26 NOTE — Clinical Social Work Psychosocial (Signed)
     Clinical Social Work Department BRIEF PSYCHOSOCIAL ASSESSMENT 08/26/2011  Patient:  Logan, [name]     Account Number:  [id number]     Admit date:  08/24/2011  Clinical Social Worker:  Jacelyn Grip  Date/Time:  08/26/2011 02:00 PM  Referred by:  Physician  Date Referred:  08/26/2011 Referred for  SNF Placement   Other Referral:   Interview type:  Family Other interview type:   pt wife at bedside    PSYCHOSOCIAL DATA Living Status:  WIFE Admitted from facility:   Level of care:   Primary support name:  [name]/[phone] Primary support relationship to patient:  SPOUSE Degree of support available:   strong    CURRENT CONCERNS Current Concerns  Post-Acute Placement   Other Concerns:    SOCIAL WORK ASSESSMENT / PLAN CSW met with pt and pt wife at bedside to discuss disposition planning. CSW discussed with pt and pt wife that pt insurance will not cover inpatient rehab stay. CSW discussed the discharge options as far as SNF vs home with home health services. CSW discussed with pt wife how much assistance pt was requiring at this time. 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 Pt wife discussed that pt previously in a SNF and pt was unhappy at facility and has expressed that he does not want to go back to a SNF. Pt wife stated that pt previously had [facility] and would like to continue with that company. Pt wife stated that pt is very confused today, but pt wife would like to see if pt is more alert tomorrow and re-address rehab at SNF with pt. Pt wife agreeable to initiating SNF search to [facility] in [place] as an option for discharge plan. CSW completed FL2 and initiated SNF search to [facility]. CSW provided pt wife with private duty care agency list as a resource. CSW to follow up with pt and pt wife in  regard to offer from [facility] and continue to discuss disposition planning. RNCM updated. CSW to continue to follow.   Assessment/plan status:  Psychosocial Support/Ongoing Assessment of Needs Other assessment/ plan:   discharge planning   Information/referral to community resources:   Private Duty Care Agency List    PATIENTS/FAMILYS RESPONSE TO PLAN OF CARE: Pt alert, but disoriented at this time. Pt wife stated that pt confusion likely due to change in pain medication. Pt wife discussed that pt had gone to SNF approximately 4 weeks ago and had expressed that he would not want to return to SNF. Pt wife interested in seeing if [facility] would be able to offer a bed as a secondary option, but current plan is to return home with home health services and wife providing 24 hour assist. Pt wife is eager for pt to be less confused in order to discuss further with pt.

## 2011-08-26 NOTE — Progress Notes (Signed)
[facility] PROGRESS NOTE  [name] ZOX:[id number] DOB: 1941-01-22 DOA: 08/24/2011 PCP: Ailene Ravel, MD, MD  Assessment/Plan: 1. DM 2 -- resumed lantus 5 units Mantoloking  BID on 08/25/11 . Patient has stopped liraglutide 2 months ago. C/w ssi novolog. F/u cbgs 2. Hyperkalemia and acidosis - ? RTA type IV . DCed ARB on 08/25/11. Started oracit 08/25/11 . Kayexalate one dose on 08/25/11 . F/u bmet. UO OK. Avoid nsaids 3. Infected TKR per ortho and ID 4. Depression on paxil 5. CAD , hx of CVA - to resume aspirin as soon as safe from ortho point of view      Active Problems:  DM (diabetes mellitus), type 2 with complications  HYPERCHOLESTEROLEMIA  IIA  ANEMIA, NORMOCYTIC, CHRONIC  DEPRESSION  HYPERTENSION, UNSPECIFIED  CAD, UNSPECIFIED SITE  Hyperkalemia  Infection of prosthetic left knee joint  CKD (chronic kidney disease) stage 3, GFR 30-59 ml/min  Morbid obesity  Hyponatremia  Staphylococcus aureus bacteremia  Code Status: full Family Communication:  Wife  Disposition Plan: ?   Lonia Blood, MD  [facility] Pager [phone]  If 7PM-7AM, please contact night-coverage [email or web address] Password [email or web address] 08/26/2011, 8:14 AM   LOS: 2 days   Brief narrative: 71 yo man admitted by ortho for removal of infected prosthesis  Consultants:  Ortho - Dr. Sheron Nightingale  ID -   Procedures: EXCISIONAL TOTAL KNEE ARTHROPLASTY WITH ANTIBIOTIC SPACERS (Left)    HPI/Subjective: Kind of sleepy today   Objective: Filed Vitals:   08/25/11 1535 08/25/11 1635 08/25/11 2100 08/26/11 0655  BP: 111/54 126/57 138/61 168/98  Pulse: 75 80 75 71  Temp: 98.3 F (36.8 C) 98.2 F (36.8 C) 98.6 F (37 C) 98.3 F (36.8 C)  TempSrc:      Resp: 16 16 18 20   Height:      Weight:      SpO2:   99% 99%    Intake/Output Summary (Last 24 hours) at 08/26/11 0814 Last data filed at 08/26/11 0426  Gross per 24 hour  Intake  893.6 ml  Output    400 ml  Net  493.6 ml     Exam:   General:  Alert and oriented x  Cardiovascular: rrr  Respiratory: ctab  Abdomen: soft, nt  Data Reviewed: Basic Metabolic Panel:  Lab 08/26/11 8295 08/25/11 0550 08/25/11 0007 08/24/11 1244  NA 134* 133* 132* 132*  K 4.7 4.8 -- --  CL 104 103 103 99  CO2 18* 17* 18* 21  GLUCOSE 163* 190* 176* 183*  BUN 20 24* 23 23  CREATININE 1.42* 1.75* 1.64* 1.69*  CALCIUM 9.7 9.3 9.3 10.1  MG -- -- -- --  PHOS -- -- -- --   Liver Function Tests: No results found for this basename: AST:5,ALT:5,ALKPHOS:5,BILITOT:5,PROT:5,ALBUMIN:5 in the last 168 hours No results found for this basename: LIPASE:5,AMYLASE:5 in the last 168 hours No results found for this basename: AMMONIA:5 in the last 168 hours CBC:  Lab 08/26/11 0540 08/25/11 0550  WBC 6.4 6.4  NEUTROABS -- --  HGB 7.9* 7.9*  HCT 23.7* 24.2*  MCV 84.9 86.4  PLT 195 208   Cardiac Enzymes: No results found for this basename: CKTOTAL:5,CKMB:5,CKMBINDEX:5,TROPONINI:5 in the last 168 hours BNP (last 3 results)  Basename 06/15/11 0500  PROBNP 10540.0*   CBG:  Lab 08/26/11 0618 08/25/11 2201 08/25/11 1559 08/25/11 1126 08/25/11 0541  GLUCAP 156* 162* 142* 167* 187*    Recent Results (from the past 240 hour(s))  BODY FLUID CULTURE  Status: Normal (Preliminary result)   Collection Time   08/24/11  3:33 PM      Component Value Range Status Comment   Specimen Description SYNOVIAL FLUID KNEE LEFT   Final    Special Requests SWAB RECIEVED NO 1   Final    Gram Stain PENDING   Incomplete    Culture NO GROWTH   Final    Report Status PENDING   Incomplete   ANAEROBIC CULTURE     Status: Normal (Preliminary result)   Collection Time   08/24/11  3:33 PM      Component Value Range Status Comment   Specimen Description SYNOVIAL FLUID KNEE LEFT   Final    Special Requests SWAB RECIEVED NO 1   Final    Gram Stain     Final    Value: ABUNDANT WBC PRESENT,BOTH PMN AND MONONUCLEAR     NO ORGANISMS SEEN     Performed at  [facility]   Culture     Final    Value: NO ANAEROBES ISOLATED; CULTURE IN PROGRESS FOR 5 DAYS   Report Status PENDING   Incomplete   GRAM STAIN     Status: Normal   Collection Time   08/24/11  3:33 PM      Component Value Range Status Comment   Specimen Description SYNOVIAL FLUID KNEE LEFT   Final    Special Requests SWAB RECIEVED NO 1   Final    Gram Stain     Final    Value: ABUNDANT WBC PRESENT,BOTH PMN AND MONONUCLEAR     NO ORGANISMS SEEN   Report Status 08/24/2011 FINAL   Final   TISSUE CULTURE     Status: Normal (Preliminary result)   Collection Time   08/24/11  3:35 PM      Component Value Range Status Comment   Specimen Description TISSUE KNEE LEFT   Final    Special Requests NO 2   Final    Gram Stain PENDING   Incomplete    Culture NO GROWTH 2 DAYS   Final    Report Status PENDING   Incomplete   ANAEROBIC CULTURE     Status: Normal (Preliminary result)   Collection Time   08/24/11  3:35 PM      Component Value Range Status Comment   Specimen Description TISSUE KNEE LEFT   Final    Special Requests NO 2   Final    Gram Stain     Final    Value: ABUNDANT WBC PRESENT,BOTH PMN AND MONONUCLEAR     NO ORGANISMS SEEN     Performed at [facility]   Culture     Final    Value: NO ANAEROBES ISOLATED; CULTURE IN PROGRESS FOR 5 DAYS   Report Status PENDING   Incomplete   GRAM STAIN     Status: Normal   Collection Time   08/24/11  3:35 PM      Component Value Range Status Comment   Specimen Description TISSUE KNEE LEFT   Final    Special Requests NO 2   Final    Gram Stain     Final    Value: ABUNDANT WBC PRESENT,BOTH PMN AND MONONUCLEAR     NO ORGANISMS SEEN   Report Status 08/24/2011 FINAL   Final      Studies: No results found.  Scheduled Meds:   . acetaminophen  1,000 mg Intravenous Q6H  . atorvastatin  20 mg Oral QHS  . citric  acid-sodium citrate  30 mL Oral QPC breakfast  . diltiazem  240 mg Oral Daily  . docusate sodium  100 mg Oral BID  .  ezetimibe  10 mg Oral QHS  . insulin aspart  0-15 Units Subcutaneous TID WC  . insulin aspart  0-5 Units Subcutaneous QHS  . insulin glargine  5 Units Subcutaneous BID  . metoprolol tartrate  25 mg Oral BID  . omega-3 acid ethyl esters  4 g Oral Daily  . pantoprazole  40 mg Oral Q1200  . PARoxetine  10 mg Oral QHS  . PARoxetine  20 mg Oral Daily  . rivaroxaban  10 mg Oral Daily  . sodium polystyrene  15 g Oral Once  . vancomycin  1,500 mg Intravenous Q24H  . DISCONTD: ezetimibe-simvastatin  1 tablet Oral QHS   Continuous Infusions:   . DISCONTD: sodium chloride

## 2011-08-26 NOTE — Progress Notes (Signed)
PT Progress Note:      08/26/11 1100  PT Visit Information  Last PT Received On 08/26/11  Assistance Needed +2  PT Time Calculation  PT Start Time 0950  PT Stop Time 1010  PT Time Calculation (min) 20 min  Subjective Data  Subjective "You just tell me what to do"  Precautions  Precautions Fall;Knee  Required Braces or Orthoses Knee Immobilizer - Left  Knee Immobilizer - Left On when out of bed or walking  Restrictions  Weight Bearing Restrictions Yes  LLE Weight Bearing PWB  LLE Partial Weight Bearing Percentage or Pounds 50% (pt unable to recall WBing restriction )  Cognition  Overall Cognitive Status Appears within functional limits for tasks assessed/performed  Arousal/Alertness Awake/alert  Orientation Level Appears intact for tasks assessed  Behavior During Session [facility] for tasks performed  Bed Mobility  Bed Mobility Supine to Sit;Sitting - Scoot to Edge of Bed  Supine to Sit 4: Min assist;HOB flat  Sitting - Scoot to [place] of Bed 5: Supervision  Details for Bed Mobility Assistance (A) for LLE.    Transfers  Transfers Sit to Stand;Stand to Sit  Sit to Stand 4: Min assist;From elevated surface;With upper extremity assist;From bed (bed elevated)  Stand to Sit 1: +2 Total assist;Without upper extremity assist;To chair/3-in-1  Stand to Sit: Patient Percentage 60%  Details for Transfer Assistance Cues for hand placement, LLE positioning, technique, safety.  Pt attempting to sit without ensuring seated surface in place.  Pt Kept UE's on RW during stand>sit despite cues to place on armrests of recliner.  Demonstrated decreased safety judgement at this point.     Ambulation/Gait  Stairs No  Engineering geologist No  Balance  Balance Assessed No  PT - End of Session  Equipment Utilized During Treatment Left knee immobilizer;Gait belt  Activity Tolerance Patient tolerated treatment well;Patient limited by pain  Patient left in chair;with call bell/phone  within reach  PT - Assessment/Plan  Comments on Treatment Session Pt's mobility limited by pain per pt.  Pt took 2 steps forwards today & then reports "I can't do anymore. I've got to sit".    PT Plan Discharge plan remains appropriate  PT Frequency Min 5X/week  Follow Up Recommendations Inpatient Rehab  Equipment Recommended Defer to next venue  Acute Rehab PT Goals  Time For Goal Achievement 08/25/11  PT Goal: Supine/Side to Sit - Progress Progressing toward goal  PT Goal: Sit to Stand - Progress Progressing toward goal  PT Transfer Goal: Bed to Chair/Chair to Bed - Progress Not met  PT Goal: Ambulate - Progress Progressing toward goal      Verdell Face, PTA [phone] 08/26/2011

## 2011-08-26 NOTE — Progress Notes (Signed)
Into speak to patient regarding CIR. Per rehab MD consult yesterday prior to PT/OT working with patient, MD felt patient may be able to go home with [facility], but was willing to reconsider if patient didn't make progress or developed any setbacks regarding function. I discussed with insurance case manager for [facility] and CIR is not an option. Patient would be appropriate for SNF or Home with HH per insurance. Will contact wife of patient and make her aware. Discussed with RN CM on floor. Marina Goodell adm coordinator [phone].

## 2011-08-26 NOTE — Progress Notes (Signed)
INFECTIOUS DISEASE PROGRESS NOTE  ID: [name] is a 71 y.o. male with   Active Problems:  DM (diabetes mellitus), type 2 with complications  HYPERCHOLESTEROLEMIA  IIA  ANEMIA, NORMOCYTIC, CHRONIC  DEPRESSION  HYPERTENSION, UNSPECIFIED  CAD, UNSPECIFIED SITE  Hyperkalemia  Infection of prosthetic left knee joint  CKD (chronic kidney disease) stage 3, GFR 30-59 ml/min  Morbid obesity  Hyponatremia  Staphylococcus aureus bacteremia  Subjective: C/o knee pain.  Abtx:  Anti-infectives     Start     Dose/Rate Route Frequency Ordered Stop   08/25/11 1400   vancomycin (VANCOCIN) 1,500 mg in sodium chloride 0.9 % 500 mL IVPB        1,500 mg 250 mL/hr over 120 Minutes Intravenous Every 24 hours 08/24/11 1905     08/24/11 1700   vancomycin (VANCOCIN) powder 2,000 mg  Status:  Discontinued        2,000 mg Other To Surgery 08/24/11 1650 08/24/11 2107   08/24/11 1700   tobramycin (NEBCIN) powder 2.4 g  Status:  Discontinued        2.4 g Topical To Surgery 08/24/11 1651 08/24/11 2319   08/24/11 1530   vancomycin (VANCOCIN) powder 4,000 mg  Status:  Discontinued        4,000 mg Other To Surgery 08/24/11 1523 08/24/11 2319   08/24/11 1520   tobramycin (NEBCIN) powder  Status:  Discontinued          As needed 08/24/11 1520 08/24/11 1808   08/24/11 1515   vancomycin (VANCOCIN) 1,500 mg in sodium chloride 0.9 % 500 mL IVPB  Status:  Discontinued        1,500 mg 250 mL/hr over 120 Minutes Intravenous To Surgery 08/24/11 1516 08/24/11 1904   08/24/11 1504   vancomycin (VANCOCIN) powder  Status:  Discontinued          As needed 08/24/11 1505 08/24/11 1808   08/24/11 1503   tobramycin (NEBCIN) injection  Status:  Discontinued          As needed 08/24/11 1504 08/24/11 1808   08/24/11 1430   vancomycin (VANCOCIN) powder 500 mg  Status:  Discontinued        500 mg Other To Surgery 08/24/11 1431 08/24/11 2319   08/24/11 1430   tobramycin (NEBCIN) injection 320 mg  Status:   Discontinued        320 mg Intramuscular To Surgery 08/24/11 1431 08/24/11 2319          Medications:  Scheduled:   . acetaminophen  1,000 mg Intravenous Q6H  . atorvastatin  20 mg Oral QHS  . citric acid-sodium citrate  30 mL Oral BID PC  . diltiazem  240 mg Oral Daily  . docusate sodium  100 mg Oral BID  . ezetimibe  10 mg Oral QHS  . insulin aspart  0-15 Units Subcutaneous TID WC  . insulin aspart  0-5 Units Subcutaneous QHS  . insulin glargine  5 Units Subcutaneous BID  . metoprolol tartrate  25 mg Oral BID  . omega-3 acid ethyl esters  4 g Oral Daily  . pantoprazole  40 mg Oral Q1200  . PARoxetine  10 mg Oral QHS  . PARoxetine  20 mg Oral Daily  . rivaroxaban  10 mg Oral Daily  . vancomycin  1,500 mg Intravenous Q24H  . DISCONTD: citric acid-sodium citrate  30 mL Oral QPC breakfast    Objective: Vital signs in last 24 hours: Temp:  [97.6 F (36.4 C)-98.6 F (  37 C)] 97.6 F (36.4 C) (05/23 1300) Pulse Rate:  [66-80] 66  (05/23 1300) Resp:  [16-20] 18  (05/23 1300) BP: (107-168)/(54-98) 117/66 mmHg (05/23 1300) SpO2:  [96 %-99 %] 96 % (05/23 1300)   General appearance: alert, cooperative and no distress Extremities: L knee wrapped  Lab Results  Basename 08/26/11 0540 08/25/11 0550  WBC 6.4 6.4  HGB 7.9* 7.9*  HCT 23.7* 24.2*  NA 134* 133*  K 4.7 4.8  CL 104 103  CO2 18* 17*  BUN 20 24*  CREATININE 1.42* 1.75*  GLU -- --   Liver Panel No results found for this basename: PROT:2,ALBUMIN:2,AST:2,ALT:2,ALKPHOS:2,BILITOT:2,BILIDIR:2,IBILI:2 in the last 72 hours Sedimentation Rate  Basename 08/25/11 0550  ESRSEDRATE 76*   C-Reactive Protein  Basename 08/25/11 0550  CRP 4.24*    Microbiology: Recent Results (from the past 240 hour(s))  BODY FLUID CULTURE     Status: Normal (Preliminary result)   Collection Time   08/24/11  3:33 PM      Component Value Range Status Comment   Specimen Description SYNOVIAL FLUID KNEE LEFT   Final    Special  Requests SWAB RECIEVED NO 1   Final    Gram Stain PENDING   Incomplete    Culture NO GROWTH 1 DAY   Final    Report Status PENDING   Incomplete   ANAEROBIC CULTURE     Status: Normal (Preliminary result)   Collection Time   08/24/11  3:33 PM      Component Value Range Status Comment   Specimen Description SYNOVIAL FLUID KNEE LEFT   Final    Special Requests SWAB RECIEVED NO 1   Final    Gram Stain     Final    Value: ABUNDANT WBC PRESENT,BOTH PMN AND MONONUCLEAR     NO ORGANISMS SEEN     Performed at [facility]   Culture     Final    Value: NO ANAEROBES ISOLATED; CULTURE IN PROGRESS FOR 5 DAYS   Report Status PENDING   Incomplete   GRAM STAIN     Status: Normal   Collection Time   08/24/11  3:33 PM      Component Value Range Status Comment   Specimen Description SYNOVIAL FLUID KNEE LEFT   Final    Special Requests SWAB RECIEVED NO 1   Final    Gram Stain     Final    Value: ABUNDANT WBC PRESENT,BOTH PMN AND MONONUCLEAR     NO ORGANISMS SEEN   Report Status 08/24/2011 FINAL   Final   TISSUE CULTURE     Status: Normal (Preliminary result)   Collection Time   08/24/11  3:35 PM      Component Value Range Status Comment   Specimen Description TISSUE KNEE LEFT   Final    Special Requests NO 2   Final    Gram Stain PENDING   Incomplete    Culture NO GROWTH 2 DAYS   Final    Report Status PENDING   Incomplete   ANAEROBIC CULTURE     Status: Normal (Preliminary result)   Collection Time   08/24/11  3:35 PM      Component Value Range Status Comment   Specimen Description TISSUE KNEE LEFT   Final    Special Requests NO 2   Final    Gram Stain     Final    Value: ABUNDANT WBC PRESENT,BOTH PMN AND MONONUCLEAR     NO ORGANISMS  SEEN     Performed at [facility]   Culture     Final    Value: NO ANAEROBES ISOLATED; CULTURE IN PROGRESS FOR 5 DAYS   Report Status PENDING   Incomplete   GRAM STAIN     Status: Normal   Collection Time   08/24/11  3:35 PM      Component  Value Range Status Comment   Specimen Description TISSUE KNEE LEFT   Final    Special Requests NO 2   Final    Gram Stain     Final    Value: ABUNDANT WBC PRESENT,BOTH PMN AND MONONUCLEAR     NO ORGANISMS SEEN   Report Status 08/24/2011 FINAL   Final     Studies/Results: No results found.   Assessment/Plan: DM2  CKD  Osteomyelitis/septic arthritis  S/p resection of L TKR with placement of anbx spacer  Day 3 vanco  Await Cx- ngtd so far, no change in anbx for now.  If Cx (-) would add GNR coverage (ceftraixone) i suggested to him that he may need pic line (again). Will order.    Johny Sax Infectious Diseases [phone] 08/26/2011, 4:20 PM   LOS: 2 days

## 2011-08-26 NOTE — Progress Notes (Addendum)
Clinical Social Work Department CLINICAL SOCIAL WORK PLACEMENT NOTE 08/26/2011  Patient:  [name], [name]  Account Number:  [id number] Admit date:  08/24/2011  Clinical Social Worker:  Jacelyn Grip  Date/time:  08/26/2011 02:30 PM  Clinical Social Work is seeking post-discharge placement for this patient at the following level of care:   SKILLED NURSING   (*CSW will update this form in Epic as items are completed)     Patient/family provided with [facility] Department of Clinical Social Work's list of facilities offering this level of care within the geographic area requested by the patient (or if unable, by the patient's family).  08/26/2011  Patient/family informed of their freedom to choose among providers that offer the needed level of care, that participate in Medicare, Medicaid or managed care program needed by the patient, have an available bed and are willing to accept the patient.    Patient/family informed of [facility]' ownership interest in [facility], as well as of the fact that they are under no obligation to receive care at this facility.  PASARR submitted to [employer] on 06/22/2011 PASARR number received from [employer] on 06/22/2011  FL2 transmitted to all facilities in geographic area requested by pt/family on  08/26/2011 FL2 transmitted to all facilities within larger geographic area on   Patient informed that his/her managed care company has contracts with or will negotiate with  certain facilities, including the following:     Patient/family informed of bed offers received:08/27/2011 SB  Patient chooses bed at [facility] Physician recommends and patient chooses bed at    Patient to be transferred to  on [facility] on 08/27/2011 SB Patient to be transferred to facility by ambulance  The following physician request were entered in Epic:   Additional Comments: Pt wife request SNF search to [facility] [place]  [facility] as secondary option, but current plan is for pt to return home with [facility] services.    Jacklynn Lewis, MSW, LCSWA  Clinical Social Work [phone]

## 2011-08-26 NOTE — Progress Notes (Signed)
Patient ID: [name], male   DOB: 1940/08/18, 71 y.o.   MRN: [id number] PATIENT ID: [name]        MRN:  [id number]          DOB/AGE: 21-Feb-1941 / 71 y.o.  Logan Campbell, MD   Logan Code, PA-C [address] [place], [place], [place]  [address]                             [phone]   PROGRESS NOTE  Subjective:  negative for Chest Pain  negative for Shortness of Breath  negative for Nausea/Vomiting   negative for Calf Pain  positive for Bowel Movement   Tolerating Diet: yes         Patient reports pain as mild.     Doing very well.  States pain in proximal tibia now gone.  Awaiting disposition.  Objective: Vital signs in last 24 hours:   Patient Vitals for the past 24 hrs:  BP Temp Pulse Resp SpO2  08/26/11 1300 117/66 mmHg 97.6 F (36.4 C) 66  18  96 %  08/26/11 1106 107/54 mmHg - - - -  08/26/11 1105 107/54 mmHg - - - -  08/26/11 0655 168/98 mmHg 98.3 F (36.8 C) 71  20  99 %  08/25/11 2100 138/61 mmHg 98.6 F (37 C) 75  18  99 %      Intake/Output from previous day:   05/22 0701 - 05/23 0700 In: 893.6 [P.O.:578.6] Out: 400 [Urine:350; Drains:50]   Intake/Output this shift:   05/23 0701 - 05/23 1900 In: 480 [P.O.:480] Out: -    Intake/Output      05/22 0701 - 05/23 0700 05/23 0701 - 05/24 0700   P.O. 578.6 480   I.V. (mL/kg)     Blood 315    Total Intake(mL/kg) 893.6 (8) 480 (4.3)   Urine (mL/kg/hr) 350 (0.1)    Drains 50    Total Output 400    Net +493.6 +480           LABORATORY DATA:  Basename 08/26/11 0540 08/25/11 0550  WBC 6.4 6.4  HGB 7.9* 7.9*  HCT 23.7* 24.2*  PLT 195 208    Basename 08/26/11 0540 08/25/11 0550 08/25/11 0007 08/24/11 1244  NA 134* 133* 132* 132*  K 4.7 4.8 5.8* 5.6*  CL 104 103 103 99  CO2 18* 17* 18* 21  BUN 20 24* 23 23  CREATININE 1.42* 1.75* 1.64* 1.69*  GLUCOSE 163* 190* 176* 183*  CALCIUM 9.7 9.3 9.3 10.1   Lab Results  Component Value Date   INR 1.10 08/10/2011   INR 1.19 06/12/2011   INR  1.99* 01/28/2009    Examination:  General appearance: alert, cooperative and mild distress Resp: clear to auscultation bilaterally Cardio: regular rate and rhythm GI: normal findings: bowel sounds normal  Wound Exam: clean, dry, intact   Drainage:  None: wound tissue dry  Motor Exam: EHL, FHL, Anterior Tibial and Posterior Tibial Intact  Sensory Exam: Superficial Peroneal, Deep Peroneal and Tibial normal  Vascular Exam: Left posterior tibial artery has 1+ (weak) pulse  Assessment:    2 Days Post-Op  Procedure(s) (LRB): EXCISIONAL TOTAL KNEE ARTHROPLASTY WITH ANTIBIOTIC SPACERS (Left)  ADDITIONAL DIAGNOSIS:  Active Problems:  DM (diabetes mellitus), type 2 with complications  HYPERCHOLESTEROLEMIA  IIA  ANEMIA, NORMOCYTIC, CHRONIC  DEPRESSION  HYPERTENSION, UNSPECIFIED  CAD, UNSPECIFIED SITE  Hyperkalemia  Infection of prosthetic  left knee joint  CKD (chronic kidney disease) stage 3, GFR 30-59 ml/min  Morbid obesity  Hyponatremia  Staphylococcus aureus bacteremia  Acute Blood Loss Anemia, Hyponatremia, Diabetes and Renal Insufficiency Chronic   Plan: Physical Therapy as ordered Partial Weight Bearing @ 50% (PWB)  DVT Prophylaxis:  Xarelto, Foot Pumps and TED hose  DISCHARGE PLAN: Home vs Inpatient rehab  DISCHARGE NEEDS: HHPT, HHRN, Walker, 3-in-1 comode seat and IV Antibiotics  Awaiting PICC line       Zonia Caplin 08/26/2011, 5:00 PM

## 2011-08-26 NOTE — Progress Notes (Signed)
Patient ID: [name], male   DOB: 05/08/1940, 71 y.o.   MRN: [id number] PATIENT ID: [name]        MRN:  [id number]          DOB/AGE: [birth year] / 71 y.o.  Logan Campbell, MD   Logan Code, PA-C [address] [place], [place], [place]  [address]                             [phone]   PROGRESS NOTE  Subjective:  negative for Chest Pain  negative for Shortness of Breath  negative for Nausea/Vomiting   negative for Calf Pain  positive for Bowel Movement   Tolerating Diet: yes         Patient reports pain as mild.      Objective: Vital signs in last 24 hours:   Patient Vitals for the past 24 hrs:  BP Temp Pulse Resp SpO2  08/26/11 1300 117/66 mmHg 97.6 F (36.4 C) 66  18  96 %  08/26/11 1106 107/54 mmHg - - - -  08/26/11 1105 107/54 mmHg - - - -  08/26/11 0655 168/98 mmHg 98.3 F (36.8 C) 71  20  99 %  08/25/11 2100 138/61 mmHg 98.6 F (37 C) 75  18  99 %      Intake/Output from previous day:   05/22 0701 - 05/23 0700 In: 893.6 [P.O.:578.6] Out: 400 [Urine:350; Drains:50]   Intake/Output this shift:   05/23 0701 - 05/23 1900 In: 480 [P.O.:480] Out: -    Intake/Output      05/22 0701 - 05/23 0700 05/23 0701 - 05/24 0700   P.O. 578.6 480   I.V. (mL/kg)     Blood 315    Total Intake(mL/kg) 893.6 (8) 480 (4.3)   Urine (mL/kg/hr) 350 (0.1)    Drains 50    Total Output 400    Net +493.6 +480           LABORATORY DATA:  Basename 08/26/11 0540 08/25/11 0550  WBC 6.4 6.4  HGB 7.9* 7.9*  HCT 23.7* 24.2*  PLT 195 208    Basename 08/26/11 0540 08/25/11 0550 08/25/11 0007 08/24/11 1244  NA 134* 133* 132* 132*  K 4.7 4.8 5.8* 5.6*  CL 104 103 103 99  CO2 18* 17* 18* 21  BUN 20 24* 23 23  CREATININE 1.42* 1.75* 1.64* 1.69*  GLUCOSE 163* 190* 176* 183*  CALCIUM 9.7 9.3 9.3 10.1   Lab Results  Component Value Date   INR 1.10 08/10/2011   INR 1.19 06/12/2011   INR 1.99* 01/28/2009    Examination:  General appearance: alert and no  distress  Wound Exam: clean, dry, intact   Drainage:  None: wound tissue dry  Motor Exam: EHL, FHL, Anterior Tibial and Posterior Tibial Intact  Sensory Exam: Superficial Peroneal, Deep Peroneal and Tibial normal  Vascular Exam: Normal  Assessment:    2 Days Post-Op  Procedure(s) (LRB): EXCISIONAL TOTAL KNEE ARTHROPLASTY WITH ANTIBIOTIC SPACERS (Left)  ADDITIONAL DIAGNOSIS:  Active Problems:  DM (diabetes mellitus), type 2 with complications  HYPERCHOLESTEROLEMIA  IIA  ANEMIA, NORMOCYTIC, CHRONIC  DEPRESSION  HYPERTENSION, UNSPECIFIED  CAD, UNSPECIFIED SITE  Hyperkalemia  Infection of prosthetic left knee joint  CKD (chronic kidney disease) stage 3, GFR 30-59 ml/min  Morbid obesity  Hyponatremia  Staphylococcus aureus bacteremia  Acute Blood Loss Anemia- has had two units packed cells- H&H stable, asymptomatic  Plan: Physical Therapy as ordered Partial Weight Bearing @ 50% (PWB)  DVT Prophylaxis:  Xarelto  DISCHARGE PLAN: Skilled Nursing Facility/Rehab  DISCHARGE NEEDS: IV Antibiotics     Will need new pic line inserted, not sure of rehab status-pt prefers home to any other rehab facility other than [facility], [name] W 08/26/2011, 5:03 PM

## 2011-08-26 NOTE — Discharge Summary (Signed)
Logan Campbell, MD   Logan Code, PA-C [address] [place], [place], [place]  [address]                             [phone]  PATIENT ID: [name]        MRN:  [id number]          DOB/AGE: Nov 24, 1940 / 71 y.o.    DISCHARGE SUMMARY  ADMISSION DATE:    08/24/2011 DISCHARGE DATE:   08/27/2011   ADMISSION DIAGNOSIS: infection left total knee    DISCHARGE DIAGNOSIS:  infection left total knee    ADDITIONAL DIAGNOSIS: Active Problems:  DM (diabetes mellitus), type 2 with complications  HYPERCHOLESTEROLEMIA  IIA  ANEMIA, NORMOCYTIC, CHRONIC  DEPRESSION  HYPERTENSION, UNSPECIFIED  CAD, UNSPECIFIED SITE  Hyperkalemia  Infection of prosthetic left knee joint  CKD (chronic kidney disease) stage 3, GFR 30-59 ml/min  Morbid obesity  Hyponatremia  Staphylococcus aureus bacteremia  Past Medical History  Diagnosis Date  . Gout   . Depression   . Diabetes mellitus   . Unspecified essential hypertension   . Hypercholesteremia     IIA  . Colitis, ulcerative     NOS  . Personal hx-rectal/anal malignancy   . Urinary incontinence   . Benign prostatic hypertrophy     S/p TURP   . H/O ETOH abuse 06/06/2011  . 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 History of left knee surgery   . Pleurisy   . AAA (abdominal aortic aneurysm) 1992    S/p repair and grafting  . History of tobacco abuse   . History of alcohol abuse   . CAD (coronary artery disease)   . Stroke     71 yrs old  . Bronchitis   . Stones in the urinary tract   . GERD (gastroesophageal reflux disease)   . Arthritis   . Normocytic anemia     Chronic  . Cancer 2006    bladder  . Bladder cancer     Transitional Cell  . PAF (paroxysmal atrial fibrillation)     during hospitalization 06/2011 for sepsis  . Cardiomyopathy, ischemic     EF 40-45% by echo 06/2011    PROCEDURE: Procedure(s): EXCISIONAL LEFT TOTAL KNEE ARTHROPLASTY WITH ANTIBIOTIC SPACERS on  08/24/2011  CONSULTS:   INFECTIOUS DISEASE & TRIAD HOSPITALIST  HISTORY:  The patient is a very pleasant 71 year old white male who is seen today for follow-up evaluation of his left knee. On 06/12/11 he presented to the [facility] with significant pain and discomfort in his left knee. He had been hospitalized from 06/04/11 through 06/08/11 with chest pain and bronchitis. The chest pain was thought to be that of bronchitis. He was placed on steroids and discharged with a steroid taper on 06/08/11. Subsequently after that discharge on 06/09/11 the patient had developed left knee pain. He reported that his knee pain had been increasing and he went to see his primary care physician on 06/11/11 and had his left knee tapped. He had increasing pain in the left knee and also the ankle during the night of 06/11/11 and actually started having right knee pain. He presented to the hospital on 06/12/11 and was noted to have significant pain, discomfort, and swelling in the knee. He was afebrile at the time. However, because of his swelling it was felt that he did have an aspiration in the hospital which  revealed intracellular bacteria and Dr. Madelon Lips took him to the operating room on 06/12/11 where he performed incision, drainage, irrigation, and debridement of the left knee and a poly exchange. He was admitted to the hospital at that time and was placed on antibiotics. He was noted to have methicillin sensitive staph aureus in his blood as well as in his left knee. His right knee was clean. During his hospital stay we did do several aspirations of fluid from the knee and the subcutaneous areas. An I&D locally at the distal portion of the wound was performed and this was packed up until about two days ago. Since that time he has had antibiotics and has been followed by Dr. Bonnita Nasuti. He has had improvement in his pain, but he still continues to have significant swelling and pain in the knee.    HOSPITAL COURSE:   [name] is a 71 y.o. admitted on 08/24/2011 and found to have a diagnosis of infection left total knee.  After appropriate laboratory studies were obtained  they were taken to the operating room on 08/24/2011 and underwent Procedure(s): EXCISIONAL TOTAL KNEE ARTHROPLASTY WITH ANTIBIOTIC SPACERS.   They were given perioperative antibiotics:  Anti-infectives     Start     Dose/Rate Route Frequency Ordered Stop   08/26/11 1700   cefTRIAXone (ROCEPHIN) 1 g in dextrose 5 % 50 mL IVPB        1 g 100 mL/hr over 30 Minutes Intravenous Every 24 hours 08/26/11 1626     08/26/11 0000   dextrose 5 % SOLN 50 mL with cefTRIAXone 1 G SOLR 1 g        1 g 100 mL/hr over 30 Minutes Intravenous Every 24 hours 08/26/11 1732     08/25/11 1400   vancomycin (VANCOCIN) 1,500 mg in sodium chloride 0.9 % 500 mL IVPB        1,500 mg 250 mL/hr over 120 Minutes Intravenous Every 24 hours 08/24/11 1905     08/24/11 1700   vancomycin (VANCOCIN) powder 2,000 mg  Status:  Discontinued        2,000 mg Other To Surgery 08/24/11 1650 08/24/11 2107   08/24/11 1700   tobramycin (NEBCIN) powder 2.4 g  Status:  Discontinued        2.4 g Topical To Surgery 08/24/11 1651 08/24/11 2319   08/24/11 1530   vancomycin (VANCOCIN) powder 4,000 mg  Status:  Discontinued        4,000 mg Other To Surgery 08/24/11 1523 08/24/11 2319   08/24/11 1520   tobramycin (NEBCIN) powder  Status:  Discontinued          As needed 08/24/11 1520 08/24/11 1808   08/24/11 1515   vancomycin (VANCOCIN) 1,500 mg in sodium chloride 0.9 % 500 mL IVPB  Status:  Discontinued        1,500 mg 250 mL/hr over 120 Minutes Intravenous To Surgery 08/24/11 1516 08/24/11 1904   08/24/11 1504   vancomycin (VANCOCIN) powder  Status:  Discontinued          As needed 08/24/11 1505 08/24/11 1808   08/24/11 1503   tobramycin (NEBCIN) injection  Status:  Discontinued          As needed 08/24/11 1504 08/24/11 1808   08/24/11 1430   vancomycin (VANCOCIN) powder  500 mg  Status:  Discontinued        500 mg Other To Surgery 08/24/11 1431 08/24/11 2319   08/24/11 1430   tobramycin (NEBCIN)  injection 320 mg  Status:  Discontinued        320 mg Intramuscular To Surgery 08/24/11 1431 08/24/11 2319        .  Tolerated the procedure well.  Placed with a foley intraoperatively.  Given Ofirmev at induction and for 48 hours. Give one unit PRBC in PACU. Consults with ID and Hospitalists made.  POD #1, allowed out of bed to a chair.  PT for ambulation and exercise program.  Foley D/C'd in morning.  IV saline locked.  O2 discontionued. Hemovac pulled.  Given 1 unit PRBC.  POD #2, continued PT and ambulation. Had BM and was urinating without difficulty.  Marland Kitchen POD #3, PICC line to be placed today.  Continued PT.  No complaints.    The remainder of the hospital course was dedicated to ambulation and strengthening.   The patient was discharged on 3 Days Post-Op in  Stable condition.  Blood products given:two units PRBC  DIAGNOSTIC STUDIES: Recent vital signs:  Patient Vitals for the past 24 hrs:  BP Temp Pulse Resp SpO2  08/27/11 0459 138/73 mmHg 97.4 F (36.3 C) 68  18  96 %  08-27-2011 2123 129/82 mmHg 97 F (36.1 C) 61  18  97 %  Aug 27, 2011 2051 134/75 mmHg 98.4 F (36.9 C) 74  18  96 %  08/27/2011 1300 117/66 mmHg 97.6 F (36.4 C) 66  18  96 %  08/27/11 1106 107/54 mmHg - - - -  08/27/11 1105 107/54 mmHg - - - -       Recent laboratory studies:  Basename 08/27/11 0625 Aug 27, 2011 0540 08/25/11 0550  WBC 7.3 6.4 6.4  HGB 7.8* 7.9* 7.9*  HCT 23.7* 23.7* 24.2*  PLT 220 195 208    Basename Aug 27, 2011 0540 08/25/11 0550 08/25/11 0007 08/24/11 1244  NA 134* 133* 132* 132*  K 4.7 4.8 5.8* 5.6*  CL 104 103 103 99  CO2 18* 17* 18* 21  BUN 20 24* 23 23  CREATININE 1.42* 1.75* 1.64* 1.69*  GLUCOSE 163* 190* 176* 183*  CALCIUM 9.7 9.3 9.3 10.1   Lab Results  Component Value Date   INR 1.10 08/10/2011   INR 1.19 06/12/2011   INR 1.99* 01/28/2009      Recent Radiographic Studies :  No results found.  DISCHARGE INSTRUCTIONS: Discharge Orders    Future Orders Please Complete By Expires   Diet - low sodium heart healthy      Comments:   DIET AS PER PRIOR TO HOSPITALIZATION   Call MD / Call 911      Comments:   If you experience chest pain or shortness of breath, CALL 911 and be transported to the hospital emergency room.  If you develope a fever above 101 F, pus (white drainage) or increased drainage or redness at the wound, or calf pain, call your surgeon's office.   Constipation Prevention      Comments:   Drink plenty of fluids.  Prune juice may be helpful.  You may use a stool softener, such as Colace (over the counter) 100 mg twice a day.  Use MiraLax (over the counter) for constipation as needed.   Increase activity slowly as tolerated      Patient may shower      Comments:   You may shower without a dressing once there is no drainage.  Do not wash over the wound.  If drainage remains, cover wound with plastic wrap and then shower.   Driving restrictions  Comments:   No driving for 6 weeks   Lifting restrictions      Comments:   No lifting for 6 weeks   TED hose      Comments:   Use stockings (TED hose) for 3 weeks on LEFT leg(s).  You may remove them at night for sleeping.   Change dressing      Comments:   Change dressing on SUNDAY, then change the dressing daily with sterile 4 x 4 inch gauze dressing and apply TED hose.  You may clean the incision with alcohol prior to redressing.   Do not put a pillow under the knee. Place it under the heel.      Discharge instructions      Comments:   IV ANTIBIOTICS AS PER HOME HEALTH.      DISCHARGE MEDICATIONS:   Medication List  As of 08/27/2011  8:04 AM   STOP taking these medications         aspirin 81 MG tablet      CINNAMON PO         TAKE these medications         dextrose 5 % SOLN 50 mL with cefTRIAXone 1 G SOLR 1 g   Inject 1 g into the vein daily.       diltiazem 240 MG 24 hr capsule   Commonly known as: CARDIZEM CD   Take 240 mg by mouth daily.      ezetimibe-simvastatin 10-40 MG per tablet   Commonly known as: VYTORIN   Take 1 tablet by mouth at bedtime.      HYDROcodone-acetaminophen 5-325 MG per tablet   Commonly known as: NORCO   Take 1-2 tablets by mouth every 4 (four) hours as needed. pain      insulin aspart 100 UNIT/ML injection   Commonly known as: novoLOG   Inject into the skin 3 (three) times daily before meals. 16units am, 20 units noon, 14 units pm      insulin glargine 100 UNIT/ML injection   Commonly known as: LANTUS   Inject 10 Units into the skin at bedtime.      levalbuterol 0.63 MG/3ML nebulizer solution   Commonly known as: XOPENEX   Take 0.63 mg by nebulization every 6 (six) hours as needed. Shortness of breath      Liraglutide 18 MG/3ML Soln   Inject into the skin. 1.2 mg daily am      LORazepam 0.5 MG tablet   Commonly known as: ATIVAN   Take 0.5 mg by mouth every 8 (eight) hours as needed.      losartan 100 MG tablet   Commonly known as: COZAAR   Take 100 mg by mouth daily.      methocarbamol 500 MG tablet   Commonly known as: ROBAXIN   Take 1 tablet (500 mg total) by mouth every 8 (eight) hours as needed (spasms).      metoprolol tartrate 25 MG tablet   Commonly known as: LOPRESSOR   Take 25 mg by mouth 2 (two) times daily.      multivitamin tablet   Take 1 tablet by mouth daily.      omega-3 acid ethyl esters 1 G capsule   Commonly known as: LOVAZA   Take 4 g by mouth daily.      pantoprazole 40 MG tablet   Commonly known as: PROTONIX   Take 40 mg by mouth daily at 12 noon.      PARoxetine 20 MG  tablet   Commonly known as: PAXIL   Take 10-20 mg by mouth 2 (two) times daily. 1 tab in am, 0.5 tab in pm      rivaroxaban 10 MG Tabs tablet   Commonly known as: XARELTO   Take 1 tablet (10 mg total) by mouth daily.      traZODone 25 mg Tabs   Commonly known as: DESYREL   Take 25 mg  by mouth at bedtime as needed. sleep           DR Lavera Guise PLANS TO REVIEW ALL MEDS AND MAKE ANY ADJUSTMENTS BEFORE FINAL D/C TO HOME.  FOLLOW UP VISIT:   Follow-up Information    Follow up with [facility] on 09/08/2011.   Contact information:   [address] [place] [address] [phone]          DISPOSITION:  Home    CONDITION:  Stable   Vaunda Gutterman 08/27/2011, 8:04 AM

## 2011-08-27 DIAGNOSIS — M009 Pyogenic arthritis, unspecified: Secondary | ICD-10-CM

## 2011-08-27 DIAGNOSIS — E872 Acidosis: Secondary | ICD-10-CM

## 2011-08-27 DIAGNOSIS — E1165 Type 2 diabetes mellitus with hyperglycemia: Secondary | ICD-10-CM

## 2011-08-27 DIAGNOSIS — E118 Type 2 diabetes mellitus with unspecified complications: Secondary | ICD-10-CM

## 2011-08-27 DIAGNOSIS — E875 Hyperkalemia: Secondary | ICD-10-CM

## 2011-08-27 LAB — CBC
MCHC: 32.9 g/dL (ref 30.0–36.0)
Platelets: 220 10*3/uL (ref 150–400)
RDW: 15.7 % — ABNORMAL HIGH (ref 11.5–15.5)

## 2011-08-27 LAB — BASIC METABOLIC PANEL
BUN: 17 mg/dL (ref 6–23)
Calcium: 10.2 mg/dL (ref 8.4–10.5)
Creatinine, Ser: 1.36 mg/dL — ABNORMAL HIGH (ref 0.50–1.35)
Potassium: 4.5 mEq/L (ref 3.5–5.1)
Sodium: 136 mEq/L (ref 135–145)

## 2011-08-27 LAB — TISSUE CULTURE: Culture: NO GROWTH

## 2011-08-27 MED ORDER — VANCOMYCIN HCL 1000 MG IV SOLR: 1500.0000 mg | INTRAVENOUS | Status: DC

## 2011-08-27 MED ORDER — SODIUM BICARBONATE 650 MG PO TABS
650.0000 mg | ORAL_TABLET | Freq: Once | ORAL | Status: DC
  Filled 2011-08-27: qty 1

## 2011-08-27 MED ORDER — HEPARIN SOD (PORK) LOCK FLUSH 100 UNIT/ML IV SOLN
250.0000 [IU] | INTRAVENOUS | Status: DC | PRN
  Administered 2011-08-27: 250 [IU]
  Filled 2011-08-27: qty 3

## 2011-08-27 MED ORDER — SODIUM CHLORIDE 0.9 % IJ SOLN
10.0000 mL | INTRAMUSCULAR | Status: DC | PRN
Start: 2011-08-27 — End: 2011-08-27
  Administered 2011-08-27: 10 mL

## 2011-08-27 MED ORDER — SODIUM BICARBONATE 650 MG PO TABS: 650.0000 mg | ORAL_TABLET | Freq: Every day | ORAL | Status: DC

## 2011-08-27 MED ORDER — HEPARIN SOD (PORK) LOCK FLUSH 100 UNIT/ML IV SOLN
250.0000 [IU] | Freq: Every day | INTRAVENOUS | Status: DC
  Filled 2011-08-27: qty 3

## 2011-08-27 NOTE — Progress Notes (Signed)
PT Progress Note:     08/27/11 1300  PT Visit Information  Last PT Received On 08/27/11  Assistance Needed +2  PT Time Calculation  PT Start Time 1212  PT Stop Time 1231  PT Time Calculation (min) 19 min  Precautions  Precautions Fall;Knee  Required Braces or Orthoses Knee Immobilizer - Left  Knee Immobilizer - Left On when out of bed or walking  Restrictions  LLE Weight Bearing PWB  LLE Partial Weight Bearing Percentage or Pounds 50%  Cognition  Overall Cognitive Status Impaired  Area of Impairment Safety/judgement  Orientation Level Appears intact for tasks assessed  Behavior During Session [facility] for tasks performed  Safety/Judgement Impulsive;Decreased safety judgement for tasks assessed  Bed Mobility  Bed Mobility Supine to Sit;Sitting - Scoot to Edge of Bed  Supine to Sit 4: Min assist;HOB flat  Sitting - Scoot to [place] of Bed 6: Modified independent (Device/Increase time)  Details for Bed Mobility Assistance (A) for LLE.    Transfers  Transfers Sit to Stand;Stand to Sit  Sit to Stand 4: Min assist;4: Min guard;With upper extremity assist;With armrests;From bed;From chair/3-in-1  Stand to Sit 4: Min assist;With upper extremity assist;With armrests;To chair/3-in-1  Details for Transfer Assistance Cues for hand placement & LLE positioning.  (A) to achieve standing from bed & (A) to control descent to recliner.    Ambulation/Gait  Ambulation/Gait Assistance 4: Min assist (+2 to follow with recliner)  Ambulation Distance (Feet) 15 Feet (5' + 10' with seated rest break between. )  Assistive device Rolling walker  Ambulation/Gait Assistance Details Cues for sequencing, safe advancement of RW, upright posture, & technique.  (A) for balance, minor (A) to advance LE, safe advancement of RW.    Gait Pattern Step-to pattern;Decreased step length - right;Decreased stance time - left  Stairs No  Wheelchair Mobility  Wheelchair Mobility No  Balance  Balance Assessed No  PT - End of  Session  Equipment Utilized During Treatment Left knee immobilizer;Gait belt  Activity Tolerance Patient tolerated treatment well  Patient left in chair;with call bell/phone within reach;with family/visitor present  PT - Assessment/Plan  Comments on Treatment Session Pt reports pain better controlled today.  SW present with pt upon arrival & reports pt has decided/agreed for ST-SNF prior to d/c home. [employer] will not cover CIR).  Pt would strongly benefit from SNF prior to d/c home to maximize safety, functional mobility, strength.   Pt able to progress with ambulation goal today.    PT Plan Discharge plan needs to be updated;Frequency remains appropriate  PT Frequency Min 5X/week  Follow Up Recommendations Skilled nursing facility  Equipment Recommended Defer to next venue  Acute Rehab PT Goals  Time For Goal Achievement 08/25/11  Potential to Achieve Goals Good  PT Goal: Supine/Side to Sit - Progress Not met  PT Goal: Sit to Stand - Progress Progressing toward goal  PT Goal: Ambulate - Progress Progressing toward goal     Verdell Face, PTA [phone] 08/27/2011

## 2011-08-27 NOTE — Progress Notes (Signed)
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 Fentanyl     Delirium  . Hydromorphone Hcl     Delirium  . Lorazepam     Delirium  . Oxycodone-Acetaminophen     Delirium    Patient Measurements: Height: 6\' 1"  (185.4 cm) Weight: 247 lb (112.038 kg) IBW/kg (Calculated) : 79.9   Vital Signs: Temp: 97.4 F (36.3 C) (05/24 0459) BP: 138/73 mmHg (05/24 0459) Pulse Rate: 68  (05/24 0459) Intake/Output from previous day: 05/23 0701 - 05/24 0700 In: 1080 [P.O.:1080] Out: 800 [Urine:800] Intake/Output from this shift: Total I/O In: 480 [P.O.:480] Out: 200 [Urine:200]  Labs:  [facility] 08/27/11 0625 08/26/11 0540 08/25/11 0550  WBC 7.3 6.4 6.4  HGB 7.8* 7.9* 7.9*  PLT 220 195 208  LABCREA -- -- --  CREATININE 1.36* 1.42* 1.75*   Estimated Creatinine Clearance: 64.4 ml/min (by C-G formula based on Cr of 1.36). No results found for this basename: VANCOTROUGH:2,VANCOPEAK:2,VANCORANDOM:2,GENTTROUGH:2,GENTPEAK:2,GENTRANDOM:2,TOBRATROUGH:2,TOBRAPEAK:2,TOBRARND:2,AMIKACINPEAK:2,AMIKACINTROU:2,AMIKACIN:2, in the last 72 hours   Microbiology: Recent Results (from the past 720 hour(s))  SURGICAL PCR SCREEN     Status: Normal   Collection Time   08/10/11 12:45 PM      Component Value Range Status Comment   MRSA, PCR NEGATIVE  NEGATIVE  Final    Staphylococcus aureus NEGATIVE  NEGATIVE  Final   BODY FLUID CULTURE     Status: Normal (Preliminary result)   Collection Time   08/24/11  3:33 PM      Component Value Range Status Comment   Specimen Description SYNOVIAL FLUID KNEE LEFT   Final    Special Requests SWAB RECIEVED NO 1   Final    Gram Stain PENDING   Incomplete    Culture NO GROWTH 1 DAY   Final    Report Status PENDING   Incomplete   ANAEROBIC CULTURE     Status: Normal (Preliminary result)   Collection Time   08/24/11  3:33 PM      Component Value Range Status Comment   Specimen Description SYNOVIAL FLUID KNEE LEFT   Final    Special Requests SWAB RECIEVED NO 1   Final    Gram Stain     Final    Value: ABUNDANT WBC PRESENT,BOTH PMN AND MONONUCLEAR     NO ORGANISMS SEEN     Performed at [facility]   Culture     Final    Value: NO ANAEROBES ISOLATED; CULTURE IN PROGRESS FOR 5 DAYS   Report Status PENDING   Incomplete   GRAM STAIN     Status: Normal   Collection Time   08/24/11  3:33 PM      Component Value Range Status Comment   Specimen Description SYNOVIAL FLUID KNEE LEFT   Final    Special Requests SWAB RECIEVED NO 1   Final    Gram Stain     Final    Value: ABUNDANT WBC PRESENT,BOTH PMN AND MONONUCLEAR     NO ORGANISMS SEEN   Report Status 08/24/2011 FINAL   Final   TISSUE CULTURE     Status: Normal   Collection Time   08/24/11  3:35 PM      Component Value Range Status Comment   Specimen Description TISSUE KNEE LEFT   Final    Special Requests NO 2   Final    Gram Stain  Final    Value: ABUNDANT WBC PRESENT,BOTH PMN AND MONONUCLEAR     NO ORGANISMS SEEN   Culture NO GROWTH 3 DAYS   Final    Report Status 08/27/2011 FINAL   Final   ANAEROBIC CULTURE     Status: Normal (Preliminary result)   Collection Time   08/24/11  3:35 PM      Component Value Range Status Comment   Specimen Description TISSUE KNEE LEFT   Final    Special Requests NO 2   Final    Gram Stain     Final    Value: ABUNDANT WBC PRESENT,BOTH PMN AND MONONUCLEAR     NO ORGANISMS SEEN     Performed at [facility]   Culture     Final    Value: NO ANAEROBES ISOLATED; CULTURE IN PROGRESS FOR 5 DAYS   Report Status PENDING   Incomplete   GRAM STAIN     Status: Normal   Collection Time   08/24/11  3:35 PM      Component Value Range Status Comment   Specimen Description TISSUE KNEE LEFT   Final    Special Requests NO 2   Final    Gram Stain     Final    Value: ABUNDANT WBC PRESENT,BOTH PMN AND  MONONUCLEAR     NO ORGANISMS SEEN   Report Status 08/24/2011 FINAL   Final     Anti-infectives     Start     Dose/Rate Route Frequency Ordered Stop   08/26/11 1700   cefTRIAXone (ROCEPHIN) 1 g in dextrose 5 % 50 mL IVPB        1 g 100 mL/hr over 30 Minutes Intravenous Every 24 hours 08/26/11 1626     08/26/11 0000   dextrose 5 % SOLN 50 mL with cefTRIAXone 1 G SOLR 1 g        1 g 100 mL/hr over 30 Minutes Intravenous Every 24 hours 08/26/11 1732     08/25/11 1400   vancomycin (VANCOCIN) 1,500 mg in sodium chloride 0.9 % 500 mL IVPB        1,500 mg 250 mL/hr over 120 Minutes Intravenous Every 24 hours 08/24/11 1905     08/24/11 1700   vancomycin (VANCOCIN) powder 2,000 mg  Status:  Discontinued        2,000 mg Other To Surgery 08/24/11 1650 08/24/11 2107   08/24/11 1700   tobramycin (NEBCIN) powder 2.4 g  Status:  Discontinued        2.4 g Topical To Surgery 08/24/11 1651 08/24/11 2319   08/24/11 1530   vancomycin (VANCOCIN) powder 4,000 mg  Status:  Discontinued        4,000 mg Other To Surgery 08/24/11 1523 08/24/11 2319   08/24/11 1520   tobramycin (NEBCIN) powder  Status:  Discontinued          As needed 08/24/11 1520 08/24/11 1808   08/24/11 1515   vancomycin (VANCOCIN) 1,500 mg in sodium chloride 0.9 % 500 mL IVPB  Status:  Discontinued        1,500 mg 250 mL/hr over 120 Minutes Intravenous To Surgery 08/24/11 1516 08/24/11 1904   08/24/11 1504   vancomycin (VANCOCIN) powder  Status:  Discontinued          As needed 08/24/11 1505 08/24/11 1808   08/24/11 1503   tobramycin (NEBCIN) injection  Status:  Discontinued          As needed 08/24/11 1504 08/24/11  1808   08/24/11 1430   vancomycin (VANCOCIN) powder 500 mg  Status:  Discontinued        500 mg Other To Surgery 08/24/11 1431 08/24/11 2319   08/24/11 1430   tobramycin (NEBCIN) injection 320 mg  Status:  Discontinued        320 mg Intramuscular To Surgery 08/24/11 1431 08/24/11 2319          Assessment: 71  y.o. M on Vancomycin + Rocephin for empiric coverage for osteomyelitis/septic arthritis now s/p resection of L TKA and placement of antibiotic spacer. The patient was started on Vancomycin on 5/21 and missed a dose on 5/22. SCr 1.36, CrCl~50-60 ml/min. It is unknown at this time if this dosing regimen is appropriate; Given missed dose on 5/22, the patient will not reach steady state until 5/26-5/27. Dose still appropriate for current renal function.   Goal of Therapy:  Vancomycin trough level 15-20 mcg/ml  Plan:  1. Continue Vancomycin 1500 mg every 24 hours 2. If discharged -- would recommend to check a trough level as an outpatient by Tues, 5/28 3. Will continue to follow renal function, culture results, LOT, and antibiotic de-escalation plans   Georgina Pillion, PharmD, BCPS Clinical Pharmacist Pager: [phone] 08/27/2011 9:50 AM

## 2011-08-27 NOTE — Discharge Summary (Signed)
ADDENDUM to D/C summary  Mr [name] will be discharged to SNF and not home.  [name] [name] [phone]  08/27/2011 7:26 PM

## 2011-08-27 NOTE — Progress Notes (Signed)
Clinical Social Worker received phone call from pt wife requesting update on bed offer from [facility] as pt plans to discharge today. Clinical Social Worker contacted [facility] and clarified facility questions in regard to discharge medications. [facility] notified this Clinical Social Worker that facility is able to offer a bed and bed available today. Clinical Social Worker notified pt wife. Clinical Social Worker met with pt at bedside and pt appeared more alert and oriented today. Clinical Social Worker discussed disposition planning and discussed concern about pt initially discharging home as he is needing extensive assist for ambulation and will require IV antibiotics through PICC line. Clinical Social Worker discussed that pt wife wanted to explore [facility] and facility is able to offer pt a bed for today. Pt stated that [facility] would be the only facility that he would be agreeable to going to and is agreeable to discharging to [facility] today. Clinical Social Worker notified pt wife and Charity fundraiser and facility. Clinical Social Worker to facilitate pt discharge needs.  Jacklynn Lewis, MSW, LCSWA  Clinical Social Work [phone]

## 2011-08-27 NOTE — Progress Notes (Signed)
Clinical Social Worker facilitated pt discharge needs including contacting facility, discussing with pt and pt family at bedside, discussing with RN and provided facility contact info for RN to call report, notified RNCM pt plan to discharge to SNF, and arranged ambulance transport for pt to [facility]. No further social work needs identified at this time.   Jacklynn Lewis, MSW, LCSWA  Clinical Social Work [phone]

## 2011-08-27 NOTE — Progress Notes (Signed)
[facility] PROGRESS NOTE  [name] ZOX:[id number] DOB: June 23, 1940 DOA: 08/24/2011 PCP: Ailene Ravel, MD, MD  Assessment/Plan: 1. DM 2 -- resumed lantus 5 units Adona  BID on 08/25/11 . Patient has stopped liraglutide 2 months ago. C/w ssi novolog. F/u cbgs 2. Hyperkalemia and acidosis - ? RTA type IV . DCed ARB on 08/25/11. Started oracit 08/25/11 - change to po bicarbonate at DC  . Kayexalate one dose on 08/25/11 . F/u bmet showed resolution of acidosis and stable creatinine . UO OK. Avoid nsaids 3. Infected TKR per ortho and ID  4. Depression on paxil 5. CAD , hx of CVA - to resume aspirin as soon as safe from ortho point of view    Active Problems:  DM (diabetes mellitus), type 2 with complications  HYPERCHOLESTEROLEMIA  IIA  ANEMIA, NORMOCYTIC, CHRONIC  DEPRESSION  HYPERTENSION, UNSPECIFIED  CAD, UNSPECIFIED SITE  Hyperkalemia  Infection of prosthetic left knee joint  CKD (chronic kidney disease) stage 3, GFR 30-59 ml/min  Morbid obesity  Hyponatremia  Staphylococcus aureus bacteremia  Code Status: full Family Communication:  Wife  Disposition Plan: ?   Lonia Blood, MD  [facility] Pager [phone]  If 7PM-7AM, please contact night-coverage [email or web address] Password [facility] 08/27/2011, 7:57 AM   LOS: 3 days   Brief narrative: 71 yo man admitted by ortho for removal of infected prosthesis  Consultants:  Ortho - Dr. Sheron Nightingale  ID -   Procedures: EXCISIONAL TOTAL KNEE ARTHROPLASTY WITH ANTIBIOTIC SPACERS (Left)    HPI/Subjective:  Ready to go home  Objective: Filed Vitals:   08/26/11 1300 08/26/11 2051 08/26/11 2123 08/27/11 0459  BP: 117/66 134/75 129/82 138/73  Pulse: 66 74 61 68  Temp: 97.6 F (36.4 C) 98.4 F (36.9 C) 97 F (36.1 C) 97.4 F (36.3 C)  TempSrc:      Resp: 18 18 18 18   Height:      Weight:      SpO2: 96% 96% 97% 96%    Intake/Output Summary (Last 24 hours) at 08/27/11 0757 Last data filed at 08/27/11  0500  Gross per 24 hour  Intake   1080 ml  Output    800 ml  Net    280 ml    Exam:   Alert and oriented  Cvs: rrr  Rs: ctab     Data Reviewed: Basic Metabolic Panel:  Lab 08/26/11 8295 08/25/11 0550 08/25/11 0007 08/24/11 1244  NA 134* 133* 132* 132*  K 4.7 4.8 -- --  CL 104 103 103 99  CO2 18* 17* 18* 21  GLUCOSE 163* 190* 176* 183*  BUN 20 24* 23 23  CREATININE 1.42* 1.75* 1.64* 1.69*  CALCIUM 9.7 9.3 9.3 10.1  MG -- -- -- --  PHOS -- -- -- --   Liver Function Tests: No results found for this basename: AST:5,ALT:5,ALKPHOS:5,BILITOT:5,PROT:5,ALBUMIN:5 in the last 168 hours No results found for this basename: LIPASE:5,AMYLASE:5 in the last 168 hours No results found for this basename: AMMONIA:5 in the last 168 hours CBC:  Lab 08/27/11 0625 08/26/11 0540 08/25/11 0550  WBC 7.3 6.4 6.4  NEUTROABS -- -- --  HGB 7.8* 7.9* 7.9*  HCT 23.7* 23.7* 24.2*  MCV 85.3 84.9 86.4  PLT 220 195 208   Cardiac Enzymes: No results found for this basename: CKTOTAL:5,CKMB:5,CKMBINDEX:5,TROPONINI:5 in the last 168 hours BNP (last 3 results)  Basename 06/15/11 0500  PROBNP 10540.0*   CBG:  Lab 08/27/11 6213 08/26/11 2127 08/26/11 1609 08/26/11 1105 08/26/11 0618  GLUCAP  159* 164* 148* 215* 156*    Recent Results (from the past 240 hour(s))  BODY FLUID CULTURE     Status: Normal (Preliminary result)   Collection Time   08/24/11  3:33 PM      Component Value Range Status Comment   Specimen Description SYNOVIAL FLUID KNEE LEFT   Final    Special Requests SWAB RECIEVED NO 1   Final    Gram Stain PENDING   Incomplete    Culture NO GROWTH 1 DAY   Final    Report Status PENDING   Incomplete   ANAEROBIC CULTURE     Status: Normal (Preliminary result)   Collection Time   08/24/11  3:33 PM      Component Value Range Status Comment   Specimen Description SYNOVIAL FLUID KNEE LEFT   Final    Special Requests SWAB RECIEVED NO 1   Final    Gram Stain     Final    Value: ABUNDANT  WBC PRESENT,BOTH PMN AND MONONUCLEAR     NO ORGANISMS SEEN     Performed at [facility]   Culture     Final    Value: NO ANAEROBES ISOLATED; CULTURE IN PROGRESS FOR 5 DAYS   Report Status PENDING   Incomplete   GRAM STAIN     Status: Normal   Collection Time   08/24/11  3:33 PM      Component Value Range Status Comment   Specimen Description SYNOVIAL FLUID KNEE LEFT   Final    Special Requests SWAB RECIEVED NO 1   Final    Gram Stain     Final    Value: ABUNDANT WBC PRESENT,BOTH PMN AND MONONUCLEAR     NO ORGANISMS SEEN   Report Status 08/24/2011 FINAL   Final   TISSUE CULTURE     Status: Normal   Collection Time   08/24/11  3:35 PM      Component Value Range Status Comment   Specimen Description TISSUE KNEE LEFT   Final    Special Requests NO 2   Final    Gram Stain     Final    Value: ABUNDANT WBC PRESENT,BOTH PMN AND MONONUCLEAR     NO ORGANISMS SEEN   Culture NO GROWTH 3 DAYS   Final    Report Status 08/27/2011 FINAL   Final   ANAEROBIC CULTURE     Status: Normal (Preliminary result)   Collection Time   08/24/11  3:35 PM      Component Value Range Status Comment   Specimen Description TISSUE KNEE LEFT   Final    Special Requests NO 2   Final    Gram Stain     Final    Value: ABUNDANT WBC PRESENT,BOTH PMN AND MONONUCLEAR     NO ORGANISMS SEEN     Performed at [facility]   Culture     Final    Value: NO ANAEROBES ISOLATED; CULTURE IN PROGRESS FOR 5 DAYS   Report Status PENDING   Incomplete   GRAM STAIN     Status: Normal   Collection Time   08/24/11  3:35 PM      Component Value Range Status Comment   Specimen Description TISSUE KNEE LEFT   Final    Special Requests NO 2   Final    Gram Stain     Final    Value: ABUNDANT WBC PRESENT,BOTH PMN AND MONONUCLEAR     NO ORGANISMS SEEN  Report Status 08/24/2011 FINAL   Final      Studies: No results found.  Scheduled Meds:    . atorvastatin  20 mg Oral QHS  . cefTRIAXone (ROCEPHIN)  IV  1 g  Intravenous Q24H  . diltiazem  240 mg Oral Daily  . docusate sodium  100 mg Oral BID  . ezetimibe  10 mg Oral QHS  . insulin aspart  0-15 Units Subcutaneous TID WC  . insulin aspart  0-5 Units Subcutaneous QHS  . insulin glargine  5 Units Subcutaneous BID  . metoprolol tartrate  25 mg Oral BID  . omega-3 acid ethyl esters  4 g Oral Daily  . pantoprazole  40 mg Oral Q1200  . PARoxetine  10 mg Oral QHS  . PARoxetine  20 mg Oral Daily  . rivaroxaban  10 mg Oral Daily  . sodium bicarbonate  650 mg Oral Once  . vancomycin  1,500 mg Intravenous Q24H  . DISCONTD: citric acid-sodium citrate  30 mL Oral QPC breakfast  . DISCONTD: citric acid-sodium citrate  30 mL Oral BID PC   Continuous Infusions:

## 2011-08-27 NOTE — Progress Notes (Addendum)
Patient ID: JAVARI BUFKIN, male   DOB: 02/15/1941, 71 y.o.   MRN: [id number] PATIENT ID: JAVON SNEE        MRN:  [id number]          DOB/AGE: 11-17-1940 / 71 y.o.  [name], MD   [name], PA-C [address] [place], [place], [place]  [address]                             [phone]   PROGRESS NOTE  Subjective:  negative for Chest Pain  negative for Shortness of Breath  negative for Nausea/Vomiting   negative for Calf Pain  negative for Bowel Movement   Tolerating Diet: yes         Patient reports pain as mild.     Doing well.  Pain much improved.  Wants to go home.  Objective: Vital signs in last 24 hours:   Patient Vitals for the past 24 hrs:  BP Temp Pulse Resp SpO2  08/27/11 0459 138/73 mmHg 97.4 F (36.3 C) 68  18  96 %  08/26/11 2123 129/82 mmHg 97 F (36.1 C) 61  18  97 %  08/26/11 2051 134/75 mmHg 98.4 F (36.9 C) 74  18  96 %  08/26/11 1300 117/66 mmHg 97.6 F (36.4 C) 66  18  96 %  08/26/11 1106 107/54 mmHg - - - -  08/26/11 1105 107/54 mmHg - - - -      Intake/Output from previous day:   05/23 0701 - 05/24 0700 In: 1080 [P.O.:1080] Out: 800 [Urine:800]   Intake/Output this shift:       Intake/Output      05/23 0701 - 05/24 0700 05/24 0701 - 05/25 0700   P.O. 1080    Blood     Total Intake(mL/kg) 1080 (9.6)    Urine (mL/kg/hr) 800 (0.3)    Drains     Total Output 800    Net +280            LABORATORY DATA:  Basename 08/27/11 0625 08/26/11 0540 08/25/11 0550  WBC 7.3 6.4 6.4  HGB 7.8* 7.9* 7.9*  HCT 23.7* 23.7* 24.2*  PLT 220 195 208   Hemoglobin stable.  Basename 08/26/11 0540 08/25/11 0550 08/25/11 0007 08/24/11 1244  NA 134* 133* 132* 132*  K 4.7 4.8 5.8* 5.6*  CL 104 103 103 99  CO2 18* 17* 18* 21  BUN 20 24* 23 23  CREATININE 1.42* 1.75* 1.64* 1.69*  GLUCOSE 163* 190* 176* 183*  CALCIUM 9.7 9.3 9.3 10.1   Lab Results  Component Value Date   INR 1.10 08/10/2011   INR 1.19 06/12/2011   INR 1.99* 01/28/2009     Examination:  General appearance: alert, cooperative and mild distress Resp: clear to auscultation bilaterally Cardio: regular rate and rhythm GI: normal findings: bowel sounds normal  Wound Exam: clean, dry, intact   Drainage:  None: wound tissue dry  Motor Exam: EHL, FHL, Anterior Tibial and Posterior Tibial Intact  Sensory Exam: Superficial Peroneal, Deep Peroneal and Tibial normal  Vascular Exam: Left posterior tibial artery has trace pulse  Assessment:    3 Days Post-Op  Procedure(s) (LRB): EXCISIONAL TOTAL KNEE ARTHROPLASTY WITH ANTIBIOTIC SPACERS (Left)  ADDITIONAL DIAGNOSIS:  Active Problems:  DM (diabetes mellitus), type 2 with complications  HYPERCHOLESTEROLEMIA  IIA  ANEMIA, NORMOCYTIC, CHRONIC  DEPRESSION  HYPERTENSION, UNSPECIFIED  CAD, UNSPECIFIED SITE  Hyperkalemia  Infection  of prosthetic left knee joint  CKD (chronic kidney disease) stage 3, GFR 30-59 ml/min  Morbid obesity  Hyponatremia  Staphylococcus aureus bacteremia  Acute Blood Loss Anemia, Hyponatremia and Diabetes   Plan: Physical Therapy as ordered Partial Weight Bearing @ 50% (PWB)  DVT Prophylaxis:  Xarelto, Foot Pumps and TED hose  DISCHARGE PLAN: SNF  DISCHARGE NEEDS: HHPT, HHRN, CPM, Walker, 3-in-1 comode seat and IV Antibiotics  Plan D/C to home.  F/U in 2 weeks.  Spoke to Dr Lavera Guise.  He will review all meds and update prior to his D/C this afternoon.       Giles Currie 08/27/2011, 7:57 AM

## 2011-08-27 NOTE — Progress Notes (Signed)
Discharge to [facility] facility. Patient discharged to SNF in stable condition. No complaints at time of discharge. Patient transported to facility by EMS. Reports called to Amy, LPN at [facility].

## 2011-08-28 LAB — BODY FLUID CULTURE: Culture: NO GROWTH

## 2011-08-29 LAB — ANAEROBIC CULTURE

## 2011-08-31 LAB — POCT I-STAT 4, (NA,K, GLUC, HGB,HCT)
Glucose, Bld: 169 mg/dL — ABNORMAL HIGH (ref 70–99)
HCT: 23 % — ABNORMAL LOW (ref 39.0–52.0)
Hemoglobin: 7.8 g/dL — ABNORMAL LOW (ref 13.0–17.0)

## 2011-09-21 ENCOUNTER — Telehealth: Payer: Self-pay | Admitting: *Deleted

## 2011-09-21 NOTE — Telephone Encounter (Signed)
He would be very unusual for him to be able to detect IV fluid in his central circulation. Please try to reassure her him that this is unlikely to cause any harm whatsoever. I will discuss this with him at his followup visit with me on June 24.

## 2011-09-21 NOTE — Telephone Encounter (Signed)
Wife states that her spouse c/o being able to feel the IV fluid going into his heart when IV is going. He first mentioned it last night & again this am. He denies pain, just an awareness. He wanted her to ask md if this is normal. She thinks he feels it because he was told it is in his heart.   I told her I will check with md & call her back when md is done with clinic.

## 2011-09-21 NOTE — Telephone Encounter (Signed)
i told her what md wrote & she stated she will try again to reassure him

## 2011-09-27 ENCOUNTER — Encounter: Payer: Self-pay | Admitting: Internal Medicine

## 2011-09-27 ENCOUNTER — Telehealth: Payer: Self-pay | Admitting: *Deleted

## 2011-09-27 ENCOUNTER — Ambulatory Visit (INDEPENDENT_AMBULATORY_CARE_PROVIDER_SITE_OTHER): Payer: Medicare Other | Admitting: Internal Medicine

## 2011-09-27 VITALS — BP 138/71 | HR 69 | Temp 98.7°F

## 2011-09-27 DIAGNOSIS — Z96659 Presence of unspecified artificial knee joint: Secondary | ICD-10-CM

## 2011-09-27 DIAGNOSIS — T8454XA Infection and inflammatory reaction due to internal left knee prosthesis, initial encounter: Secondary | ICD-10-CM

## 2011-09-27 DIAGNOSIS — T8450XA Infection and inflammatory reaction due to unspecified internal joint prosthesis, initial encounter: Secondary | ICD-10-CM

## 2011-09-27 LAB — C-REACTIVE PROTEIN: CRP: 1.45 mg/dL — ABNORMAL HIGH (ref <0.60)

## 2011-09-27 MED ORDER — VANCOMYCIN HCL 1000 MG IV SOLR: 1500.0000 mg | INTRAVENOUS | Status: AC

## 2011-09-27 MED ORDER — DEXTROSE 5 % IV SOLN: 1.0000 g | INTRAVENOUS | Status: AC

## 2011-09-27 NOTE — Progress Notes (Signed)
Patient ID: [name], male   DOB: 11/25/1940, 71 y.o.   MRN: [id number]    [facility]  Patient Active Problem List  Diagnosis  . DM (diabetes mellitus), type 2 with complications  . HYPERCHOLESTEROLEMIA  IIA  . GOUT  . ANEMIA, NORMOCYTIC, CHRONIC  . DEPRESSION  . HYPERTENSION, UNSPECIFIED  . CAD, UNSPECIFIED SITE  . COLITIS, ULCERATIVE NOS  . BENIGN PROSTATIC HYPERTROPHY  . SYNCOPE AND COLLAPSE  . URINARY INCONTINENCE  . HX, PERSONAL, MALIGNANCY, RECTUM/ANUS  . Hyperkalemia  . Normocytic anemia  . H/O ETOH abuse  . Infection of prosthetic left knee joint  . CKD (chronic kidney disease) stage 3, GFR 30-59 ml/min  . Morbid obesity  . Hyponatremia  . Staphylococcus aureus bacteremia  . Atrial flutter, paroxysmal    Patient's Medications  New Prescriptions   No medications on file  Previous Medications   DILTIAZEM (CARDIZEM CD) 240 MG 24 HR CAPSULE    Take 240 mg by mouth daily.   EZETIMIBE-SIMVASTATIN (VYTORIN) 10-40 MG PER TABLET    Take 1 tablet by mouth at bedtime.   HYDROCODONE-ACETAMINOPHEN (NORCO) 5-325 MG PER TABLET    Take 1-2 tablets by mouth every 4 (four) hours as needed. pain   INSULIN ASPART (NOVOLOG) 100 UNIT/ML INJECTION    Inject into the skin 3 (three) times daily before meals. 16units am, 20 units noon, 14 units pm   INSULIN GLARGINE (LANTUS) 100 UNIT/ML INJECTION    Inject 10 Units into the skin at bedtime.   LEVALBUTEROL (XOPENEX) 0.63 MG/3ML NEBULIZER SOLUTION    Take 0.63 mg by nebulization every 6 (six) hours as needed. Shortness of breath   LORAZEPAM (ATIVAN) 0.5 MG TABLET    Take 0.5 mg by mouth every 8 (eight) hours as needed.   METOPROLOL TARTRATE (LOPRESSOR) 25 MG TABLET    Take 25 mg by mouth 2 (two) times daily.   MULTIPLE VITAMIN (MULTIVITAMIN) TABLET    Take 1 tablet by mouth daily.     OMEGA-3 ACID ETHYL ESTERS (LOVAZA) 1 G CAPSULE    Take 4 g by mouth daily.   PANTOPRAZOLE (PROTONIX) 40 MG TABLET    Take 40 mg  by mouth daily at 12 noon.   PAROXETINE (PAXIL) 20 MG TABLET    Take 10-20 mg by mouth 2 (two) times daily. 1 tab in am, 0.5 tab in pm   RIVAROXABAN (XARELTO) 10 MG TABS TABLET    Take 1 tablet (10 mg total) by mouth daily.   SODIUM BICARBONATE 650 MG TABLET    Take 1 tablet (650 mg total) by mouth daily.   TRAZODONE (DESYREL) 25 MG TABS    Take 25 mg by mouth at bedtime as needed. sleep  Modified Medications   Modified Medication Previous Medication   DEXTROSE 5 % SOLN 50 ML WITH CEFTRIAXONE 1 G SOLR 1 G dextrose 5 % SOLN 50 mL with cefTRIAXone 1 G SOLR 1 g      Inject 1 g into the vein daily.    Inject 1 g into the vein daily.   SODIUM CHLORIDE 0.9 % SOLN 500 ML WITH VANCOMYCIN 1000 MG SOLR 1,500 MG sodium chloride 0.9 % SOLN 500 mL with vancomycin 1000 MG SOLR 1,500 mg      Inject 1,500 mg into the vein daily.    Inject 1,500 mg into the vein daily.  Discontinued Medications   No medications on file    Subjective: Mr. Bosher is in for  his routine visit. He underwent excision arthroplasty with spacer placement for his MSSA left prosthetic knee infection on May 2001. Operative cultures were negative. He was discharged to collapse the nursing home on IV vancomycin and ceftriaxone. He has now been back home for about 3 weeks. He has had no problems tolerating his PICC or antibiotics. He is feeling much better with much less pain in his knee. He has now completed 5 weeks of postoperative antibiotics.  Objective: Temp: 98.7 F (37.1 C) (06/24 1345) Temp src: Oral (06/24 1345) BP: 138/71 mmHg (06/24 1345) Pulse Rate: 69  (06/24 1345)  General: He is joking and in good spirits his usual Skin: Right arm PICC site appears normal His left knee incision is healing nicely without any evidence of persistent infection   Lab Results  Component Value Date   CRP 4.24* 08/25/2011    Lab Results  Component Value Date   ESRSEDRATE 76* 08/25/2011    Assessment: I suspect he is nearing sure for his  prosthetic joint infection. I will repeat his inflammatory markers and plan 1 one more week of antibiotic therapy.  Plan: 1. Continue vancomycin and ceftriaxone for one more week 2. Repeat sedimentation rate and C-reactive protein 3. Followup in 6 weeks   Cliffton Asters, MD [facility] [phone] pager   [phone] cell 09/27/2011, 2:03 PM

## 2011-09-27 NOTE — Telephone Encounter (Signed)
[facility] at [phone] to advised Per Dr Orvan Falconer to D/C patient Picc and IV Antibiotics on October 04, 2011. The rep advised that they will stop the IV vancomycin and celftraixone on 10/04/11 and pull the PICC on 10/05/11. Advised the provider and the patient of this information.

## 2011-09-28 LAB — SEDIMENTATION RATE: Sed Rate: 42 mm/hr — ABNORMAL HIGH (ref 0–16)

## 2011-10-12 ENCOUNTER — Encounter (HOSPITAL_COMMUNITY): Payer: Self-pay | Admitting: Pharmacy Technician

## 2011-10-17 NOTE — H&P (Signed)
COMPLAINT:     Painful left knee.  HPI:     The patient is a very pleasant 71 year old white male who is seen today for follow-up evaluation of his left knee.  On 06/12/11 he presented to the [facility] with significant pain and discomfort in his left knee.  He had been hospitalized from 06/04/11 through 06/08/11 with chest pain and bronchitis.  The chest pain was thought to be that of bronchitis.  He was placed on steroids and discharged with a steroid taper on 06/08/11.  Subsequently after that discharge on 06/09/11 the patient had developed left knee pain.  He reported that his knee pain had been increasing and he went to see his primary care physician on 06/11/11 and had his left knee tapped.  He had increasing pain in the left knee and also the ankle during the night of 06/11/11 and actually started having right knee pain.  He presented to the hospital on 06/12/11 and was noted to have significant pain, discomfort, and swelling in the knee.  He was afebrile at the time.  However, because of his swelling it was felt that he did have an aspiration in the hospital which revealed intracellular bacteria and Dr. Madelon Lips took him to the operating room on 06/12/11 where he performed incision, drainage, irrigation, and debridement of the left knee and a poly exchange.  He was admitted to the hospital at that time and was placed on antibiotics.  He was noted to have methicillin sensitive staph aureus in his blood as well as in his left knee.  His right knee was clean.  During his hospital stay we did do several aspirations of fluid from the knee and the subcutaneous areas.  An I&D locally at the distal portion of the wound was performed and this was packed up until about two days ago.  Since that time he has had antibiotics and has been followed by Dr. Bonnita Nasuti.  He has had improvement in his pain, but he still continues to have significant swelling and pain in the knee.    He continued to have problems  and it was felt that removal of the left TKR was appropriate and this was completed on 08/24/2011 with placement of an antibiotic spacer.  Has been treated with IV antibiotics (vancomycin and ceftriaxone) and followed by ID.  Now has had marked improvement and would like to be considered for removal of antibiotic spacer and revision TKR.   MEDICATIONS:  Aspirin 81 mg daily, cinnamon 1,000 mg daily, Cardizem 240 mg daily, Vytorin 10/40 h.s., Norco 5/325 1-2 tabs q. 4h. p.r.n. pain, lorazepam 0.5 mg 1 tablet q. 8h. for anxiety as needed, Xopenex 0.6 mg/3 mL, nebulizer q. 6h. p.r.n. wheezing and shortness of breath, metoprolol (Lopressor) 25 mg 1 tablet b.i.d., multivitamin, omega-3 ethyl esters (Lovaza) 4 grams daily.  He is also on Paxil 20 mg tablets that he can take 10 to 20 mg by mouth two times a day, one in the morning and one-half milligram in the evening, Protonix 40 mg at noon, Losartan 100 mg daily, and trazodone 25 mg -tablet h.s.  He is also on Victoza 18/3 mL 1.2 solution daily, NovoLog 100 units/mL 16/20/14 and Lantus 48 units h.s.  ALLERGIES:  Dilaudid, Ativan, and Fentanyl all cause delirium.   PAST MEDICAL HISTORY:  His health is fair.  PAST SURGICAL HISTORY:   Angioplasty 1992 and abdominal aortic aneurysm1992.  he had bladder tumor excised in 2001 with recurrence in 2002 and  had chemo wash of the bladder and apparently has not had a recurrence.  In 2001 he did have an arthroscopy of the knee.  In 2005 he had open reduction internal fixation of a compound ankle fracture.  In 2007 he had colon cancer and had colectomy with placement of colostomy.  He also had his prostate removed in 2008 by TUR.  Knee replacement was performed on the left knee in 2009.  In 2013 as noted in the history he developed congestion in lungs and bronchitis and possible pleurisy.    HOSPITALIZATIONS:  He was hospitalized in 1993 with numbness over the entire body and had to have medication adjustment.  ROS:      Fourteen point review of systems is negative except for history of GERD treated with Protonix.  He was hospitalized this year for bronchitis and pleurisy and developed chest pain and shortness of breath.  Apparently he has had a light "stroke" with minimal residual affect.  He has been diabetic for 7 to 8 years and he is now insulin dependent.  He has had bladder and colon cancer.  He has had renal calculi 11 times before the age of 55 and they were calcium oxalate crystals.  Apparently he states that this was secondary to drinking too much milk.  He does have occasional dizziness which he feels is on the basis of working with his infection at the present time.   FAMILY HISTORY:  Positive for mother who died at 4 years of age, father who died at age 107 from ruptured appendix.  He has two living brothers ages 48 and 12.  He has three deceased brothers who died in their 20s.  He has five sisters not living.  SOCIAL HISTORY:   He is a 71 year old white married male.  He is retired from [employer].  He quit smoking cigarettes 40 years ago after smoking 2 PPD x 30 years. He does not drink alcohol.  EXAM:     Examination today reveals a 71 year old white male who is well developed, well nourished, alert, pleasant, and cooperative.  He is in moderate distress secondary to left knee pain.    Height 6 feet, 0 inches.  Weight 254 pounds.  BMI 34.4.   Temperature 98.4 degrees.  Respirations 18.  Pulse 80.  Blood pressure 126/80.  Head:  Normocephalic. ENT:  Benign. Neck:  Supple.  No bruits noted. Chest:  Good expansion. Lungs:  Clear to auscultation bilaterally. Cardiac:  Regular rhythm, rate.  No murmurs noted. Abdomen:  Obese, soft, nontender, no masses palpable, normal bowel sounds present.  Ostomy bag noted. CNS:  Oriented x 3.  Cranial nerves 2-12 grossly intact. Skin:  Intact over the left knee.  There is an eschar at the inferior aspect of the knee wound. Genital, rectal, breast:   Not indicated for orthopedic evaluation. Musculoskeletal:  He has range of motion today from about 3 to 4 degrees to 100 degrees.  He has an effusion of the knee noted.  He is ligamentously stable at this time.  He is not particularly warm today.  There is no erythema.  He is neurovascularly intact distally.   IMPRESSION:      1. 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 2. Recent history of MSSA sepsis with septic arthritis of the left knee. 3. Insulin-dependent diabetes mellitus. 4. Possible CVA. 5. Recent history of bronchitis. 6. Bladder carcinoma. 7. 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 8. Renal calculi.  RECOMMENDATIONS:  1. At this time, we feel that after discussing with Dr. Orvan Falconer, the infectious disease doctor, that the patient is a candidate for arthrotomy with removal of the antibiotic spacer and revision total joint replacement. 2. Procedure, risks and benefits explained and he is understanding and wants to proceed. 3. We will send him for blood work and plan for revision L TKR if lab studies are stable.    Oris Drone Aleda Grana [facility] [phone]  10/25/2011 6:21 PM

## 2011-10-20 ENCOUNTER — Encounter (HOSPITAL_COMMUNITY): Payer: Self-pay

## 2011-10-20 ENCOUNTER — Encounter (HOSPITAL_COMMUNITY)
Admission: RE | Admit: 2011-10-20 | Discharge: 2011-10-20 | Disposition: A | Discharge status: Home / Self Care | Payer: Medicare Other | Source: Ambulatory Visit | Attending: Orthopedic Surgery | Admitting: Orthopedic Surgery

## 2011-10-20 ENCOUNTER — Encounter (HOSPITAL_COMMUNITY)
Admission: RE | Admit: 2011-10-20 | Discharge: 2011-10-20 | Disposition: A | Discharge status: Home / Self Care | Payer: Medicare Other | Source: Ambulatory Visit | Attending: Orthopaedic Surgery | Admitting: Orthopaedic Surgery

## 2011-10-20 LAB — URINE MICROSCOPIC-ADD ON

## 2011-10-20 LAB — URINALYSIS, ROUTINE W REFLEX MICROSCOPIC
Glucose, UA: 250 mg/dL — AB
Leukocytes, UA: NEGATIVE
Nitrite: NEGATIVE
Specific Gravity, Urine: 1.019 (ref 1.005–1.030)
pH: 5 (ref 5.0–8.0)

## 2011-10-20 LAB — COMPREHENSIVE METABOLIC PANEL
AST: 21 U/L (ref 0–37)
Albumin: 3.8 g/dL (ref 3.5–5.2)
Chloride: 104 mEq/L (ref 96–112)
Creatinine, Ser: 1.68 mg/dL — ABNORMAL HIGH (ref 0.50–1.35)
Total Bilirubin: 0.3 mg/dL (ref 0.3–1.2)
Total Protein: 7.7 g/dL (ref 6.0–8.3)

## 2011-10-20 LAB — DIFFERENTIAL
Basophils Relative: 0 % (ref 0–1)
Lymphocytes Relative: 30 % (ref 12–46)
Lymphs Abs: 2.1 10*3/uL (ref 0.7–4.0)
Monocytes Absolute: 0.5 10*3/uL (ref 0.1–1.0)
Monocytes Relative: 8 % (ref 3–12)
Neutro Abs: 3.8 10*3/uL (ref 1.7–7.7)
Neutrophils Relative %: 55 % (ref 43–77)

## 2011-10-20 LAB — CBC
MCHC: 33.4 g/dL (ref 30.0–36.0)
MCV: 84 fL (ref 78.0–100.0)
Platelets: 168 10*3/uL (ref 150–400)
RDW: 15.8 % — ABNORMAL HIGH (ref 11.5–15.5)
WBC: 6.9 10*3/uL (ref 4.0–10.5)

## 2011-10-20 LAB — PROTIME-INR: INR: 1.04 (ref 0.00–1.49)

## 2011-10-20 LAB — APTT: aPTT: 28 seconds (ref 24–37)

## 2011-10-20 NOTE — Pre-Procedure Instructions (Signed)
20 EMRE STOCK  10/20/2011   Your procedure is scheduled on:  October 26, 2011 at 0730 AM  Report to [facility] at 0530 AM.  Call this number if you have problems the morning of surgery: [phone]   Remember:   Do not eat food or drink:After Midnight.      Take these medicines the morning of surgery with A SIP OF WATER: Diltiazen-Dilacor XR, Norco if needed, Lopressor- Metoprolol, Protonix,and Paxil. No diabetic meds.   Do not wear jewelry.  Do not wear lotions,or  powders.. You may wear deodorant.  Do not shave 48 hours prior to surgery. Men may shave face and neck.  Do not bring valuables to the hospital.  Contacts, dentures or bridgework may not be worn into surgery.  Leave suitcase in the car. After surgery it may be brought to your room.  For patients admitted to the hospital, checkout time is 11:00 AM the day of discharge.   Patients discharged the day of surgery will not be allowed to drive home.    Special Instructions: Incentive Spirometry - Practice and bring it with you on the day of surgery. and CHG Shower Use Special Wash: 1/2 bottle night before surgery and 1/2 bottle morning of surgery.   Please read over the following fact sheets that you were given: Pain Booklet, Coughing and Deep Breathing, Blood Transfusion Information, Total Joint Packet, MRSA Information and Surgical Site Infection Prevention

## 2011-10-21 LAB — URINE CULTURE: Colony Count: NO GROWTH

## 2011-10-21 MED ORDER — LIDOCAINE-EPINEPHRINE 1 %-1:100000 IJ SOLN
INTRAMUSCULAR | Status: AC
  Filled 2011-10-21: qty 1

## 2011-10-21 NOTE — Consult Note (Signed)
Anesthesia Chart Review:  Patient is a 71 year old male scheduled for revision of left total knee replacement on 10/26/11 by Dr. Cleophas Dunker.  Please see my note (from Encounter date 08/24/11) prior to his last surgery in May 2013.    He had a new EKG done on 10/20/11 that showed NSR with non-specific T wave abnormality; otherwise he has not had any additional cardiac testing sine my last note.  CXR on 10/20/11 showed no active lung disease. Stable hyperaeration and probable chronic bronchitis.   Labs noted.  His K is 6.1, BUN 29, Cr 1.68, glucose 269.  His Cr has ranged from ~ 1.4 - 2.1 with average being ~ 1.6 since April 2013. His Cr peaked at 5.06 on 06/15/11 during his admission for MSSA left prosthetic knee infection with bacteremia.  H/H 11.2/33.5, PLT 168.  Urine culture showed no growth.  I called this result to Keri at Dr. Hoy Register office. Will order a BMET for the day of surgery.  He is also scheduled for a T&S at that time.  Shonna Chock, PA-C

## 2011-10-26 ENCOUNTER — Inpatient Hospital Stay (HOSPITAL_COMMUNITY)
Admission: RE | Admit: 2011-10-26 | Discharge: 2011-10-29 | DRG: 467 | Disposition: A | Discharge status: Skilled Nursing Facility | Payer: Medicare Other | Source: Ambulatory Visit | Attending: Orthopaedic Surgery | Admitting: Orthopaedic Surgery

## 2011-10-26 ENCOUNTER — Encounter (HOSPITAL_COMMUNITY): Payer: Self-pay | Admitting: Vascular Surgery

## 2011-10-26 ENCOUNTER — Encounter (HOSPITAL_COMMUNITY): Payer: Self-pay | Admitting: General Practice

## 2011-10-26 ENCOUNTER — Encounter (HOSPITAL_COMMUNITY)
Admission: RE | Disposition: A | Discharge status: Skilled Nursing Facility | Payer: Self-pay | Source: Ambulatory Visit | Attending: Orthopaedic Surgery

## 2011-10-26 ENCOUNTER — Ambulatory Visit (HOSPITAL_COMMUNITY): Payer: Medicare Other | Admitting: Vascular Surgery

## 2011-10-26 DIAGNOSIS — I129 Hypertensive chronic kidney disease with stage 1 through stage 4 chronic kidney disease, or unspecified chronic kidney disease: Secondary | ICD-10-CM | POA: Diagnosis present

## 2011-10-26 DIAGNOSIS — M109 Gout, unspecified: Secondary | ICD-10-CM | POA: Diagnosis present

## 2011-10-26 DIAGNOSIS — Z01818 Encounter for other preprocedural examination: Secondary | ICD-10-CM

## 2011-10-26 DIAGNOSIS — K219 Gastro-esophageal reflux disease without esophagitis: Secondary | ICD-10-CM | POA: Diagnosis present

## 2011-10-26 DIAGNOSIS — K5190 Ulcerative colitis, unspecified, without complications: Secondary | ICD-10-CM | POA: Diagnosis present

## 2011-10-26 DIAGNOSIS — E875 Hyperkalemia: Secondary | ICD-10-CM | POA: Diagnosis present

## 2011-10-26 DIAGNOSIS — I4891 Unspecified atrial fibrillation: Secondary | ICD-10-CM | POA: Diagnosis present

## 2011-10-26 DIAGNOSIS — N179 Acute kidney failure, unspecified: Secondary | ICD-10-CM | POA: Diagnosis present

## 2011-10-26 DIAGNOSIS — N183 Chronic kidney disease, stage 3 unspecified: Secondary | ICD-10-CM | POA: Diagnosis present

## 2011-10-26 DIAGNOSIS — D649 Anemia, unspecified: Secondary | ICD-10-CM | POA: Diagnosis present

## 2011-10-26 DIAGNOSIS — M21869 Other specified acquired deformities of unspecified lower leg: Principal | ICD-10-CM | POA: Diagnosis present

## 2011-10-26 DIAGNOSIS — I10 Essential (primary) hypertension: Secondary | ICD-10-CM | POA: Diagnosis present

## 2011-10-26 DIAGNOSIS — Z8551 Personal history of malignant neoplasm of bladder: Secondary | ICD-10-CM

## 2011-10-26 DIAGNOSIS — F329 Major depressive disorder, single episode, unspecified: Secondary | ICD-10-CM | POA: Diagnosis present

## 2011-10-26 DIAGNOSIS — Z794 Long term (current) use of insulin: Secondary | ICD-10-CM

## 2011-10-26 DIAGNOSIS — I2510 Atherosclerotic heart disease of native coronary artery without angina pectoris: Secondary | ICD-10-CM

## 2011-10-26 DIAGNOSIS — R7881 Bacteremia: Secondary | ICD-10-CM | POA: Diagnosis present

## 2011-10-26 DIAGNOSIS — Z933 Colostomy status: Secondary | ICD-10-CM

## 2011-10-26 DIAGNOSIS — E119 Type 2 diabetes mellitus without complications: Secondary | ICD-10-CM | POA: Diagnosis present

## 2011-10-26 DIAGNOSIS — Z96659 Presence of unspecified artificial knee joint: Secondary | ICD-10-CM

## 2011-10-26 DIAGNOSIS — B9561 Methicillin susceptible Staphylococcus aureus infection as the cause of diseases classified elsewhere: Secondary | ICD-10-CM | POA: Diagnosis present

## 2011-10-26 DIAGNOSIS — Z01812 Encounter for preprocedural laboratory examination: Secondary | ICD-10-CM

## 2011-10-26 DIAGNOSIS — I4892 Unspecified atrial flutter: Secondary | ICD-10-CM

## 2011-10-26 DIAGNOSIS — E7800 Pure hypercholesterolemia, unspecified: Secondary | ICD-10-CM | POA: Diagnosis present

## 2011-10-26 DIAGNOSIS — E118 Type 2 diabetes mellitus with unspecified complications: Secondary | ICD-10-CM

## 2011-10-26 DIAGNOSIS — Z85038 Personal history of other malignant neoplasm of large intestine: Secondary | ICD-10-CM

## 2011-10-26 DIAGNOSIS — Z87891 Personal history of nicotine dependence: Secondary | ICD-10-CM

## 2011-10-26 DIAGNOSIS — Z8673 Personal history of transient ischemic attack (TIA), and cerebral infarction without residual deficits: Secondary | ICD-10-CM

## 2011-10-26 DIAGNOSIS — Z7982 Long term (current) use of aspirin: Secondary | ICD-10-CM

## 2011-10-26 DIAGNOSIS — T8454XA Infection and inflammatory reaction due to internal left knee prosthesis, initial encounter: Secondary | ICD-10-CM

## 2011-10-26 DIAGNOSIS — F3289 Other specified depressive episodes: Secondary | ICD-10-CM | POA: Diagnosis present

## 2011-10-26 DIAGNOSIS — D62 Acute posthemorrhagic anemia: Secondary | ICD-10-CM | POA: Diagnosis not present

## 2011-10-26 DIAGNOSIS — Z01810 Encounter for preprocedural cardiovascular examination: Secondary | ICD-10-CM

## 2011-10-26 DIAGNOSIS — Z01811 Encounter for preprocedural respiratory examination: Secondary | ICD-10-CM

## 2011-10-26 DIAGNOSIS — Z79899 Other long term (current) drug therapy: Secondary | ICD-10-CM

## 2011-10-26 HISTORY — DX: Unspecified chronic bronchitis: J42

## 2011-10-26 HISTORY — PX: TOTAL KNEE REVISION: SHX996

## 2011-10-26 HISTORY — DX: Malignant neoplasm of colon, unspecified: C18.9

## 2011-10-26 LAB — BASIC METABOLIC PANEL
CO2: 22 mEq/L (ref 19–32)
Chloride: 104 mEq/L (ref 96–112)
Creatinine, Ser: 1.64 mg/dL — ABNORMAL HIGH (ref 0.50–1.35)
GFR calc Af Amer: 46 mL/min — ABNORMAL LOW (ref >90)
Sodium: 137 mEq/L (ref 135–145)

## 2011-10-26 LAB — GLUCOSE, CAPILLARY
Glucose-Capillary: 202 mg/dL — ABNORMAL HIGH (ref 70–99)
Glucose-Capillary: 222 mg/dL — ABNORMAL HIGH (ref 70–99)

## 2011-10-26 SURGERY — TOTAL KNEE REVISION
Anesthesia: Regional | Site: Knee | Laterality: Left | Wound class: Clean

## 2011-10-26 MED ORDER — MAGNESIUM HYDROXIDE 400 MG/5ML PO SUSP: 30.0000 mL | Freq: Every day | ORAL | Status: DC | PRN

## 2011-10-26 MED ORDER — KETOROLAC TROMETHAMINE 15 MG/ML IJ SOLN: 7.5000 mg | Freq: Four times a day (QID) | INTRAMUSCULAR | Status: DC

## 2011-10-26 MED ORDER — ONDANSETRON HCL 4 MG/2ML IJ SOLN: 4.0000 mg | Freq: Four times a day (QID) | INTRAMUSCULAR | Status: DC | PRN

## 2011-10-26 MED ORDER — EPHEDRINE SULFATE 50 MG/ML IJ SOLN
INTRAMUSCULAR | Status: DC | PRN
  Administered 2011-10-26: 20 mg via INTRAVENOUS

## 2011-10-26 MED ORDER — BISACODYL 10 MG RE SUPP: 10.0000 mg | Freq: Every day | RECTAL | Status: DC | PRN

## 2011-10-26 MED ORDER — METOPROLOL TARTRATE 25 MG PO TABS
25.0000 mg | ORAL_TABLET | Freq: Two times a day (BID) | ORAL | Status: DC
  Administered 2011-10-27 – 2011-10-29 (×4): 25 mg via ORAL
  Filled 2011-10-26 (×8): qty 1

## 2011-10-26 MED ORDER — PAROXETINE HCL 20 MG PO TABS
20.0000 mg | ORAL_TABLET | Freq: Every day | ORAL | Status: DC
  Administered 2011-10-27 – 2011-10-29 (×3): 20 mg via ORAL
  Filled 2011-10-26 (×3): qty 1

## 2011-10-26 MED ORDER — INSULIN ASPART 100 UNIT/ML ~~LOC~~ SOLN: 0.0000 [IU] | Freq: Every day | SUBCUTANEOUS | Status: DC

## 2011-10-26 MED ORDER — RIVAROXABAN 10 MG PO TABS
10.0000 mg | ORAL_TABLET | Freq: Every day | ORAL | Status: DC
  Administered 2011-10-27 – 2011-10-28 (×3): 10 mg via ORAL
  Filled 2011-10-26 (×5): qty 1

## 2011-10-26 MED ORDER — INSULIN GLARGINE 100 UNIT/ML ~~LOC~~ SOLN
10.0000 [IU] | Freq: Every day | SUBCUTANEOUS | Status: DC
Start: 2011-10-26 — End: 2011-10-26

## 2011-10-26 MED ORDER — PAROXETINE HCL 10 MG PO TABS: 10.0000 mg | ORAL_TABLET | Freq: Two times a day (BID) | ORAL | Status: DC

## 2011-10-26 MED ORDER — DOCUSATE SODIUM 100 MG PO CAPS
100.0000 mg | ORAL_CAPSULE | Freq: Two times a day (BID) | ORAL | Status: DC
  Administered 2011-10-26 – 2011-10-29 (×7): 100 mg via ORAL
  Filled 2011-10-26 (×8): qty 1

## 2011-10-26 MED ORDER — SODIUM CHLORIDE 0.9 % IV SOLN: INTRAVENOUS | Status: DC

## 2011-10-26 MED ORDER — FLEET ENEMA 7-19 GM/118ML RE ENEM: 1.0000 | ENEMA | Freq: Once | RECTAL | Status: AC | PRN

## 2011-10-26 MED ORDER — LIRAGLUTIDE 18 MG/3ML ~~LOC~~ SOLN
1.2000 mg | Freq: Every morning | SUBCUTANEOUS | Status: DC
  Administered 2011-10-29: 1.2 mg via SUBCUTANEOUS

## 2011-10-26 MED ORDER — ACETAMINOPHEN 10 MG/ML IV SOLN
INTRAVENOUS | Status: DC | PRN
  Administered 2011-10-26: 1000 mg via INTRAVENOUS

## 2011-10-26 MED ORDER — ONDANSETRON HCL 4 MG/2ML IJ SOLN
INTRAMUSCULAR | Status: DC | PRN
  Administered 2011-10-26: 4 mg via INTRAVENOUS

## 2011-10-26 MED ORDER — ONDANSETRON HCL 4 MG PO TABS: 4.0000 mg | ORAL_TABLET | Freq: Four times a day (QID) | ORAL | Status: DC | PRN

## 2011-10-26 MED ORDER — MIDAZOLAM HCL 5 MG/5ML IJ SOLN
INTRAMUSCULAR | Status: DC | PRN
  Administered 2011-10-26: 2 mg via INTRAVENOUS

## 2011-10-26 MED ORDER — DROPERIDOL 2.5 MG/ML IJ SOLN: 0.6250 mg | INTRAMUSCULAR | Status: DC | PRN

## 2011-10-26 MED ORDER — VANCOMYCIN HCL IN DEXTROSE 1-5 GM/200ML-% IV SOLN
INTRAVENOUS | Status: AC
  Filled 2011-10-26: qty 200

## 2011-10-26 MED ORDER — MORPHINE SULFATE 2 MG/ML IJ SOLN
1.0000 mg | INTRAMUSCULAR | Status: DC | PRN
  Administered 2011-10-26: 2 mg via INTRAVENOUS

## 2011-10-26 MED ORDER — ALUM & MAG HYDROXIDE-SIMETH 200-200-20 MG/5ML PO SUSP
30.0000 mL | ORAL | Status: DC | PRN
  Filled 2011-10-26: qty 30

## 2011-10-26 MED ORDER — METOCLOPRAMIDE HCL 5 MG/ML IJ SOLN: 5.0000 mg | Freq: Three times a day (TID) | INTRAMUSCULAR | Status: DC | PRN

## 2011-10-26 MED ORDER — ATORVASTATIN CALCIUM 40 MG PO TABS
40.0000 mg | ORAL_TABLET | Freq: Every day | ORAL | Status: DC
  Administered 2011-10-26 – 2011-10-27 (×2): 40 mg via ORAL
  Filled 2011-10-26 (×4): qty 1

## 2011-10-26 MED ORDER — BUPIVACAINE-EPINEPHRINE PF 0.25-1:200000 % IJ SOLN
INTRAMUSCULAR | Status: DC | PRN
  Administered 2011-10-26: 30 mL

## 2011-10-26 MED ORDER — METOCLOPRAMIDE HCL 5 MG PO TABS
5.0000 mg | ORAL_TABLET | Freq: Three times a day (TID) | ORAL | Status: DC | PRN
  Filled 2011-10-26: qty 2

## 2011-10-26 MED ORDER — METHOCARBAMOL 100 MG/ML IJ SOLN
500.0000 mg | Freq: Four times a day (QID) | INTRAVENOUS | Status: DC | PRN
  Administered 2011-10-26: 500 mg via INTRAVENOUS
  Filled 2011-10-26: qty 5

## 2011-10-26 MED ORDER — OXYCODONE HCL 5 MG PO TABS
5.0000 mg | ORAL_TABLET | ORAL | Status: DC | PRN
  Administered 2011-10-26 – 2011-10-27 (×2): 10 mg via ORAL
  Administered 2011-10-29 (×2): 5 mg via ORAL
  Filled 2011-10-26 (×2): qty 2
  Filled 2011-10-26 (×2): qty 1
  Filled 2011-10-26: qty 2

## 2011-10-26 MED ORDER — PANTOPRAZOLE SODIUM 40 MG PO TBEC
40.0000 mg | DELAYED_RELEASE_TABLET | Freq: Every day | ORAL | Status: DC
  Administered 2011-10-26 – 2011-10-29 (×4): 40 mg via ORAL
  Filled 2011-10-26 (×4): qty 1

## 2011-10-26 MED ORDER — VANCOMYCIN HCL 1000 MG IV SOLR
3000.0000 mg | INTRAVENOUS | Status: AC
  Administered 2011-10-26: 3000 mg
  Filled 2011-10-26: qty 3000

## 2011-10-26 MED ORDER — OMEGA-3-ACID ETHYL ESTERS 1 G PO CAPS
4.0000 g | ORAL_CAPSULE | Freq: Every day | ORAL | Status: DC
  Administered 2011-10-27 – 2011-10-29 (×4): 4 g via ORAL
  Filled 2011-10-26 (×5): qty 4

## 2011-10-26 MED ORDER — SODIUM CHLORIDE 0.9 % IR SOLN
Status: DC | PRN
  Administered 2011-10-26: 4000 mL

## 2011-10-26 MED ORDER — BUPIVACAINE-EPINEPHRINE PF 0.5-1:200000 % IJ SOLN
INTRAMUSCULAR | Status: DC | PRN
  Administered 2011-10-26: 150 mg

## 2011-10-26 MED ORDER — METHOCARBAMOL 500 MG PO TABS
500.0000 mg | ORAL_TABLET | Freq: Four times a day (QID) | ORAL | Status: DC | PRN
  Administered 2011-10-29: 500 mg via ORAL
  Filled 2011-10-26 (×2): qty 1

## 2011-10-26 MED ORDER — INSULIN GLARGINE 100 UNIT/ML ~~LOC~~ SOLN
15.0000 [IU] | Freq: Every day | SUBCUTANEOUS | Status: DC
  Administered 2011-10-27: 15 [IU] via SUBCUTANEOUS

## 2011-10-26 MED ORDER — PAROXETINE HCL 10 MG PO TABS
10.0000 mg | ORAL_TABLET | Freq: Every day | ORAL | Status: DC
  Administered 2011-10-27 – 2011-10-28 (×3): 10 mg via ORAL
  Filled 2011-10-26 (×6): qty 1

## 2011-10-26 MED ORDER — ADULT MULTIVITAMIN W/MINERALS CH
1.0000 | ORAL_TABLET | Freq: Every day | ORAL | Status: DC
  Administered 2011-10-27 – 2011-10-29 (×4): 1 via ORAL
  Filled 2011-10-26 (×5): qty 1

## 2011-10-26 MED ORDER — CHLORHEXIDINE GLUCONATE 4 % EX LIQD: 60.0000 mL | Freq: Every day | CUTANEOUS | Status: DC

## 2011-10-26 MED ORDER — MORPHINE SULFATE (PF) 1 MG/ML IJ SOLN: 1.0000 mg | INTRAMUSCULAR | Status: DC | PRN

## 2011-10-26 MED ORDER — PHENOL 1.4 % MT LIQD
1.0000 | OROMUCOSAL | Status: DC | PRN
Start: 2011-10-26 — End: 2011-10-29

## 2011-10-26 MED ORDER — MENTHOL 3 MG MT LOZG: 1.0000 | LOZENGE | OROMUCOSAL | Status: DC | PRN

## 2011-10-26 MED ORDER — LACTATED RINGERS IV SOLN
INTRAVENOUS | Status: DC | PRN
  Administered 2011-10-26 (×2): via INTRAVENOUS

## 2011-10-26 MED ORDER — TOBRAMYCIN SULFATE 1.2 G IJ SOLR
INTRAMUSCULAR | Status: AC
  Filled 2011-10-26: qty 2.4

## 2011-10-26 MED ORDER — BUPIVACAINE-EPINEPHRINE PF 0.25-1:200000 % IJ SOLN
INTRAMUSCULAR | Status: AC
  Filled 2011-10-26: qty 30

## 2011-10-26 MED ORDER — PROPOFOL 10 MG/ML IV EMUL
INTRAVENOUS | Status: DC | PRN
  Administered 2011-10-26: 150 mg via INTRAVENOUS

## 2011-10-26 MED ORDER — INSULIN ASPART 100 UNIT/ML ~~LOC~~ SOLN
0.0000 [IU] | Freq: Three times a day (TID) | SUBCUTANEOUS | Status: DC
  Administered 2011-10-26: 5 [IU] via SUBCUTANEOUS
  Administered 2011-10-27: 3 [IU] via SUBCUTANEOUS
  Administered 2011-10-27: 2 [IU] via SUBCUTANEOUS
  Administered 2011-10-27: 13:00:00 via SUBCUTANEOUS
  Administered 2011-10-28: 3 [IU] via SUBCUTANEOUS
  Administered 2011-10-28: 5 [IU] via SUBCUTANEOUS
  Administered 2011-10-29: 3 [IU] via SUBCUTANEOUS
  Administered 2011-10-29: 2 [IU] via SUBCUTANEOUS

## 2011-10-26 MED ORDER — MORPHINE SULFATE 2 MG/ML IJ SOLN
INTRAMUSCULAR | Status: AC
  Filled 2011-10-26: qty 1

## 2011-10-26 MED ORDER — FENTANYL CITRATE 0.05 MG/ML IJ SOLN
INTRAMUSCULAR | Status: DC | PRN
  Administered 2011-10-26: 50 ug via INTRAVENOUS
  Administered 2011-10-26: 100 ug via INTRAVENOUS

## 2011-10-26 MED ORDER — INSULIN ASPART 100 UNIT/ML ~~LOC~~ SOLN
3.0000 [IU] | Freq: Three times a day (TID) | SUBCUTANEOUS | Status: DC
  Administered 2011-10-26 – 2011-10-27 (×3): 3 [IU] via SUBCUTANEOUS

## 2011-10-26 MED ORDER — ACETAMINOPHEN 10 MG/ML IV SOLN
1000.0000 mg | Freq: Once | INTRAVENOUS | Status: DC
  Filled 2011-10-26: qty 100

## 2011-10-26 MED ORDER — BUPIVACAINE-EPINEPHRINE (PF) 0.5% -1:200000 IJ SOLN
INTRAMUSCULAR | Status: AC
  Filled 2011-10-26: qty 10

## 2011-10-26 MED ORDER — CHLORHEXIDINE GLUCONATE 4 % EX LIQD: 60.0000 mL | Freq: Once | CUTANEOUS | Status: DC

## 2011-10-26 MED ORDER — SODIUM CHLORIDE 0.9 % IV SOLN
INTRAVENOUS | Status: DC
  Administered 2011-10-27: 02:00:00 via INTRAVENOUS

## 2011-10-26 MED ORDER — DILTIAZEM HCL ER 240 MG PO CP24
240.0000 mg | ORAL_CAPSULE | Freq: Every day | ORAL | Status: DC
  Administered 2011-10-28 – 2011-10-29 (×2): 240 mg via ORAL
  Filled 2011-10-26 (×3): qty 1

## 2011-10-26 MED ORDER — ACETAMINOPHEN 10 MG/ML IV SOLN
1000.0000 mg | Freq: Four times a day (QID) | INTRAVENOUS | Status: AC
  Administered 2011-10-26 – 2011-10-27 (×4): 1000 mg via INTRAVENOUS
  Filled 2011-10-26 (×4): qty 100

## 2011-10-26 MED ORDER — ONE-DAILY MULTI VITAMINS PO TABS: 1.0000 | ORAL_TABLET | Freq: Every day | ORAL | Status: DC

## 2011-10-26 MED ORDER — TOBRAMYCIN SULFATE 1.2 G IJ SOLR
INTRAMUSCULAR | Status: AC
  Filled 2011-10-26: qty 1.2

## 2011-10-26 MED ORDER — LEVALBUTEROL HCL 0.63 MG/3ML IN NEBU: 0.6300 mg | INHALATION_SOLUTION | Freq: Four times a day (QID) | RESPIRATORY_TRACT | Status: DC | PRN

## 2011-10-26 SURGICAL SUPPLY — 82 items
ADAPTER BOLT FEMORAL +2/-2 (Knees) ×1 IMPLANT
ADPR FEM +2/-2 OFST BOLT (Knees) ×1 IMPLANT
ADPR FEM 5D STRL KN PFC SGM (Orthopedic Implant) ×1 IMPLANT
AUG FEM SZ4 4 STRL LF KN LT TI (Knees) ×1 IMPLANT
AUG FEM SZ4 8 CMB POST STRL LF (Orthopedic Implant) ×1 IMPLANT
AUG FEM SZ4 8 STRL LF KN LT TI (Knees) ×1 IMPLANT
AUGMENT FEM SZ4 4 LT DIST PFC (Knees) ×1 IMPLANT
AUGMENT FMRL PST PFC SIG SZ4 8 (Orthopedic Implant) IMPLANT
AUGMENT POSTERIOR PFC (Knees) ×1 IMPLANT
BANDAGE ESMARK 6X9 LF (GAUZE/BANDAGES/DRESSINGS) ×1 IMPLANT
BLADE SAGITTAL 25.0X1.19X90 (BLADE) ×2 IMPLANT
BLADE SAW SGTL 13.0X1.19X90.0M (BLADE) ×1 IMPLANT
BLADE SAW SGTL NAR THIN XSHT (BLADE) ×1 IMPLANT
BNDG CMPR 9X6 STRL LF SNTH (GAUZE/BANDAGES/DRESSINGS) ×1
BNDG ESMARK 6X9 LF (GAUZE/BANDAGES/DRESSINGS) ×2
BOWL SMART MIX CTS (DISPOSABLE) ×1 IMPLANT
CEMENT HV SMART SET (Cement) ×6 IMPLANT
CEMENT RESTRICTOR DEPUY SZ 4 (Cement) ×2 IMPLANT
CLOTH BEACON ORANGE TIMEOUT ST (SAFETY) ×2 IMPLANT
COMP FEM CEM LT SZ4 (Orthopedic Implant) ×2 IMPLANT
COMPONENT FEM CEM LT SZ4 (Orthopedic Implant) IMPLANT
CONT SPECI 4OZ STER CLIK (MISCELLANEOUS) ×2 IMPLANT
COVER SURGICAL LIGHT HANDLE (MISCELLANEOUS) ×2 IMPLANT
CUFF TOURNIQUET SINGLE 34IN LL (TOURNIQUET CUFF) ×1 IMPLANT
CUFF TOURNIQUET SINGLE 44IN (TOURNIQUET CUFF) IMPLANT
DRAPE EXTREMITY T 121X128X90 (DRAPE) ×2 IMPLANT
DRSG ADAPTIC 3X8 NADH LF (GAUZE/BANDAGES/DRESSINGS) ×1 IMPLANT
DRSG PAD ABDOMINAL 8X10 ST (GAUZE/BANDAGES/DRESSINGS) ×2 IMPLANT
DURAPREP 26ML APPLICATOR (WOUND CARE) ×2 IMPLANT
ELECT REM PT RETURN 9FT ADLT (ELECTROSURGICAL) ×2
ELECTRODE REM PT RTRN 9FT ADLT (ELECTROSURGICAL) ×1 IMPLANT
EVACUATOR 1/8 PVC DRAIN (DRAIN) ×1 IMPLANT
FACESHIELD LNG OPTICON STERILE (SAFETY) ×5 IMPLANT
FEM POST AUG PFC SIGMA SZ4 8MM (Orthopedic Implant) ×2 IMPLANT
FEMORAL ADAPTER (Orthopedic Implant) ×1 IMPLANT
GLOVE BIOGEL PI IND STRL 7.0 (GLOVE) IMPLANT
GLOVE BIOGEL PI IND STRL 8 (GLOVE) ×1 IMPLANT
GLOVE BIOGEL PI IND STRL 8.5 (GLOVE) IMPLANT
GLOVE BIOGEL PI INDICATOR 7.0 (GLOVE) ×1
GLOVE BIOGEL PI INDICATOR 8 (GLOVE) ×1
GLOVE BIOGEL PI INDICATOR 8.5 (GLOVE)
GLOVE ECLIPSE 8.0 STRL XLNG CF (GLOVE) ×3 IMPLANT
GLOVE SURG SS PI 6.5 STRL IVOR (GLOVE) ×2 IMPLANT
GLOVE SURG SS PI 7.5 STRL IVOR (GLOVE) ×1 IMPLANT
GOWN PREVENTION PLUS XLARGE (GOWN DISPOSABLE) ×6 IMPLANT
GOWN PREVENTION PLUS XXLARGE (GOWN DISPOSABLE) ×1 IMPLANT
GOWN STRL NON-REIN LRG LVL3 (GOWN DISPOSABLE) ×4 IMPLANT
HANDPIECE INTERPULSE COAX TIP (DISPOSABLE) ×2
INSERT TC3 TIBIAL SZ4 20.0 (Knees) ×1 IMPLANT
KIT BASIN OR (CUSTOM PROCEDURE TRAY) ×2 IMPLANT
KIT ROOM TURNOVER OR (KITS) ×2 IMPLANT
MANIFOLD NEPTUNE II (INSTRUMENTS) ×2 IMPLANT
NEEDLE 22X1 1/2 (OR ONLY) (NEEDLE) ×2 IMPLANT
NS IRRIG 1000ML POUR BTL (IV SOLUTION) ×2 IMPLANT
PACK TOTAL JOINT (CUSTOM PROCEDURE TRAY) ×2 IMPLANT
PAD ARMBOARD 7.5X6 YLW CONV (MISCELLANEOUS) ×4 IMPLANT
PAD CAST 4YDX4 CTTN HI CHSV (CAST SUPPLIES) ×1 IMPLANT
PADDING CAST COTTON 4X4 STRL (CAST SUPPLIES)
PADDING CAST COTTON 6X4 STRL (CAST SUPPLIES) ×1 IMPLANT
PATELLA DOME PFC 41MM (Knees) ×1 IMPLANT
POSTIER AUGMENT PFC (Knees) ×2 IMPLANT
SET HNDPC FAN SPRY TIP SCT (DISPOSABLE) ×1 IMPLANT
SPONGE GAUZE 4X4 12PLY (GAUZE/BANDAGES/DRESSINGS) ×1 IMPLANT
STAPLER VISISTAT 35W (STAPLE) ×2 IMPLANT
STEM TIBIA PFC 13X30MM (Stem) ×1 IMPLANT
STEM UNIVERSAL REVISION 75X16 (Stem) ×1 IMPLANT
SUCTION FRAZIER TIP 10 FR DISP (SUCTIONS) ×2 IMPLANT
SUT BONE WAX W31G (SUTURE) ×2 IMPLANT
SUT ETHIBOND NAB CT1 #1 30IN (SUTURE) ×8 IMPLANT
SUT MNCRL AB 3-0 PS2 18 (SUTURE) ×4 IMPLANT
SUT PDS AB 1 CT  36 (SUTURE) ×3
SUT PDS AB 1 CT 36 (SUTURE) ×3 IMPLANT
SUT VIC AB 0 CT1 27 (SUTURE) ×2
SUT VIC AB 0 CT1 27XBRD ANBCTR (SUTURE) ×1 IMPLANT
SUT VIC AB 2-0 FS1 27 (SUTURE) ×4 IMPLANT
SWAB COLLECTION DEVICE MRSA (MISCELLANEOUS) ×1 IMPLANT
SYR CONTROL 10ML LL (SYRINGE) ×1 IMPLANT
TRAY FOLEY CATH 14FR (SET/KITS/TRAYS/PACK) ×2 IMPLANT
TRAY SLEEVE CEM ML (Knees) ×1 IMPLANT
TRAY TIB SZ 5 REVISION (Knees) ×1 IMPLANT
TUBE ANAEROBIC SPECIMEN COL (MISCELLANEOUS) ×1 IMPLANT
WATER STERILE IRR 1000ML POUR (IV SOLUTION) ×5 IMPLANT

## 2011-10-26 NOTE — Anesthesia Procedure Notes (Signed)
Anesthesia Regional Block:  Femoral nerve block  Pre-Anesthetic Checklist: ,, timeout performed, Correct Patient, Correct Site, Correct Laterality, Correct Procedure,, site marked, risks and benefits discussed, Surgical consent,  Pre-op evaluation,  At surgeon's request and post-op pain management  Laterality: Left  Prep: chloraprep       Needles:  Injection technique: Single-shot  Needle Type: Echogenic Stimulator Needle     Needle Length: 5cm 5 cm Needle Gauge: 22 and 22 G    Additional Needles:  Procedures: ultrasound guided and nerve stimulator Femoral nerve block  Nerve Stimulator or Paresthesia:  Response: quadraceps contraction, 0.45 mA,   Additional Responses:   Narrative:  Start time: 10/26/2011 7:13 AM End time: 10/26/2011 7:22 AM Injection made incrementally with aspirations every 5 mL.  Performed by: Personally  Anesthesiologist: Halford Decamp, MD  Additional Notes: Functioning IV was confirmed and monitors were applied.  A 50mm 22ga Arrow echogenic stimulator needle was used. Sterile prep and drape,hand hygiene and sterile gloves were used. Ultrasound guidance: relevant anatomy identified, needle position confirmed, local anesthetic spread visualized around nerve(s)., vascular puncture avoided.  Image printed for medical record. Negative aspiration and negative test dose prior to incremental administration of local anesthetic. The patient tolerated the procedure well.    Femoral nerve block

## 2011-10-26 NOTE — Progress Notes (Signed)
Patient ID: [name], male   DOB: [birth year], 71 y.o.   MRN: [id number] There has been no change in health status since  the current H&P.I have examined the patient and discussed the surgery. No contraindications to the planned procedure exist.

## 2011-10-26 NOTE — Anesthesia Postprocedure Evaluation (Signed)
Anesthesia Post Note  Patient: [name]  Procedure(s) Performed: Procedure(s) (LRB): TOTAL KNEE REVISION (Left)  Anesthesia type: general  Patient location: PACU  Post pain: Pain level controlled  Post assessment: Patient's Cardiovascular Status Stable  Last Vitals:  Filed Vitals:   10/26/11 1200  BP:   Pulse: 61  Temp:   Resp: 10    Post vital signs: Reviewed and stable  Level of consciousness: sedated  Complications: No apparent anesthesia complications

## 2011-10-26 NOTE — Progress Notes (Signed)
Orthopedic Tech Progress Note Patient Details:  [name] 05-18-1940 [id number]  CPM Left Knee CPM Left Knee: On Left Knee Flexion (Degrees): 45  Left Knee Extension (Degrees): 0  Additional Comments: trapeze bar   Cammer, Mickie Bail 10/26/2011, 12:53 PM

## 2011-10-26 NOTE — Consult Note (Signed)
[facility] Medical Consultation  [name] ZOX:[id number] DOB: [birth year] DOA: 10/26/2011 PCP: Ailene Ravel, MD   Requesting physician: Dr. [name] Date of consultation: July 23 Reason for consultation: Diabetes management in a patient with infected prosthesis  Impression/Recommendations Active Problems:  DM (diabetes mellitus), type 2 with complications  HYPERCHOLESTEROLEMIA  IIA  GOUT  ANEMIA, NORMOCYTIC, CHRONIC  DEPRESSION  HYPERTENSION, UNSPECIFIED  CAD, UNSPECIFIED SITE  COLITIS, ULCERATIVE NOS  Infection of prosthetic left knee joint  CKD (chronic kidney disease) stage 3, GFR 30-59 ml/min  Staphylococcus aureus bacteremia    1. Diabetes mellitus type 2 - the last documented hemoglobin A1c from May 2013 was 6.2 this indicates good outpatient control of the diabetes. We will resume the home regimen including Victoza. Generally speaking, the patient's insulin requirements goes down first 24 hour after surgery due to very little movement. We will place the patient on 15 units of Lantus at bedtime with NovoLog subcutaneous with meals. We shall employed at quick up titration of Lantus to the home dose of 20 units at bedtime as soon as the patient gets more mobile and appetite is restored. 2. History of coronary artery disease-without active ischemia. We'll continue beta blocker and statin 3. Hypertension-we'll continue calcium channel blocker and beta blocker 4. Patient history ulcerated colitis for which she underwent total colectomy and he currently has an ileostomy 5. Mild hyperkalemia-without clear cause-we need to make sure patient does not have a component of recurrent BPH with urinary retention. The patient has had a TURP in the past.  Repeat basic metabolic profile tomorrow 6. Management of the infection of the prosthetic left knee joint will be left to the latitude of the primary team  We will followup again tomorrow. Please contact us if we can be of  assistance in the meanwhile. Thank you for this consultation.    HPI:  71 year old gentleman who was admitted earlier today for an elective left knee prosthetic joint replacement. We were consulted postoperatively to help with diabetes management  Review of Systems:  The patient denies any fevers chills, nausea vomiting, he has a ileostomy bag which empties 4 times a day Patient denies any chest pain or shortness of breath Currently he complains of some postoperative mild pain Patient reports having to get up twice at night to urinate All other systems reviewed and negative or per history of present illness  Past Medical History  Diagnosis Date  . Gout   . Depression   . Diabetes mellitus   . Unspecified essential hypertension   . Hypercholesteremia     IIA  . Colitis, ulcerative     NOS  . Personal hx-rectal/anal malignancy   . Urinary incontinence   . Benign prostatic hypertrophy     S/p TURP   . H/O ETOH abuse 06/06/2011  . 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 Pleurisy   . AAA (abdominal aortic aneurysm) 1992    S/p repair and grafting  . History of alcohol abuse   . CAD (coronary artery disease)   . Stroke     71 yrs old  . Stones in the urinary tract   . GERD (gastroesophageal reflux disease)   . Normocytic anemia     Chronic  . PAF (paroxysmal atrial fibrillation)     during hospitalization 06/2011 for sepsis  . Cardiomyopathy, ischemic     EF 40-45% by echo 06/2011  . Bladder cancer 2006  . Bladder cancer  Transitional Cell  . Colon cancer   . Chronic bronchitis     "used to get it quite often; found out medicine was causing it"  . Arthritis     "left knee"   Past Surgical History  Procedure Date  . Transurethral resection of prostate   . Abdominal aortic aneurysm repair 1992    "had 3 blockages; 3 aneurysms repaired at the same time"  . I&d knee with poly exchange 06/12/2011    Procedure: IRRIGATION AND  DEBRIDEMENT KNEE WITH POLY EXCHANGE;  Surgeon: Thera Flake., MD;  Location: [facility]S;  Service: Orthopedics;  Laterality: Left;  . Appendectomy   . Eye surgery   . Coronary angioplasty     1992  . Colonoscopy w/ polypectomy   . Cataract extraction w/ intraocular lens  implant, bilateral ~ 2010  . Total knee arthroplasty 1990's    left  . Joint replacement     left knee  . Total knee revision 10/26/11    left   Social History:  reports that he quit smoking about 41 years ago. His smoking use included Cigarettes. He has a 24 pack-year smoking history. His smokeless tobacco use includes Chew. He reports that he does not drink alcohol or use illicit drugs.  Allergies  Allergen Reactions  . Fentanyl Other (See Comments)    Delirium  . Hydromorphone Hcl Other (See Comments)    Delirium  . Lorazepam Other (See Comments)    Delirium  . Oxycodone-Acetaminophen     Delirium   Family History  Problem Relation Age of Onset  . Dementia Mother   . COPD Father   . Anesthesia problems Neg Hx     Prior to Admission medications   Medication Sig Start Date End Date Taking? Authorizing Provider  diltiazem (DILACOR XR) 240 MG 24 hr capsule Take 240 mg by mouth daily.   Yes Historical Provider, MD  metoprolol tartrate (LOPRESSOR) 25 MG tablet Take 25 mg by mouth 2 (two) times daily. 06/24/11 06/23/12 Yes Gwenyth Bender, NP  Multiple Vitamin (MULTIVITAMIN) tablet Take 1 tablet by mouth daily.     Yes Historical Provider, MD  omega-3 acid ethyl esters (LOVAZA) 1 G capsule Take 4 g by mouth daily.   Yes Historical Provider, MD  pantoprazole (PROTONIX) 40 MG tablet Take 40 mg by mouth daily at 12 noon. 06/24/11 06/23/12 Yes Lesle Chris Black, NP  PARoxetine (PAXIL) 20 MG tablet Take 10-20 mg by mouth 2 (two) times daily. 1 tab in am, 0.5 tab in pm   Yes Historical Provider, MD  rosuvastatin (CRESTOR) 20 MG tablet Take 20 mg by mouth at bedtime.   Yes Historical Provider, MD  levalbuterol Pauline Aus) 0.63  MG/3ML nebulizer solution Take 0.63 mg by nebulization every 6 (six) hours as needed. Shortness of breath 06/24/11 06/23/12  Gwenyth Bender, NP  morphine, PF, 1 MG/ML injection Inject 1-2 mLs (1-2 mg total) into the vein every 2 (two) hours as needed (severe pain). 10/26/11   [name], PA   Physical Exam: Blood pressure 126/56, pulse 58, temperature 97.6 F (36.4 C), temperature source Oral, resp. rate 14, height 6' (1.829 m), weight 106.595 kg (235 lb), SpO2 100.00%. Filed Vitals:   10/26/11 1249 10/26/11 1250 10/26/11 1323 10/26/11 1329  BP:    126/56  Pulse: 58 59  58  Temp:    97.6 F (36.4 C)  TempSrc:    Oral  Resp: 12 12  14   Height:  6' (1.829 m)  Weight:    106.595 kg (235 lb)  SpO2: 100% 100% 100% 100%     General:  Alert and oriented x3  Eyes: Pupil equal round react to light accommodation extraocular movements intact  ENT: No pharyngeal erythema, no exudates, normal appearing external ears  Neck: No thyromegaly no JVD  Cardiovascular: Regular rate and rhythm without murmurs rubs or gallops  Respiratory: Clear to auscultation bilaterally  Abdomen: Soft, ileostomy bag present with liquid stool  Skin: Warm dry  Musculoskeletal: Status post left knee surgery  Psychiatric: Euthymic  Neurologic: Grossly normal  Labs on Admission:  Basic Metabolic Panel:  Lab 10/26/11 2956 10/20/11 1025  NA 137 136  K 5.4* 6.1*  CL 104 104  CO2 22 22  GLUCOSE 203* 269*  BUN 28* 29*  CREATININE 1.64* 1.68*  CALCIUM 10.0 10.3  MG -- --  PHOS -- --   Liver Function Tests:  Lab 10/20/11 1025  AST 21  ALT 17  ALKPHOS 53  BILITOT 0.3  PROT 7.7  ALBUMIN 3.8   No results found for this basename: LIPASE:5,AMYLASE:5 in the last 168 hours No results found for this basename: AMMONIA:5 in the last 168 hours CBC:  Lab 10/20/11 1025  WBC 6.9  NEUTROABS 3.8  HGB 11.2*  HCT 33.5*  MCV 84.0  PLT 168   Cardiac Enzymes: No results found for this basename:  CKTOTAL:5,CKMB:5,CKMBINDEX:5,TROPONINI:5 in the last 168 hours BNP: No components found with this basename: POCBNP:5 CBG:  Lab 10/26/11 1622 10/26/11 1221 10/26/11 0620  GLUCAP 222* 201* 202*     Marcelle Bebout [facility] Pager [phone]  If 7PM-7AM, please contact night-coverage [email or web address] Password [facility] 10/26/2011, 4:32 PM

## 2011-10-26 NOTE — Progress Notes (Signed)
Hemovac was unclamped at 1500 after 4 hours.  At 1845, total hemovac output was 500cc.  Per MD order, hemovac clamped.  To be unclamped at 2245.  Continue to monitor.  Pt resting quietly at present time.

## 2011-10-26 NOTE — Op Note (Signed)
[name], [name] NO.:  [id number]  MEDICAL RECORD NO.:  [id number]  LOCATION:  [facility]                         FACILITY:  [facility]  PHYSICIAN:  Claude Manges. January [name], M.D.DATE OF BIRTH:  1940/06/17  DATE OF PROCEDURE:  10/26/2011 DATE OF DISCHARGE:                              OPERATIVE REPORT   PREOPERATIVE DIAGNOSIS:  Infected left total knee replacement (MSSA), status post I and D and antibiotic spacer.  POSTOPERATIVE DIAGNOSIS:  Infected left total knee replacement (MSSA), status post I and D and antibiotic spacer.  PROCEDURE:  Revision, left total knee replacement.  SURGEON:  Claude Manges. Cleophas Dunker, MD.  ASSISTANTArlys John D. Petrarca, PAC.  ANESTHESIA:  General with supplemental femoral nerve block.  COMPLICATIONS:  None.  PROCEDURE IN DETAIL:  [name] was met in the holding area.  I marked his left knee as the appropriate operative site.  Anesthesia performed a femoral nerve block.  The patient was then transported to room #7 and placed under general anesthesia without difficulty.  Nursing staff inserted a Foley catheter.  Urine was clear.  Tourniquet was applied to the left thigh.  The leg was then prepped with Betadine scrub and DuraPrep and the tourniquet to the midfoot.  Sterile draping was performed.  With the extremity still elevated, it was Esmarch exsanguinated with the proximal tourniquet at 350 mmHg.  The prior longitudinal incision was utilized and via sharp dissection carried down to the subcutaneous tissue.  The prior medial parapatellar incision was identified by virtue of the monofilament suture.  Using the 15 blade knife, the medial capsule was incised and joint was entered. There was a serosanguineous effusion.  We sent specimens for aerobic and anaerobic culture.  The patella was then everted to 180 degrees and knee flexed to 90 degrees.  I released the anterior medial and antral lateral capsule from the tibia.  There was minimal  synovitis.  There was no obvious evidence of infection.  I did send some specimens of synovium to the lab for culture and sensitivity.  The antibiotic spacers were then removed with a combination of oscillating saw and the osteotome.  We initially removed the femoral component followed by the tibial component.  I amputated the stem from the polyethylene bearing and then removed in one piece.  Synovectomy was performed around the tibia.  I then reamed the tibia to a 15.  I used a central sleeve in the tibial canal to stabilize the reamer.  I then applied the transverse cutting jig and performed a clean-up cut on the proximal tibia.  I measured a #5 tibial tray.  This was inserted.  We checked our alignment with the alignment guide and then made the keeled cut with a very nice flat surface. Tension was then placed on the femur.  I hand reamed the canal to 16 and with the reamer in place, we inserted the femoral cutting jig.  We measured a #4 femoral tray and after all of our cuts, I needed several augments.  I used an 8-mm laterally and posteriorly a 4-mm medially and distally and 8 mm distally and laterally, followed by the 75 mm x 16 mm stem.  The trial  construct was then inserted and felt we had excellent stability.  We then measured a 20-mm polyethylene bridging bearing, and with this construct with the tibial tray in place, we had full extension, no opening with the varus or valgus stress.  The patella was then prepared by performing a flush transverse cut.  I measured a 41-mm patella.  3 PEG, 3 holes were then made with the trial. The trial patella was inserted.  The patella was quite tight laterally. I could not even reduce it, so I performed to control lateral release with the Bovie and at that point, I could reduce the patella.  All the trial components were then removed.  The joint was copiously irrigated with saline solution and then the final components were then inserted  using polymethylmethacrylate with 1 g of vancomycin per pack.  We initially inserted the #5 tibial tray with the 13 mm x 30 mm stem and a 29-mm metaphyseal sleeve.  I did insert a #4 cement restrictor.  The methacrylate was then prepared.  I used the glucagon inserted into the canal and then impacted the tibial tray.  It fit nicely on the proximal tibia.  The femoral component was then impacted.  The final component was the #4 with a 75 mm x 16 mm fluted stem, which was not cemented and there was a 5 degree femoral adapter and the femoral adapter bolt used the augments as above.  I used two distally and one posteriorly and with the methylmethacrylate mixed, the components were then impacted.  I inserted the trial 20-mm polyethylene bearing and the knee was then reduced.  It would not open up with varus or valgus stress.  We had over 100 degrees of flexion.  With the knee was kept in extension with compression and extraneous methacrylate was removed from all the components.  The patella was then inserted with the same polymethylmethacrylate with vancomycin applied with the patellar clamp.  At approximately 16 minutes, the methacrylate was matured and hardened.  In the meanwhile, we performed further synovectomy and injected the capsule with 0.25% Marcaine with epinephrine.  The tourniquet was deflated at 113 minutes. We had nice bleeding throughout the joint surface.  Bleeding was controlled with the Bovie.  A medium-sized Hemovac was then inserted.  With the joint reduced, the patella still was intractable laterally, so we imbricated the medial capsule and felt at that point that it was quite stable.  We used a #1 PDS suture.  The subcu was closed with 3-0 Monocryl.  Skin was closed with skin clips.  Sterile bulky dressing was applied followed by the patient's support stocking.  We had excellent construct.  Technically, the procedure went quite well and it was very nice and  stable.  The patient then was awoken and returned to the post anesthesia recovery without complications.     Claude Manges. Cleophas Dunker, M.D.     PWW/MEDQ  D:  10/26/2011  T:  10/26/2011  Job:  [id number]

## 2011-10-26 NOTE — Preoperative (Signed)
Beta Blockers   Reason not to administer Beta Blockers:Not Applicable 

## 2011-10-26 NOTE — Transfer of Care (Signed)
Immediate Anesthesia Transfer of Care Note  Patient: [name]  Procedure(s) Performed: Procedure(s) (LRB): TOTAL KNEE REVISION (Left)  Patient Location: PACU  Anesthesia Type: General  Level of Consciousness: awake and alert   Airway & Oxygen Therapy: Patient Spontanous Breathing and Patient connected to nasal cannula oxygen  Post-op Assessment: Report given to PACU RN and Post -op Vital signs reviewed and stable  Post vital signs: Reviewed and stable  Complications: No apparent anesthesia complications

## 2011-10-26 NOTE — Brief Op Note (Signed)
10/26/2011  10:48 AM  PATIENT:  [name]  71 y.o. male  PRE-OPERATIVE DIAGNOSIS:  infected left total knee replacement S/P I&D with antibiotic spacer                                                       BMI 34.4 POST-OPERATIVE DIAGNOSIS:  Same   PROCEDURE:  Procedure(s) (LRB): TOTAL KNEE REVISION (Left)  SURGEON:  Surgeon(s) and Role:    * Valeria Batman, MD - Primary  PHYSICIAN ASSISTANT: [name], PAC   ANESTHESIA:   regional and general  EBL:  Total I/O In: -  Out: 150 [Urine:150]  BLOOD ADMINISTERED:none  DRAINS: (left knee) Hemovact drain(s) in the open with  Suction Open   LOCAL MEDICATIONS USED:  MARCAINE     SPECIMEN:  No Specimen  DISPOSITION OF SPECIMEN:  PATHOLOGY  COUNTS:  YES  TOURNIQUET:  113 minutes  DICTATION: .Other Dictation: Dictation Number [id number].Marland KitchenMarland KitchenMarland KitchenMarland Kitchen  PLAN OF CARE: Admit to inpatient   PATIENT DISPOSITION:  PACU - hemodynamically stable.   Delay start of Pharmacological VTE agent (>24hrs) due to surgical blood loss or risk of bleeding: not applicable

## 2011-10-26 NOTE — Anesthesia Preprocedure Evaluation (Addendum)
Anesthesia Evaluation    Airway Mallampati: I TM Distance: >3 FB Neck ROM: Full    Dental  (+) Edentulous Upper, Edentulous Lower and Dental Advisory Given   Pulmonary  breath sounds clear to auscultation  Pulmonary exam normal       Cardiovascular hypertension, + CAD + dysrhythmias Atrial Fibrillation Rhythm:Regular Rate:Normal     Neuro/Psych PSYCHIATRIC DISORDERS Depression CVA    GI/Hepatic PUD, GERD-  Medicated,  Endo/Other  Insulin Dependent  Renal/GU      Musculoskeletal   Abdominal   Peds  Hematology   Anesthesia Other Findings   Reproductive/Obstetrics                          Anesthesia Physical Anesthesia Plan  ASA: III  Anesthesia Plan: General   Post-op Pain Management:    Induction: Intravenous  Airway Management Planned: LMA  Additional Equipment:   Intra-op Plan:   Post-operative Plan: Extubation in OR  Informed Consent: I have reviewed the patients History and Physical, chart, labs and discussed the procedure including the risks, benefits and alternatives for the proposed anesthesia with the patient or authorized representative who has indicated his/her understanding and acceptance.   Dental advisory given  Plan Discussed with: CRNA, Anesthesiologist and Surgeon  Anesthesia Plan Comments:         Anesthesia Quick Evaluation

## 2011-10-26 NOTE — Progress Notes (Signed)
UR COMPLETED  

## 2011-10-27 ENCOUNTER — Encounter (HOSPITAL_COMMUNITY): Payer: Self-pay | Admitting: Orthopaedic Surgery

## 2011-10-27 DIAGNOSIS — N179 Acute kidney failure, unspecified: Secondary | ICD-10-CM | POA: Diagnosis present

## 2011-10-27 DIAGNOSIS — D649 Anemia, unspecified: Secondary | ICD-10-CM

## 2011-10-27 DIAGNOSIS — T8450XA Infection and inflammatory reaction due to unspecified internal joint prosthesis, initial encounter: Secondary | ICD-10-CM

## 2011-10-27 LAB — BASIC METABOLIC PANEL
CO2: 22 mEq/L (ref 19–32)
Calcium: 8.9 mg/dL (ref 8.4–10.5)
Calcium: 9.2 mg/dL (ref 8.4–10.5)
Chloride: 104 mEq/L (ref 96–112)
GFR calc Af Amer: 30 mL/min — ABNORMAL LOW (ref >90)
GFR calc non Af Amer: 26 mL/min — ABNORMAL LOW (ref >90)
Glucose, Bld: 181 mg/dL — ABNORMAL HIGH (ref 70–99)
Potassium: 5.4 mEq/L — ABNORMAL HIGH (ref 3.5–5.1)
Sodium: 138 mEq/L (ref 135–145)
Sodium: 135 mEq/L (ref 135–145)

## 2011-10-27 LAB — GLUCOSE, CAPILLARY
Glucose-Capillary: 174 mg/dL — ABNORMAL HIGH (ref 70–99)
Glucose-Capillary: 204 mg/dL — ABNORMAL HIGH (ref 70–99)
Glucose-Capillary: 135 mg/dL — ABNORMAL HIGH (ref 70–99)

## 2011-10-27 LAB — HEMOGLOBIN A1C
Hgb A1c MFr Bld: 6.6 % — ABNORMAL HIGH (ref <5.7)
Mean Plasma Glucose: 143 mg/dL — ABNORMAL HIGH (ref <117)

## 2011-10-27 LAB — CBC
Hemoglobin: 7.9 g/dL — ABNORMAL LOW (ref 13.0–17.0)
MCH: 28.3 pg (ref 26.0–34.0)
RBC: 2.79 MIL/uL — ABNORMAL LOW (ref 4.22–5.81)

## 2011-10-27 LAB — PREPARE RBC (CROSSMATCH)

## 2011-10-27 MED ORDER — INSULIN ASPART 100 UNIT/ML ~~LOC~~ SOLN
5.0000 [IU] | Freq: Three times a day (TID) | SUBCUTANEOUS | Status: DC
  Administered 2011-10-28 – 2011-10-29 (×4): 5 [IU] via SUBCUTANEOUS

## 2011-10-27 MED ORDER — DILTIAZEM HCL ER COATED BEADS 120 MG PO CP24
120.0000 mg | ORAL_CAPSULE | Freq: Once | ORAL | Status: AC
  Administered 2011-10-27: 120 mg via ORAL
  Filled 2011-10-27: qty 1

## 2011-10-27 MED ORDER — METOPROLOL TARTRATE 12.5 MG HALF TABLET
12.5000 mg | ORAL_TABLET | Freq: Once | ORAL | Status: AC
  Administered 2011-10-27: 12.5 mg via ORAL
  Filled 2011-10-27: qty 1

## 2011-10-27 MED ORDER — INSULIN GLARGINE 100 UNIT/ML ~~LOC~~ SOLN
17.0000 [IU] | Freq: Every day | SUBCUTANEOUS | Status: DC
  Administered 2011-10-27: 17 [IU] via SUBCUTANEOUS

## 2011-10-27 NOTE — Progress Notes (Signed)
Spoke with Revonda Standard at [facility]. Bed will be available for patient when medically stable.  Ok for d/c tomorrow if ok with MD.  Patient requests private room at facility; per facility this request will be honored.  Lorri Frederick. [place]  [phone]

## 2011-10-27 NOTE — Progress Notes (Signed)
CARE MANAGEMENT NOTE 10/27/2011  Patient:  [name], [name]   Account Number:  [id number]  Date Initiated:  10/27/2011  Documentation initiated by:  Vance Peper  Subjective/Objective Assessment:   71 yr old male s/p left total knee arthroplasty.     Action/Plan:   CM spoke with patient regarding home health needs at discharge. Preoperatively setup with [facility], will be going to [facility] for shortterm rehab.Lovette Cliche, Social Worker is aware.   Anticipated DC Date:  10/29/2011   Anticipated DC Plan:  SKILLED NURSING FACILITY  In-house referral  Clinical Social Worker      DC Planning Services  CM consult      [facility] [place] [facility]  NA   Choice offered to / List presented to:             Status of service:  Completed, signed off Medicare Important Message given?   (If response is "NO", the following Medicare IM given date fields will be blank) Date Medicare IM given:   Date Additional Medicare IM given:    Discharge Disposition:  SKILLED NURSING FACILITY  Per UR Regulation:    If discussed at Long Length of Stay Meetings, dates discussed:    Comments:

## 2011-10-27 NOTE — Progress Notes (Signed)
Patient ID: [name], male   DOB: 07/09/1940, 71 y.o.   MRN: [id number] PATIENT ID: JAHLIL [name]        MRN:  [id number]          DOB/AGE: September 17, 1940 / 71 y.o.  Logan Campbell, MD   [name], PA-C [address] [place], [place], [place]  [address]                             [phone]   PROGRESS NOTE  Subjective:  negative for Chest Pain  negative for Shortness of Breath  negative for Nausea/Vomiting   negative for Calf Pain  negative for Bowel Movement   Tolerating Diet: yes         Patient reports pain as mild.      Objective: Vital signs in last 24 hours:   Patient Vitals for the past 24 hrs:  BP Temp Temp src Pulse Resp SpO2 Height Weight  10/27/11 0510 150/54 mmHg 98.3 F (36.8 C) Oral - 18  100 % - -  10/27/11 0159 132/49 mmHg 98.1 F (36.7 C) Oral 72  18  100 % - -  10/27/11 0057 120/50 mmHg - - 67  - - - -  10/26/11 2123 121/55 mmHg 98 F (36.7 C) Oral 68  20  100 % - -  10/26/11 1900 109/53 mmHg 97.9 F (36.6 C) - 65  18  100 % - -  10/26/11 1600 - - - - 18  97 % - -  10/26/11 1329 126/56 mmHg 97.6 F (36.4 C) Oral 58  14  100 % 6' (1.829 m) 106.595 kg (235 lb)  10/26/11 1323 - - - - - 100 % - -  10/26/11 1250 - - - 59  12  100 % - -  10/26/11 1249 - - - 58  12  100 % - -  10/26/11 1248 - - - 58  10  100 % - -  10/26/11 1247 - - - 58  11  100 % - -  10/26/11 1246 - - - 59  13  100 % - -  10/26/11 1245 - 97.6 F (36.4 C) - 60  14  100 % - -  10/26/11 1244 - - - 61  16  100 % - -  10/26/11 1243 - - - 61  17  100 % - -  10/26/11 1242 - - - 60  12  100 % - -  10/26/11 1241 - - - 59  13  100 % - -  10/26/11 1240 - - - 60  11  100 % - -  10/26/11 1239 - - - 60  10  100 % - -  10/26/11 1238 121/56 mmHg - - 60  11  100 % - -  10/26/11 1237 - - - 61  13  100 % - -  10/26/11 1236 - - - 60  11  100 % - -  10/26/11 1235 - - - 59  10  100 % - -  10/26/11 1234 - - - 60  10  100 % - -  10/26/11 1233 - - - 59  11  100 % - -  10/26/11 1232 - - - 59  13   100 % - -  10/26/11 1231 - - - 60  13  100 % - -  10/26/11 1230 - - - 60  12  100 % - -  10/26/11 1229 - - - 60  10  100 % - -  10/26/11 1228 - - - 60  9  100 % - -  10/26/11 1227 - - - 60  9  100 % - -  10/26/11 1226 - - - 60  11  100 % - -  10/26/11 1225 - - - 60  10  100 % - -  10/26/11 1224 - - - 60  13  100 % - -  10/26/11 1223 115/60 mmHg - - 59  14  100 % - -  10/26/11 1222 - - - 59  12  100 % - -  10/26/11 1221 - - - 60  13  100 % - -  10/26/11 1220 - - - 60  12  100 % - -  10/26/11 1219 - - - 59  12  100 % - -  10/26/11 1218 - - - 60  12  100 % - -  10/26/11 1217 - - - 59  10  100 % - -  10/26/11 1216 - - - 60  11  100 % - -  10/26/11 1215 - - - 61  14  100 % - -  10/26/11 1214 - - - 60  12  100 % - -  10/26/11 1213 - - - 61  11  100 % - -  10/26/11 1212 - - - 61  11  100 % - -  10/26/11 1211 - - - 62  14  100 % - -  10/26/11 1210 - - - 62  13  100 % - -  10/26/11 1209 - - - 62  12  100 % - -  10/26/11 1208 131/63 mmHg - - 62  12  100 % - -  10/26/11 1207 - - - 61  11  100 % - -  10/26/11 1206 - - - 61  11  100 % - -  10/26/11 1205 - - - 63  12  100 % - -  10/26/11 1204 - - - 62  11  100 % - -  10/26/11 1203 - - - 61  11  100 % - -  10/26/11 1202 - - - 60  11  100 % - -  10/26/11 1201 - - - 61  11  100 % - -  10/26/11 1200 - - - 61  10  100 % - -  10/26/11 1159 - - - 61  10  100 % - -  10/26/11 1158 - - - 61  10  100 % - -  10/26/11 1157 - - - 61  10  100 % - -  10/26/11 1156 - - - 61  10  100 % - -  10/26/11 1155 - - - 61  10  100 % - -  10/26/11 1154 - - - 62  10  100 % - -  10/26/11 1153 119/55 mmHg - - 61  10  100 % - -  10/26/11 1152 - - - 62  12  100 % - -  10/26/11 1151 - - - 63  11  100 % - -  10/26/11 1150 - - - 62  11  100 % - -  10/26/11 1149 - - - 62  11  100 % - -  10/26/11 1148 - - - 62  11  100 % - -  10/26/11 1147 - - -  62  10  100 % - -  10/26/11 1146 - - - 62  10  100 % - -  10/26/11 1145 119/55 mmHg - - 62  10  100 % - -  10/26/11 1144 - -  - 63  10  100 % - -  10/26/11 1143 - - - 63  10  100 % - -  10/26/11 1142 - - - 62  10  100 % - -  10/26/11 1141 - - - 64  11  100 % - -  10/26/11 1140 - - - 63  11  100 % - -  10/26/11 1139 - - - 63  11  100 % - -  10/26/11 1138 122/56 mmHg - - 63  10  100 % - -  10/26/11 1137 - - - 64  11  100 % - -  10/26/11 1136 - - - 64  11  100 % - -  10/26/11 1135 - - - 64  10  100 % - -  10/26/11 1134 - - - 65  10  100 % - -  10/26/11 1133 - - - 64  11  100 % - -  10/26/11 1132 - - - 64  11  100 % - -  10/26/11 1131 - - - 64  10  100 % - -  10/26/11 1130 122/56 mmHg - - 64  11  100 % - -  10/26/11 1129 - - - 64  10  100 % - -  10/26/11 1128 - - - 64  10  100 % - -  10/26/11 1127 - - - 66  13  100 % - -  10/26/11 1126 - - - 65  12  100 % - -  10/26/11 1125 - - - 66  11  100 % - -  10/26/11 1124 - - - 66  11  100 % - -  10/26/11 1123 125/55 mmHg - - 67  13  100 % - -  10/26/11 1122 - - - 67  14  100 % - -  10/26/11 1121 - - - 67  14  100 % - -  10/26/11 1120 - - - 68  11  100 % - -  10/26/11 1119 - - - 68  10  100 % - -  10/26/11 1118 - - - 68  10  100 % - -  10/26/11 1117 - - - 68  13  100 % - -  10/26/11 1116 - - - 68  12  100 % - -  10/26/11 1115 125/55 mmHg - - 67  12  100 % - -  10/26/11 1114 - - - 67  11  100 % - -  10/26/11 1113 - - - 68  17  100 % - -  10/26/11 1112 - - - 68  16  100 % - -  10/26/11 1111 - - - 69  14  100 % - -  10/26/11 1110 - - - 70  14  99 % - -  10/26/11 1109 - - - 70  16  99 % - -  10/26/11 1108 116/50 mmHg - - - - - - -  10/26/11 1103 116/50 mmHg 97.1 F (36.2 C) - - - - - -      Intake/Output from previous day:   07/23 0701 - 07/24 0700 In: 2420 [P.O.:720; I.V.:1600] Out: 1825 [Urine:1150; Drains:600]   Intake/Output  this shift:       Intake/Output      07/23 0701 - 07/24 0700 07/24 0701 - 07/25 0700   P.O. 720    I.V. (mL/kg) 1600 (15)    IV Piggyback 100    Total Intake(mL/kg) 2420 (22.7)    Urine (mL/kg/hr) 1150 (0.4)    Drains 600     Blood 75    Total Output 1825    Net +595            LABORATORY DATA:  Basename 10/27/11 0630 10/20/11 1025  WBC 5.7 6.9  HGB 7.9* 11.2*  HCT 23.7* 33.5*  PLT 122* 168    Basename 10/26/11 0620 10/20/11 1025  NA 137 136  K 5.4* 6.1*  CL 104 104  CO2 22 22  BUN 28* 29*  CREATININE 1.64* 1.68*  GLUCOSE 203* 269*  CALCIUM 10.0 10.3   Lab Results  Component Value Date   INR 1.04 10/20/2011   INR 1.10 08/10/2011   INR 1.19 06/12/2011    Examination:  General appearance: alert, cooperative and no distress  Wound Exam: clean, dry, intact   Drainage:  Moderate amount Serosanguinous exudate 130cc in 12 hrs in hemovac  Motor Exam: EHL, FHL, Anterior Tibial and Posterior Tibial Intact  Sensory Exam: Superficial Peroneal, Deep Peroneal and Tibial normal  Vascular Exam: Normal  Assessment:    1 Day Post-Op  Procedure(s) (LRB): TOTAL KNEE REVISION (Left)  ADDITIONAL DIAGNOSIS:  Active Problems:  DM (diabetes mellitus), type 2 with complications  HYPERCHOLESTEROLEMIA  IIA  GOUT  ANEMIA, NORMOCYTIC, CHRONIC  DEPRESSION  HYPERTENSION, UNSPECIFIED  CAD, UNSPECIFIED SITE  COLITIS, ULCERATIVE NOS  Infection of prosthetic left knee joint  CKD (chronic kidney disease) stage 3, GFR 30-59 ml/min  Staphylococcus aureus bacteremia  Acute Blood Loss Anemia   Plan: Physical Therapy as ordered Weight Bearing as Tolerated (WBAT)  DVT Prophylaxis:  Xarelto  DISCHARGE PLAN: Skilled Nursing Facility/Rehab  DISCHARGE NEEDS: has own equipment    OOB with PT-- transfuse 1 unit packed cells today     Asher Torpey W 10/27/2011, 7:53 AM

## 2011-10-27 NOTE — Plan of Care (Signed)
Problem: Consults Goal: Diagnosis- Total Joint Replacement Outcome: Completed/Met Date Met:  10/27/11 Primary Total Knee

## 2011-10-27 NOTE — Progress Notes (Addendum)
Inpatient Diabetes Program Recommendations  AACE/ADA: New Consensus Statement on Inpatient Glycemic Control (2009)  Target Ranges:  Prepandial:   less than 140 mg/dL      Peak postprandial:   less than 180 mg/dL (1-2 hours)      Critically ill patients:  140 - 180 mg/dL    Results for BROCK, LARMON (MRN [id number]) as of 10/27/2011 13:26  Ref. Range 10/26/2011 06:20 10/26/2011 12:21 10/26/2011 16:22 10/26/2011 22:24  Glucose-Capillary Latest Range: 70-99 mg/dL 409 (H) 811 (H) 914 (H) 155 (H)    Results for MALCOMB, GANGEMI (MRN [id number]) as of 10/27/2011 13:26  Ref. Range 10/27/2011 07:09 10/27/2011 10:56  Glucose-Capillary Latest Range: 70-99 mg/dL 086 (H) 578 (H)    Patient's home diabetes regimen includes: Lantus 20 units QHS Novolog 16 units with breakfast/ 20 units with lunch/ 14 units with supper Victoza 1.2 mg daily  Inpatient Diabetes Program Recommendations Insulin - Meal Coverage: Patient takes large doses of Novolog at home- May need to increase meal coverage- Please consider increasing meal coverage to Novolog 8 units tid with meals if patient continues to have elevated postprandial CBGs.  Note: Will follow. Ambrose Finland RN, MSN, CDE Diabetes Coordinator Inpatient Diabetes Program [phone]

## 2011-10-27 NOTE — Progress Notes (Signed)
Subjective: No specific concerns.  Was working with physical therapy during my interview with the patient.  Objective: Vital signs in last 24 hours: Filed Vitals:   10/27/11 0159 10/27/11 0510 10/27/11 0858 10/27/11 0942  BP: 132/49 150/54 109/54 118/51  Pulse: 72  79 78  Temp: 98.1 F (36.7 C) 98.3 F (36.8 C) 98.1 F (36.7 C) 98.4 F (36.9 C)  TempSrc: Oral Oral Oral Oral  Resp: 18 18 16 18   Height:      Weight:      SpO2: 100% 100%     Weight change:   Intake/Output Summary (Last 24 hours) at 10/27/11 1125 Last data filed at 10/27/11 0818  Gross per 24 hour  Intake    820 ml  Output   1630 ml  Net   -810 ml    Physical Exam: General: Awake, Oriented, No acute distress. HEENT: EOMI. Neck: Supple CV: S1 and S2 Lungs: Clear to ascultation bilaterally Abdomen: Soft, Nontender, Nondistended, +bowel sounds. Ext: Good pulses. Trace edema.  Left knee in bandages.  Lab Results: Basic Metabolic Panel:  Lab 10/27/11 9147 10/26/11 0620  NA 138 137  K 5.4* 5.4*  CL 104 104  CO2 22 22  GLUCOSE 181* 203*  BUN 32* 28*  CREATININE 2.43* 1.64*  CALCIUM 8.9 10.0  MG -- --  PHOS -- --   Liver Function Tests: No results found for this basename: AST:5,ALT:5,ALKPHOS:5,BILITOT:5,PROT:5,ALBUMIN:5 in the last 168 hours No results found for this basename: LIPASE:5,AMYLASE:5 in the last 168 hours No results found for this basename: AMMONIA:5 in the last 168 hours CBC:  Lab 10/27/11 0630  WBC 5.7  NEUTROABS --  HGB 7.9*  HCT 23.7*  MCV 84.9  PLT 122*   Cardiac Enzymes: No results found for this basename: CKTOTAL:5,CKMB:5,CKMBINDEX:5,TROPONINI:5 in the last 168 hours BNP (last 3 results)  Basename 06/15/11 0500  PROBNP 10540.0*   CBG:  Lab 10/27/11 1056 10/27/11 0709 10/26/11 2224 10/26/11 1622 10/26/11 1221  GLUCAP 204* 174* 155* 222* 201*    Basename 10/26/11 1322  HGBA1C 6.6*   Other Labs: No components found with this basename: POCBNP:3 No results found  for this basename: DDIMER:2 in the last 168 hours No results found for this basename: CHOL:2,HDL:2,LDLCALC:2,TRIG:2,CHOLHDL:2,LDLDIRECT:2 in the last 168 hours No results found for this basename: TSH,T4TOTAL,FREET3,T3FREE,FREET4,THYROIDAB in the last 168 hours No results found for this basename: VITAMINB12:2,FOLATE:2,FERRITIN:2,TIBC:2,IRON:2,RETICCTPCT:2 in the last 168 hours  Micro Results: Recent Results (from the past 240 hour(s))  SURGICAL PCR SCREEN     Status: Abnormal   Collection Time   10/20/11 10:25 AM      Component Value Range Status Comment   MRSA, PCR NEGATIVE  NEGATIVE Final    Staphylococcus aureus POSITIVE (*) NEGATIVE Final   URINE CULTURE     Status: Normal   Collection Time   10/20/11 10:26 AM      Component Value Range Status Comment   Specimen Description URINE, CLEAN CATCH   Final    Special Requests NONE   Final    Culture  Setup Time 10/20/2011 15:03   Final    Colony Count NO GROWTH   Final    Culture NO GROWTH   Final    Report Status 10/21/2011 FINAL   Final   TISSUE CULTURE     Status: Normal (Preliminary result)   Collection Time   10/26/11  8:19 AM      Component Value Range Status Comment   Specimen Description TISSUE KNEE LEFT  Final    Special Requests SYNOVIUM LEFT KNEE NO 1   Final    Gram Stain     Final    Value: NO WBC SEEN     NO SQUAMOUS EPITHELIAL CELLS SEEN     NO ORGANISMS SEEN   Culture NO GROWTH 1 DAY   Final    Report Status PENDING   Incomplete   BODY FLUID CULTURE     Status: Normal (Preliminary result)   Collection Time   10/26/11  8:22 AM      Component Value Range Status Comment   Specimen Description SYNOVIAL FLUID LEFT KNEE   Final    Special Requests FLUID ON SWAB SAMPLE NO 2   Final    Gram Stain     Final    Value: NO WBC SEEN     NO ORGANISMS SEEN   Culture NO GROWTH 1 DAY   Final    Report Status PENDING   Incomplete   TISSUE CULTURE     Status: Normal (Preliminary result)   Collection Time   10/26/11  8:23 AM       Component Value Range Status Comment   Specimen Description TISSUE KNEE LEFT   Final    Special Requests TIBIAL TISSUE LEFT KNEE SAMPLE NO 3   Final    Gram Stain     Final    Value: NO WBC SEEN     RARE SQUAMOUS EPITHELIAL CELLS PRESENT     NO ORGANISMS SEEN   Culture NO GROWTH 1 DAY   Final    Report Status PENDING   Incomplete     Studies/Results: No results found.  Medications: I have reviewed the patient's current medications. Scheduled Meds:   . acetaminophen  1,000 mg Intravenous Q6H  . atorvastatin  40 mg Oral q1800  . diltiazem  120 mg Oral Once  . diltiazem  240 mg Oral Daily  . docusate sodium  100 mg Oral BID  . insulin aspart  0-15 Units Subcutaneous TID WC  . insulin aspart  0-5 Units Subcutaneous QHS  . insulin aspart  3 Units Subcutaneous TID WC  . insulin glargine  15 Units Subcutaneous QHS  . Liraglutide  1.2 mg Subcutaneous q morning - 10a  . metoprolol tartrate  12.5 mg Oral Once  . metoprolol tartrate  25 mg Oral BID  . morphine      . multivitamin with minerals  1 tablet Oral Daily  . omega-3 acid ethyl esters  4 g Oral Daily  . pantoprazole  40 mg Oral Q1200  . PARoxetine  10 mg Oral QHS  . PARoxetine  20 mg Oral Daily  . rivaroxaban  10 mg Oral QHS  . DISCONTD: acetaminophen  1,000 mg Intravenous Once  . DISCONTD: chlorhexidine  60 mL Topical Q2000  . DISCONTD: chlorhexidine  60 mL Topical Once  . DISCONTD: insulin glargine  10 Units Subcutaneous QHS  . DISCONTD: ketorolac  7.5 mg Intravenous Q6H  . DISCONTD: multivitamin  1 tablet Oral Daily  . DISCONTD: PARoxetine  10-20 mg Oral BID   Continuous Infusions:   . sodium chloride 75 mL/hr at 10/27/11 0200  . DISCONTD: sodium chloride     PRN Meds:.alum & mag hydroxide-simeth, bisacodyl, levalbuterol, magnesium hydroxide, menthol-cetylpyridinium, methocarbamol (ROBAXIN) IV, methocarbamol, metoCLOPramide (REGLAN) injection, metoCLOPramide, ondansetron (ZOFRAN) IV, ondansetron, oxyCODONE, phenol,  sodium phosphate, DISCONTD: droperidol, DISCONTD:  morphine injection  Assessment/Plan: Diabetes mellitus type 2 Hemoglobin A1C 6.6, suggesting average blood sugar of 143.  Continue Liraglutide. Continue Lantus 15 units of Lantus at bedtime, increase the dose to 17 units qhs, home dose 20 units qhs.  Continue NovoLog subcutaneous with meals.   Anemia Likely due to acute blood loss from recent surgery.  Patient is one unit of PRBC.  Acute renal failure on chronic kidney disease stage III Etiology unclear.  May be due to recent surgery monitor.  If renal function does not improve in the next few days consider further workup.  History of coronary artery disease-without active ischemia Continue beta blocker and statin.  Hypertension Continue calcium channel blocker and beta blocker.  Stable.  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  Mild hyperkalemia Presumed to be due to acute renal failure on top of chronic kidney disease.  Continue to monitor, recheck BMET today.  Not on any obvious medications that would cause elevation in potassium.  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  Status post I&D and revision of left knee on 10/26/2011.  Prophylaxis Rivaroxaban.  We will followup. Thank you for the consultation.    LOS: 1 day  Janalynn Eder A, MD 10/27/2011, 11:25 AM

## 2011-10-27 NOTE — Progress Notes (Signed)
Physical Therapy Evaluation Patient Details Name: [name] MRN: [id number] DOB: 28-Mar-1941 Today's Date: 10/27/2011 Time: 4098-1191 PT Time Calculation (min): 35 min  PT Assessment / Plan / Recommendation Clinical Impression  71 yo male s/p L TKA after antibiotic spacer had been implanted since May; Presents with decr functional mobility; Will benefit from Pt to maximize independence and safety with mobility, facilitate dc planning    PT Assessment  Patient needs continued PT services    Follow Up Recommendations  Skilled nursing facility    Barriers to Discharge   Plans for dc to snf    Equipment Recommendations  Defer to next venue    Recommendations for Other Services     Frequency 7X/week    Precautions / Restrictions Precautions Precautions: Knee Restrictions LLE Weight Bearing: Weight bearing as tolerated   Pertinent Vitals/Pain 4/10 knee pain; was premedicated      Mobility  Bed Mobility Bed Mobility: Supine to Sit;Sitting - Scoot to Edge of Bed Supine to Sit: 4: Min assist;With rails Sitting - Scoot to [place] of Bed: 4: Min assist;With rail Details for Bed Mobility Assistance: Cues and close guard for safety; Moved pretty well Transfers Transfers: Sit to Stand;Stand to Sit Sit to Stand: 4: Min assist;From bed Stand to Sit: 4: Min assist;To chair/3-in-1;With upper extremity assist Details for Transfer Assistance: Cues for safety, hand placement; Noted some decr control of stand to sit Ambulation/Gait Ambulation/Gait Assistance: 1: +2 Total assist Ambulation/Gait: Patient Percentage: 70% Ambulation Distance (Feet): 5 Feet Assistive device: Rolling walker Ambulation/Gait Assistance Details: Cues for gait sequence, and to activate L quad for stance stability; Pt hesitant to put full weight on LLE, despite education that he is WBAT -- tends to keep L knee slightly bent in stance Gait Pattern: Step-to pattern;Decreased stance time - left    Exercises Total  Joint Exercises Ankle Circles/Pumps: AROM;Both;20 reps Quad Sets: AROM;Left;10 reps Heel Slides: AROM;Left;5 reps (Hesitant to bend, states MD told him not to)   PT Diagnosis: Difficulty walking;Abnormality of gait;Acute pain  PT Problem List: Decreased strength;Decreased range of motion;Decreased activity tolerance;Decreased balance;Decreased mobility;Decreased knowledge of use of DME;Decreased cognition;Pain;Decreased knowledge of precautions PT Treatment Interventions: DME instruction;Gait training;Functional mobility training;Therapeutic activities;Therapeutic exercise;Neuromuscular re-education;Balance training;Patient/family education   PT Goals Acute Rehab PT Goals PT Goal Formulation: With patient Time For Goal Achievement: 11/03/11 Potential to Achieve Goals: Good Pt will go Supine/Side to Sit: with supervision PT Goal: Supine/Side to Sit - Progress: Goal set today Pt will go Sit to Supine/Side: with supervision PT Goal: Sit to Supine/Side - Progress: Goal set today Pt will go Sit to Stand: with supervision PT Goal: Sit to Stand - Progress: Goal set today Pt will go Stand to Sit: with supervision PT Goal: Stand to Sit - Progress: Goal set today Pt will Ambulate: >150 feet;with supervision;with rolling walker PT Goal: Ambulate - Progress: Goal set today Pt will Perform Home Exercise Program: with supervision, verbal cues required/provided PT Goal: Perform Home Exercise Program - Progress: Goal set today  Visit Information  Last PT Received On: 10/27/11 Assistance Needed: +1 (helpful)    Subjective Data  Subjective: Pt very motivated  Patient Stated Goal: get better   Prior Functioning  Home Living Lives With: Spouse Available Help at Discharge: Family;Available PRN/intermittently Type of Home: House Additional Comments: Plan for SNF for rehab prior to dc home Prior Function Level of Independence: Independent with assistive device(s) Communication Communication: No  difficulties (tangential conversation)    Cognition  Overall Cognitive Status:  (  Very easily distractable) Arousal/Alertness: Awake/alert Orientation Level: Appears intact for tasks assessed Behavior During Session: [facility] for tasks performed Cognition - Other Comments: Very distractabl enad tangential in conversation    Extremity/Trunk Assessment Right Upper Extremity Assessment RUE ROM/Strength/Tone: Within functional levels Left Upper Extremity Assessment LUE ROM/Strength/Tone: Within functional levels Right Lower Extremity Assessment RLE ROM/Strength/Tone: Within functional levels Left Lower Extremity Assessment LLE ROM/Strength/Tone: Deficits;Due to pain LLE ROM/Strength/Tone Deficits: Decr AROM and strength, limited by pain postop   Balance    End of Session PT - End of Session Equipment Utilized During Treatment: Gait belt Activity Tolerance: Patient limited by fatigue Patient left: in chair;with call bell/phone within reach Nurse Communication: Mobility status CPM Left Knee CPM Left Knee: On Left Knee Flexion (Degrees): [address]   GP     Olen Pel [place], [place] [phone]  10/27/2011, 3:14 PM

## 2011-10-28 LAB — CBC WITH DIFFERENTIAL/PLATELET
Basophils Absolute: 0 10*3/uL (ref 0.0–0.1)
Basophils Relative: 0 % (ref 0–1)
Lymphocytes Relative: 19 % (ref 12–46)
MCHC: 33.6 g/dL (ref 30.0–36.0)
Neutro Abs: 4.1 10*3/uL (ref 1.7–7.7)
Neutrophils Relative %: 62 % (ref 43–77)
RDW: 16 % — ABNORMAL HIGH (ref 11.5–15.5)
WBC: 6.7 10*3/uL (ref 4.0–10.5)

## 2011-10-28 LAB — CBC
MCH: 27.9 pg (ref 26.0–34.0)
MCHC: 33.5 g/dL (ref 30.0–36.0)
RDW: 16 % — ABNORMAL HIGH (ref 11.5–15.5)

## 2011-10-28 LAB — BASIC METABOLIC PANEL
Calcium: 9.5 mg/dL (ref 8.4–10.5)
GFR calc Af Amer: 38 mL/min — ABNORMAL LOW (ref >90)
GFR calc non Af Amer: 33 mL/min — ABNORMAL LOW (ref >90)
Potassium: 4.6 mEq/L (ref 3.5–5.1)
Sodium: 135 mEq/L (ref 135–145)

## 2011-10-28 LAB — GLUCOSE, CAPILLARY
Glucose-Capillary: 173 mg/dL — ABNORMAL HIGH (ref 70–99)
Glucose-Capillary: 143 mg/dL — ABNORMAL HIGH (ref 70–99)
Glucose-Capillary: 174 mg/dL — ABNORMAL HIGH (ref 70–99)

## 2011-10-28 LAB — PREPARE RBC (CROSSMATCH)

## 2011-10-28 MED ORDER — INSULIN GLARGINE 100 UNIT/ML ~~LOC~~ SOLN
20.0000 [IU] | Freq: Every day | SUBCUTANEOUS | Status: DC
  Administered 2011-10-28: 20 [IU] via SUBCUTANEOUS

## 2011-10-28 MED ORDER — POLYSACCHARIDE IRON COMPLEX 150 MG PO CAPS
150.0000 mg | ORAL_CAPSULE | Freq: Every day | ORAL | Status: DC
  Administered 2011-10-28 – 2011-10-29 (×2): 150 mg via ORAL
  Filled 2011-10-28 (×2): qty 1

## 2011-10-28 NOTE — Progress Notes (Addendum)
Subjective: No specific concerns.  Reading his newspaper in bed.  Objective: Vital signs in last 24 hours: Filed Vitals:   10/27/11 1200 10/27/11 1230 10/27/11 2225 10/28/11 0559  BP: 124/38 121/53 131/65 149/79  Pulse: 62 84 92 78  Temp: 98.2 F (36.8 C) 98 F (36.7 C) 98.7 F (37.1 C) 98.1 F (36.7 C)  TempSrc:      Resp: 20 18 16 18   Height:      Weight:      SpO2: 96%  91% 96%   Weight change:   Intake/Output Summary (Last 24 hours) at 10/28/11 1034 Last data filed at 10/28/11 6578  Gross per 24 hour  Intake   1000 ml  Output   1075 ml  Net    -75 ml    Physical Exam: General: Awake, Oriented, No acute distress. HEENT: EOMI. Neck: Supple CV: S1 and S2 Lungs: Clear to ascultation bilaterally Abdomen: Soft, Nontender, Nondistended, +bowel sounds. Ext: Good pulses. Trace edema.  Left knee in bandages.  Lab Results: Basic Metabolic Panel:  Lab 10/28/11 4696 10/27/11 1532 10/27/11 0630 10/26/11 0620  NA 135 135 138 137  K 4.6 5.0 5.4* 5.4*  CL 104 103 104 104  CO2 21 23 22 22   GLUCOSE 183* 159* 181* 203*  BUN 26* 31* 32* 28*  CREATININE 1.92* 2.33* 2.43* 1.64*  CALCIUM 9.5 9.2 8.9 10.0  MG -- -- -- --  PHOS -- -- -- --   Liver Function Tests: No results found for this basename: AST:5,ALT:5,ALKPHOS:5,BILITOT:5,PROT:5,ALBUMIN:5 in the last 168 hours No results found for this basename: LIPASE:5,AMYLASE:5 in the last 168 hours No results found for this basename: AMMONIA:5 in the last 168 hours CBC:  Lab 10/28/11 0633 10/27/11 0630  WBC 6.3 5.7  NEUTROABS -- --  HGB 8.1* 7.9*  HCT 24.2* 23.7*  MCV 83.4 84.9  PLT PLATELET CLUMPS NOTED ON SMEAR, COUNT APPEARS DECREASED 122*   Cardiac Enzymes: No results found for this basename: CKTOTAL:5,CKMB:5,CKMBINDEX:5,TROPONINI:5 in the last 168 hours BNP (last 3 results)  Basename 06/15/11 0500  PROBNP 10540.0*   CBG:  Lab 10/28/11 0711 10/27/11 2223 10/27/11 1602 10/27/11 1056 10/27/11 0709  GLUCAP 173*  160* 135* 204* 174*    Basename 10/26/11 1322  HGBA1C 6.6*   Other Labs: No components found with this basename: POCBNP:3 No results found for this basename: DDIMER:2 in the last 168 hours No results found for this basename: CHOL:2,HDL:2,LDLCALC:2,TRIG:2,CHOLHDL:2,LDLDIRECT:2 in the last 168 hours No results found for this basename: TSH,T4TOTAL,FREET3,T3FREE,FREET4,THYROIDAB in the last 168 hours No results found for this basename: VITAMINB12:2,FOLATE:2,FERRITIN:2,TIBC:2,IRON:2,RETICCTPCT:2 in the last 168 hours  Micro Results: Recent Results (from the past 240 hour(s))  SURGICAL PCR SCREEN     Status: Abnormal   Collection Time   10/20/11 10:25 AM      Component Value Range Status Comment   MRSA, PCR NEGATIVE  NEGATIVE Final    Staphylococcus aureus POSITIVE (*) NEGATIVE Final   URINE CULTURE     Status: Normal   Collection Time   10/20/11 10:26 AM      Component Value Range Status Comment   Specimen Description URINE, CLEAN CATCH   Final    Special Requests NONE   Final    Culture  Setup Time 10/20/2011 15:03   Final    Colony Count NO GROWTH   Final    Culture NO GROWTH   Final    Report Status 10/21/2011 FINAL   Final   TISSUE CULTURE  Status: Normal (Preliminary result)   Collection Time   10/26/11  8:19 AM      Component Value Range Status Comment   Specimen Description TISSUE KNEE LEFT   Final    Special Requests SYNOVIUM LEFT KNEE NO 1   Final    Gram Stain     Final    Value: NO WBC SEEN     NO SQUAMOUS EPITHELIAL CELLS SEEN     NO ORGANISMS SEEN   Culture NO GROWTH 2 DAYS   Final    Report Status PENDING   Incomplete   ANAEROBIC CULTURE     Status: Normal (Preliminary result)   Collection Time   10/26/11  8:19 AM      Component Value Range Status Comment   Specimen Description TISSUE KNEE LEFT   Final    Special Requests SYNOVIUM LEFT KNEE NO 1   Final    Gram Stain PENDING   Incomplete    Culture     Final    Value: NO ANAEROBES ISOLATED; CULTURE IN  PROGRESS FOR 5 DAYS   Report Status PENDING   Incomplete   BODY FLUID CULTURE     Status: Normal (Preliminary result)   Collection Time   10/26/11  8:22 AM      Component Value Range Status Comment   Specimen Description SYNOVIAL FLUID LEFT KNEE   Final    Special Requests FLUID ON SWAB SAMPLE NO 2   Final    Gram Stain     Final    Value: NO WBC SEEN     NO ORGANISMS SEEN   Culture NO GROWTH 1 DAY   Final    Report Status PENDING   Incomplete   ANAEROBIC CULTURE     Status: Normal (Preliminary result)   Collection Time   10/26/11  8:22 AM      Component Value Range Status Comment   Specimen Description SYNOVIAL FLUID LEFT KNEE   Final    Special Requests FLUID ON SWAB SAMPLE NO 2   Final    Gram Stain PENDING   Incomplete    Culture     Final    Value: NO ANAEROBES ISOLATED; CULTURE IN PROGRESS FOR 5 DAYS   Report Status PENDING   Incomplete   ANAEROBIC CULTURE     Status: Normal (Preliminary result)   Collection Time   10/26/11  8:23 AM      Component Value Range Status Comment   Specimen Description TISSUE KNEE LEFT   Final    Special Requests TIBIAL TISSUE LEFT KNEE SAMPLE NO 3   Final    Gram Stain PENDING   Incomplete    Culture     Final    Value: NO ANAEROBES ISOLATED; CULTURE IN PROGRESS FOR 5 DAYS   Report Status PENDING   Incomplete   TISSUE CULTURE     Status: Normal (Preliminary result)   Collection Time   10/26/11  8:23 AM      Component Value Range Status Comment   Specimen Description TISSUE KNEE LEFT   Final    Special Requests TIBIAL TISSUE LEFT KNEE SAMPLE NO 3   Final    Gram Stain     Final    Value: NO WBC SEEN     RARE SQUAMOUS EPITHELIAL CELLS PRESENT     NO ORGANISMS SEEN   Culture NO GROWTH 2 DAYS   Final    Report Status PENDING   Incomplete  Studies/Results: No results found.  Medications: I have reviewed the patient's current medications. Scheduled Meds:    . atorvastatin  40 mg Oral q1800  . diltiazem  120 mg Oral Once  . diltiazem   240 mg Oral Daily  . docusate sodium  100 mg Oral BID  . insulin aspart  0-15 Units Subcutaneous TID WC  . insulin aspart  0-5 Units Subcutaneous QHS  . insulin aspart  5 Units Subcutaneous TID WC  . insulin glargine  17 Units Subcutaneous QHS  . Liraglutide  1.2 mg Subcutaneous q morning - 10a  . metoprolol tartrate  12.5 mg Oral Once  . metoprolol tartrate  25 mg Oral BID  . multivitamin with minerals  1 tablet Oral Daily  . omega-3 acid ethyl esters  4 g Oral Daily  . pantoprazole  40 mg Oral Q1200  . PARoxetine  10 mg Oral QHS  . PARoxetine  20 mg Oral Daily  . rivaroxaban  10 mg Oral QHS  . DISCONTD: insulin aspart  3 Units Subcutaneous TID WC  . DISCONTD: insulin glargine  15 Units Subcutaneous QHS   Continuous Infusions:    . sodium chloride 75 mL/hr at 10/27/11 0200   PRN Meds:.alum & mag hydroxide-simeth, bisacodyl, levalbuterol, magnesium hydroxide, menthol-cetylpyridinium, methocarbamol (ROBAXIN) IV, methocarbamol, metoCLOPramide (REGLAN) injection, metoCLOPramide, ondansetron (ZOFRAN) IV, ondansetron, oxyCODONE, phenol  Assessment/Plan: Diabetes mellitus type 2 Hemoglobin A1C 6.6, suggesting average blood sugar of 143. Continue Liraglutide. Increase Lantus to home dose of 20 units qhs.  Continue NovoLog 5 units subcutaneous with meals.   Anemia Likely due to acute blood loss from recent surgery.  Patient given one unit of PRBC on 10/27/2011, hemoglobin 8.1. Start supplemental iron.  Acute renal failure on chronic kidney disease stage III Etiology unclear, improved to 1.92 from 2.33 yesterday.  May be due to recent surgery/anesthesia.  Baseline renal function 1.36-1.64. Continue to monitor.  History of coronary artery disease-without active ischemia Continue beta blocker and statin.  Hypertension Continue calcium channel blocker and beta blocker.  Stable.  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  Mild hyperkalemia Presumed to be due to acute renal failure on top of chronic kidney disease.  Resolved.  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  Status post I&D and revision of left knee on 10/26/2011. Given previous involvement with ID, Dr. Orvan Falconer, reviewed patient's chart, Dr. Orvan Falconer believed he was cured and cleared him for revision arthroplasty by Dr. Cleophas Dunker. So at this point he does not need any further antibiotic therapy.  Prophylaxis Rivaroxaban.  We will continue to follow. Thank you for the consultation.    LOS: 2 days  Delmas Faucett A, MD 10/28/2011, 10:33 AM

## 2011-10-28 NOTE — Progress Notes (Signed)
Physical Therapy Treatment Patient Details Name: [name] MRN: [id number] DOB: 1941/03/13 Today's Date: 10/28/2011 Time: 4098-1191 PT Time Calculation (min): 28 min  PT Assessment / Plan / Recommendation Comments on Treatment Session  Pt gait and mobility improving.  Pt had difficulty urinating in sitting .  Able to urinate in standing.      Follow Up Recommendations  Skilled nursing facility    Barriers to Discharge        Equipment Recommendations  Defer to next venue    Recommendations for Other Services    Frequency 7X/week   Plan Discharge plan remains appropriate;Frequency remains appropriate    Precautions / Restrictions Precautions Precautions: Knee Restrictions Weight Bearing Restrictions: Yes LLE Weight Bearing: Weight bearing as tolerated   Pertinent Vitals/Pain 4-5/10 L knee RN notified.      Mobility  Bed Mobility Bed Mobility: Supine to Sit;Sitting - Scoot to Edge of Bed Supine to Sit: 4: Min assist;With rails Supine to Sit: Patient Percentage: 90% Sitting - Scoot to Edge of Bed: 4: Min assist;With rail Details for Bed Mobility Assistance: Cues for technique assist for L LE  Transfers Transfers: Sit to Stand;Stand to [employer] Transfers Sit to Stand: 4: Min assist;From bed;With upper extremity assist;From elevated surface (3 trials.  ) Stand to Sit: 4: Min assist;To chair/3-in-1;With upper extremity assist Stand Pivot Transfers: 4: Min guard Details for Transfer Assistance: Assist to stand secondary to UE and R LE weakness.  Pt required repeated cueing for hand placement (each trial). Verbal cueing and assist to position L LE and hand placement to sit. Pt required assist to control descent to bed and chair.  Ambulation/Gait Ambulation/Gait Assistance: 4: Min assist Ambulation/Gait: Patient Percentage: 80% Ambulation Distance (Feet): 40 Feet Assistive device: Rolling walker Ambulation/Gait Assistance Details: Repeated cueing for gait  sequencing. Pt initially unsteady.  Stability improved after pt cued to increase weigh bearing in L LE.  Pt able to tolerate majority of wt in L LE without knee buckling.   Gait Pattern: Step-to pattern;Decreased stance time - left    Exercises Total Joint Exercises Ankle Circles/Pumps: AROM;Both;20 reps Quad Sets: AROM;Left;10 reps Goniometric ROM: 10 dergees short of full extension to 90 degrees of flexion.    PT Diagnosis:    PT Problem List:   PT Treatment Interventions:     PT Goals Acute Rehab PT Goals PT Goal Formulation: With patient Time For Goal Achievement: 11/03/11 Potential to Achieve Goals: Good Pt will go Supine/Side to Sit: with supervision PT Goal: Supine/Side to Sit - Progress: Progressing toward goal Pt will go Sit to Supine/Side: with supervision PT Goal: Sit to Supine/Side - Progress: Progressing toward goal Pt will go Sit to Stand: with supervision PT Goal: Sit to Stand - Progress: Progressing toward goal Pt will go Stand to Sit: with supervision PT Goal: Stand to Sit - Progress: Progressing toward goal Pt will Ambulate: >150 feet;with supervision;with rolling walker PT Goal: Ambulate - Progress: Progressing toward goal Pt will Perform Home Exercise Program: with supervision, verbal cues required/provided PT Goal: Perform Home Exercise Program - Progress: Progressing toward goal  Visit Information  Last PT Received On: 10/28/11 Assistance Needed: +1    Subjective Data  Subjective: Pt very motivated  Patient Stated Goal: get better   Cognition  Overall Cognitive Status: Appears within functional limits for tasks assessed/performed Arousal/Alertness: Lethargic Orientation Level: Appears intact for tasks assessed Behavior During Session: [facility] for tasks performed    Balance  Balance Balance Assessed: Yes Static Standing  Balance Static Standing - Balance Support: Bilateral upper extremity supported Static Standing - Level of Assistance: 4: Min  assist Static Standing - Comment/# of Minutes: Pt initially leaning against bed in standing.  Cued pt to shift his weight forward L Knee buckle due to pt lifting heel from floor and bearing weight through his L forefoot.  Instucted pt to wt bear through L heel.  Pt presents with posterior lean in static standing not present in dynamic standitng/ ambulating.    End of Session PT - End of Session Equipment Utilized During Treatment: Gait belt Activity Tolerance: Patient limited by fatigue Patient left: in chair;with call bell/phone within reach Nurse Communication: Mobility status;Other (comment) (Hemovac disconnected during session. )   GP     [name] 10/28/2011, 2:44 PM Sania Noy L. Anjolina Byrer DPT [phone]

## 2011-10-28 NOTE — Progress Notes (Signed)
Occupational Therapy Note Order noted. Chart reviewed and see pt is planning for SNF, possibly within a day or so. Will defer OT to SNF.  Judithann Sauger OTR/L [phone] 10/28/2011

## 2011-10-28 NOTE — Progress Notes (Signed)
Patient ID: [name], male   DOB: 06/26/1940, 71 y.o.   MRN: [id number] PATIENT ID: [name]        MRN:  [id number]          DOB/AGE: February 13, 1941 / 71 y.o.  Logan Campbell, MD   Logan Code, PA-C [address] [place], [place], [place]  [address]                             [phone]   PROGRESS NOTE  Subjective:  negative for Chest Pain  negative for Shortness of Breath  negative for Nausea/Vomiting   negative for Calf Pain  positive for Bowel Movement   Tolerating Diet: yes         Patient reports pain as mild.     Wife states  He is a little confused but much better than his last hospitalization  Objective: Vital signs in last 24 hours:   Patient Vitals for the past 24 hrs:  BP Temp Pulse Resp SpO2  10/28/11 0559 149/79 mmHg 98.1 F (36.7 C) 78  18  96 %  10/27/11 2225 131/65 mmHg 98.7 F (37.1 C) 92  16  91 %      Intake/Output from previous day:   07/24 0701 - 07/25 0700 In: 880 [P.O.:480; I.V.:50] Out: 1105 [Urine:950; Drains:155]   Intake/Output this shift:   07/25 0701 - 07/25 1900 In: 360 [P.O.:360] Out: 300 [Urine:300]   Intake/Output      07/24 0701 - 07/25 0700 07/25 0701 - 07/26 0700   P.O. 480 360   I.V. (mL/kg) 50 (0.5)    Blood 350    IV Piggyback     Total Intake(mL/kg) 880 (8.3) 360 (3.4)   Urine (mL/kg/hr) 950 (0.4) 300 (0.4)   Drains 155    Blood     Total Output 1105 300   Net -225 +60           LABORATORY DATA:  Basename 10/28/11 0633 10/27/11 0630  WBC 6.3 5.7  HGB 8.1* 7.9*  HCT 24.2* 23.7*  PLT PLATELET CLUMPS NOTED ON SMEAR, COUNT APPEARS DECREASED 122*    Basename 10/28/11 0633 10/27/11 1532 10/27/11 0630 10/26/11 0620  NA 135 135 138 137  K 4.6 5.0 5.4* 5.4*  CL 104 103 104 104  CO2 21 23 22 22   BUN 26* 31* 32* 28*  CREATININE 1.92* 2.33* 2.43* 1.64*  GLUCOSE 183* 159* 181* 203*  CALCIUM 9.5 9.2 8.9 10.0   Lab Results  Component Value Date   INR 1.04 10/20/2011   INR 1.10 08/10/2011   INR 1.19  06/12/2011    Examination:  General appearance: alert, cooperative, mild distress and slowed mentation Resp: clear to auscultation bilaterally Cardio: regular rate and rhythm GI: normal findings: bowel sounds normal  Wound Exam: clean, dry, intact   Drainage:  None: wound tissue dry  Motor Exam: EHL, FHL, Anterior Tibial and Posterior Tibial Intact  Sensory Exam: Superficial Peroneal, Deep Peroneal and Tibial normal  Vascular Exam: Right dorsalis pedis artery has 1+ (weak) pulse  Assessment:    2 Days Post-Op  Procedure(s) (LRB): TOTAL KNEE REVISION (Left)  ADDITIONAL DIAGNOSIS:  Active Problems:  DM (diabetes mellitus), type 2 with complications  HYPERCHOLESTEROLEMIA  IIA  GOUT  ANEMIA, NORMOCYTIC, CHRONIC  DEPRESSION  HYPERTENSION, UNSPECIFIED  CAD, UNSPECIFIED SITE  COLITIS, ULCERATIVE NOS  Infection of prosthetic left knee joint  CKD (chronic kidney disease) stage  3, GFR 30-59 ml/min  Staphylococcus aureus bacteremia  Acute renal failure  Acute Blood Loss Anemia  Mild confusion Platelet clumping  With decreased number.     Plan: Physical Therapy as ordered Weight Bearing as Tolerated (WBAT)  DVT Prophylaxis:  Xarelto, Foot Pumps and TED hose  DISCHARGE PLAN: Skilled Nursing Facility/Rehab to [facility] tomorrow  DISCHARGE NEEDS: has at home  Give another unit of PRBS'c today.  Hopefully will help confusion also as well as anemia.       [name] 10/28/2011, 1:34 PM

## 2011-10-29 DIAGNOSIS — I10 Essential (primary) hypertension: Secondary | ICD-10-CM

## 2011-10-29 DIAGNOSIS — Z96659 Presence of unspecified artificial knee joint: Secondary | ICD-10-CM

## 2011-10-29 LAB — BASIC METABOLIC PANEL
Calcium: 9.6 mg/dL (ref 8.4–10.5)
GFR calc Af Amer: 44 mL/min — ABNORMAL LOW (ref >90)
GFR calc non Af Amer: 38 mL/min — ABNORMAL LOW (ref >90)
Glucose, Bld: 167 mg/dL — ABNORMAL HIGH (ref 70–99)
Potassium: 4.5 mEq/L (ref 3.5–5.1)
Sodium: 138 mEq/L (ref 135–145)

## 2011-10-29 LAB — ANAEROBIC CULTURE: Gram Stain: NONE SEEN

## 2011-10-29 LAB — BODY FLUID CULTURE
Culture: NO GROWTH
Gram Stain: NONE SEEN

## 2011-10-29 LAB — TISSUE CULTURE
Culture: NO GROWTH
Gram Stain: NONE SEEN

## 2011-10-29 LAB — TYPE AND SCREEN
ABO/RH(D): A NEG
Antibody Screen: NEGATIVE
Unit division: 0

## 2011-10-29 LAB — CBC
MCH: 28.7 pg (ref 26.0–34.0)
MCHC: 34.2 g/dL (ref 30.0–36.0)
Platelets: 120 10*3/uL — ABNORMAL LOW (ref 150–400)
RDW: 15.6 % — ABNORMAL HIGH (ref 11.5–15.5)

## 2011-10-29 MED ORDER — POLYSACCHARIDE IRON COMPLEX 150 MG PO CAPS: 150.0000 mg | ORAL_CAPSULE | Freq: Every day | ORAL | Status: DC

## 2011-10-29 MED ORDER — INSULIN ASPART 100 UNIT/ML ~~LOC~~ SOLN: 5.0000 [IU] | Freq: Three times a day (TID) | SUBCUTANEOUS | Status: AC

## 2011-10-29 MED ORDER — METHOCARBAMOL 500 MG PO TABS: 500.0000 mg | ORAL_TABLET | Freq: Three times a day (TID) | ORAL | Status: AC | PRN

## 2011-10-29 MED ORDER — OXYCODONE HCL 5 MG PO TABS: 5.0000 mg | ORAL_TABLET | ORAL | Status: AC | PRN

## 2011-10-29 MED ORDER — RIVAROXABAN 10 MG PO TABS: 10.0000 mg | ORAL_TABLET | Freq: Every day | ORAL | Status: DC

## 2011-10-29 MED ORDER — INSULIN GLARGINE 100 UNIT/ML ~~LOC~~ SOLN: 20.0000 [IU] | Freq: Every day | SUBCUTANEOUS | Status: AC

## 2011-10-29 MED ORDER — LIRAGLUTIDE 18 MG/3ML ~~LOC~~ SOLN: 1.2000 mg | Freq: Every morning | SUBCUTANEOUS | Status: DC

## 2011-10-29 NOTE — Progress Notes (Signed)
Subjective: Feeling better. No specific concerns.  Objective: Vital signs in last 24 hours: Filed Vitals:   10/28/11 1800 10/28/11 1900 10/28/11 2140 10/29/11 0629  BP: 118/56 130/64 139/57 155/70  Pulse: 72 66 64 70  Temp: 98.3 F (36.8 C) 98.1 F (36.7 C) 98.4 F (36.9 C) 97.6 F (36.4 C)  TempSrc:      Resp: 18 16 16 16   Height:      Weight:      SpO2:   96% 97%   Weight change:   Intake/Output Summary (Last 24 hours) at 10/29/11 0946 Last data filed at 10/28/11 1915  Gross per 24 hour  Intake    415 ml  Output    300 ml  Net    115 ml    Physical Exam: General: Awake, Oriented, No acute distress. HEENT: EOMI. Neck: Supple CV: S1 and S2 Lungs: Clear to ascultation bilaterally Abdomen: Soft, Nontender, Nondistended, +bowel sounds. Ext: Good pulses. Trace edema.  Left knee in bandages.  Lab Results: Basic Metabolic Panel:  Lab 10/29/11 1610 10/28/11 9604 10/27/11 1532 10/27/11 0630 10/26/11 0620  NA 138 135 135 138 137  K 4.5 4.6 5.0 5.4* 5.4*  CL 105 104 103 104 104  CO2 23 21 23 22 22   GLUCOSE 167* 183* 159* 181* 203*  BUN 23 26* 31* 32* 28*  CREATININE 1.71* 1.92* 2.33* 2.43* 1.64*  CALCIUM 9.6 9.5 9.2 8.9 10.0  MG -- -- -- -- --  PHOS -- -- -- -- --   Liver Function Tests: No results found for this basename: AST:5,ALT:5,ALKPHOS:5,BILITOT:5,PROT:5,ALBUMIN:5 in the last 168 hours No results found for this basename: LIPASE:5,AMYLASE:5 in the last 168 hours No results found for this basename: AMMONIA:5 in the last 168 hours CBC:  Lab 10/29/11 0530 10/28/11 1331 10/28/11 0633 10/27/11 0630  WBC 5.8 6.7 6.3 5.7  NEUTROABS -- 4.1 -- --  HGB 8.8* 8.5* 8.1* 7.9*  HCT 25.7* 25.3* 24.2* 23.7*  MCV 83.7 83.8 83.4 84.9  PLT 120* 120* PLATELET CLUMPS NOTED ON SMEAR, COUNT APPEARS DECREASED 122*   Cardiac Enzymes: No results found for this basename: CKTOTAL:5,CKMB:5,CKMBINDEX:5,TROPONINI:5 in the last 168 hours BNP (last 3 results)  Basename 06/15/11 0500   PROBNP 10540.0*   CBG:  Lab 10/29/11 0645 10/28/11 2131 10/28/11 1637 10/28/11 1122 10/28/11 0711  GLUCAP 157* 174* 143* 202* 173*    Basename 10/26/11 1322  HGBA1C 6.6*   Other Labs: No components found with this basename: POCBNP:3 No results found for this basename: DDIMER:2 in the last 168 hours No results found for this basename: CHOL:2,HDL:2,LDLCALC:2,TRIG:2,CHOLHDL:2,LDLDIRECT:2 in the last 168 hours No results found for this basename: TSH,T4TOTAL,FREET3,T3FREE,FREET4,THYROIDAB in the last 168 hours No results found for this basename: VITAMINB12:2,FOLATE:2,FERRITIN:2,TIBC:2,IRON:2,RETICCTPCT:2 in the last 168 hours  Micro Results: Recent Results (from the past 240 hour(s))  SURGICAL PCR SCREEN     Status: Abnormal   Collection Time   10/20/11 10:25 AM      Component Value Range Status Comment   MRSA, PCR NEGATIVE  NEGATIVE Final    Staphylococcus aureus POSITIVE (*) NEGATIVE Final   URINE CULTURE     Status: Normal   Collection Time   10/20/11 10:26 AM      Component Value Range Status Comment   Specimen Description URINE, CLEAN CATCH   Final    Special Requests NONE   Final    Culture  Setup Time 10/20/2011 15:03   Final    Colony Count NO GROWTH   Final  Culture NO GROWTH   Final    Report Status 10/21/2011 FINAL   Final   TISSUE CULTURE     Status: Normal   Collection Time   10/26/11  8:19 AM      Component Value Range Status Comment   Specimen Description TISSUE KNEE LEFT   Final    Special Requests SYNOVIUM LEFT KNEE NO 1   Final    Gram Stain     Final    Value: NO WBC SEEN     NO SQUAMOUS EPITHELIAL CELLS SEEN     NO ORGANISMS SEEN   Culture NO GROWTH 3 DAYS   Final    Report Status 10/29/2011 FINAL   Final   ANAEROBIC CULTURE     Status: Normal (Preliminary result)   Collection Time   10/26/11  8:19 AM      Component Value Range Status Comment   Specimen Description TISSUE KNEE LEFT   Final    Special Requests SYNOVIUM LEFT KNEE NO 1   Final    Gram  Stain PENDING   Incomplete    Culture     Final    Value: NO ANAEROBES ISOLATED; CULTURE IN PROGRESS FOR 5 DAYS   Report Status PENDING   Incomplete   BODY FLUID CULTURE     Status: Normal (Preliminary result)   Collection Time   10/26/11  8:22 AM      Component Value Range Status Comment   Specimen Description SYNOVIAL FLUID LEFT KNEE   Final    Special Requests FLUID ON SWAB SAMPLE NO 2   Final    Gram Stain     Final    Value: NO WBC SEEN     NO ORGANISMS SEEN   Culture NO GROWTH 2 DAYS   Final    Report Status PENDING   Incomplete   ANAEROBIC CULTURE     Status: Normal (Preliminary result)   Collection Time   10/26/11  8:22 AM      Component Value Range Status Comment   Specimen Description SYNOVIAL FLUID LEFT KNEE   Final    Special Requests FLUID ON SWAB SAMPLE NO 2   Final    Gram Stain PENDING   Incomplete    Culture     Final    Value: NO ANAEROBES ISOLATED; CULTURE IN PROGRESS FOR 5 DAYS   Report Status PENDING   Incomplete   ANAEROBIC CULTURE     Status: Normal (Preliminary result)   Collection Time   10/26/11  8:23 AM      Component Value Range Status Comment   Specimen Description TISSUE KNEE LEFT   Final    Special Requests TIBIAL TISSUE LEFT KNEE SAMPLE NO 3   Final    Gram Stain PENDING   Incomplete    Culture     Final    Value: NO ANAEROBES ISOLATED; CULTURE IN PROGRESS FOR 5 DAYS   Report Status PENDING   Incomplete   TISSUE CULTURE     Status: Normal   Collection Time   10/26/11  8:23 AM      Component Value Range Status Comment   Specimen Description TISSUE KNEE LEFT   Final    Special Requests TIBIAL TISSUE LEFT KNEE SAMPLE NO 3   Final    Gram Stain     Final    Value: NO WBC SEEN     RARE SQUAMOUS EPITHELIAL CELLS PRESENT     NO ORGANISMS SEEN  Culture NO GROWTH 3 DAYS   Final    Report Status 10/29/2011 FINAL   Final     Studies/Results: No results found.  Medications: I have reviewed the patient's current medications. Scheduled Meds:    .  atorvastatin  40 mg Oral q1800  . diltiazem  240 mg Oral Daily  . docusate sodium  100 mg Oral BID  . insulin aspart  0-15 Units Subcutaneous TID WC  . insulin aspart  0-5 Units Subcutaneous QHS  . insulin aspart  5 Units Subcutaneous TID WC  . insulin glargine  20 Units Subcutaneous QHS  . iron polysaccharides  150 mg Oral Daily  . Liraglutide  1.2 mg Subcutaneous q morning - 10a  . metoprolol tartrate  25 mg Oral BID  . multivitamin with minerals  1 tablet Oral Daily  . omega-3 acid ethyl esters  4 g Oral Daily  . pantoprazole  40 mg Oral Q1200  . PARoxetine  10 mg Oral QHS  . PARoxetine  20 mg Oral Daily  . rivaroxaban  10 mg Oral QHS  . DISCONTD: insulin glargine  17 Units Subcutaneous QHS   Continuous Infusions:    . sodium chloride 75 mL/hr at 10/27/11 0200   PRN Meds:.alum & mag hydroxide-simeth, bisacodyl, levalbuterol, magnesium hydroxide, menthol-cetylpyridinium, methocarbamol (ROBAXIN) IV, methocarbamol, metoCLOPramide (REGLAN) injection, metoCLOPramide, ondansetron (ZOFRAN) IV, ondansetron, oxyCODONE, phenol  Assessment/Plan: Diabetes mellitus type 2 Hemoglobin A1C 6.6, suggesting average blood sugar of 143. Continue Liraglutide. Continue Lantus to home dose of 20 units qhs.  Continue NovoLog 5 units subcutaneous with meals.   Anemia Likely due to acute blood loss from recent surgery.  Patient given one unit of PRBC on 10/27/2011 and another unit of pRBC on 10/28/2011. Continue supplemental iron.  Acute renal failure on chronic kidney disease stage III Etiology unclear, improved to 1.71 from 2.33 yesterday.  May be due to recent surgery/anesthesia.  Baseline renal function 1.36-1.64. Continue to monitor.  History of coronary artery disease-without active ischemia Continue beta blocker and statin.  Hypertension Continue calcium channel blocker and beta blocker.  Stable.  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  Mild hyperkalemia Presumed to be due to acute renal failure on top of chronic kidney disease.  Resolved.  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  Status post I&D and revision of left knee on 10/26/2011.  Prophylaxis Rivaroxaban.   LOS: 3 days  [name] A, MD 10/29/2011, 9:46 AM

## 2011-10-29 NOTE — Clinical Social Work Psychosocial (Addendum)
    Clinical Social Work Department BRIEF PSYCHOSOCIAL ASSESSMENT 10/29/2011  Patient:  [name], [name]     Account Number:  [id number]     Admit date:  10/26/2011  Clinical Social Worker:  Burnard Hawthorne  Date/Time:  10/27/2011 02:00 PM  Referred by:  RN  Date Referred:  10/27/2011 Referred for  SNF Placement   Other Referral:   Interview type:  Other - See comment Other interview type:   Patient and wife    PSYCHOSOCIAL DATA Living Status:  WIFE Admitted from facility:   Level of care:   Primary support name:  Winfield Caba   [phone] Primary support relationship to patient:  SPOUSE Degree of support available:   Strong support    CURRENT CONCERNS Current Concerns  Post-Acute Placement   Other Concerns:    SOCIAL WORK ASSESSMENT / PLAN met with patient and his wife today to discuss referral for short term SNF.  Patinet has breen a resident of [facility] in the past and they would like to return there.Mrs. Fleagle stated that the doctors office "has already arranged for a bed at [facility].  Spoke with Jill Side at [facility]- referral will be initiated. She feels that she will have a bed for patient and is aware of his need for short term SNF. Fl2 initiated.   Assessment/plan status:  Psychosocial Support/Ongoing Assessment of Needs Other assessment/ plan:   Information/referral to community resources:   SNF list provided  Discussed after care issues for possible need of HH/DME to be arranged by SNF if needed.    PATIENT'S/FAMILY'S RESPONSE TO PLAN OF CARE: Patient and wife were very pleased with CSW invovlement and desire SNF placement at [facility].

## 2011-10-29 NOTE — Discharge Summary (Signed)
[name], MD   [name], PA-C [address] [place], [place], [place]  [address]                             [phone]  PATIENT ID: [name]        MRN:  [id number]          DOB/AGE: 1940/11/06 / 71 y.o.    DISCHARGE SUMMARY  ADMISSION DATE:    10/26/2011 DISCHARGE DATE:   10/29/2011   ADMISSION DIAGNOSIS:infected left total knee replacement    DISCHARGE DIAGNOSIS:  infected left total knee replacement    ADDITIONAL DIAGNOSIS: Active Problems:  DM (diabetes mellitus), type 2 with complications  HYPERCHOLESTEROLEMIA  IIA  GOUT  ANEMIA, NORMOCYTIC, CHRONIC  DEPRESSION  HYPERTENSION, UNSPECIFIED  CAD, UNSPECIFIED SITE  COLITIS, ULCERATIVE NOS  Infection of prosthetic left knee joint  CKD (chronic kidney disease) stage 3, GFR 30-59 ml/min  Staphylococcus aureus bacteremia  Acute renal failure  Past Medical History  Diagnosis Date  . Gout   . Depression   . Diabetes mellitus   . Unspecified essential hypertension   . Hypercholesteremia     IIA  . Colitis, ulcerative     NOS  . Personal hx-rectal/anal malignancy   . Urinary incontinence   . Benign prostatic hypertrophy     S/p TURP   . H/O ETOH abuse 06/06/2011  . 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 Pleurisy   . AAA (abdominal aortic aneurysm) 1992    S/p repair and grafting  . History of alcohol abuse   . CAD (coronary artery disease)   . Stroke     72 yrs old  . Stones in the urinary tract   . GERD (gastroesophageal reflux disease)   . Normocytic anemia     Chronic  . PAF (paroxysmal atrial fibrillation)     during hospitalization 06/2011 for sepsis  . Cardiomyopathy, ischemic     EF 40-45% by echo 06/2011  . Bladder cancer 2006  . Bladder cancer     Transitional Cell  . Colon cancer   . Chronic bronchitis     "used to get it quite often; found out medicine was causing it"  . Arthritis     "left knee"    PROCEDURE: Procedure(s): TOTAL  KNEE REVISION on 10/26/2011  CONSULTS:   [facility]  HISTORY: The patient is a very pleasant 71 year old white male who is seen today for follow-up evaluation of his left knee. On 06/12/11 he presented to the [facility] with significant pain and discomfort in his left knee. He had been hospitalized from 06/04/11 through 06/08/11 with chest pain and bronchitis. The chest pain was thought to be that of bronchitis. He was placed on steroids and discharged with a steroid taper on 06/08/11. Subsequently after that discharge on 06/09/11 the patient had developed left knee pain. He reported that his knee pain had been increasing and he went to see his primary care physician on 06/11/11 and had his left knee tapped. He had increasing pain in the left knee and also the ankle during the night of 06/11/11 and actually started having right knee pain. He presented to the hospital on 06/12/11 and was noted to have significant pain, discomfort, and swelling in the knee. He was afebrile at the time. However, because of his swelling it was felt that he did have an aspiration in  the hospital which revealed intracellular bacteria and Dr. Madelon Lips took him to the operating room on 06/12/11 where he performed incision, drainage, irrigation, and debridement of the left knee and a poly exchange. He was admitted to the hospital at that time and was placed on antibiotics. He was noted to have methicillin sensitive staph aureus in his blood as well as in his left knee. His right knee was clean. During his hospital stay we did do several aspirations of fluid from the knee and the subcutaneous areas. An I&D locally at the distal portion of the wound was performed and this was packed up until about two days ago. Since that time he has had antibiotics and has been followed by Dr. Bonnita Nasuti. He has had improvement in his pain, but he still continues to have significant swelling and pain in the knee.    HOSPITAL COURSE:   [name] is a 71 y.o. admitted on 10/26/2011 and found to have a diagnosis of infected left total knee replacement.  After appropriate laboratory studies were obtained  they were taken to the operating room on 10/26/2011 and underwent Procedure(s): TOTAL KNEE REVISION.   They were given perioperative antibiotics:  Anti-infectives     Start     Dose/Rate Route Frequency Ordered Stop   10/26/11 0930   vancomycin (VANCOCIN) powder 3,000 mg        3,000 mg Other To Surgery 10/26/11 0916 10/26/11 1006        .  Tolerated the procedure well.  Placed with a foley intraoperatively.  Given Ofirmev at induction and for 48 hours.    POD #1, allowed out of bed to a chair.  PT for ambulation and exercise program.  Foley D/C'd in morning.  IV KVO.  O2 discontionued. Given one unit PRBC's.   POD #2, continued PT and ambulation. Hemovac pulled. Given one unit PRBC's.  Some mild confusion according to wife but much better than his last hospitalization.  Felt to be more on the narcotics.  BUN/cret improving. Marland Kitchen POD #3, continued PT.  Wanted to be discharged.  The remainder of the hospital course was dedicated to ambulation and strengthening.   The patient was discharged on 3 Days Post-Op in  Stable condition.  Blood products given:2 units CC PRBC  DIAGNOSTIC STUDIES: Recent vital signs: Patient Vitals for the past 24 hrs:  BP Temp Pulse Resp SpO2  10/29/11 0629 155/70 mmHg 97.6 F (36.4 C) 70  16  97 %  2011-11-25 2140 139/57 mmHg 98.4 F (36.9 C) 64  16  96 %  Nov 25, 2011 1900 130/64 mmHg 98.1 F (36.7 C) 66  16  -  11/25/11 1800 118/56 mmHg 98.3 F (36.8 C) 72  18  -  11-25-11 1700 120/67 mmHg 98 F (36.7 C) 67  18  -  November 25, 2011 1550 115/50 mmHg 98.2 F (36.8 C) 68  16  -  11/25/2011 1515 110/54 mmHg 97.6 F (36.4 C) 72  16  -  Nov 25, 2011 1340 111/48 mmHg 97 F (36.1 C) 66  16  94 %  25-Nov-2011 1200 - - - 16  94 %       Recent laboratory studies:  Basename 10/29/11 0530 11/25/2011 1331  Nov 25, 2011 0633 10/27/11 0630  WBC 5.8 6.7 6.3 5.7  HGB 8.8* 8.5* 8.1* 7.9*  HCT 25.7* 25.3* 24.2* 23.7*  PLT 120* 120* PLATELET CLUMPS NOTED ON SMEAR, COUNT APPEARS DECREASED 122*    Basename 10/29/11 0530 November 25, 2011 0633 10/27/11 1532 10/27/11 0630 10/26/11  0620  NA 138 135 135 138 137  K 4.5 4.6 5.0 5.4* 5.4*  CL 105 104 103 104 104  CO2 23 21 23 22 22   BUN 23 26* 31* 32* 28*  CREATININE 1.71* 1.92* 2.33* 2.43* 1.64*  GLUCOSE 167* 183* 159* 181* 203*  CALCIUM 9.6 9.5 9.2 8.9 10.0   Lab Results  Component Value Date   INR 1.04 10/20/2011   INR 1.10 08/10/2011   INR 1.19 06/12/2011     Recent Radiographic Studies :   Chest 2 View  10/20/2011  *RADIOLOGY REPORT*  Clinical Data: Revision of the knee replacement, former smoking history  CHEST - 2 VIEW  Comparison: Portable chest x-ray of 06/18/2011  Findings: The lungs are clear and slightly hyperaerated.  There is some peribronchial thickening which may indicate bronchitis.  The heart is mildly enlarged and stable.  There are degenerative changes in the thoracic spine.  IMPRESSION:   No active lung disease.  Stable hyperaeration and probable chronic bronchitis.  Original Report Authenticated By: Juline Patch, M.D.    DISCHARGE INSTRUCTIONS: Discharge Orders    Future Appointments: Provider: Department: Dept Phone: Center:   11/08/2011 2:45 PM Cliffton Asters, MD [facility] [phone] [facility]     Future Orders Please Complete By Expires   Diet - low sodium heart healthy      Scheduling Instructions:   Diabetic diet   Call MD / Call 911      Comments:   If you experience chest pain or shortness of breath, CALL 911 and be transported to the hospital emergency room.  If you develope a fever above 101 F, pus (white drainage) or increased drainage or redness at the wound, or calf pain, call your surgeon's office.   Constipation Prevention      Comments:   Drink plenty of fluids.  Prune juice may be helpful.  You may use a stool  softener, such as Colace (over the counter) 100 mg twice a day.  Use MiraLax (over the counter) for constipation as needed.   Increase activity slowly as tolerated      Patient may shower      Comments:   You may shower without a dressing once there is no drainage.  Do not wash over the wound.  If drainage remains, cover wound with plastic wrap and then shower.   Weight bearing as tolerated      Driving restrictions      Comments:   No driving for 6 weeks   Lifting restrictions      Comments:   No lifting for 6 weeks   CPM      Comments:   Continuous passive motion machine (CPM):      Use the CPM from 0 to 60 for 8 hours per day.      Do not increase range, keep at 60 degrees till seen in follow up.  You may break it up into 2 or 3 sessions per day.      Use CPM for 4 weeks or until you are told to stop.   TED hose      Comments:   Use stockings (TED hose) for 3 weeks on operative leg(s).  You may remove them at night for sleeping.   Change dressing      Comments:   Change dressing on Saturday, then change the dressing daily with sterile 4 x 4 inch gauze dressing and apply TED hose.  You may clean the incision  with alcohol prior to redressing.   Do not put a pillow under the knee. Place it under the heel.                    DISCHARGE MEDICATIONS:   Medication List  As of 10/29/2011  8:12 AM   STOP taking these medications         HYDROcodone-acetaminophen 5-325 MG per tablet         TAKE these medications         diltiazem 240 MG 24 hr capsule   Commonly known as: DILACOR XR   Take 240 mg by mouth daily.      insulin aspart 100 UNIT/ML injection   Commonly known as: novoLOG   Inject 5 Units into the skin 3 (three) times daily with meals.      insulin glargine 100 UNIT/ML injection   Commonly known as: LANTUS   Inject 20 Units into the skin at bedtime.      iron polysaccharides 150 MG capsule   Commonly known as: NIFEREX   Take 1 capsule (150 mg total) by mouth  daily.      levalbuterol 0.63 MG/3ML nebulizer solution   Commonly known as: XOPENEX   Take 0.63 mg by nebulization every 6 (six) hours as needed. Shortness of breath      Liraglutide 18 MG/3ML Soln   Inject 0.2 mLs (1.2 mg total) into the skin every morning.      methocarbamol 500 MG tablet   Commonly known as: ROBAXIN   Take 1 tablet (500 mg total) by mouth every 8 (eight) hours as needed.      metoprolol tartrate 25 MG tablet   Commonly known as: LOPRESSOR   Take 25 mg by mouth 2 (two) times daily.      multivitamin tablet   Take 1 tablet by mouth daily.      omega-3 acid ethyl esters 1 G capsule   Commonly known as: LOVAZA   Take 4 g by mouth daily.      oxyCODONE 5 MG immediate release tablet   Commonly known as: Oxy IR/ROXICODONE   Take 1-2 tablets (5-10 mg total) by mouth every 4 (four) hours as needed.      pantoprazole 40 MG tablet   Commonly known as: PROTONIX   Take 40 mg by mouth daily at 12 noon.      PARoxetine 20 MG tablet   Commonly known as: PAXIL   Take 10-20 mg by mouth 2 (two) times daily. 1 tab in am, 0.5 tab in pm      rivaroxaban 10 MG Tabs tablet   Commonly known as: XARELTO   Take 1 tablet (10 mg total) by mouth at bedtime.      rosuvastatin 20 MG tablet   Commonly known as: CRESTOR   Take 20 mg by mouth at bedtime.            FOLLOW UP VISIT:   Follow-up Information    Follow up with [facility] on 11/10/2011.   Contact information:   [address] [place] [address] [phone]          DISPOSITION:  Skilled nursing facility    CONDITION:  Stable   PETRARCA,BRIAN 10/29/2011, 8:12 AM

## 2011-10-29 NOTE — Progress Notes (Signed)
Patient ID: [name], male   DOB: [birth year], 71 y.o.   MRN: [id number] PATIENT ID: [name]        MRN:  [id number]          DOB/AGE: October 24, 1940 / 71 y.o.  Logan Campbell, MD   Logan Code, PA-C [address] [place], [place], [place]  [address]                             [phone]   PROGRESS NOTE  Subjective:  negative for Chest Pain  negative for Shortness of Breath  negative for Nausea/Vomiting   negative for Calf Pain  positive for Bowel Movement   Tolerating Diet: yes         Patient reports pain as mild.     Doing well.  Feeling better.  No major complaints today.  Wants to go to [facility]  Objective: Vital signs in last 24 hours:   Patient Vitals for the past 24 hrs:  BP Temp Pulse Resp SpO2  10/29/11 0629 155/70 mmHg 97.6 F (36.4 C) 70  16  97 %  10/28/11 2140 139/57 mmHg 98.4 F (36.9 C) 64  16  96 %  10/28/11 1900 130/64 mmHg 98.1 F (36.7 C) 66  16  -  10/28/11 1800 118/56 mmHg 98.3 F (36.8 C) 72  18  -  10/28/11 1700 120/67 mmHg 98 F (36.7 C) 67  18  -  10/28/11 1550 115/50 mmHg 98.2 F (36.8 C) 68  16  -  10/28/11 1515 110/54 mmHg 97.6 F (36.4 C) 72  16  -  10/28/11 1340 111/48 mmHg 97 F (36.1 C) 66  16  94 %  10/28/11 1200 - - - 16  94 %  10/28/11 0800 - - - 18  93 %      Intake/Output from previous day:   07/25 0701 - 07/26 0700 In: 775 [P.O.:360; I.V.:50] Out: 300 [Urine:300]   Intake/Output this shift:       Intake/Output      07/25 0701 - 07/26 0700 07/26 0701 - 07/27 0700   P.O. 360    I.V. (mL/kg) 50 (0.5)    Blood 365    Total Intake(mL/kg) 775 (7.3)    Urine (mL/kg/hr) 300 (0.1)    Drains     Total Output 300    Net +475            LABORATORY DATA:  Basename 10/29/11 0530 10/28/11 1331 10/28/11 0633 10/27/11 0630  WBC 5.8 6.7 6.3 5.7  HGB 8.8* 8.5* 8.1* 7.9*  HCT 25.7* 25.3* 24.2* 23.7*  PLT 120* 120* PLATELET CLUMPS NOTED ON SMEAR, COUNT APPEARS DECREASED 122*    Basename 10/29/11 0530 10/28/11 0633  10/27/11 1532 10/27/11 0630 10/26/11 0620  NA 138 135 135 138 137  K 4.5 4.6 5.0 5.4* 5.4*  CL 105 104 103 104 104  CO2 23 21 23 22 22   BUN 23 26* 31* 32* 28*  CREATININE 1.71* 1.92* 2.33* 2.43* 1.64*  GLUCOSE 167* 183* 159* 181* 203*  CALCIUM 9.6 9.5 9.2 8.9 10.0   Lab Results  Component Value Date   INR 1.04 10/20/2011   INR 1.10 08/10/2011   INR 1.19 06/12/2011    Examination:  General appearance: alert, cooperative and mild distress Resp: clear to auscultation bilaterally Cardio: regular rate and rhythm GI: normal findings: bowel sounds normal  Wound Exam: clean, dry, intact  Drainage:  None: wound tissue dry  Motor Exam: EHL, FHL, Anterior Tibial and Posterior Tibial Intact  Sensory Exam: Superficial Peroneal, Deep Peroneal and Tibial normal  Vascular Exam: Left dorsalis pedis artery has 1+ (weak) pulse  Assessment:    3 Days Post-Op  Procedure(s) (LRB): TOTAL KNEE REVISION (Left)  ADDITIONAL DIAGNOSIS:  Active Problems:  DM (diabetes mellitus), type 2 with complications  HYPERCHOLESTEROLEMIA  IIA  GOUT  ANEMIA, NORMOCYTIC, CHRONIC  DEPRESSION  HYPERTENSION, UNSPECIFIED  CAD, UNSPECIFIED SITE  COLITIS, ULCERATIVE NOS  Infection of prosthetic left knee joint  CKD (chronic kidney disease) stage 3, GFR 30-59 ml/min  Staphylococcus aureus bacteremia  Acute renal failure  Acute Blood Loss Anemia, Diabetes and Renal Insufficiency Chronic   Plan: Physical Therapy as ordered Weight Bearing as Tolerated (WBAT)  DVT Prophylaxis:  Xarelto and TED hose  DISCHARGE PLAN: Skilled Nursing Facility/Rehab [facility] today  DISCHARGE NEEDS: has at home         [facility] 10/29/2011, 7:39 AM

## 2011-10-29 NOTE — Progress Notes (Signed)
PT PROGRESS NOTE  10/28/11 1755  PT Visit Information  Last PT Received On 10/29/11  PT Time Calculation  PT Start Time 1751  PT Stop Time 1803  PT Time Calculation (min) 12 min  Subjective Data  Patient Stated Goal get better  Precautions  Precautions Knee  Restrictions  Weight Bearing Restrictions Yes  LLE Weight Bearing WBAT  Cognition  Overall Cognitive Status Appears within functional limits for tasks assessed/performed  Arousal/Alertness Lethargic  Orientation Level Appears intact for tasks assessed  Behavior During Session [facility] for tasks performed  Bed Mobility  Bed Mobility Not assessed  Transfers  Transfers Not assessed  Ambulation/Gait  Ambulation/Gait Assistance Not tested (comment)  Wheelchair Mobility  Wheelchair Mobility No  Balance  Balance Assessed No  Exercises  Exercises Total Joint  Total Joint Exercises  Ankle Circles/Pumps AROM;Both;20 reps  Quad Sets AROM;Left;10 reps  Heel Slides AROM;Left;5 reps  Short Arc Quad Left;10 reps;Supine  Hip ABduction/ADduction Left;5 reps;Supine  Straight Leg Raises 5 reps;Left;AAROM;Supine  PT - End of Session  Activity Tolerance Patient limited by fatigue  Patient left in bed;with call bell/phone within reach  PT - Assessment/Plan  Comments on Treatment Session Pt instucted in HEP for increase ROM and strength in L LE.   PT Plan Discharge plan remains appropriate;Frequency remains appropriate  PT Frequency 7X/week  Acute Rehab PT Goals  Pt will Perform Home Exercise Program with supervision, verbal cues required/provided  PT Goal: Perform Home Exercise Program - Progress Progressing toward goal  PT General Charges  $$ ACUTE PT VISIT 1 Procedure  PT Treatments  $Therapeutic Exercise 8-22 mins   Moya Duan L. Artice Bergerson DPT [phone]

## 2011-10-29 NOTE — Clinical Social Work Placement (Addendum)
    Clinical Social Work Department CLINICAL SOCIAL WORK PLACEMENT NOTE 10/29/2011  Patient:  [name], [name]  Account Number:  [id number] Admit date:  10/26/2011  Clinical Social Worker:  Lupita Leash Dannah Ryles, BSW  Date/time:  10/27/2011 02:30 AM  Clinical Social Work is seeking post-discharge placement for this patient at the following level of care:   SKILLED NURSING   (*CSW will update this form in Epic as items are completed)   10/27/2011  Patient/family provided with [facility] Department of Clinical Social Work's list of facilities offering this level of care within the geographic area requested by the patient (or if unable, by the patient's family).  10/27/2011  Patient/family informed of their freedom to choose among providers that offer the needed level of care, that participate in Medicare, Medicaid or managed care program needed by the patient, have an available bed and are willing to accept the patient.  10/27/2011  Patient/family informed of [facility]' ownership interest in [facility], as well as of the fact that they are under no obligation to receive care at this facility.  PASARR submitted to EDS on  PASARR number received from EDS on   FL2 transmitted to all facilities in geographic area requested by pt/family on  10/27/2011 FL2 transmitted to all facilities within larger geographic area on   Patient informed that his/her managed care company has contracts with or will negotiate with  certain facilities, including the following:   Old [place] number in place . Patient has [employer]- Medicare.     Patient/family informed of bed offers received:  10/27/2011 Patient chooses bed at [facility] Physician recommends and patient chooses bed at    Patient to be transferred to  [facility] on  10/29/2011 Patient to be transferred to facility by ambulance  [facility])  The following physician request were entered in Epic:   Additional  Comments: DC to SNF today.  Patient and wife are pleased with d/c plan. Notified SNF and pt's nurse of d/c plan.  Lorri Frederick. [place]  [phone]

## 2011-10-31 LAB — ANAEROBIC CULTURE: Gram Stain: NONE SEEN

## 2011-11-08 ENCOUNTER — Ambulatory Visit: Payer: Medicare Other | Admitting: Internal Medicine

## 2011-11-09 ENCOUNTER — Ambulatory Visit: Payer: Medicare Other | Admitting: Internal Medicine

## 2012-11-27 ENCOUNTER — Other Ambulatory Visit: Payer: Self-pay | Admitting: *Deleted

## 2012-11-27 MED ORDER — INSULIN GLARGINE 100 UNIT/ML SOLOSTAR PEN: 48.0000 [IU] | PEN_INJECTOR | Freq: Every day | SUBCUTANEOUS | Status: DC

## 2012-11-29 ENCOUNTER — Other Ambulatory Visit: Payer: Self-pay | Admitting: *Deleted

## 2012-11-29 DIAGNOSIS — E7800 Pure hypercholesterolemia, unspecified: Secondary | ICD-10-CM

## 2012-11-29 DIAGNOSIS — E118 Type 2 diabetes mellitus with unspecified complications: Secondary | ICD-10-CM

## 2012-12-07 ENCOUNTER — Other Ambulatory Visit: Payer: Medicare Other

## 2012-12-08 ENCOUNTER — Ambulatory Visit: Payer: Medicare Other | Admitting: Endocrinology

## 2013-03-20 ENCOUNTER — Other Ambulatory Visit: Payer: Self-pay | Admitting: Nephrology

## 2013-04-19 ENCOUNTER — Telehealth: Payer: Self-pay | Admitting: Neurology

## 2013-04-19 NOTE — Telephone Encounter (Signed)
Called patient and spoke with wife, want to let physician know that patient sometimes say out of the way things, talks to TV, sleeps for 14 hours,does not make good decisions, confused, forgets to take his medicine, not violent.  May be coming from diabetes or dementia, not sure.

## 2013-04-19 NOTE — Telephone Encounter (Signed)
I called the patient talked with the wife. The patient will be seen as a new patient next week. The patient has had some cognitive changes over time. The patient was seen through this office in 2009, at that time, the patient had extensive small vessel disease by MRI. 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   MRI brain 04/22/2007:  IMPRESSION:  1. No acute infarct.  2. No abnormal enhancing lesion.  3. Progressive significant white matter-type changes as discussed above.  4. Pan-paranasal sinus mucosal thickening/opacification (most notable right maxillary sinus) and bilateral mastoid air cell/middle ear opacification.  5. No intracranial hemorrhage.

## 2013-04-19 NOTE — Telephone Encounter (Signed)
Patient's wife Inez Catalina wanted to leave a message making the nurses aware of her husband's personality changes before he comes for his appointment. He gets very agitated with questions.

## 2013-04-20 ENCOUNTER — Encounter: Payer: Self-pay | Admitting: Neurology

## 2013-04-20 ENCOUNTER — Ambulatory Visit
Admission: RE | Admit: 2013-04-20 | Discharge: 2013-04-20 | Disposition: A | Discharge status: Home / Self Care | Payer: Medicare Other | Source: Ambulatory Visit | Attending: Nephrology | Admitting: Nephrology

## 2013-04-20 DIAGNOSIS — N183 Chronic kidney disease, stage 3 unspecified: Secondary | ICD-10-CM

## 2013-04-23 ENCOUNTER — Encounter (INDEPENDENT_AMBULATORY_CARE_PROVIDER_SITE_OTHER): Payer: Self-pay

## 2013-04-23 ENCOUNTER — Ambulatory Visit (INDEPENDENT_AMBULATORY_CARE_PROVIDER_SITE_OTHER): Payer: Medicare Other | Admitting: Neurology

## 2013-04-23 ENCOUNTER — Encounter: Payer: Self-pay | Admitting: Neurology

## 2013-04-23 VITALS — BP 115/73 | HR 83 | Ht 71.0 in | Wt 263.0 lb

## 2013-04-23 DIAGNOSIS — R51 Headache: Secondary | ICD-10-CM

## 2013-04-23 DIAGNOSIS — R413 Other amnesia: Secondary | ICD-10-CM | POA: Insufficient documentation

## 2013-04-23 NOTE — Patient Instructions (Signed)
Fall Prevention and Home Safety °Falls cause injuries and can affect all age groups. It is possible to prevent falls.  °HOW TO PREVENT FALLS °· Wear shoes with rubber soles that do not have an opening for your toes. °· Keep the inside and outside of your house well lit. °· Use night lights throughout your home. °· Remove clutter from floors. °· Clean up floor spills. °· Remove throw rugs or fasten them to the floor with carpet tape. °· Do not place electrical cords across pathways. °· Put grab bars by your tub, shower, and toilet. Do not use towel bars as grab bars. °· Put handrails on both sides of the stairway. Fix loose handrails. °· Do not climb on stools or stepladders, if possible. °· Do not wax your floors. °· Repair uneven or unsafe sidewalks, walkways, or stairs. °· Keep items you use a lot within reach. °· Be aware of pets. °· Keep emergency numbers next to the telephone. °· Put smoke detectors in your home and near bedrooms. °Ask your doctor what other things you can do to prevent falls. °Document Released: 01/16/2009 Document Revised: 09/21/2011 Document Reviewed: 06/22/2011 °Exit[employer]® Patient Information ©2014 [employer]. ° °

## 2013-04-23 NOTE — Progress Notes (Signed)
Reason for visit: Memory disorder  [name] is a 73 y.o. male  History of present illness:  [name] is a 73 year old right-handed white male with a history of a memory disorder dating back 6-12 months. The patient has been noted to have onset of some change in behavior. The patient is having some problems with short-term memory, repeating himself more frequently. The patient has been more mellow according to the wife, as he used to be a hyper person. The patient will say inappropriate things at times, sleeping more, and appears to have poor judgment, not making good decisions. The patient has difficulty with word finding problems, and is no longer concerned about paying the bills, forgets to take his medications, and oftentimes gives away his money, which is something he never used to do. The patient at times may have inappropriate laughter, and some problems with directions while driving. The patient this misspells words more frequently with handwriting. The patient has had some problems with balance, and uses a cane for ambulation. The patient has had degenerative arthritis of the knees, with knee replacements. The patient has not had any falls. The patient denies any numbness or weakness of the extremities. The patient does report some headaches that have come on over the last 8 weeks or so that are virtually daily in nature. The patient denies problems controlling the bowels or the bladder. The patient is sent to this office for an evaluation. The patient last had a CT scan of the brain on 06/18/2011 showing some small vessel disease. The patient is sent to this office for further evaluation.   Past Medical History  Diagnosis Date  . Gout   . Depression   . Diabetes mellitus   . Unspecified essential hypertension   . Hypercholesteremia     IIA  . Colitis, ulcerative     NOS  . Personal hx-rectal/anal malignancy   . Urinary incontinence   . Benign prostatic hypertrophy    S/p TURP   . H/O ETOH abuse 06/06/2011  . 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 Pleurisy   . AAA (abdominal aortic aneurysm) 1992    S/p repair and grafting  . History of alcohol abuse   . CAD (coronary artery disease)   . Stroke     73 yrs old  . Stones in the urinary tract   . GERD (gastroesophageal reflux disease)   . Normocytic anemia     Chronic  . PAF (paroxysmal atrial fibrillation)     during hospitalization 06/2011 for sepsis  . Cardiomyopathy, ischemic     EF 40-45% by echo 06/2011  . Bladder cancer 2006  . Bladder cancer     Transitional Cell  . Colon cancer   . Chronic bronchitis     "used to get it quite often; found out medicine was causing it"  . Arthritis     "left knee"  . Memory disorder 04/23/2013  . Obesity     Past Surgical History  Procedure Laterality Date  . Transurethral resection of prostate    . Abdominal aortic aneurysm repair  1992    "had 3 blockages; 3 aneurysms repaired at the same time"  . I&d knee with poly exchange  06/12/2011    Procedure: IRRIGATION AND DEBRIDEMENT KNEE WITH POLY EXCHANGE;  Surgeon: Yvette Rack., MD;  Location: [facility];  Service: Orthopedics;  Laterality: Left;  . Appendectomy    . Eye surgery    .  Coronary angioplasty      1992  . Colonoscopy w/ polypectomy    . Cataract extraction w/ intraocular lens  implant, bilateral  ~ 2010  . Total knee arthroplasty  1990's    left  . Joint replacement      left knee  . Total knee revision  10/26/11    left  . Total knee revision  10/26/2011    Procedure: TOTAL KNEE REVISION;  Surgeon: Garald Balding, MD;  Location: [place];  Service: Orthopedics;  Laterality: Left;  left total knee revision  . Angioplasty    . Bladder tumor excision  2002  . Compound fracture ankle  08/17/2003    Family History  Problem Relation Age of Onset  . Dementia Mother   . COPD Father   . Anesthesia problems Neg Hx   . Dementia Sister   . Cancer  Brother   . Cancer Brother   . Dementia Brother     Social history:  reports that he quit smoking about 42 years ago. His smoking use included Cigarettes. He has a 24 pack-year smoking history. His smokeless tobacco use includes Chew. He reports that he drinks alcohol. He reports that he does not use illicit drugs.  Medications:  Current Outpatient Prescriptions on File Prior to Visit  Medication Sig Dispense Refill  . Insulin Glargine (LANTUS SOLOSTAR) 100 UNIT/ML SOPN Inject 48 Units into the skin at bedtime.  5 pen  5  . Multiple Vitamin (MULTIVITAMIN) tablet Take 1 tablet by mouth daily.        Marland Kitchen omega-3 acid ethyl esters (LOVAZA) 1 G capsule Take 4 g by mouth daily.      Marland Kitchen PARoxetine (PAXIL) 20 MG tablet Take 10-20 mg by mouth 2 (two) times daily. 1 tab in am, 0.5 tab in pm      . rosuvastatin (CRESTOR) 20 MG tablet Take 20 mg by mouth at bedtime.      . metoprolol tartrate (LOPRESSOR) 25 MG tablet Take 25 mg by mouth 2 (two) times daily.      . pantoprazole (PROTONIX) 40 MG tablet Take 40 mg by mouth daily at 12 noon.       No current facility-administered medications on file prior to visit.      Allergies  Allergen Reactions  . Fentanyl Other (See Comments)    Delirium  . Hydromorphone Hcl Other (See Comments)    Delirium  . Lorazepam Other (See Comments)    Delirium  . Oxycodone-Acetaminophen     Delirium    ROS:  Out of a complete 14 system review of symptoms, the patient complains only of the following symptoms, and all other reviewed systems are negative.  Fatigue Chest pain Hearing loss Skin rash Incontinence of bladder, impotence Easy bruising, feeling cold Joint pain, achy muscles Memory loss, confusion, headache, weakness, dizziness, tremor  Blood pressure 115/73, pulse 83, height 5\' 11"  (1.803 m), weight 263 lb (119.296 kg).  Physical Exam  General: The patient is alert and cooperative at the time of the examination. the patient is moderately  obese.  Eyes: Pupils are equal, round, and reactive to light. Discs are flat bilaterally.  Neck: The neck is supple, no carotid bruits are noted.  Respiratory: The respiratory examination is clear.  Cardiovascular: The cardiovascular examination reveals a regular rate and rhythm, no obvious murmurs or rubs are noted.  Skin: Extremities are with 1+ edema of ankles bilaterally.  Neurologic Exam  Mental status: The Mini-Mental status examination done  today shows a total score 27/30.  Cranial nerves: Facial symmetry is present. There is good sensation of the face to pinprick and soft touch bilaterally. The strength of the facial muscles and the muscles to head turning and shoulder shrug are normal bilaterally. Speech is well enunciated, no aphasia or dysarthria is noted. Extraocular movements are full. Visual fields are full. The tongue is midline, and the patient has symmetric elevation of the soft palate. No obvious hearing deficits are noted.  Motor: The motor testing reveals 5 over 5 strength of all 4 extremities. Good symmetric motor tone is noted throughout.  Sensory: Sensory testing is intact to pinprick, soft touch, vibration sensation, and position sense on all 4 extremities. No evidence of extinction is noted.  Coordination: Cerebellar testing reveals good finger-nose-finger and heel-to-shin bilaterally.  Gait and station: Gait is wide-based, the patient uses a cane for ambulation. Tandem gait is unsteady. Romberg is negative. No drift is seen.  Reflexes: Deep tendon reflexes are symmetric, but are depressed bilaterally. Toes are downgoing bilaterally.   CT head 06/18/2011:  IMPRESSION:  1. No definite acute intracranial abnormalities (please note that  accurate assessment for subtle regions acute/subacute ischemia upon  this background of chronic microvascular ischemic changes in the  brain is exceedingly difficult).  2. 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  3. Mild mucosal thickening in the ethmoid sinuses and small air  fluid level in the right maxillary sinus. Clinical correlation for  signs of symptoms of acute sinusitis is recommended.    Assessment/Plan:  1. Progressive memory disturbance  2. Gait disturbance  3. Diabetes  4. Daily headache  The patient does have risk factors for stroke events. The patient will be sent for MRI evaluation of the brain. The patient will have blood work done today, and this will include a sedimentation rate. The patient will be considered for medication for memory. The patient himself tends to deny that he has any memory or cognitive problems. Some of the features of his cognitive changes made potentially be related to executive functions disorders, and the possibility of a frontotemporal dementia and needs to be considered. The patient followup in 6 months.  Jill Alexanders MD 04/23/2013 7:41 PM  [facility] [address] [place], [place] [address]  Phone [phone] Fax [phone]

## 2013-04-24 LAB — SEDIMENTATION RATE: Sed Rate: 12 mm/hr (ref 0–30)

## 2013-04-24 LAB — RPR: SYPHILIS RPR SCR: NONREACTIVE

## 2013-04-24 LAB — VITAMIN B12: Vitamin B-12: 388 pg/mL (ref 211–946)

## 2013-04-24 LAB — TSH: TSH: 1.88 u[IU]/mL (ref 0.450–4.500)

## 2013-05-03 ENCOUNTER — Ambulatory Visit
Admission: RE | Admit: 2013-05-03 | Discharge: 2013-05-03 | Disposition: A | Discharge status: Home / Self Care | Payer: Medicare Other | Source: Ambulatory Visit | Attending: Neurology | Admitting: Neurology

## 2013-05-03 DIAGNOSIS — R51 Headache: Secondary | ICD-10-CM

## 2013-05-03 DIAGNOSIS — R413 Other amnesia: Secondary | ICD-10-CM

## 2013-05-04 ENCOUNTER — Telehealth: Payer: Self-pay | Admitting: Neurology

## 2013-05-04 NOTE — Telephone Encounter (Signed)
Called patient's wife [name] and explained to her that the patient just had the MRI yesterday and that we have not received the results yet. If she would give [place] until at least the middle of next week, then we should here something and someone would give them a call. I advised the patient's wife that if the patient has any other problems, questions or concerns to call the office. Patient's wife verbalized understanding.

## 2013-05-04 NOTE — Telephone Encounter (Signed)
Patient's wife calling regarding MRI results. Please call to advise.

## 2013-05-05 ENCOUNTER — Telehealth: Payer: Self-pay | Admitting: Neurology

## 2013-05-05 NOTE — Telephone Encounter (Signed)
I called the patient and I talked with the wife. The MRI of the brain shows a moderate level of SVD, progressed from 2007. The patient went off aspirin recently due to CRI, ? If he could go on plavix. We will follow the memory problem over time. The SVD may be adding to the memory problems.

## 2013-05-24 ENCOUNTER — Telehealth: Payer: Self-pay | Admitting: Nurse Practitioner

## 2013-05-24 MED ORDER — DONEPEZIL HCL 5 MG PO TABS: 5.0000 mg | ORAL_TABLET | Freq: Every day | ORAL | Status: DC
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
## 2013-06-06 ENCOUNTER — Telehealth: Payer: Self-pay | Admitting: Neurology

## 2013-06-06 NOTE — Telephone Encounter (Signed)
Pt's wife Inez Catalina called states Dr. Jannifer Franklin wanted pt to come in after pt has been taking the donepezil (ARICEPT) 5 MG tablet for a couple of weeks. Please call Inez Catalina to set up this apt per Dr. Jannifer Franklin.

## 2013-06-06 NOTE — Telephone Encounter (Signed)
Called and spoke with wife and scheduled /confirmed sooner f/u appt

## 2013-06-22 ENCOUNTER — Ambulatory Visit (INDEPENDENT_AMBULATORY_CARE_PROVIDER_SITE_OTHER): Payer: Medicare Other | Admitting: Nurse Practitioner

## 2013-06-22 ENCOUNTER — Encounter: Payer: Self-pay | Admitting: Nurse Practitioner

## 2013-06-22 VITALS — BP 141/74 | HR 72 | Ht 70.0 in | Wt 262.0 lb

## 2013-06-22 DIAGNOSIS — R413 Other amnesia: Secondary | ICD-10-CM

## 2013-06-22 MED ORDER — DONEPEZIL HCL 10 MG PO TABS: 10.0000 mg | ORAL_TABLET | Freq: Every day | ORAL | Status: DC

## 2013-06-22 MED ORDER — DONEPEZIL HCL 10 MG PO TABS: 5.0000 mg | ORAL_TABLET | Freq: Every day | ORAL | Status: DC
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
## 2013-06-22 NOTE — Patient Instructions (Addendum)
Continue Aricept at current dose until the pills are gone.  Next time you pick up the prescription it will be at the 10 mg dose.  No driving.  Follow up in 6 months, sooner as needed.

## 2013-06-23 NOTE — Progress Notes (Signed)
I have read the note, and I agree with the clinical assessment and plan.  Oaklan Persons,Alvan KEITH   

## 2013-06-28 ENCOUNTER — Telehealth: Payer: Self-pay | Admitting: Oncology

## 2013-06-28 NOTE — Telephone Encounter (Signed)
C/D 06/28/13 for appt.07/03/13

## 2013-06-28 NOTE — Telephone Encounter (Signed)
S/W PATIENT AND GAVE NEW PATIENT APPT FOR 03/31 @ 10:30 W/DR. SHADAD.  REFERRING- [facility] DX- ABN FREE LIGHT CHAIN WELCOME PACKET MAILED.

## 2013-06-29 ENCOUNTER — Other Ambulatory Visit: Payer: Self-pay | Admitting: Oncology

## 2013-06-29 DIAGNOSIS — D729 Disorder of white blood cells, unspecified: Secondary | ICD-10-CM

## 2013-07-02 ENCOUNTER — Telehealth: Payer: Self-pay | Admitting: Medical Oncology

## 2013-07-02 NOTE — Telephone Encounter (Signed)
Confirmed appt with patient's spouse for tomorrow @ 10:30, requested that pt bring a list of his current medications as well as informed them of our free valet parking. Spouse requesting to know what the visit was for, states that patient's doctor did not explain to them. Informed spouse, I did not have that information and that Dr Alen Blew will discuss with them at appt. Spouse expressed thanks, denies further questions at this time.

## 2013-07-03 ENCOUNTER — Encounter: Payer: Self-pay | Admitting: Oncology

## 2013-07-03 ENCOUNTER — Other Ambulatory Visit (HOSPITAL_BASED_OUTPATIENT_CLINIC_OR_DEPARTMENT_OTHER): Payer: Medicare Other

## 2013-07-03 ENCOUNTER — Ambulatory Visit: Payer: Medicare Other

## 2013-07-03 ENCOUNTER — Ambulatory Visit (HOSPITAL_COMMUNITY)
Admission: RE | Admit: 2013-07-03 | Discharge: 2013-07-03 | Disposition: A | Discharge status: Home / Self Care | Payer: Medicare Other | Source: Ambulatory Visit | Attending: Oncology | Admitting: Oncology

## 2013-07-03 ENCOUNTER — Ambulatory Visit (HOSPITAL_BASED_OUTPATIENT_CLINIC_OR_DEPARTMENT_OTHER): Payer: Medicare Other | Admitting: Oncology

## 2013-07-03 ENCOUNTER — Telehealth: Payer: Self-pay | Admitting: Oncology

## 2013-07-03 VITALS — BP 123/62 | HR 73 | Temp 98.4°F | Resp 20 | Ht 70.0 in | Wt 258.1 lb

## 2013-07-03 DIAGNOSIS — C9000 Multiple myeloma not having achieved remission: Secondary | ICD-10-CM | POA: Insufficient documentation

## 2013-07-03 DIAGNOSIS — D729 Disorder of white blood cells, unspecified: Secondary | ICD-10-CM

## 2013-07-03 DIAGNOSIS — E119 Type 2 diabetes mellitus without complications: Secondary | ICD-10-CM

## 2013-07-03 DIAGNOSIS — I6529 Occlusion and stenosis of unspecified carotid artery: Secondary | ICD-10-CM | POA: Insufficient documentation

## 2013-07-03 DIAGNOSIS — I10 Essential (primary) hypertension: Secondary | ICD-10-CM

## 2013-07-03 DIAGNOSIS — N189 Chronic kidney disease, unspecified: Secondary | ICD-10-CM

## 2013-07-03 DIAGNOSIS — D7289 Other specified disorders of white blood cells: Secondary | ICD-10-CM

## 2013-07-03 DIAGNOSIS — Z96659 Presence of unspecified artificial knee joint: Secondary | ICD-10-CM | POA: Insufficient documentation

## 2013-07-03 DIAGNOSIS — I70209 Unspecified atherosclerosis of native arteries of extremities, unspecified extremity: Secondary | ICD-10-CM | POA: Insufficient documentation

## 2013-07-03 DIAGNOSIS — I658 Occlusion and stenosis of other precerebral arteries: Secondary | ICD-10-CM | POA: Insufficient documentation

## 2013-07-03 DIAGNOSIS — Z85048 Personal history of other malignant neoplasm of rectum, rectosigmoid junction, and anus: Secondary | ICD-10-CM

## 2013-07-03 DIAGNOSIS — M5030 Other cervical disc degeneration, unspecified cervical region: Secondary | ICD-10-CM | POA: Insufficient documentation

## 2013-07-03 DIAGNOSIS — M542 Cervicalgia: Secondary | ICD-10-CM | POA: Insufficient documentation

## 2013-07-03 LAB — COMPREHENSIVE METABOLIC PANEL (CC13)
ALBUMIN: 4.1 g/dL (ref 3.5–5.0)
ALK PHOS: 43 U/L (ref 40–150)
ALT: 18 U/L (ref 0–55)
AST: 23 U/L (ref 5–34)
Anion Gap: 10 mEq/L (ref 3–11)
BILIRUBIN TOTAL: 0.5 mg/dL (ref 0.20–1.20)
BUN: 21.7 mg/dL (ref 7.0–26.0)
CO2: 22 mEq/L (ref 22–29)
CREATININE: 2.4 mg/dL — AB (ref 0.7–1.3)
Calcium: 9.8 mg/dL (ref 8.4–10.4)
Chloride: 104 mEq/L (ref 98–109)
GLUCOSE: 310 mg/dL — AB (ref 70–140)
POTASSIUM: 4.8 meq/L (ref 3.5–5.1)
Sodium: 137 mEq/L (ref 136–145)
Total Protein: 6.8 g/dL (ref 6.4–8.3)

## 2013-07-03 LAB — CBC WITH DIFFERENTIAL/PLATELET
BASO%: 1 % (ref 0.0–2.0)
BASOS ABS: 0.1 10*3/uL (ref 0.0–0.1)
EOS%: 9.4 % — AB (ref 0.0–7.0)
Eosinophils Absolute: 0.6 10*3/uL — ABNORMAL HIGH (ref 0.0–0.5)
HCT: 37.2 % — ABNORMAL LOW (ref 38.4–49.9)
HEMOGLOBIN: 12.8 g/dL — AB (ref 13.0–17.1)
LYMPH%: 25.8 % (ref 14.0–49.0)
MCH: 32 pg (ref 27.2–33.4)
MCHC: 34.5 g/dL (ref 32.0–36.0)
MCV: 92.7 fL (ref 79.3–98.0)
MONO#: 0.4 10*3/uL (ref 0.1–0.9)
MONO%: 6.1 % (ref 0.0–14.0)
NEUT#: 3.8 10*3/uL (ref 1.5–6.5)
NEUT%: 57.7 % (ref 39.0–75.0)
PLATELETS: 205 10*3/uL (ref 140–400)
RBC: 4.02 10*6/uL — ABNORMAL LOW (ref 4.20–5.82)
RDW: 13 % (ref 11.0–14.6)
WBC: 6.6 10*3/uL (ref 4.0–10.3)
lymph#: 1.7 10*3/uL (ref 0.9–3.3)

## 2013-07-03 NOTE — Progress Notes (Signed)
Checked in new pt with no financial concerns. °

## 2013-07-03 NOTE — Progress Notes (Signed)
Please see consult note.  

## 2013-07-03 NOTE — Telephone Encounter (Signed)
gv adn printed aptp sched and avs for pt for May....sent pt to radiology

## 2013-07-03 NOTE — Consult Note (Signed)
Reason for Referral: Evaluation for a plasma cell disorder.   HPI: 73 year old gentleman native of [place] where he lived the majority of his life. He is a gentleman with multiple comorbid conditions including remote history of bladder tumors as well as colon cancer. He received chemotherapy with radiation dating back to 2007 and subsequently a total colectomy that was done in October of 2007. He received surveillance CT scans until 2009 without any evidence of recurrent disease. He was followed by his primary care physician and found to have worsening renal insufficiency with creatinines have ranged between 1.3-2.3. He was referred to Dr. Florene Glen at [facility] and his workup revealed increased in free kappa light chain up to 30.2 with the upper limit of normal was 19. The kappa to lambda ratio was 2.46 and the upper limit of normal is 1.65. Patient was referred to me for the evaluation for possible plasma cell disorder. Clinically, he has few complaints that have been relatively chronic. He does report some pain in his knees and weakness overall. He does report some occasional unsteadiness and no falls. He does use a cane for mobility. He does report diffuse bone pain but no pathological fractures. Has not reported any constitutional symptoms of fevers or chills or sweats or weight loss. Has not reported any recent illnesses or hospitalizations. Has not reported any worsening neuropathy or recurrent infections.   Past Medical History  Diagnosis Date  . Gout   . Depression   . Diabetes mellitus   . Unspecified essential hypertension   . Hypercholesteremia     IIA  . Colitis, ulcerative     NOS  . Personal hx-rectal/anal malignancy   . Urinary incontinence   . Benign prostatic hypertrophy     S/p TURP   . H/O ETOH abuse 06/06/2011  . 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 Pleurisy   . AAA (abdominal aortic aneurysm) 1992    S/p  repair and grafting  . History of alcohol abuse   . CAD (coronary artery disease)   . Stroke     73 yrs old  . Stones in the urinary tract   . GERD (gastroesophageal reflux disease)   . Normocytic anemia     Chronic  . PAF (paroxysmal atrial fibrillation)     during hospitalization 06/2011 for sepsis  . Cardiomyopathy, ischemic     EF 40-45% by echo 06/2011  . Bladder cancer 2006  . Bladder cancer     Transitional Cell  . Colon cancer   . Chronic bronchitis     "used to get it quite often; found out medicine was causing it"  . Arthritis     "left knee"  . Memory disorder 04/23/2013  . Obesity   :  Past Surgical History  Procedure Laterality Date  . Transurethral resection of prostate    . Abdominal aortic aneurysm repair  1992    "had 3 blockages; 3 aneurysms repaired at the same time"  . I&d knee with poly exchange  06/12/2011    Procedure: IRRIGATION AND DEBRIDEMENT KNEE WITH POLY EXCHANGE;  Surgeon: Yvette Rack., MD;  Location: [facility];  Service: Orthopedics;  Laterality: Left;  . Appendectomy    . Eye surgery    . Coronary angioplasty      1992  . Colonoscopy w/ polypectomy    . Cataract extraction w/ intraocular lens  implant, bilateral  ~ 2010  . Total knee arthroplasty  1990's    left  . Joint replacement      left knee  . Total knee revision  10/26/11    left  . Total knee revision  10/26/2011    Procedure: TOTAL KNEE REVISION;  Surgeon: Garald Balding, MD;  Location: [facility];  Service: Orthopedics;  Laterality: Left;  left total knee revision  . Angioplasty    . Bladder tumor excision  2002  . Compound fracture ankle  08/17/2003  :  Current Outpatient Prescriptions  Medication Sig Dispense Refill  . diltiazem (CARDIZEM CD) 240 MG 24 hr capsule Take 240 mg by mouth daily.      Marland Kitchen donepezil (ARICEPT) 10 MG tablet Take 1 tablet (10 mg total) by mouth at bedtime.  30 tablet  5  . fenofibrate micronized (LOFIBRA) 134 MG capsule Take 145 mg by mouth daily.        . Ferrous Fumarate (HIGH POTENCY IRON) 86 (27 FE) MG CAPS Take 27 mg by mouth daily.      . Insulin Glargine (LANTUS SOLOSTAR) 100 UNIT/ML SOPN Inject 48 Units into the skin at bedtime.  5 pen  5  . metoprolol tartrate (LOPRESSOR) 25 MG tablet Take 25 mg by mouth 2 (two) times daily.      . Multiple Vitamin (MULTIVITAMIN) tablet Take 1 tablet by mouth daily.        Marland Kitchen NOVOLOG FLEXPEN 100 UNIT/ML FlexPen Inject 100 Units into the skin 3 (three) times daily - between meals and at bedtime. 16 UNITS AM, 20 UNITS at LUNCH, 14 UNITS PM      . pantoprazole (PROTONIX) 40 MG tablet Take 40 mg by mouth daily at 12 noon.      Marland Kitchen PARoxetine (PAXIL) 20 MG tablet Take 10-20 mg by mouth 2 (two) times daily. 1 tab in am, 0.5 tab in pm      . rosuvastatin (CRESTOR) 20 MG tablet Take 20 mg by mouth at bedtime.      Marland Kitchen omega-3 acid ethyl esters (LOVAZA) 1 G capsule Take 4 g by mouth daily.      Marland Kitchen PROAIR HFA 108 (90 BASE) MCG/ACT inhaler        No current facility-administered medications for this visit.      Allergies  Allergen Reactions  . Fentanyl Other (See Comments)    Delirium  . Hydromorphone Hcl Other (See Comments)    Delirium  . Lorazepam Other (See Comments)    Delirium  . Oxycodone-Acetaminophen     Delirium  :  Family History  Problem Relation Age of Onset  . Dementia Mother   . COPD Father   . Anesthesia problems Neg Hx   . Dementia Sister   . Cancer Brother   . Cancer Brother   . Dementia Brother   :  History   Social History  . Marital Status: Married    Spouse Name: N/A    Number of Children: 3  . Years of Education: hs   Occupational History  . Full time    Social History Main Topics  . Smoking status: Former Smoker -- 2.00 packs/day for 12 years    Types: Cigarettes    Quit date: 06/04/1970  . Smokeless tobacco: Current User    Types: Chew  . Alcohol Use: Yes     Comment: Occasional  . Drug Use: No  . Sexual Activity: Not Currently    Birth Control/  Protection: None   Other Topics Concern  . Not on file  Social History Narrative   Married. Patient lives at home with his wife.  :  Constitutional: positive for fatigue, negative for anorexia, night sweats and weight loss Eyes: negative for icterus, irritation and redness Ears, nose, mouth, throat, and face: negative for epistaxis, tinnitus and voice change Respiratory: positive for dyspnea on exertion, negative for hemoptysis, sputum and wheezing Cardiovascular: negative for chest pain, orthopnea and palpitations Gastrointestinal: negative for abdominal pain, constipation, diarrhea, melena and vomiting Genitourinary:negative for dysuria, frequency and hematuria Integument/breast: negative for pruritus and rash Hematologic/lymphatic: negative for bleeding, easy bruising and lymphadenopathy Musculoskeletal:negative for myalgias and stiff joints Neurological: negative for tremors and vertigo Behavioral/Psych: negative for anxiety and depression Endocrine: negative for temperature intolerance Allergic/Immunologic: negative for anaphylaxis and urticaria  Exam: ECOG 1 Blood pressure 123/62, pulse 73, temperature 98.4 F (36.9 C), temperature source Oral, resp. rate 20, height 5' 10"  (1.778 m), weight 258 lb 1.6 oz (117.073 kg). General appearance: alert, cooperative and appears stated age Head: Normocephalic, without obvious abnormality, atraumatic Throat: lips, mucosa, and tongue normal; teeth and gums normal Neck: no adenopathy, no carotid bruit, no JVD, supple, symmetrical, trachea midline and thyroid not enlarged, symmetric, no tenderness/mass/nodules Back: symmetric, no curvature. ROM normal. No CVA tenderness. Resp: clear to auscultation bilaterally Chest wall: no tenderness Cardio: regular rate and rhythm, S1, S2 normal, no murmur, click, rub or gallop GI: soft, non-tender; bowel sounds normal; no masses,  no organomegaly Extremities: extremities normal, atraumatic, no  cyanosis or edema Pulses: 2+ and symmetric Skin: Skin color, texture, turgor normal. No rashes or lesions Lymph nodes: Cervical, supraclavicular, and axillary nodes normal. Neurologic: Grossly normal   Recent Labs  07/03/13 1029  WBC 6.6  HGB 12.8*  HCT 37.2*  PLT 205    Recent Labs  07/03/13 1030  NA 137  K 4.8  CO2 22  GLUCOSE 310*  BUN 21.7  CREATININE 2.4*  CALCIUM 9.8     Assessment and Plan:   73 year old gentleman with the following issues:   1. Evaluation for a plasma cell disorder: He has a an elevated free kappa light chain that is mild with a kappa to lambda ratio of 2.46 with the upper limit of normal of 1.65. The differential diagnosis was discussed today which includes a plasma cell disorder such as multiple myeloma, amyloidosis, and a monoclonal gammopathy of undetermined significance. This could also be reactive findings due to his chronic renal insufficiency. At this point, I will obtain a serum protein electrophoresis, quantitative immunoglobulins as well as a skeletal survey. I've also discuss with her the possibility of a bone marrow biopsy if I see findings to suggest a plasma cell disorder. He'll return to discuss these results in the near future. His hemoglobin is close to normal at this time and I see no clear-cut end organ damage.  2. Chronically or insufficiency: This could be related to long-standing hypertension and diabetes. I think this is most likely related to his possible plasma cell disorder. His creatinine today is around 2.4.  3. History of rectal cancer: No evidence of recurrence at this time he had surveillance CT scans until 2009. See no indications for it at this time.

## 2013-07-05 LAB — SPEP & IFE WITH QIG
ALBUMIN ELP: 63.6 % (ref 55.8–66.1)
Alpha-1-Globulin: 4.5 % (ref 2.9–4.9)
Alpha-2-Globulin: 10.9 % (ref 7.1–11.8)
Beta 2: 4.9 % (ref 3.2–6.5)
Beta Globulin: 6.8 % (ref 4.7–7.2)
Gamma Globulin: 9.3 % — ABNORMAL LOW (ref 11.1–18.8)
IGA: 155 mg/dL (ref 68–379)
IGG (IMMUNOGLOBIN G), SERUM: 625 mg/dL — AB (ref 650–1600)
IGM, SERUM: 62 mg/dL (ref 41–251)
TOTAL PROTEIN, SERUM ELECTROPHOR: 6.6 g/dL (ref 6.0–8.3)

## 2013-07-05 LAB — KAPPA/LAMBDA LIGHT CHAINS
KAPPA FREE LGHT CHN: 2.5 mg/dL — AB (ref 0.33–1.94)
KAPPA LAMBDA RATIO: 2.07 — AB (ref 0.26–1.65)
LAMBDA FREE LGHT CHN: 1.21 mg/dL (ref 0.57–2.63)

## 2013-08-03 ENCOUNTER — Ambulatory Visit (HOSPITAL_BASED_OUTPATIENT_CLINIC_OR_DEPARTMENT_OTHER): Payer: Medicare Other | Admitting: Oncology

## 2013-08-03 ENCOUNTER — Encounter: Payer: Self-pay | Admitting: Oncology

## 2013-08-03 VITALS — BP 145/86 | HR 66 | Temp 97.6°F | Resp 20 | Ht 70.0 in | Wt 255.8 lb

## 2013-08-03 DIAGNOSIS — N189 Chronic kidney disease, unspecified: Secondary | ICD-10-CM

## 2013-08-03 DIAGNOSIS — D472 Monoclonal gammopathy: Secondary | ICD-10-CM

## 2013-08-03 DIAGNOSIS — Z85048 Personal history of other malignant neoplasm of rectum, rectosigmoid junction, and anus: Secondary | ICD-10-CM

## 2013-08-03 DIAGNOSIS — D7289 Other specified disorders of white blood cells: Secondary | ICD-10-CM

## 2013-08-03 NOTE — Progress Notes (Signed)
Hematology and Oncology Follow Up Visit  [name] [id number] [birth year] 73 y.o. 08/03/2013 9:50 AM   Principle Diagnosis: 73 year old with Elevated serum light chain, kappa subtype with a questionable plasma cell disorder. Diagnosed in March of 2015. He also has a history of remote rectal cancer that is not active.   Current therapy: Observation and surveillance.  Interim History:  [name] presents today for a followup visit. I saw him in consultation on 07/03/2013 for an elevated free kappa light chain. Since his last visit, he continues to be asymptomatic. He has not reported any shoulder pain back pain or any bone pain. He is not reporting any constitutional symptoms of weight loss or appetite changes. He does have chronic renal insufficiency which remained relatively stable. He does report obstructive urinary symptoms which has been chronic in nature and have had a TURP in the past. Overall performance status and activity level remain about the same.  Medications: I have reviewed the patient's current medications.  Current Outpatient Prescriptions  Medication Sig Dispense Refill  . diltiazem (CARDIZEM CD) 240 MG 24 hr capsule Take 240 mg by mouth daily.      Marland Kitchen donepezil (ARICEPT) 10 MG tablet Take 1 tablet (10 mg total) by mouth at bedtime.  30 tablet  5  . fenofibrate micronized (LOFIBRA) 134 MG capsule Take 145 mg by mouth daily.       . Ferrous Fumarate (HIGH POTENCY IRON) 86 (27 FE) MG CAPS Take 27 mg by mouth daily.      . Insulin Glargine (LANTUS SOLOSTAR) 100 UNIT/ML SOPN Inject 48 Units into the skin at bedtime.  5 pen  5  . metoprolol tartrate (LOPRESSOR) 25 MG tablet Take 25 mg by mouth 2 (two) times daily.      . Multiple Vitamin (MULTIVITAMIN) tablet Take 1 tablet by mouth daily.        Marland Kitchen NOVOLOG FLEXPEN 100 UNIT/ML FlexPen Inject 100 Units into the skin 3 (three) times daily - between meals and at bedtime. 16 UNITS AM, 20 UNITS at LUNCH, 14 UNITS PM      . pantoprazole  (PROTONIX) 40 MG tablet Take 40 mg by mouth daily at 12 noon.      Marland Kitchen PARoxetine (PAXIL) 20 MG tablet Take 10-20 mg by mouth 2 (two) times daily. 1 tab in am, 0.5 tab in pm      . PROAIR HFA 108 (90 BASE) MCG/ACT inhaler       . rosuvastatin (CRESTOR) 20 MG tablet Take 20 mg by mouth at bedtime.       No current facility-administered medications for this visit.     Allergies:  Allergies  Allergen Reactions  . Fentanyl Other (See Comments)    Delirium  . Hydromorphone Hcl Other (See Comments)    Delirium  . Lorazepam Other (See Comments)    Delirium  . Oxycodone-Acetaminophen     Delirium    Past Medical History, Surgical history, Social history, and Family History were reviewed and updated.  Review of Systems: Constitutional:  Negative for fever, chills, night sweats, anorexia, weight loss, pain. Cardiovascular: no chest pain or dyspnea on exertion Respiratory: no cough, shortness of breath, or wheezing Neurological: no TIA or stroke symptoms Dermatological: negative ENT: negative Skin: Negative. Gastrointestinal: no abdominal pain, change in bowel habits, or black or bloody stools Genito-Urinary: positive for - nocturia negative for - hematuria Hematological and Lymphatic: negative negative for - bleeding problems, bruising or weight loss Breast: negative Musculoskeletal: negative Remaining  ROS negative. Physical Exam: Blood pressure 145/86, pulse 66, temperature 97.6 F (36.4 C), temperature source Oral, resp. rate 20, height 5' 10"  (1.778 m), weight 255 lb 12.8 oz (116.03 kg), SpO2 99.00%. ECOG:  1 General appearance: alert and cooperative Head: Normocephalic, without obvious abnormality, atraumatic Neck: no adenopathy, no carotid bruit, no JVD, supple, symmetrical, trachea midline and thyroid not enlarged, symmetric, no tenderness/mass/nodules Lymph nodes: Cervical, supraclavicular, and axillary nodes normal. Heart:regular rate and rhythm, S1, S2 normal, no murmur,  click, rub or gallop Lung:chest clear, no wheezing, rales, normal symmetric air entry Abdomin: soft, non-tender, without masses or organomegaly EXT:no erythema, induration, or nodules   Lab Results: Lab Results  Component Value Date   WBC 6.6 07/03/2013   HGB 12.8* 07/03/2013   HCT 37.2* 07/03/2013   MCV 92.7 07/03/2013   PLT 205 07/03/2013     Chemistry      Component Value Date/Time   NA 137 07/03/2013 1030   NA 138 10/29/2011 0530   K 4.8 07/03/2013 1030   K 4.5 10/29/2011 0530   CL 105 10/29/2011 0530   CO2 22 07/03/2013 1030   CO2 23 10/29/2011 0530   BUN 21.7 07/03/2013 1030   BUN 23 10/29/2011 0530   CREATININE 2.4* 07/03/2013 1030   CREATININE 1.71* 10/29/2011 0530   CREATININE 2.12* 07/07/2011 1714      Component Value Date/Time   CALCIUM 9.8 07/03/2013 1030   CALCIUM 9.6 10/29/2011 0530   ALKPHOS 43 07/03/2013 1030   ALKPHOS 53 10/20/2011 1025   AST 23 07/03/2013 1030   AST 21 10/20/2011 1025   ALT 18 07/03/2013 1030   ALT 17 10/20/2011 1025   BILITOT 0.50 07/03/2013 1030   BILITOT 0.3 10/20/2011 1025     Results for [name], [name] (MRN [id number]) as of 08/03/2013 09:33  Ref. Range 07/03/2013 10:29  M-SPIKE, % No range found NOT DET  SPE Interp. No range found *   Results for [name] (MRN [id number]) as of 08/03/2013 09:33  Ref. Range 07/03/2013 10:29  Kappa free light chain Latest Range: 0.33-1.94 mg/dL 2.50 (H)  Lambda Free Lght Chn Latest Range: 0.57-2.63 mg/dL 1.21  Kappa:Lambda Ratio Latest Range: 0.26-1.65  2.07 (H)   Radiological Studies:  EXAM:  METASTATIC BONE SURVEY  COMPARISON: None.  FINDINGS:  The calvarium demonstrates no lytic nor blastic lesions. The  cervical vertebral bodies are preserved in height. There is mild  disc space narrowing at C4-5. There is exuberant calcification in  the region of the carotid arteries bilaterally. The thoracic and  lumbar spines exhibit no compression fractures nor lytic or blastic  lesions. There is no high-grade  disc space narrowing. There is mild  degenerative facet joint change at L5-S1. The bony pelvis exhibits  no lytic nor blastic lesions. The hip joints exhibit mild  degenerative change. The clavicles and humeri exhibit no lytic or  blastic lesions. The femurs exhibit no lytic or blastic lesions.  There is vascular calcification within the femoral arteries. The  patient has undergone previous left knee joint placement.  IMPRESSION:  1. There is mild degenerative disc space narrowing at C4-5. There is  no evidence of a compression fracture, high-grade disc space  narrowing, or more than mild facet joint degenerative change.  2. There are no myelomatous appearing lesions within the visualized  portions of the skeleton.  3. There are exuberant calcifications within the cervical carotid  arteries bilaterally.  4. The chest film reveals no evidence of active  cardiopulmonary  disease.  Impression and Plan:  73 year old gentleman with the following issues:  1.Elevated free kappa light chain that is mild with a kappa to lambda ratio of 2.07 with the upper limit of normal of 1.65. The differential diagnosis includes a plasma cell disorder such as multiple myeloma, amyloidosis, and a monoclonal gammopathy of undetermined significance. This could also be reactive findings due to his chronic renal insufficiency. His skeletal survey and laboratory testing did not support the diagnosis of active multiple myeloma. The plan at this point is to continue observation and repeat blood testing in about 6 months.  2. Chronically or insufficiency: This could be related to long-standing hypertension and diabetes. I think this is most likely related to his possible plasma cell disorder. His creatinine now is around 2.4.   3. History of rectal cancer: No evidence of recurrence at this time he had surveillance CT scans until 2009. See no indications for it at this time.   Wyatt Portela, MD 5/1/20159:50 AM

## 2013-08-09 ENCOUNTER — Telehealth: Payer: Self-pay | Admitting: Oncology

## 2013-08-09 NOTE — Telephone Encounter (Signed)
lmonvm advising the pt of his nov 2015 appts.

## 2013-10-06 ENCOUNTER — Encounter (HOSPITAL_COMMUNITY): Payer: Self-pay | Admitting: Emergency Medicine

## 2013-10-06 ENCOUNTER — Emergency Department (HOSPITAL_COMMUNITY): Payer: Medicare Other

## 2013-10-06 ENCOUNTER — Emergency Department (HOSPITAL_COMMUNITY)
Admission: EM | Admit: 2013-10-06 | Discharge: 2013-10-06 | Disposition: A | Discharge status: Home / Self Care | Payer: Medicare Other | Attending: Emergency Medicine | Admitting: Emergency Medicine

## 2013-10-06 DIAGNOSIS — Z87448 Personal history of other diseases of urinary system: Secondary | ICD-10-CM | POA: Insufficient documentation

## 2013-10-06 DIAGNOSIS — N183 Chronic kidney disease, stage 3 unspecified: Secondary | ICD-10-CM | POA: Insufficient documentation

## 2013-10-06 DIAGNOSIS — I10 Essential (primary) hypertension: Secondary | ICD-10-CM | POA: Insufficient documentation

## 2013-10-06 DIAGNOSIS — K219 Gastro-esophageal reflux disease without esophagitis: Secondary | ICD-10-CM | POA: Insufficient documentation

## 2013-10-06 DIAGNOSIS — R519 Headache, unspecified: Secondary | ICD-10-CM

## 2013-10-06 DIAGNOSIS — Z8639 Personal history of other endocrine, nutritional and metabolic disease: Secondary | ICD-10-CM | POA: Insufficient documentation

## 2013-10-06 DIAGNOSIS — F3289 Other specified depressive episodes: Secondary | ICD-10-CM | POA: Insufficient documentation

## 2013-10-06 DIAGNOSIS — R51 Headache: Secondary | ICD-10-CM | POA: Insufficient documentation

## 2013-10-06 DIAGNOSIS — E119 Type 2 diabetes mellitus without complications: Secondary | ICD-10-CM | POA: Insufficient documentation

## 2013-10-06 DIAGNOSIS — Z862 Personal history of diseases of the blood and blood-forming organs and certain disorders involving the immune mechanism: Secondary | ICD-10-CM | POA: Insufficient documentation

## 2013-10-06 DIAGNOSIS — Z8673 Personal history of transient ischemic attack (TIA), and cerebral infarction without residual deficits: Secondary | ICD-10-CM | POA: Insufficient documentation

## 2013-10-06 DIAGNOSIS — J42 Unspecified chronic bronchitis: Secondary | ICD-10-CM | POA: Insufficient documentation

## 2013-10-06 DIAGNOSIS — Z8679 Personal history of other diseases of the circulatory system: Secondary | ICD-10-CM | POA: Insufficient documentation

## 2013-10-06 DIAGNOSIS — F329 Major depressive disorder, single episode, unspecified: Secondary | ICD-10-CM | POA: Insufficient documentation

## 2013-10-06 DIAGNOSIS — I2510 Atherosclerotic heart disease of native coronary artery without angina pectoris: Secondary | ICD-10-CM | POA: Insufficient documentation

## 2013-10-06 DIAGNOSIS — Z792 Long term (current) use of antibiotics: Secondary | ICD-10-CM | POA: Insufficient documentation

## 2013-10-06 DIAGNOSIS — E669 Obesity, unspecified: Secondary | ICD-10-CM | POA: Insufficient documentation

## 2013-10-06 DIAGNOSIS — Z8719 Personal history of other diseases of the digestive system: Secondary | ICD-10-CM | POA: Insufficient documentation

## 2013-10-06 DIAGNOSIS — Z85048 Personal history of other malignant neoplasm of rectum, rectosigmoid junction, and anus: Secondary | ICD-10-CM | POA: Insufficient documentation

## 2013-10-06 DIAGNOSIS — Z79899 Other long term (current) drug therapy: Secondary | ICD-10-CM | POA: Insufficient documentation

## 2013-10-06 DIAGNOSIS — Z8551 Personal history of malignant neoplasm of bladder: Secondary | ICD-10-CM | POA: Insufficient documentation

## 2013-10-06 DIAGNOSIS — E7800 Pure hypercholesterolemia, unspecified: Secondary | ICD-10-CM | POA: Insufficient documentation

## 2013-10-06 DIAGNOSIS — Z87891 Personal history of nicotine dependence: Secondary | ICD-10-CM | POA: Insufficient documentation

## 2013-10-06 DIAGNOSIS — M129 Arthropathy, unspecified: Secondary | ICD-10-CM | POA: Insufficient documentation

## 2013-10-06 DIAGNOSIS — D649 Anemia, unspecified: Secondary | ICD-10-CM | POA: Insufficient documentation

## 2013-10-06 DIAGNOSIS — Z85038 Personal history of other malignant neoplasm of large intestine: Secondary | ICD-10-CM | POA: Insufficient documentation

## 2013-10-06 DIAGNOSIS — F0390 Unspecified dementia without behavioral disturbance: Secondary | ICD-10-CM | POA: Insufficient documentation

## 2013-10-06 LAB — CBG MONITORING, ED: Glucose-Capillary: 262 mg/dL — ABNORMAL HIGH (ref 70–99)

## 2013-10-06 MED ORDER — IBUPROFEN 800 MG PO TABS: 800.0000 mg | ORAL_TABLET | Freq: Three times a day (TID) | ORAL | Status: DC | PRN

## 2013-10-06 MED ORDER — IBUPROFEN 800 MG PO TABS
800.0000 mg | ORAL_TABLET | Freq: Once | ORAL | Status: AC
  Administered 2013-10-06: 800 mg via ORAL
  Filled 2013-10-06: qty 1

## 2013-10-06 MED ORDER — AMOXICILLIN 500 MG PO CAPS: 500.0000 mg | ORAL_CAPSULE | Freq: Three times a day (TID) | ORAL | Status: DC

## 2013-10-06 NOTE — ED Notes (Signed)
Pt c/o pain to back of head onset last night while laying in the bed. Pt also c/o pain to right ear. Pt denies dizziness. Pt talking in complete sentences without difficulty, denies weakness.

## 2013-10-06 NOTE — ED Notes (Signed)
Patient transported to CT 

## 2013-10-06 NOTE — ED Provider Notes (Signed)
CSN: [id number]     Arrival date & time 10/06/13  1114 History   First MD Initiated Contact with Patient 10/06/13 1133     Chief Complaint  Patient presents with  . Headache     (Consider location/radiation/quality/duration/timing/severity/associated sxs/prior Treatment) Patient is a 73 y.o. male presenting with headaches. The history is provided by the patient (pt complains of a headache).  Headache Pain location:  Occipital Quality:  Dull Radiates to:  Does not radiate Severity currently:  5/10 Severity at highest:  4/10 Onset quality:  Gradual Progression:  Unchanged Associated symptoms: no abdominal pain, no back pain, no congestion, no cough, no diarrhea, no fatigue, no seizures and no sinus pressure     Past Medical History  Diagnosis Date  . Gout   . Depression   . Diabetes mellitus   . Unspecified essential hypertension   . Hypercholesteremia     IIA  . Colitis, ulcerative     NOS  . Personal hx-rectal/anal malignancy   . Urinary incontinence   . Benign prostatic hypertrophy     S/p TURP   . H/O ETOH abuse 06/06/2011  . 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 Pleurisy   . AAA (abdominal aortic aneurysm) 1992    S/p repair and grafting  . History of alcohol abuse   . CAD (coronary artery disease)   . Stroke     73 yrs old  . Stones in the urinary tract   . GERD (gastroesophageal reflux disease)   . Normocytic anemia     Chronic  . PAF (paroxysmal atrial fibrillation)     during hospitalization 06/2011 for sepsis  . Cardiomyopathy, ischemic     EF 40-45% by echo 06/2011  . Bladder cancer 2006  . Bladder cancer     Transitional Cell  . Colon cancer   . Chronic bronchitis     "used to get it quite often; found out medicine was causing it"  . Arthritis     "left knee"  . Memory disorder 04/23/2013  . Obesity   . Dementia    Past Surgical History  Procedure Laterality Date  . Transurethral resection of prostate     . Abdominal aortic aneurysm repair  1992    "had 3 blockages; 3 aneurysms repaired at the same time"  . I&d knee with poly exchange  06/12/2011    Procedure: IRRIGATION AND DEBRIDEMENT KNEE WITH POLY EXCHANGE;  Surgeon: Yvette Rack., MD;  Location: [facility];  Service: Orthopedics;  Laterality: Left;  . Appendectomy    . Eye surgery    . Coronary angioplasty      1992  . Colonoscopy w/ polypectomy    . Cataract extraction w/ intraocular lens  implant, bilateral  ~ 2010  . Total knee arthroplasty  1990's    left  . Joint replacement      left knee  . Total knee revision  10/26/11    left  . Total knee revision  10/26/2011    Procedure: TOTAL KNEE REVISION;  Surgeon: Garald Balding, MD;  Location: [place];  Service: Orthopedics;  Laterality: Left;  left total knee revision  . Angioplasty    . Bladder tumor excision  2002  . Compound fracture ankle  08/17/2003   Family History  Problem Relation Age of Onset  . Dementia Mother   . COPD Father   . Anesthesia problems Neg Hx   . Dementia Sister   .  Cancer Brother   . Cancer Brother   . Dementia Brother    History  Substance Use Topics  . Smoking status: Former Smoker -- 2.00 packs/day for 12 years    Types: Cigarettes    Quit date: 06/04/1970  . Smokeless tobacco: Current User    Types: Chew  . Alcohol Use: Yes     Comment: Occasional    Review of Systems  Constitutional: Negative for appetite change and fatigue.  HENT: Negative for congestion, ear discharge and sinus pressure.   Eyes: Negative for discharge.  Respiratory: Negative for cough.   Cardiovascular: Negative for chest pain.  Gastrointestinal: Negative for abdominal pain and diarrhea.  Genitourinary: Negative for frequency and hematuria.  Musculoskeletal: Negative for back pain.  Skin: Negative for rash.  Neurological: Positive for headaches. Negative for seizures.  Psychiatric/Behavioral: Negative for hallucinations.      Allergies  Fentanyl;  Hydromorphone hcl; Lorazepam; Lisinopril; Oxycodone-acetaminophen; and Vicodin  Home Medications   Prior to Admission medications   Medication Sig Start Date End Date Taking? Authorizing Provider  albuterol (PROVENTIL HFA;VENTOLIN HFA) 108 (90 BASE) MCG/ACT inhaler Inhale 2 puffs into the lungs every 6 (six) hours as needed for wheezing or shortness of breath.   Yes Historical Provider, MD  diltiazem (CARDIZEM CD) 240 MG 24 hr capsule Take 240 mg by mouth daily. 04/08/13  Yes Historical Provider, MD  donepezil (ARICEPT) 10 MG tablet Take 10 mg by mouth at bedtime.   Yes Historical Provider, MD  fenofibrate micronized (LOFIBRA) 134 MG capsule Take 145 mg by mouth daily.  03/24/13  Yes Historical Provider, MD  Ferrous Fumarate (HIGH POTENCY IRON) 86 (27 FE) MG CAPS Take 27 mg by mouth daily.   Yes Historical Provider, MD  Insulin Glargine (LANTUS SOLOSTAR) 100 UNIT/ML Solostar Pen Inject 48 Units into the skin at bedtime.   Yes Historical Provider, MD  metoprolol tartrate (LOPRESSOR) 25 MG tablet Take 25 mg by mouth 2 (two) times daily. 06/24/11  Yes Radene Gunning, NP  Multiple Vitamin (MULTIVITAMIN) tablet Take 1 tablet by mouth daily.     Yes Historical Provider, MD  NOVOLOG FLEXPEN 100 UNIT/ML FlexPen Inject 100 Units into the skin 3 (three) times daily - between meals and at bedtime. 16 UNITS AM, 20 UNITS at LUNCH, 14 UNITS PM 01/20/13  Yes Historical Provider, MD  pantoprazole (PROTONIX) 40 MG tablet Take 40 mg by mouth daily at 12 noon. 06/24/11  Yes Lezlie Octave Black, NP  PARoxetine (PAXIL) 20 MG tablet Take 10-20 mg by mouth 2 (two) times daily. 1 tab in am, 0.5 tab in pm   Yes Historical Provider, MD  rosuvastatin (CRESTOR) 20 MG tablet Take 20 mg by mouth at bedtime.   Yes Historical Provider, MD  amoxicillin (AMOXIL) 500 MG capsule Take 1 capsule (500 mg total) by mouth 3 (three) times daily. 10/06/13   Maudry Diego, MD  ibuprofen (ADVIL,MOTRIN) 800 MG tablet Take 1 tablet (800 mg total) by  mouth every 8 (eight) hours as needed for headache. 10/06/13   Maudry Diego, MD   BP 152/72  Pulse 69  Temp(Src) 97.6 F (36.4 C) (Oral)  Resp 10  Ht 6' (1.829 m)  Wt 250 lb (113.399 kg)  BMI 33.90 kg/m2  SpO2 94% Physical Exam  Constitutional: He is oriented to person, place, and time. He appears well-developed.  HENT:  Head: Normocephalic.  Eyes: Conjunctivae and EOM are normal. No scleral icterus.  Neck: Neck supple. No thyromegaly present.  Cardiovascular: Normal rate and regular rhythm.  Exam reveals no gallop and no friction rub.   No murmur heard. Pulmonary/Chest: No stridor. He has no wheezes. He has no rales. He exhibits no tenderness.  Abdominal: He exhibits no distension. There is no tenderness. There is no rebound.  Musculoskeletal: Normal range of motion. He exhibits no edema.  Lymphadenopathy:    He has no cervical adenopathy.  Neurological: He is oriented to person, place, and time. He exhibits normal muscle tone. Coordination normal.  Skin: No rash noted. No erythema.  Psychiatric: He has a normal mood and affect. His behavior is normal.    ED Course  Procedures (including critical care time) Labs Review Labs Reviewed  CBG MONITORING, ED - Abnormal; Notable for the following:    Glucose-Capillary 262 (*)    All other components within normal limits    Imaging Review Ct Head Wo Contrast  10/06/2013   CLINICAL DATA:  Headache.  EXAM: CT HEAD WITHOUT CONTRAST  TECHNIQUE: Contiguous axial images were obtained from the base of the skull through the vertex without intravenous contrast.  COMPARISON:  06/08/2011.  FINDINGS: Stable age related cerebral atrophy, ventriculomegaly and periventricular white matter disease. Remote infarcts are noted in the left internal capsule region and right temporal lobe. No extra-axial fluid collections are identified. No CT findings for acute hemispheric infarction or intracranial hemorrhage. No mass lesions. The brainstem and  cerebellum are normal.  The bony structures are intact. No acute skull fracture or bone lesion. There is scattered ethmoid and right half sphenoid sinus disease. There are also bilateral mastoid effusions and fluid in the right middle ear cavity. Stable intracranial vascular calcifications.  IMPRESSION: 1. Stable age related cerebral atrophy, ventriculomegaly and periventricular white matter disease. 2. Remote infarcts. No definite CT findings for acute infarction or intracranial hemorrhage and no mass lesions. 3. Scattered paranasal sinus disease and bilateral mastoid effusions. Fluid also noted in the right middle ear cavity.   Electronically Signed   By: Kalman Jewels M.D.   On: 10/06/2013 12:25     EKG Interpretation None      MDM   Final diagnoses:  Headache, unspecified headache type   Sinusitis,  Headache witll tx with motrin and amox.  Follow up pcp    Maudry Diego, MD 10/06/13 1325

## 2013-10-06 NOTE — Discharge Instructions (Signed)
Follow up with your md next week. °

## 2013-10-22 ENCOUNTER — Ambulatory Visit: Payer: Medicare Other | Admitting: Nurse Practitioner

## 2013-12-25 ENCOUNTER — Ambulatory Visit (INDEPENDENT_AMBULATORY_CARE_PROVIDER_SITE_OTHER): Payer: Medicare Other | Admitting: Neurology

## 2013-12-25 ENCOUNTER — Encounter: Payer: Self-pay | Admitting: Neurology

## 2013-12-25 VITALS — BP 138/80 | HR 68 | Wt 258.0 lb

## 2013-12-25 DIAGNOSIS — R413 Other amnesia: Secondary | ICD-10-CM
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
## 2014-01-25 ENCOUNTER — Other Ambulatory Visit: Payer: Self-pay | Admitting: Nurse Practitioner

## 2014-02-04 ENCOUNTER — Encounter: Payer: Self-pay | Admitting: *Deleted

## 2014-02-05 ENCOUNTER — Other Ambulatory Visit (HOSPITAL_BASED_OUTPATIENT_CLINIC_OR_DEPARTMENT_OTHER): Payer: Medicare Other

## 2014-02-05 DIAGNOSIS — D472 Monoclonal gammopathy: Secondary | ICD-10-CM

## 2014-02-05 LAB — CBC WITH DIFFERENTIAL/PLATELET
BASO%: 1.2 % (ref 0.0–2.0)
Basophils Absolute: 0.1 10*3/uL (ref 0.0–0.1)
EOS%: 6.9 % (ref 0.0–7.0)
Eosinophils Absolute: 0.4 10*3/uL (ref 0.0–0.5)
HCT: 38.4 % (ref 38.4–49.9)
HGB: 12.9 g/dL — ABNORMAL LOW (ref 13.0–17.1)
LYMPH%: 28.8 % (ref 14.0–49.0)
MCH: 30.7 pg (ref 27.2–33.4)
MCHC: 33.5 g/dL (ref 32.0–36.0)
MCV: 91.6 fL (ref 79.3–98.0)
MONO#: 0.4 10*3/uL (ref 0.1–0.9)
MONO%: 6.7 % (ref 0.0–14.0)
NEUT#: 3.3 10*3/uL (ref 1.5–6.5)
NEUT%: 56.4 % (ref 39.0–75.0)
PLATELETS: 209 10*3/uL (ref 140–400)
RBC: 4.19 10*6/uL — AB (ref 4.20–5.82)
RDW: 13.2 % (ref 11.0–14.6)
WBC: 5.9 10*3/uL (ref 4.0–10.3)
lymph#: 1.7 10*3/uL (ref 0.9–3.3)

## 2014-02-05 LAB — COMPREHENSIVE METABOLIC PANEL (CC13)
ALK PHOS: 53 U/L (ref 40–150)
ALT: 22 U/L (ref 0–55)
AST: 22 U/L (ref 5–34)
Albumin: 4 g/dL (ref 3.5–5.0)
Anion Gap: 10 mEq/L (ref 3–11)
BUN: 26.9 mg/dL — ABNORMAL HIGH (ref 7.0–26.0)
CO2: 21 mEq/L — ABNORMAL LOW (ref 22–29)
CREATININE: 2.5 mg/dL — AB (ref 0.7–1.3)
Calcium: 9.7 mg/dL (ref 8.4–10.4)
Chloride: 102 mEq/L (ref 98–109)
Glucose: 476 mg/dl — ABNORMAL HIGH (ref 70–140)
Potassium: 4.9 mEq/L (ref 3.5–5.1)
SODIUM: 133 meq/L — AB (ref 136–145)
TOTAL PROTEIN: 6.8 g/dL (ref 6.4–8.3)
Total Bilirubin: 0.49 mg/dL (ref 0.20–1.20)

## 2014-02-07 LAB — SPEP & IFE WITH QIG
ALPHA-1-GLOBULIN: 4.1 % (ref 2.9–4.9)
ALPHA-2-GLOBULIN: 10.6 % (ref 7.1–11.8)
Albumin ELP: 64.1 % (ref 55.8–66.1)
Beta 2: 5.3 % (ref 3.2–6.5)
Beta Globulin: 6.4 % (ref 4.7–7.2)
Gamma Globulin: 9.5 % — ABNORMAL LOW (ref 11.1–18.8)
IgA: 162 mg/dL (ref 68–379)
IgG (Immunoglobin G), Serum: 560 mg/dL — ABNORMAL LOW (ref 650–1600)
IgM, Serum: 63 mg/dL (ref 41–251)
TOTAL PROTEIN, SERUM ELECTROPHOR: 6.7 g/dL (ref 6.0–8.3)

## 2014-02-07 LAB — KAPPA/LAMBDA LIGHT CHAINS
KAPPA FREE LGHT CHN: 2.9 mg/dL — AB (ref 0.33–1.94)
Kappa:Lambda Ratio: 2.42 — ABNORMAL HIGH (ref 0.26–1.65)
Lambda Free Lght Chn: 1.2 mg/dL (ref 0.57–2.63)

## 2014-02-12 ENCOUNTER — Telehealth: Payer: Self-pay | Admitting: Oncology

## 2014-02-12 ENCOUNTER — Ambulatory Visit (HOSPITAL_BASED_OUTPATIENT_CLINIC_OR_DEPARTMENT_OTHER): Payer: Medicare Other | Admitting: Oncology

## 2014-02-12 VITALS — BP 135/57 | HR 63 | Temp 98.4°F | Resp 18 | Ht 72.0 in | Wt 262.5 lb

## 2014-02-12 DIAGNOSIS — N189 Chronic kidney disease, unspecified: Secondary | ICD-10-CM

## 2014-02-12 DIAGNOSIS — D472 Monoclonal gammopathy: Secondary | ICD-10-CM

## 2014-02-12 DIAGNOSIS — D7289 Other specified disorders of white blood cells: Secondary | ICD-10-CM

## 2014-02-12 NOTE — Telephone Encounter (Signed)
Pt confirmed labs/ov per 11/10 POF, gave pt AVS...... KJ °

## 2014-02-12 NOTE — Progress Notes (Signed)
Hematology and Oncology Follow Up Visit  [name] [id number] 1941-01-27 73 y.o. 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 Diagnosed in March of 2015. He also has a history of remote rectal cancer that is not active.   Current therapy: Observation and surveillance.  Interim History:  [name] presents today for a followup visit. Since his last visit, he continues to be asymptomatic. He has not reported any bone pain such as back, shoulder or hips. He is not reporting any constitutional symptoms of weight loss or appetite changes. He does have chronic renal insufficiency which remained relatively stable. He does report obstructive urinary symptoms which has been chronic in nature and have had a TURP in the past. Overall performance status and activity level remain about the same. He continues to drive and attends to activities of daily living. 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 He does not report any chest pain, shortness of breath or difficulty breathing. He does not report any lower extremity swelling. Rest of his review of systems unremarkable.  Medications: I have reviewed the patient's current medications.  Current Outpatient Prescriptions  Medication Sig Dispense Refill  . albuterol (PROVENTIL HFA;VENTOLIN HFA) 108 (90 BASE) MCG/ACT inhaler Inhale 2 puffs into the lungs every 6 (six) hours as needed for wheezing or shortness of breath.    . diltiazem (CARDIZEM CD) 240 MG 24 hr capsule Take 240 mg by mouth daily.    Marland Kitchen donepezil (ARICEPT) 10 MG tablet TAKE ONE TABLET BY MOUTH AT BEDTIME 30 tablet 6  . fenofibrate micronized (LOFIBRA) 134 MG capsule Take 145 mg by mouth daily.     . Ferrous Fumarate (HIGH POTENCY IRON) 86 (27 FE) MG CAPS Take 27 mg by mouth daily.    . Insulin Glargine (LANTUS SOLOSTAR) 100 UNIT/ML Solostar Pen Inject 48 Units into the skin at bedtime.     . metoprolol tartrate (LOPRESSOR) 25 MG tablet Take 25 mg by mouth 2 (two) times daily.    . Multiple Vitamin (MULTIVITAMIN) tablet Take 1 tablet by mouth daily.      Marland Kitchen NOVOLOG FLEXPEN 100 UNIT/ML FlexPen Inject 100 Units into the skin 3 (three) times daily - between meals and at bedtime. 16 UNITS AM, 20 UNITS at LUNCH, 14 UNITS PM    . pantoprazole (PROTONIX) 40 MG tablet Take 40 mg by mouth daily at 12 noon.    Marland Kitchen PARoxetine (PAXIL) 20 MG tablet Take 20 mg by mouth daily.     . rosuvastatin (CRESTOR) 20 MG tablet Take 20 mg by mouth at bedtime.     No current facility-administered medications for this visit.     Allergies:  Allergies  Allergen Reactions  . Fentanyl Other (See Comments)    Delirium  . Hydromorphone Hcl Other (See Comments)    Delirium  . Lorazepam Other (See Comments)    Delirium  . Lisinopril   . Oxycodone-Acetaminophen     Delirium  . Vicodin [Hydrocodone-Acetaminophen]     Past Medical History, Surgical history, Social history, and Family History were reviewed and updated.   Physical Exam: Blood pressure 135/57, pulse 63, temperature 98.4 F (36.9 C), temperature source Oral, resp. rate 18, height 6' (1.829 m), weight 262 lb 8 oz (119.069 kg). ECOG:  1 General appearance: alert and cooperative Head: Normocephalic, without obvious abnormality, atraumatic Neck: no adenopathy Lymph nodes: Cervical, supraclavicular, and axillary nodes normal. Heart:regular rate and  rhythm, S1, S2 normal, no murmur, click, rub or gallop Lung:chest clear, no wheezing, rales, normal symmetric air entry Abdomin: soft, non-tender, without masses or organomegaly EXT:no erythema, induration, or nodules   Lab Results: Lab Results  Component Value Date   WBC 5.9 02/05/2014   HGB 12.9* 02/05/2014   HCT 38.4 02/05/2014   MCV 91.6 02/05/2014   PLT 209 02/05/2014     Chemistry      Component Value Date/Time   NA 133* 02/05/2014 1043   NA 138 10/29/2011 0530   K 4.9  02/05/2014 1043   K 4.5 10/29/2011 0530   CL 105 10/29/2011 0530   CO2 21* 02/05/2014 1043   CO2 23 10/29/2011 0530   BUN 26.9* 02/05/2014 1043   BUN 23 10/29/2011 0530   CREATININE 2.5* 02/05/2014 1043   CREATININE 1.71* 10/29/2011 0530   CREATININE 2.12* 07/07/2011 1714      Component Value Date/Time   CALCIUM 9.7 02/05/2014 1043   CALCIUM 9.6 10/29/2011 0530   ALKPHOS 53 02/05/2014 1043   ALKPHOS 53 10/20/2011 1025   AST 22 02/05/2014 1043   AST 21 10/20/2011 1025   ALT 22 02/05/2014 1043   ALT 17 10/20/2011 1025   BILITOT 0.49 02/05/2014 1043   BILITOT 0.3 10/20/2011 1025       Results for APOLO, CUTSHAW (MRN [id number]) as of 02/12/2014 10:08  Ref. Range 07/03/2013 10:29 02/05/2014 10:41  M-SPIKE, % No range found NOT DET NOT DET  SPE Interp. 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 The differential diagnosis includes a plasma cell disorder such as multiple myeloma, amyloidosis, and a monoclonal gammopathy of undetermined significance. This could also be reactive findings due to his chronic renal insufficiency. His skeletal survey and laboratory testing did not support the diagnosis of active multiple myeloma. Repeat your testing from 02/05/2014 was discussed and showed no major changes in his light chains. The plan is to continue with active surveillance and repeat  protein studies and 9 months. If these changed dramatically, we will restage him and consider bone marrow biopsy.  2. Chronically or insufficiency: This could be related to long-standing hypertension and diabetes. I think this is most likely related to his possible plasma cell disorder. His creatinine now is around 2.5 which has been stable.   3. History of rectal cancer: No evidence of recurrence at this time he had surveillance CT scans until 2009. See no indications to repeat any imaging studies.   ZNBVAP,OLIDC, MD 11/10/201510:46 AM

## 2014-02-18 ENCOUNTER — Encounter: Payer: Self-pay | Admitting: Neurology

## 2014-04-24 DIAGNOSIS — R3989 Other symptoms and signs involving the genitourinary system: Secondary | ICD-10-CM | POA: Diagnosis not present

## 2014-04-24 DIAGNOSIS — R81 Glycosuria: Secondary | ICD-10-CM | POA: Diagnosis not present

## 2014-04-24 DIAGNOSIS — R300 Dysuria: Secondary | ICD-10-CM | POA: Diagnosis not present

## 2014-05-31 ENCOUNTER — Encounter: Payer: Self-pay | Admitting: Neurology

## 2014-05-31 ENCOUNTER — Telehealth: Payer: Self-pay | Admitting: *Deleted

## 2014-05-31 NOTE — Telephone Encounter (Signed)
I called the patient, talk with the wife. The patient does have dementia associated with an executive function disorder. I will write a letter for the patient.
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
## 2014-06-18 DIAGNOSIS — I10 Essential (primary) hypertension: Secondary | ICD-10-CM | POA: Diagnosis not present

## 2014-06-18 DIAGNOSIS — E1129 Type 2 diabetes mellitus with other diabetic kidney complication: Secondary | ICD-10-CM | POA: Diagnosis not present

## 2014-06-18 DIAGNOSIS — N184 Chronic kidney disease, stage 4 (severe): Secondary | ICD-10-CM | POA: Diagnosis not present

## 2014-06-18 DIAGNOSIS — Z1389 Encounter for screening for other disorder: Secondary | ICD-10-CM | POA: Diagnosis not present

## 2014-06-18 DIAGNOSIS — E782 Mixed hyperlipidemia: Secondary | ICD-10-CM | POA: Diagnosis not present

## 2014-06-25 ENCOUNTER — Ambulatory Visit: Payer: Medicare Other | Admitting: Nurse Practitioner

## 2014-06-26 ENCOUNTER — Encounter: Payer: Self-pay | Admitting: Neurology

## 2014-06-26 ENCOUNTER — Ambulatory Visit (INDEPENDENT_AMBULATORY_CARE_PROVIDER_SITE_OTHER): Payer: Medicare Other | Admitting: Neurology

## 2014-06-26 VITALS — BP 123/67 | HR 72 | Ht 72.0 in | Wt 251.8 lb

## 2014-06-26 DIAGNOSIS — R413 Other amnesia: Secondary | ICD-10-CM

## 2014-06-26 DIAGNOSIS — R269 Unspecified abnormalities of gait and mobility: Secondary | ICD-10-CM | POA: Diagnosis not present

## 2014-06-26 MED ORDER — DONEPEZIL HCL 10 MG PO TABS: 10.0000 mg | ORAL_TABLET | Freq: Every day | ORAL | Status: DC

## 2014-06-26 NOTE — Progress Notes (Signed)
Reason for visit: Memory disorder  [name] is an 74 y.o. male  History of present illness:  [name] is a 74 year old right-handed white male with a history of a progressive dementing illness. The patient has done well since last seen. He still has had some legal issues, he went into a restaurant that he was excluded from, and we had to write a note indicating that he has a memory disorder, he did not remember that he had a order to stay away from that business. The patient has not had any further episodes of agitation. He is on Paxil, and he remains on Aricept 10 mg daily. The patient continues to operate a motor vehicle, he is not had any safety issues recently. He has significant degenerative arthritis involving the left leg, he has a mild gait disorder associated with this, he uses a cane for ambulation. He denies any falls. He has not had any difficulty with tolerating the Aricept, without weight loss or diarrhea.  Past Medical History  Diagnosis Date  . Gout   . Depression   . Diabetes mellitus   . Unspecified essential hypertension   . Hypercholesteremia     IIA  . Colitis, ulcerative     NOS  . Personal hx-rectal/anal malignancy   . Urinary incontinence   . Benign prostatic hypertrophy     S/p TURP   . H/O ETOH abuse 06/06/2011  . 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 Pleurisy   . AAA (abdominal aortic aneurysm) 1992    S/p repair and grafting  . History of alcohol abuse   . CAD (coronary artery disease)   . Stroke     74 yrs old  . Stones in the urinary tract   . GERD (gastroesophageal reflux disease)   . Normocytic anemia     Chronic  . PAF (paroxysmal atrial fibrillation)     during hospitalization 06/2011 for sepsis  . Cardiomyopathy, ischemic     EF 40-45% by echo 06/2011  . Bladder cancer 2006  . Bladder cancer     Transitional Cell  . Colon cancer   . Chronic bronchitis     "used to get it quite often;  found out medicine was causing it"  . Arthritis     "left knee"  . Memory disorder 04/23/2013  . Obesity   . Dementia     Past Surgical History  Procedure Laterality Date  . Transurethral resection of prostate    . Abdominal aortic aneurysm repair  1992    "had 3 blockages; 3 aneurysms repaired at the same time"  . I&d knee with poly exchange  06/12/2011    Procedure: IRRIGATION AND DEBRIDEMENT KNEE WITH POLY EXCHANGE;  Surgeon: Yvette Rack., MD;  Location: [facility];  Service: Orthopedics;  Laterality: Left;  . Appendectomy    . Eye surgery    . Coronary angioplasty      1992  . Colonoscopy w/ polypectomy    . Cataract extraction w/ intraocular lens  implant, bilateral  ~ 2010  . Total knee arthroplasty  1990's    left  . Joint replacement      left knee  . Total knee revision  10/26/11    left  . Total knee revision  10/26/2011    Procedure: TOTAL KNEE REVISION;  Surgeon: Garald Balding, MD;  Location: [facility];  Service: Orthopedics;  Laterality: Left;  left total knee revision  .  Angioplasty    . Bladder tumor excision  2002  . Compound fracture ankle  08/17/2003    Family History  Problem Relation Age of Onset  . Dementia Mother   . COPD Father   . Anesthesia problems Neg Hx   . Dementia Sister   . Cancer Brother   . Cancer Brother   . Dementia Brother     Social history:  reports that he quit smoking about 44 years ago. His smoking use included Cigarettes. He has a 24 pack-year smoking history. His smokeless tobacco use includes Chew. He reports that he does not drink alcohol or use illicit drugs.    Allergies  Allergen Reactions  . Fentanyl Other (See Comments)    Delirium  . Hydromorphone Hcl Other (See Comments)    Delirium  . Lorazepam Other (See Comments)    Delirium  . Lisinopril   . Oxycodone-Acetaminophen     Delirium  . Vicodin [Hydrocodone-Acetaminophen]     Medications:  Prior to Admission medications   Medication Sig Start Date End Date  Taking? Authorizing Provider  albuterol (PROVENTIL HFA;VENTOLIN HFA) 108 (90 BASE) MCG/ACT inhaler Inhale 2 puffs into the lungs every 6 (six) hours as needed for wheezing or shortness of breath.   Yes Historical Provider, MD  aspirin EC 81 MG tablet Take 81 mg by mouth daily.   Yes Historical Provider, MD  diltiazem (CARDIZEM CD) 240 MG 24 hr capsule Take 240 mg by mouth daily. 04/08/13  Yes Historical Provider, MD  donepezil (ARICEPT) 10 MG tablet TAKE ONE TABLET BY MOUTH AT BEDTIME 01/25/14  Yes Kathrynn Ducking, MD  fenofibrate (TRICOR) 145 MG tablet Take 145 mg by mouth daily.   Yes Historical Provider, MD  Ferrous Fumarate (HIGH POTENCY IRON) 86 (27 FE) MG CAPS Take 27 mg by mouth daily.   Yes Historical Provider, MD  Insulin Glargine (LANTUS SOLOSTAR) 100 UNIT/ML Solostar Pen Inject 48 Units into the skin at bedtime.   Yes Historical Provider, MD  metoprolol tartrate (LOPRESSOR) 25 MG tablet Take 25 mg by mouth 2 (two) times daily. 06/24/11  Yes Radene Gunning, NP  Multiple Vitamin (MULTIVITAMIN) tablet Take 1 tablet by mouth daily.     Yes Historical Provider, MD  NOVOLOG FLEXPEN 100 UNIT/ML FlexPen Inject 100 Units into the skin 3 (three) times daily - between meals and at bedtime. 16 UNITS AM, 20 UNITS at LUNCH, 14 UNITS PM 01/20/13  Yes Historical Provider, MD  Omega-3 Fatty Acids (FISH OIL) 1200 MG CAPS Take 1,200 mg by mouth 2 (two) times daily.   Yes Historical Provider, MD  pantoprazole (PROTONIX) 40 MG tablet Take 40 mg by mouth daily at 12 noon. 06/24/11  Yes Lezlie Octave Black, NP  PARoxetine (PAXIL) 20 MG tablet Take 20 mg by mouth daily.    Yes Historical Provider, MD  rosuvastatin (CRESTOR) 20 MG tablet Take 20 mg by mouth at bedtime.   Yes Historical Provider, MD    ROS:  Out of a complete 14 system review of symptoms, the patient complains only of the following symptoms, and all other reviewed systems are negative.  Runny nose Painful urination Achy muscles, walking  difficulty Tremors Confusion  Blood pressure 123/67, pulse 72, height 6' (1.829 m), weight 251 lb 12.8 oz (114.216 kg).  Physical Exam  General: The patient is alert and cooperative at the time of the examination. The patient is moderately obese.  Skin: No significant peripheral edema is noted.   Neurologic Exam  Mental status: The Mini-Mental Status Examination done today shows a total score 25/30.   Cranial nerves: Facial symmetry is present. Speech is normal, no aphasia or dysarthria is noted. Extraocular movements are full. Visual fields are full.  Motor: The patient has good strength in all 4 extremities.  Sensory examination: Soft touch sensation is symmetric on the face, arms, and legs.  Coordination: The patient has good finger-nose-finger and heel-to-shin bilaterally. Mild apraxia with the use of the lower extremities is noted.  Gait and station: The patient has a slightly wide-based gait, slightly unsteady. Tandem gait is unsteady. The patient walks with a cane. Romberg is negative. No drift is seen.  Reflexes: Deep tendon reflexes are symmetric.   Assessment/Plan:  1. Memory disorder  2. Mild gait disorder  3. Degenerative arthritis, left knee  The patient is relatively stable with his memory issue. The patient continues to operate a motor vehicle, this will need to be scrutinized closely. The patient will remain on Aricept for now, he will follow-up in 6-7 months. A prescription was written for the Aricept. The patient is relatively stable with his walking as long as he uses a cane.  Jill Alexanders MD 06/26/2014 8:27 AM  [facility] [address] [place], [place] [address]  Phone [phone] Fax [phone]

## 2014-08-20 ENCOUNTER — Telehealth: Payer: Self-pay | Admitting: Oncology

## 2014-08-20 NOTE — Telephone Encounter (Signed)
Called and left a message to move his 7/12 appointment to 7/20

## 2014-08-26 DIAGNOSIS — Z932 Ileostomy status: Secondary | ICD-10-CM | POA: Diagnosis not present

## 2014-09-18 DIAGNOSIS — E1129 Type 2 diabetes mellitus with other diabetic kidney complication: Secondary | ICD-10-CM | POA: Diagnosis not present

## 2014-09-18 DIAGNOSIS — N184 Chronic kidney disease, stage 4 (severe): Secondary | ICD-10-CM | POA: Diagnosis not present

## 2014-09-18 DIAGNOSIS — I10 Essential (primary) hypertension: Secondary | ICD-10-CM | POA: Diagnosis not present

## 2014-09-18 DIAGNOSIS — E782 Mixed hyperlipidemia: Secondary | ICD-10-CM | POA: Diagnosis not present

## 2014-09-18 DIAGNOSIS — Z6834 Body mass index (BMI) 34.0-34.9, adult: Secondary | ICD-10-CM | POA: Diagnosis not present

## 2014-09-23 DIAGNOSIS — E875 Hyperkalemia: Secondary | ICD-10-CM | POA: Diagnosis not present

## 2014-09-30 ENCOUNTER — Other Ambulatory Visit: Payer: Self-pay

## 2014-10-08 ENCOUNTER — Other Ambulatory Visit: Payer: Medicare Other

## 2014-10-15 ENCOUNTER — Ambulatory Visit: Payer: Medicare Other | Admitting: Oncology

## 2014-10-18 DIAGNOSIS — Z932 Ileostomy status: Secondary | ICD-10-CM | POA: Diagnosis not present

## 2014-10-18 DIAGNOSIS — E119 Type 2 diabetes mellitus without complications: Secondary | ICD-10-CM | POA: Diagnosis not present

## 2014-10-23 ENCOUNTER — Ambulatory Visit (HOSPITAL_BASED_OUTPATIENT_CLINIC_OR_DEPARTMENT_OTHER): Payer: Medicare Other | Admitting: Oncology

## 2014-10-23 VITALS — BP 128/55 | HR 98 | Temp 98.2°F | Resp 18 | Ht 72.0 in | Wt 251.9 lb

## 2014-10-23 DIAGNOSIS — E119 Type 2 diabetes mellitus without complications: Secondary | ICD-10-CM

## 2014-10-23 DIAGNOSIS — I10 Essential (primary) hypertension: Secondary | ICD-10-CM

## 2014-10-23 DIAGNOSIS — N189 Chronic kidney disease, unspecified: Secondary | ICD-10-CM | POA: Diagnosis not present

## 2014-10-23 DIAGNOSIS — G8929 Other chronic pain: Secondary | ICD-10-CM

## 2014-10-23 DIAGNOSIS — R799 Abnormal finding of blood chemistry, unspecified: Secondary | ICD-10-CM | POA: Diagnosis not present

## 2014-10-23 DIAGNOSIS — M25569 Pain in unspecified knee: Secondary | ICD-10-CM | POA: Diagnosis not present

## 2014-10-23 DIAGNOSIS — D472 Monoclonal gammopathy: Secondary | ICD-10-CM

## 2014-10-23 DIAGNOSIS — Z85048 Personal history of other malignant neoplasm of rectum, rectosigmoid junction, and anus: Secondary | ICD-10-CM

## 2014-10-23 NOTE — Progress Notes (Signed)
Hematology and Oncology Follow Up Visit  [name] [id number] 1940/07/31 74 y.o. 10/23/2014 12:10 PM   Principle Diagnosis: 74 year old with Elevated serum light chain, kappa subtype. 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 He also has a history of remote rectal cancer that is not active.   Current therapy: Observation and surveillance.  Interim History:  [name] presents today for a followup visit. Since his last visit, he continues to do well. He has not reported any new bone pain such as back, shoulder or hips. He does report chronic knee pain which is unchanged. He is not reporting any constitutional symptoms of weight loss or appetite changes. He does have chronic renal insufficiency which remained relatively stable. He does report obstructive urinary symptoms which has been chronic in nature. Overall performance status and activity level remain about the same. He continues to drive and attends to activities of daily living.  He does not report any headaches, blurry vision, syncope or seizures. He does not report any chest pain, shortness of breath or difficulty breathing. He does not report any lower extremity swelling. He does not report any nausea, vomiting, abdominal pain. He does not report any changes bowel habits. He does not report any lymphadenopathy or petechiae. Rest of his review of systems unremarkable.  Medications: I have reviewed the patient's current medications.  Current Outpatient Prescriptions  Medication Sig Dispense Refill  . albuterol (PROVENTIL HFA;VENTOLIN HFA) 108 (90 BASE) MCG/ACT inhaler Inhale 2 puffs into the lungs every 6 (six) hours as needed for wheezing or shortness of breath.    Marland Kitchen aspirin EC 81 MG tablet Take 81 mg by mouth daily.    Marland Kitchen diltiazem (CARDIZEM CD) 240 MG 24 hr capsule Take 240 mg by mouth daily.    Marland Kitchen donepezil (ARICEPT) 10 MG tablet Take 1 tablet (10 mg total) by mouth  at bedtime. 90 tablet 1  . fenofibrate (TRICOR) 145 MG tablet Take 145 mg by mouth daily.    . Ferrous Fumarate (HIGH POTENCY IRON) 86 (27 FE) MG CAPS Take 27 mg by mouth daily.    . Insulin Glargine (LANTUS SOLOSTAR) 100 UNIT/ML Solostar Pen Inject 48 Units into the skin at bedtime.    . metoprolol tartrate (LOPRESSOR) 25 MG tablet Take 25 mg by mouth 2 (two) times daily.    . Multiple Vitamin (MULTIVITAMIN) tablet Take 1 tablet by mouth daily.      Marland Kitchen NOVOLOG FLEXPEN 100 UNIT/ML FlexPen Inject 100 Units into the skin 3 (three) times daily - between meals and at bedtime. 16 UNITS AM, 20 UNITS at LUNCH, 14 UNITS PM    . Omega-3 Fatty Acids (FISH OIL) 1200 MG CAPS Take 1,200 mg by mouth 2 (two) times daily.    . pantoprazole (PROTONIX) 40 MG tablet Take 40 mg by mouth daily at 12 noon.    Marland Kitchen PARoxetine (PAXIL) 20 MG tablet Take 20 mg by mouth daily.     . rosuvastatin (CRESTOR) 20 MG tablet Take 20 mg by mouth at bedtime.     No current facility-administered medications for this visit.     Allergies:  Allergies  Allergen Reactions  . Fentanyl Other (See Comments)    Delirium  . Hydromorphone Hcl Other (See Comments)    Delirium  . Lorazepam Other (See Comments)    Delirium  . Oxycodone-Acetaminophen Other (See Comments)    Delirium  . Lisinopril   . Vicodin [Hydrocodone-Acetaminophen]  Past Medical History, Surgical history, Social history, and Family History were reviewed and updated.   Physical Exam: Blood pressure 128/55, pulse 98, temperature 98.2 F (36.8 C), resp. rate 18, height 6' (1.829 m), weight 251 lb 14.4 oz (114.261 kg). ECOG:  1 General appearance: alert and cooperative not in any distress. Head: Normocephalic, without obvious abnormality Neck: no adenopathy Lymph nodes: Cervical, supraclavicular, and axillary nodes normal. Heart:regular rate and rhythm, S1, S2 normal, no murmur, click, rub or gallop Lung:chest clear, no wheezing, rales, normal symmetric air  entry Abdomin: soft, non-tender, without masses or organomegaly EXT:no erythema, induration, or nodules   Lab Results: Lab Results  Component Value Date   WBC 5.9 02/05/2014   HGB 12.9* 02/05/2014   HCT 38.4 02/05/2014   MCV 91.6 02/05/2014   PLT 209 02/05/2014     Chemistry      Component Value Date/Time   NA 133* 02/05/2014 1043   NA 138 10/29/2011 0530   K 4.9 02/05/2014 1043   K 4.5 10/29/2011 0530   CL 105 10/29/2011 0530   CO2 21* 02/05/2014 1043   CO2 23 10/29/2011 0530   BUN 26.9* 02/05/2014 1043   BUN 23 10/29/2011 0530   CREATININE 2.5* 02/05/2014 1043   CREATININE 1.71* 10/29/2011 0530   CREATININE 2.12* 07/07/2011 1714      Component Value Date/Time   CALCIUM 9.7 02/05/2014 1043   CALCIUM 9.6 10/29/2011 0530   ALKPHOS 53 02/05/2014 1043   ALKPHOS 53 10/20/2011 1025   AST 22 02/05/2014 1043   AST 21 10/20/2011 1025   ALT 22 02/05/2014 1043   ALT 17 10/20/2011 1025   BILITOT 0.49 02/05/2014 1043   BILITOT 0.3 10/20/2011 1025       Results for [name] (MRN [id number]) as of 10/23/2014 12:13  Ref. 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 The differential diagnosis includes a plasma cell disorder such as multiple myeloma, amyloidosis, and a monoclonal gammopathy of undetermined significance. This could also be reactive findings due to his chronic renal insufficiency. His skeletal survey and laboratory testing did not support the diagnosis of active multiple myeloma.   The plan is to continue with active surveillance and repeat protein studies  on an annual basis.  2. Chronically or insufficiency: This could be related to long-standing hypertension and diabetes. I think this is most likely related to his possible plasma cell disorder. His creatinine now is around 2.5 which has been stable.    3. History of rectal cancer: No evidence of recurrence at this time he had surveillance CT scans until 2009. Imaging studies will be repeated as needed.  Zola Button, MD 7/20/201612:10 PM

## 2014-11-11 DIAGNOSIS — C679 Malignant neoplasm of bladder, unspecified: Secondary | ICD-10-CM | POA: Diagnosis not present

## 2014-11-11 DIAGNOSIS — N401 Enlarged prostate with lower urinary tract symptoms: Secondary | ICD-10-CM | POA: Diagnosis not present

## 2014-11-11 DIAGNOSIS — N138 Other obstructive and reflux uropathy: Secondary | ICD-10-CM | POA: Diagnosis not present

## 2014-12-11 DIAGNOSIS — E119 Type 2 diabetes mellitus without complications: Secondary | ICD-10-CM | POA: Diagnosis not present

## 2014-12-11 DIAGNOSIS — Z932 Ileostomy status: Secondary | ICD-10-CM | POA: Diagnosis not present

## 2014-12-19 DIAGNOSIS — E782 Mixed hyperlipidemia: Secondary | ICD-10-CM | POA: Diagnosis not present

## 2014-12-19 DIAGNOSIS — N184 Chronic kidney disease, stage 4 (severe): Secondary | ICD-10-CM | POA: Diagnosis not present

## 2014-12-19 DIAGNOSIS — Z139 Encounter for screening, unspecified: Secondary | ICD-10-CM | POA: Diagnosis not present

## 2014-12-19 DIAGNOSIS — E1129 Type 2 diabetes mellitus with other diabetic kidney complication: Secondary | ICD-10-CM | POA: Diagnosis not present

## 2014-12-19 DIAGNOSIS — I10 Essential (primary) hypertension: Secondary | ICD-10-CM | POA: Diagnosis not present

## 2014-12-19 DIAGNOSIS — Z125 Encounter for screening for malignant neoplasm of prostate: Secondary | ICD-10-CM | POA: Diagnosis not present

## 2015-01-02 DIAGNOSIS — E875 Hyperkalemia: Secondary | ICD-10-CM | POA: Diagnosis not present

## 2015-01-21 ENCOUNTER — Ambulatory Visit: Payer: Medicare Other | Admitting: Neurology

## 2015-01-21 ENCOUNTER — Telehealth: Payer: Self-pay

## 2015-01-21 NOTE — Telephone Encounter (Signed)
Patient did not come to a f/u appointment today.  

## 2015-01-22 ENCOUNTER — Telehealth: Payer: Self-pay | Admitting: Neurology

## 2015-01-22 MED ORDER — DONEPEZIL HCL 10 MG PO TABS
10.0000 mg | ORAL_TABLET | Freq: Every day | ORAL | Status: DC
Start: 2015-01-22 — End: 2015-01-30

## 2015-01-22 NOTE — Telephone Encounter (Addendum)
Pt's wife called stating she was not aware of appt yesterday. Next appt is 10/27. Pt needs refill for  donepezil (ARICEPT) 10 MG tablet . He has enough until Sunday. Please call to [facility]. [facility] in [place] has closed.

## 2015-01-22 NOTE — Telephone Encounter (Signed)
Rx has been sent.  Receipt confirmed by pharmacy.   

## 2015-01-30 ENCOUNTER — Ambulatory Visit (INDEPENDENT_AMBULATORY_CARE_PROVIDER_SITE_OTHER): Payer: Medicare Other | Admitting: Neurology

## 2015-01-30 ENCOUNTER — Encounter: Payer: Self-pay | Admitting: Neurology

## 2015-01-30 VITALS — BP 125/76 | HR 70 | Ht 72.0 in | Wt 250.5 lb

## 2015-01-30 DIAGNOSIS — R413 Other amnesia: Secondary | ICD-10-CM

## 2015-01-30 DIAGNOSIS — R269 Unspecified abnormalities of gait and mobility: Secondary | ICD-10-CM

## 2015-01-30 MED ORDER — DONEPEZIL HCL 10 MG PO TABS: 10.0000 mg | ORAL_TABLET | Freq: Every day | ORAL | Status: DC

## 2015-01-30 NOTE — Progress Notes (Signed)
Reason for visit: Memory disturbance  [name] is an 74 y.o. male  History of present illness:  [name] [name] is a 74 year old right-handed white male with a history of a mild gait disorder. The patient has had a knee surgery on the left, he will occasionally have collapse of the left knee. The patient has fallen on several occasions, the last fall was about 2 weeks prior to this evaluation. The patient did not sustain severe injury. The patient has a memory disturbance, he is on Aricept for this, and has tolerated the medication well. The patient has been able to maintain his cognitive functioning since last seen 6 months ago. The patient still operates a motor vehicle, no problems with directions have been noted. The patient requires some assistance managing his medications, appointments, and keeping up with finances. His wife does these tasks. He returns for an evaluation.  Past Medical History  Diagnosis Date  . Gout   . Depression   . Diabetes mellitus   . Unspecified essential hypertension   . Hypercholesteremia     IIA  . Colitis, ulcerative ([place])     NOS  . Personal hx-rectal/anal malignancy   . Urinary incontinence   . Benign prostatic hypertrophy     S/p TURP   . H/O ETOH abuse 06/06/2011  . Uncontrolled secondary diabetes mellitus with stage 3 CKD (GFR 30-59) ([place]) 06/05/2011  . Pleurisy   . AAA (abdominal aortic aneurysm) ([place]) 1992    S/p repair and grafting  . History of alcohol abuse   . CAD (coronary artery disease)   . Stroke [facility])     74 yrs old  . Stones in the urinary tract   . GERD (gastroesophageal reflux disease)   . Normocytic anemia     Chronic  . PAF (paroxysmal atrial fibrillation) ([place])     during hospitalization 06/2011 for sepsis  . Cardiomyopathy, ischemic     EF 40-45% by echo 06/2011  . Bladder cancer ([place]) 2006  . 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 Colon cancer ([place])   . Chronic bronchitis ([place])     "used to get it quite  often; found out medicine was causing it"  . Arthritis     "left knee"  . Memory disorder 04/23/2013  . Obesity   . Dementia     Past Surgical History  Procedure Laterality Date  . Transurethral resection of prostate    . Abdominal aortic aneurysm repair  1992    "had 3 blockages; 3 aneurysms repaired at the same time"  . I&d knee with poly exchange  06/12/2011    Procedure: IRRIGATION AND DEBRIDEMENT KNEE WITH POLY EXCHANGE;  Surgeon: Yvette Rack., MD;  Location: [facility];  Service: Orthopedics;  Laterality: Left;  . Appendectomy    . Eye surgery    . Coronary angioplasty      1992  . Colonoscopy w/ polypectomy    . Cataract extraction w/ intraocular lens  implant, bilateral  ~ 2010  . Total knee arthroplasty  1990's    left  . Joint replacement      left knee  . Total knee revision  10/26/11    left  . Total knee revision  10/26/2011    Procedure: TOTAL KNEE REVISION;  Surgeon: Garald Balding, MD;  Location: [facility];  Service: Orthopedics;  Laterality: Left;  left total knee revision  . Angioplasty    . Bladder tumor excision  2002  . Compound fracture ankle  08/17/2003    Family History  Problem Relation Age of Onset  . Dementia Mother   . COPD Father   . Anesthesia problems Neg Hx   . Dementia Sister   . Cancer Brother   . Cancer Brother   . Dementia Brother     Social history:  reports that he quit smoking about 44 years ago. His smoking use included Cigarettes. He has a 24 pack-year smoking history. His smokeless tobacco use includes Chew. He reports that he does not drink alcohol or use illicit drugs.    Allergies  Allergen Reactions  . Fentanyl Other (See Comments)    Delirium  . Hydromorphone Hcl Other (See Comments)    Delirium  . Lorazepam Other (See Comments)    Delirium  . Oxycodone-Acetaminophen Other (See Comments)    Delirium  . Lisinopril   . Vicodin [Hydrocodone-Acetaminophen]     Medications:  Prior to Admission medications   Medication  Sig Start Date End Date Taking? Authorizing Provider  albuterol (PROVENTIL HFA;VENTOLIN HFA) 108 (90 BASE) MCG/ACT inhaler Inhale 2 puffs into the lungs every 6 (six) hours as needed for wheezing or shortness of breath.   Yes Historical Provider, MD  aspirin EC 81 MG tablet Take 81 mg by mouth daily.   Yes Historical Provider, MD  diltiazem (CARDIZEM CD) 240 MG 24 hr capsule Take 240 mg by mouth daily. 04/08/13  Yes Historical Provider, MD  donepezil (ARICEPT) 10 MG tablet Take 1 tablet (10 mg total) by mouth at bedtime. 01/30/15  Yes Kathrynn Ducking, MD  fenofibrate (TRICOR) 145 MG tablet Take 145 mg by mouth daily.   Yes Historical Provider, MD  Ferrous Fumarate (HIGH POTENCY IRON) 86 (27 FE) MG CAPS Take 27 mg by mouth daily.   Yes Historical Provider, MD  Insulin Glargine (LANTUS SOLOSTAR) 100 UNIT/ML Solostar Pen Inject 50 Units into the skin at bedtime.    Yes Historical Provider, MD  metoprolol tartrate (LOPRESSOR) 25 MG tablet Take 25 mg by mouth 2 (two) times daily. 06/24/11  Yes Radene Gunning, NP  Multiple Vitamin (MULTIVITAMIN) tablet Take 1 tablet by mouth daily.     Yes Historical Provider, MD  NOVOLOG FLEXPEN 100 UNIT/ML FlexPen Inject 100 Units into the skin 3 (three) times daily - between meals and at bedtime. 16 UNITS AM, 20 UNITS at LUNCH, 14 UNITS PM 01/20/13  Yes Historical Provider, MD  Omega-3 Fatty Acids (FISH OIL) 1200 MG CAPS Take 1,200 mg by mouth 2 (two) times daily.   Yes Historical Provider, MD  pantoprazole (PROTONIX) 40 MG tablet Take 40 mg by mouth daily at 12 noon. 06/24/11  Yes Lezlie Octave Black, NP  PARoxetine (PAXIL) 20 MG tablet Take 20 mg by mouth daily.    Yes Historical Provider, MD  rosuvastatin (CRESTOR) 20 MG tablet Take 20 mg by mouth at bedtime.   Yes Historical Provider, MD    ROS:  Out of a complete 14 system review of symptoms, the patient complains only of the following symptoms, and all other reviewed systems are negative.  Runny nose Achy muscles,  walking difficulty, neck pain Memory loss, weakness Agitation, confusion  Blood pressure 125/76, pulse 70, height 6' (1.829 m), weight 250 lb 8 oz (113.626 kg).  Physical Exam  General: The patient is alert and cooperative at the time of the examination. The patient is moderately obese.  Skin: No significant peripheral edema is noted.   Neurologic Exam  Mental status: The patient is alert and oriented x 2 at the time of the examination (not oriented to date). The Mini-Mental Status Examination done today shows a total score 25/30. The patient is able to name 9 animals in 30 seconds.   Cranial nerves: Facial symmetry is present. Speech is normal, no aphasia or dysarthria is noted. Extraocular movements are full. Visual fields are full.  Motor: The patient has good strength in all 4 extremities.  Sensory examination: Soft touch sensation is symmetric on the face, arms, and legs.  Coordination: The patient has good finger-nose-finger and heel-to-shin bilaterally. Some apraxia is noted with the use of the left upper extremity and both lower extremities.  Gait and station: The patient has a slightly wide-based gait. Tandem gait is unsteady. The patient typically walks with a cane. Romberg is negative. No drift is seen.  Reflexes: Deep tendon reflexes are symmetric.   Assessment/Plan:  1. Memory disturbance  2. Gait disturbance  The patient is roughly stable with his cognitive functioning. We will continue Aricept for now, a prescription was called in. The patient still has some gait instability, he generally does well when he is using his cane. The last fall was associated with him carrying groceries in both hands, not using a cane. He will follow-up in about 8 or 9 months.  Jill Alexanders MD 01/30/2015 8:54 PM  [facility] [address] [place], [place] [address]  Phone [phone] Fax [phone]

## 2015-02-04 DIAGNOSIS — E119 Type 2 diabetes mellitus without complications: Secondary | ICD-10-CM | POA: Diagnosis not present

## 2015-03-05 DIAGNOSIS — Z932 Ileostomy status: Secondary | ICD-10-CM | POA: Diagnosis not present

## 2015-03-05 DIAGNOSIS — E119 Type 2 diabetes mellitus without complications: Secondary | ICD-10-CM | POA: Diagnosis not present

## 2015-03-19 DIAGNOSIS — N184 Chronic kidney disease, stage 4 (severe): Secondary | ICD-10-CM | POA: Diagnosis not present

## 2015-03-19 DIAGNOSIS — Z79899 Other long term (current) drug therapy: Secondary | ICD-10-CM | POA: Diagnosis not present

## 2015-03-19 DIAGNOSIS — Z9181 History of falling: Secondary | ICD-10-CM | POA: Diagnosis not present

## 2015-03-19 DIAGNOSIS — E782 Mixed hyperlipidemia: Secondary | ICD-10-CM | POA: Diagnosis not present

## 2015-03-19 DIAGNOSIS — I10 Essential (primary) hypertension: Secondary | ICD-10-CM | POA: Diagnosis not present

## 2015-03-19 DIAGNOSIS — Z1389 Encounter for screening for other disorder: Secondary | ICD-10-CM | POA: Diagnosis not present

## 2015-03-19 DIAGNOSIS — E1129 Type 2 diabetes mellitus with other diabetic kidney complication: Secondary | ICD-10-CM | POA: Diagnosis not present

## 2015-03-28 DIAGNOSIS — E875 Hyperkalemia: Secondary | ICD-10-CM | POA: Diagnosis not present

## 2015-04-02 DIAGNOSIS — E782 Mixed hyperlipidemia: Secondary | ICD-10-CM | POA: Diagnosis not present

## 2015-04-02 DIAGNOSIS — R109 Unspecified abdominal pain: Secondary | ICD-10-CM | POA: Diagnosis not present

## 2015-04-02 DIAGNOSIS — Z6833 Body mass index (BMI) 33.0-33.9, adult: Secondary | ICD-10-CM | POA: Diagnosis not present

## 2015-04-02 DIAGNOSIS — J309 Allergic rhinitis, unspecified: Secondary | ICD-10-CM | POA: Diagnosis not present

## 2015-04-08 ENCOUNTER — Other Ambulatory Visit: Payer: Self-pay | Admitting: Gastroenterology

## 2015-04-08 DIAGNOSIS — R634 Abnormal weight loss: Secondary | ICD-10-CM

## 2015-04-08 DIAGNOSIS — R1013 Epigastric pain: Secondary | ICD-10-CM

## 2015-04-08 DIAGNOSIS — K317 Polyp of stomach and duodenum: Secondary | ICD-10-CM | POA: Diagnosis not present

## 2015-04-08 DIAGNOSIS — R110 Nausea: Secondary | ICD-10-CM | POA: Diagnosis not present

## 2015-04-14 DIAGNOSIS — Z932 Ileostomy status: Secondary | ICD-10-CM | POA: Diagnosis not present

## 2015-04-14 DIAGNOSIS — E119 Type 2 diabetes mellitus without complications: Secondary | ICD-10-CM | POA: Diagnosis not present

## 2015-04-17 ENCOUNTER — Ambulatory Visit
Admission: RE | Admit: 2015-04-17 | Discharge: 2015-04-17 | Disposition: A | Discharge status: Home / Self Care | Payer: Medicare Other | Source: Ambulatory Visit | Attending: Gastroenterology | Admitting: Gastroenterology

## 2015-04-17 DIAGNOSIS — R634 Abnormal weight loss: Secondary | ICD-10-CM

## 2015-04-17 DIAGNOSIS — R1013 Epigastric pain: Secondary | ICD-10-CM

## 2015-04-22 DIAGNOSIS — Z8711 Personal history of peptic ulcer disease: Secondary | ICD-10-CM | POA: Diagnosis not present

## 2015-04-22 DIAGNOSIS — R110 Nausea: Secondary | ICD-10-CM | POA: Diagnosis not present

## 2015-04-22 DIAGNOSIS — K317 Polyp of stomach and duodenum: Secondary | ICD-10-CM | POA: Diagnosis not present

## 2015-04-22 DIAGNOSIS — D131 Benign neoplasm of stomach: Secondary | ICD-10-CM | POA: Diagnosis not present

## 2015-04-22 DIAGNOSIS — Z09 Encounter for follow-up examination after completed treatment for conditions other than malignant neoplasm: Secondary | ICD-10-CM | POA: Diagnosis not present

## 2015-04-22 DIAGNOSIS — R1013 Epigastric pain: Secondary | ICD-10-CM | POA: Diagnosis not present

## 2015-05-14 DIAGNOSIS — R8271 Bacteriuria: Secondary | ICD-10-CM | POA: Diagnosis not present

## 2015-05-14 DIAGNOSIS — Z0000 Encounter for general adult medical examination without abnormal findings: Secondary | ICD-10-CM | POA: Diagnosis not present

## 2015-06-04 DIAGNOSIS — N183 Chronic kidney disease, stage 3 (moderate): Secondary | ICD-10-CM | POA: Diagnosis not present

## 2015-06-13 DIAGNOSIS — E119 Type 2 diabetes mellitus without complications: Secondary | ICD-10-CM | POA: Diagnosis not present

## 2015-06-13 DIAGNOSIS — Z932 Ileostomy status: Secondary | ICD-10-CM | POA: Diagnosis not present

## 2015-06-18 ENCOUNTER — Encounter: Payer: Self-pay | Admitting: Endocrinology

## 2015-06-18 ENCOUNTER — Ambulatory Visit (INDEPENDENT_AMBULATORY_CARE_PROVIDER_SITE_OTHER): Payer: Medicare Other | Admitting: Endocrinology

## 2015-06-18 VITALS — BP 108/64 | HR 62 | Temp 97.5°F | Resp 12 | Wt 243.0 lb

## 2015-06-18 DIAGNOSIS — Z794 Long term (current) use of insulin: Secondary | ICD-10-CM

## 2015-06-18 DIAGNOSIS — E1122 Type 2 diabetes mellitus with diabetic chronic kidney disease: Secondary | ICD-10-CM | POA: Diagnosis not present

## 2015-06-18 DIAGNOSIS — N184 Chronic kidney disease, stage 4 (severe): Secondary | ICD-10-CM | POA: Diagnosis not present

## 2015-06-18 MED ORDER — INSULIN NPH (HUMAN) (ISOPHANE) 100 UNIT/ML ~~LOC~~ SUSP: 70.0000 [IU] | SUBCUTANEOUS | Status: DC

## 2015-06-18 NOTE — Patient Instructions (Addendum)
good diet and exercise significantly improve the control of your diabetes.  please let me know if you wish to be referred to a dietician.  high blood sugar is very risky to your health.  you should see an eye doctor and dentist every year.  It is very important to get all recommended vaccinations.  controlling your blood pressure and cholesterol drastically reduces the damage diabetes does to your body.  Those who smoke should quit.  please discuss these with your doctor.  check your blood sugar twice a day.  vary the time of day when you check, between before the 3 meals, and at bedtime.  also check if you have symptoms of your blood sugar being too high or too low.  please keep a record of the readings and bring it to your next appointment here (or you can bring the meter itself).  You can write it on any piece of paper.  please call us sooner if your blood sugar goes below 70, or if you have a lot of readings over 200. For now, please change both insulins to NPH, 70 units each morning, and none in the evening.   Please call next week, to tell us how the blood sugar is doing.  Please come back for a follow-up appointment in 1 month.

## 2015-06-18 NOTE — Progress Notes (Signed)
Subjective:    Patient ID: [name], male    DOB: 12-Nov-1940, 75 y.o.   MRN: [id number]  HPI pt states DM was dx'ed in 2007; he has mild if any neuropathy of the lower extremities, but he has associated renal failure; he has been on insulin since approx 2011; I asked wife, who says pt has excessive appetite.  exercise is limited by health problems; he has never had pancreatitis, severe hypoglycemia or DKA.  He takes lantus intermittently, as he says it causes palpitations.   Wife says she would like to give him his insulin, but he refuses to allow this.  She says he often forgets the insulin.  She requests for him to take an inexpensive insulin.   Past Medical History  Diagnosis Date  . Gout   . Depression   . Diabetes mellitus   . Unspecified essential hypertension   . Hypercholesteremia     IIA  . Colitis, ulcerative ([place])     NOS  . Personal hx-rectal/anal malignancy   . Urinary incontinence   . Benign prostatic hypertrophy     S/p TURP   . H/O ETOH abuse 06/06/2011  . Uncontrolled secondary diabetes mellitus with stage 3 CKD (GFR 30-59) ([place]) 06/05/2011  . Pleurisy   . AAA (abdominal aortic aneurysm) ([place]) 1992    S/p repair and grafting  . History of alcohol abuse   . CAD (coronary artery disease)   . Stroke [facility])     75 yrs old  . Stones in the urinary tract   . GERD (gastroesophageal reflux disease)   . Normocytic anemia     Chronic  . PAF (paroxysmal atrial fibrillation) ([place])     during hospitalization 06/2011 for sepsis  . Cardiomyopathy, ischemic     EF 40-45% by echo 06/2011  . Bladder cancer ([place]) 2006  . 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 Colon cancer ([place])   . Chronic bronchitis ([place])     "used to get it quite often; found out medicine was causing it"  . Arthritis     "left knee"  . Memory disorder 04/23/2013  . Obesity   . Dementia     Past Surgical History  Procedure Laterality Date  . Transurethral resection of prostate    .  Abdominal aortic aneurysm repair  1992    "had 3 blockages; 3 aneurysms repaired at the same time"  . I&d knee with poly exchange  06/12/2011    Procedure: IRRIGATION AND DEBRIDEMENT KNEE WITH POLY EXCHANGE;  Surgeon: Yvette Rack., MD;  Location: [facility];  Service: Orthopedics;  Laterality: Left;  . Appendectomy    . Eye surgery    . Coronary angioplasty      1992  . Colonoscopy w/ polypectomy    . Cataract extraction w/ intraocular lens  implant, bilateral  ~ 2010  . Total knee arthroplasty  1990's    left  . Joint replacement      left knee  . Total knee revision  10/26/11    left  . Total knee revision  10/26/2011    Procedure: TOTAL KNEE REVISION;  Surgeon: Garald Balding, MD;  [facility];  Service: Orthopedics;  Laterality: Left;  left total knee revision  . Angioplasty    . Bladder tumor excision  2002  . Compound fracture ankle  08/17/2003    Social History   Social History  . Marital Status: Married  Spouse Name: N/A  . Number of Children: 3  . Years of Education: hs   Occupational History  . Retired    Social History Main Topics  . Smoking status: Former Smoker -- 2.00 packs/day for 12 years    Types: Cigarettes    Quit date: 06/04/1970  . Smokeless tobacco: Current User    Types: Chew  . Alcohol Use: No     Comment: Occasional  . Drug Use: No  . Sexual Activity: Not Currently    Birth Control/ Protection: None   Other Topics Concern  . Not on file   Social History Narrative   Married. Patient lives at home with his wife.   Patient is right handed.   Patient drinks 3 cups of caffeine per day.    Current Outpatient Prescriptions on File Prior to Visit  Medication Sig Dispense Refill  . aspirin EC 81 MG tablet Take 81 mg by mouth daily.    Marland Kitchen diltiazem (CARDIZEM CD) 240 MG 24 hr capsule Take 240 mg by mouth daily.    Marland Kitchen donepezil (ARICEPT) 10 MG tablet Take 1 tablet (10 mg total) by mouth at bedtime. 90 tablet 2  . fenofibrate (TRICOR) 145 MG  tablet Take 145 mg by mouth daily.    . Ferrous Fumarate (HIGH POTENCY IRON) 86 (27 FE) MG CAPS Take 27 mg by mouth daily.    . metoprolol tartrate (LOPRESSOR) 25 MG tablet Take 25 mg by mouth 2 (two) times daily.    . Multiple Vitamin (MULTIVITAMIN) tablet Take 1 tablet by mouth daily.      . Omega-3 Fatty Acids (FISH OIL) 1200 MG CAPS Take 1,200 mg by mouth 2 (two) times daily.    . pantoprazole (PROTONIX) 40 MG tablet Take 40 mg by mouth daily at 12 noon.    Marland Kitchen PARoxetine (PAXIL) 20 MG tablet Take 20 mg by mouth daily.     . rosuvastatin (CRESTOR) 20 MG tablet Take 20 mg by mouth at bedtime.    Marland Kitchen albuterol (PROVENTIL HFA;VENTOLIN HFA) 108 (90 BASE) MCG/ACT inhaler Inhale 2 puffs into the lungs every 6 (six) hours as needed for wheezing or shortness of breath. Reported on 06/18/2015     No current facility-administered medications on file prior to visit.    Allergies  Allergen Reactions  . Fentanyl Other (See Comments)    Delirium  . Hydromorphone Hcl Other (See Comments)    Delirium  . Lorazepam Other (See Comments)    Delirium  . Oxycodone-Acetaminophen Other (See Comments)    Delirium  . Lisinopril   . Vicodin [Hydrocodone-Acetaminophen]     Family History  Problem Relation Age of Onset  . Dementia Mother   . COPD Father   . Anesthesia problems Neg Hx   . Dementia Sister   . Cancer Brother   . Cancer Brother   . Dementia Brother   . Diabetes Neg Hx     BP 108/64 mmHg  Pulse 62  Temp(Src) 97.5 F (36.4 C) (Oral)  Resp 12  Wt 243 lb (110.224 kg)  SpO2 98%  Review of Systems denies blurry vision, headache, chest pain, sob, n/v, muscle cramps, excessive diaphoresis, cold intolerance, and easy bruising.   He has emotional lability and urinary frequency.  He has lost a few lbs.  Wife says his memory is poor.  He has rhinorrhea.     Objective:   Physical Exam VS: see vs page GEN: no distress.  In wheelchair. HEAD: head: no deformity eyes:  no periorbital swelling,  no proptosis external nose and ears are normal mouth: no lesion seen NECK: supple, thyroid is not enlarged CHEST WALL: no deformity LUNGS: clear to auscultation BREASTS:  No gynecomastia CV: reg rate and rhythm, no murmur ABD: abdomen is soft, nontender.  no hepatosplenomegaly.  not distended.  Old healed surgical scar.  Ileostomy is present. MUSCULOSKELETAL: muscle bulk and strength are grossly normal.  no obvious joint swelling.   EXTEMITIES: no deformity.  no ulcer on the feet.  feet are of normal color and temp.  no edema.  There is bilateral onychomycosis of the toenails.   PULSES: dorsalis pedis intact bilat.  no carotid bruit NEURO:  cn 2-12 grossly intact.   readily moves all 4's.  sensation is intact to touch on the feet.  Large eczematous rash on the left leg SKIN:  Normal texture and temperature.  No rash or suspicious lesion is visible.   NODES:  None palpable at the neck PSYCH: alert, well-oriented.  Does not appear anxious nor depressed.   outside test results are reviewed: A1c=12.1%  I have reviewed outside records, and summarized: Pt was noted to have severely elevated a1c, and referred here.  i personally reviewed electrocardiogram tracing (10/20/11): Indication: preop Impression: normal    Assessment & Plan:  DM: severe exacerbation.   Noncompliance with insulin, new to me.  We discussed risks.  The best option now is a simple inexpensive regimen.     Patient is advised the following: Patient Instructions  good diet and exercise significantly improve the control of your diabetes.  please let me know if you wish to be referred to a dietician.  high blood sugar is very risky to your health.  you should see an eye doctor and dentist every year.  It is very important to get all recommended vaccinations.  controlling your blood pressure and cholesterol drastically reduces the damage diabetes does to your body.  Those who smoke should quit.  please discuss these with  your doctor.  check your blood sugar twice a day.  vary the time of day when you check, between before the 3 meals, and at bedtime.  also check if you have symptoms of your blood sugar being too high or too low.  please keep a record of the readings and bring it to your next appointment here (or you can bring the meter itself).  You can write it on any piece of paper.  please call us sooner if your blood sugar goes below 70, or if you have a lot of readings over 200. For now, please change both insulins to NPH, 70 units each morning, and none in the evening.   Please call next week, to tell us how the blood sugar is doing.  Please come back for a follow-up appointment in 1 month.

## 2015-06-19 DIAGNOSIS — E119 Type 2 diabetes mellitus without complications: Secondary | ICD-10-CM | POA: Insufficient documentation

## 2015-06-24 ENCOUNTER — Other Ambulatory Visit: Payer: Self-pay

## 2015-06-24 MED ORDER — INSULIN NPH (HUMAN) (ISOPHANE) 100 UNIT/ML ~~LOC~~ SUSP: 70.0000 [IU] | Freq: Every day | SUBCUTANEOUS | Status: DC

## 2015-07-01 DIAGNOSIS — N184 Chronic kidney disease, stage 4 (severe): Secondary | ICD-10-CM | POA: Diagnosis not present

## 2015-07-01 DIAGNOSIS — E782 Mixed hyperlipidemia: Secondary | ICD-10-CM | POA: Diagnosis not present

## 2015-07-01 DIAGNOSIS — E1129 Type 2 diabetes mellitus with other diabetic kidney complication: Secondary | ICD-10-CM | POA: Diagnosis not present

## 2015-07-01 DIAGNOSIS — I10 Essential (primary) hypertension: Secondary | ICD-10-CM | POA: Diagnosis not present

## 2015-07-01 DIAGNOSIS — E669 Obesity, unspecified: Secondary | ICD-10-CM | POA: Diagnosis not present

## 2015-07-09 DIAGNOSIS — Z932 Ileostomy status: Secondary | ICD-10-CM | POA: Diagnosis not present

## 2015-07-09 DIAGNOSIS — E119 Type 2 diabetes mellitus without complications: Secondary | ICD-10-CM | POA: Diagnosis not present

## 2015-07-10 ENCOUNTER — Telehealth: Payer: Self-pay | Admitting: Endocrinology

## 2015-07-10 NOTE — Telephone Encounter (Signed)
Wife is doing better with the insulin

## 2015-07-11 DIAGNOSIS — E875 Hyperkalemia: Secondary | ICD-10-CM | POA: Diagnosis not present

## 2015-07-11 NOTE — Telephone Encounter (Signed)
I spoke with [name] and she stated this message was to inform the pt is doing better with the insulin injections and his blood sugar has been within the normal range.

## 2015-07-22 ENCOUNTER — Ambulatory Visit (INDEPENDENT_AMBULATORY_CARE_PROVIDER_SITE_OTHER): Payer: Medicare Other | Admitting: Endocrinology

## 2015-07-22 ENCOUNTER — Encounter: Payer: Self-pay | Admitting: Endocrinology

## 2015-07-22 VITALS — BP 126/80 | HR 65 | Temp 98.7°F | Ht 72.0 in | Wt 245.0 lb

## 2015-07-22 DIAGNOSIS — Z794 Long term (current) use of insulin: Secondary | ICD-10-CM

## 2015-07-22 DIAGNOSIS — N184 Chronic kidney disease, stage 4 (severe): Secondary | ICD-10-CM

## 2015-07-22 DIAGNOSIS — E1122 Type 2 diabetes mellitus with diabetic chronic kidney disease: Secondary | ICD-10-CM | POA: Diagnosis not present

## 2015-07-22 LAB — POCT GLYCOSYLATED HEMOGLOBIN (HGB A1C): Hemoglobin A1C: 7.7

## 2015-07-22 NOTE — Patient Instructions (Addendum)
check your blood sugar twice a day.  vary the time of day when you check, between before the 3 meals, and at bedtime.  also check if you have symptoms of your blood sugar being too high or too low.  please keep a record of the readings and bring it to your next appointment here (or you can bring the meter itself).  You can write it on any piece of paper.  please call us sooner if your blood sugar goes below 70, or if you have a lot of readings over 200.  Please continue the same insulin Please come back for a follow-up appointment in 3 months.    

## 2015-07-22 NOTE — Progress Notes (Signed)
Subjective:    Patient ID: [name], male    DOB: 1941/02/06, 75 y.o.   MRN: [id number]  HPI  Pt returns for f/u of diabetes mellitus: DM type: Insulin-requiring type 2.  Dx'ed: [id number] Complications: polyneuropathy, CAD, and renal failure.   Therapy: insulin since 2011 GDM: never DKA: never Severe hypoglycemia: never Pancreatitis: never Other: he takes QD insulin, due to h/o noncompliance; he takes human insulin, due to cost Interval history: he brings his meter with his cbg's which i have reviewed today.  It varies from 140-200's.  It is in general higher as the day goes on.  pt states he feels well in general.  He says he never misses the insulin.   Past Medical History  Diagnosis Date  . Gout   . Depression   . Diabetes mellitus   . Unspecified essential hypertension   . Hypercholesteremia     IIA  . Colitis, ulcerative ([place])     NOS  . Personal hx-rectal/anal malignancy   . Urinary incontinence   . Benign prostatic hypertrophy     S/p TURP   . H/O ETOH abuse 06/06/2011  . Uncontrolled secondary diabetes mellitus with stage 3 CKD (GFR 30-59) ([place]) 06/05/2011  . Pleurisy   . AAA (abdominal aortic aneurysm) ([employer]) 1992    S/p repair and grafting  . History of alcohol abuse   . CAD (coronary artery disease)   . Stroke [facility])     75 yrs old  . Stones in the urinary tract   . GERD (gastroesophageal reflux disease)   . Normocytic anemia     Chronic  . PAF (paroxysmal atrial fibrillation) ([place])     during hospitalization 06/2011 for sepsis  . Cardiomyopathy, ischemic     EF 40-45% by echo 06/2011  . Bladder cancer ([place]) 2006  . 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 Colon cancer ([place])   . Chronic bronchitis ([place])     "used to get it quite often; found out medicine was causing it"  . Arthritis     "left knee"  . Memory disorder 04/23/2013  . Obesity   . Dementia     Past Surgical History  Procedure Laterality Date  . Transurethral resection of prostate      . Abdominal aortic aneurysm repair  1992    "had 3 blockages; 3 aneurysms repaired at the same time"  . I&d knee with poly exchange  06/12/2011    Procedure: IRRIGATION AND DEBRIDEMENT KNEE WITH POLY EXCHANGE;  Surgeon: Yvette Rack., MD;  [facility];  Service: Orthopedics;  Laterality: Left;  . Appendectomy    . Eye surgery    . Coronary angioplasty      1992  . Colonoscopy w/ polypectomy    . Cataract extraction w/ intraocular lens  implant, bilateral  ~ 2010  . Total knee arthroplasty  1990's    left  . Joint replacement      left knee  . Total knee revision  10/26/11    left  . Total knee revision  10/26/2011    Procedure: TOTAL KNEE REVISION;  Surgeon: Garald Balding, MD;  Location: [place];  Service: Orthopedics;  Laterality: Left;  left total knee revision  . Angioplasty    . Bladder tumor excision  2002  . Compound fracture ankle  08/17/2003    Social History   Social History  . Marital Status: Married    Spouse Name: N/A  .  Number of Children: 3  . Years of Education: hs   Occupational History  . Retired    Social History Main Topics  . Smoking status: Former Smoker -- 2.00 packs/day for 12 years    Types: Cigarettes    Quit date: 06/04/1970  . Smokeless tobacco: Current User    Types: Chew  . Alcohol Use: No     Comment: Occasional  . Drug Use: No  . Sexual Activity: Not Currently    Birth Control/ Protection: None   Other Topics Concern  . Not on file   Social History Narrative   Married. Patient lives at home with his wife.   Patient is right handed.   Patient drinks 3 cups of caffeine per day.    Current Outpatient Prescriptions on File Prior to Visit  Medication Sig Dispense Refill  . albuterol (PROVENTIL HFA;VENTOLIN HFA) 108 (90 BASE) MCG/ACT inhaler Inhale 2 puffs into the lungs every 6 (six) hours as needed for wheezing or shortness of breath. Reported on 06/18/2015    . aspirin EC 81 MG tablet Take 81 mg by mouth daily.    Marland Kitchen  diltiazem (CARDIZEM CD) 240 MG 24 hr capsule Take 240 mg by mouth daily.    Marland Kitchen donepezil (ARICEPT) 10 MG tablet Take 1 tablet (10 mg total) by mouth at bedtime. 90 tablet 2  . fenofibrate (TRICOR) 145 MG tablet Take 145 mg by mouth daily.    . Ferrous Fumarate (HIGH POTENCY IRON) 86 (27 FE) MG CAPS Take 27 mg by mouth daily.    . insulin NPH Human (HUMULIN N) 100 UNIT/ML injection Inject 0.7 mLs (70 Units total) into the skin daily before breakfast. 30 mL 4  . metoprolol tartrate (LOPRESSOR) 25 MG tablet Take 25 mg by mouth 2 (two) times daily.    . Multiple Vitamin (MULTIVITAMIN) tablet Take 1 tablet by mouth daily.      . Omega-3 Fatty Acids (FISH OIL) 1200 MG CAPS Take 1,200 mg by mouth 2 (two) times daily.    . pantoprazole (PROTONIX) 40 MG tablet Take 40 mg by mouth daily at 12 noon.    Marland Kitchen PARoxetine (PAXIL) 20 MG tablet Take 40 mg by mouth daily.     . rosuvastatin (CRESTOR) 20 MG tablet Take 20 mg by mouth at bedtime.     No current facility-administered medications on file prior to visit.    Allergies  Allergen Reactions  . Fentanyl Other (See Comments)    Delirium  . Hydromorphone Hcl Other (See Comments)    Delirium  . Lorazepam Other (See Comments)    Delirium  . Oxycodone-Acetaminophen Other (See Comments)    Delirium  . Lisinopril   . Vicodin [Hydrocodone-Acetaminophen]     Family History  Problem Relation Age of Onset  . Dementia Mother   . COPD Father   . Anesthesia problems Neg Hx   . Dementia Sister   . Cancer Brother   . Cancer Brother   . Dementia Brother   . Diabetes Neg Hx     BP 126/80 mmHg  Pulse 65  Temp(Src) 98.7 F (37.1 C) (Oral)  Ht 6' (1.829 m)  Wt 245 lb (111.131 kg)  BMI 33.22 kg/m2  SpO2 95%  Review of Systems He denies hypoglycemia.      Objective:   Physical Exam VITAL SIGNS:  See vs page GENERAL: no distress.  In wheelchair SKIN:  Insulin injection sites at the anterior abdomen are normal.     Lab Results  Component Value  Date   HGBA1C 7.7 07/22/2015      Assessment & Plan:  DM: this is the best control this pt should aim for, given this regimen, which does match insulin to his changing needs throughout the day.   Patient is advised the following: Patient Instructions  check your blood sugar twice a day.  vary the time of day when you check, between before the 3 meals, and at bedtime.  also check if you have symptoms of your blood sugar being too high or too low.  please keep a record of the readings and bring it to your next appointment here (or you can bring the meter itself).  You can write it on any piece of paper.  please call us sooner if your blood sugar goes below 70, or if you have a lot of readings over 200. Please continue the same insulin.   Please come back for a follow-up appointment in 3 months.

## 2015-08-06 DIAGNOSIS — Z932 Ileostomy status: Secondary | ICD-10-CM | POA: Diagnosis not present

## 2015-08-12 DIAGNOSIS — E119 Type 2 diabetes mellitus without complications: Secondary | ICD-10-CM | POA: Diagnosis not present

## 2015-08-12 DIAGNOSIS — Z932 Ileostomy status: Secondary | ICD-10-CM | POA: Diagnosis not present

## 2015-08-13 DIAGNOSIS — L299 Pruritus, unspecified: Secondary | ICD-10-CM | POA: Diagnosis not present

## 2015-08-13 DIAGNOSIS — L03319 Cellulitis of trunk, unspecified: Secondary | ICD-10-CM | POA: Diagnosis not present

## 2015-08-21 ENCOUNTER — Ambulatory Visit: Payer: Medicare Other | Admitting: Surgery

## 2015-08-26 ENCOUNTER — Encounter: Payer: Medicare Other | Attending: Internal Medicine | Admitting: Internal Medicine

## 2015-08-26 DIAGNOSIS — I2510 Atherosclerotic heart disease of native coronary artery without angina pectoris: Secondary | ICD-10-CM | POA: Insufficient documentation

## 2015-08-26 DIAGNOSIS — F0390 Unspecified dementia without behavioral disturbance: Secondary | ICD-10-CM | POA: Insufficient documentation

## 2015-08-26 DIAGNOSIS — Z8546 Personal history of malignant neoplasm of prostate: Secondary | ICD-10-CM | POA: Insufficient documentation

## 2015-08-26 DIAGNOSIS — E119 Type 2 diabetes mellitus without complications: Secondary | ICD-10-CM | POA: Insufficient documentation

## 2015-08-26 DIAGNOSIS — Z432 Encounter for attention to ileostomy: Secondary | ICD-10-CM | POA: Diagnosis not present

## 2015-08-26 DIAGNOSIS — J449 Chronic obstructive pulmonary disease, unspecified: Secondary | ICD-10-CM | POA: Diagnosis not present

## 2015-08-26 DIAGNOSIS — Z932 Ileostomy status: Secondary | ICD-10-CM | POA: Diagnosis not present

## 2015-08-26 DIAGNOSIS — B354 Tinea corporis: Secondary | ICD-10-CM | POA: Diagnosis not present

## 2015-08-28 ENCOUNTER — Encounter: Payer: Medicare Other | Admitting: Surgery

## 2015-08-28 DIAGNOSIS — I2510 Atherosclerotic heart disease of native coronary artery without angina pectoris: Secondary | ICD-10-CM | POA: Diagnosis not present

## 2015-08-28 DIAGNOSIS — Z432 Encounter for attention to ileostomy: Secondary | ICD-10-CM | POA: Diagnosis not present

## 2015-08-28 DIAGNOSIS — E119 Type 2 diabetes mellitus without complications: Secondary | ICD-10-CM | POA: Diagnosis not present

## 2015-08-28 DIAGNOSIS — B354 Tinea corporis: Secondary | ICD-10-CM | POA: Diagnosis not present

## 2015-08-28 DIAGNOSIS — R21 Rash and other nonspecific skin eruption: Secondary | ICD-10-CM | POA: Diagnosis not present

## 2015-08-28 DIAGNOSIS — J449 Chronic obstructive pulmonary disease, unspecified: Secondary | ICD-10-CM | POA: Diagnosis not present

## 2015-08-28 NOTE — Progress Notes (Addendum)
[name], [name] ([id number]) Visit Report for 08/26/2015 Abuse/Suicide Risk Screen Details Patient Name: [name], [name]. Date of Service: 08/26/2015 8:00 AM Medical Record Number: [id number] Patient Account Number: [id number] Date of Birth/Sex: 1940/12/05 (75 y.o. Male) Treating RN: Cornell Barman Primary Care Physician: Daiva Eves Other Clinician: Referring Physician: Daiva Eves Treating Physician/Extender: Ricard Dillon Weeks in Treatment: 0 Abuse/Suicide Risk Screen Items Answer ABUSE/SUICIDE RISK SCREEN: Has anyone close to you tried to hurt or harm you recentlyo No Do you feel uncomfortable with anyone in your familyo No Has anyone forced you do things that you didnot want to doo No Do you have any thoughts of harming yourselfo No Electronic Signature(s) Signed: 08/28/2015 8:27:53 AM By: Gretta Cool, RN, BSN, Kim RN, BSN Previous Signature: 08/27/2015 5:05:53 PM Version By: Gretta Cool, RN, BSN, Kim RN, BSN Entered By: Gretta Cool, RN, BSN, Kim on 08/28/2015 08:27:53 [name] ([id number]) -------------------------------------------------------------------------------- Activities of Daily Living Details Patient Name: [name], [name]. Date of Service: 08/26/2015 8:00 AM Medical Record Number: [id number] Patient Account Number: [id number] Date of Birth/Sex: 1941-03-09 (75 y.o. Male) Treating RN: Cornell Barman Primary Care Physician: Daiva Eves Other Clinician: Referring Physician: Daiva Eves Treating Physician/Extender: Ricard Dillon Weeks in Treatment: 0 Activities of Daily Living Items Answer Activities of Daily Living (Please select one for each item) Drive Automobile Need Assistance Take Medications Need Assistance Use Telephone Need Assistance Care for Appearance Need Assistance Use Toilet Need Assistance Bath / Shower Need Assistance Dress Self Need Assistance Feed Self Need Assistance Walk Need Assistance Get In / Out Bed Need Assistance Housework Need  Assistance Prepare Meals Need Assistance Handle Money Need Assistance Shop for Self Need Assistance Electronic Signature(s) Signed: 08/28/2015 8:28:00 AM By: Gretta Cool, RN, BSN, Kim RN, BSN Previous Signature: 08/27/2015 5:05:53 PM Version By: Gretta Cool, RN, BSN, Kim RN, BSN Entered By: Gretta Cool, RN, BSN, Kim on 08/28/2015 08:28:00 [name], [name] ([id number]) -------------------------------------------------------------------------------- Education Assessment Details Patient Name: [name], [name]. Date of Service: 08/26/2015 8:00 AM Medical Record Number: [id number] Patient Account Number: [id number] Date of Birth/Sex: [birth year] (75 y.o. Male) Treating RN: Cornell Barman Primary Care Physician: Daiva Eves Other Clinician: Referring Physician: Daiva Eves Treating Physician/Extender: Tito Dine in Treatment: 0 Learning Preferences/Education Level/Primary Language Learning Preference: Explanation Highest Education Level: High School Preferred Language: English Cognitive Barrier Assessment/Beliefs Language Barrier: No Translator Needed: No Memory Deficit: No Emotional Barrier: No Cultural/Religious Beliefs Affecting Medical No Care: Physical Barrier Assessment Impaired Vision: No Impaired Hearing: Yes Knowledge/Comprehension Assessment Knowledge Level: Medium Comprehension Level: Medium Ability to understand written Medium instructions: Ability to understand verbal Medium instructions: Motivation Assessment Anxiety Level: Calm Cooperation: Cooperative Education Importance: Acknowledges Need Interest in Health Problems: Asks Questions Perception: Coherent Willingness to Engage in Self- Medium Management Activities: Readiness to Engage in Self- Medium Management Activities: Electronic Signature(s) Signed: 08/28/2015 8:28:10 AM By: Gretta Cool, RN, BSN, Kim RN, BSN Previous Signature: 08/27/2015 5:05:53 PM Version By: Gretta Cool RN, BSN, Kim RN, BSN [address], [place]  ([id number]) Entered By: Gretta Cool, RN, BSN, Kim on 08/28/2015 08:28:10 [name], [name] ([id number]) -------------------------------------------------------------------------------- Fall Risk Assessment Details Patient Name: [name]. Date of Service: 08/26/2015 8:00 AM Medical Record Number: [id number] Patient Account Number: [id number] Date of Birth/Sex: 1941-04-04 (75 y.o. Male) Treating RN: Cornell Barman Primary Care Physician: Daiva Eves Other Clinician: Referring Physician: Daiva Eves Treating Physician/Extender: Tito Dine in Treatment: 0 Fall Risk Assessment Items Have you had 2 or more falls in the last 12 monthso 0 No Have you had  any fall that resulted in injury in the last 12 monthso 0 No FALL RISK ASSESSMENT: History of falling - immediate or within 3 months 0 No Secondary diagnosis 0 No Ambulatory aid None/bed rest/wheelchair/nurse 0 No Crutches/cane/walker 15 Yes Furniture 0 No IV Access/Saline Lock 0 No Gait/Training Normal/bed rest/immobile 0 No Weak 10 Yes Impaired 0 No Mental Status Oriented to own ability 0 Yes Electronic Signature(s) Signed: 08/28/2015 8:28:18 AM By: Gretta Cool, RN, BSN, Kim RN, BSN Previous Signature: 08/27/2015 5:05:53 PM Version By: Gretta Cool, RN, BSN, Kim RN, BSN Entered By: Gretta Cool, RN, BSN, Kim on 08/28/2015 08:28:18 [name] ([id number]) -------------------------------------------------------------------------------- Foot Assessment Details Patient Name: [name], [name]. Date of Service: 08/26/2015 8:00 AM Medical Record Number: [id number] Patient Account Number: [id number] Date of Birth/Sex: 1941-01-07 (75 y.o. Male) Treating RN: Cornell Barman Primary Care Physician: Daiva Eves Other Clinician: Referring Physician: Daiva Eves Treating Physician/Extender: Ricard Dillon Weeks in Treatment: 0 Foot Assessment Items Site Locations + = Sensation present, - = Sensation absent, C = Callus, U = Ulcer R =  Redness, W = Warmth, M = Maceration, PU = Pre-ulcerative lesion F = Fissure, S = Swelling, D = Dryness Assessment Right: Left: Other Deformity: No No Prior Foot Ulcer: No No Prior Amputation: No No Charcot Joint: No No Ambulatory Status: Gait: Electronic Signature(s) Signed: 08/28/2015 8:28:29 AM By: Gretta Cool, RN, BSN, Kim RN, BSN Entered By: Gretta Cool, RN, BSN, Kim on 08/28/2015 08:28:29 [name] ([id number]) -------------------------------------------------------------------------------- Nutrition Risk Assessment Details Patient Name: [name]. Date of Service: 08/26/2015 8:00 AM Medical Record Number: [id number] Patient Account Number: [id number] Date of Birth/Sex: [birth year] (75 y.o. Male) Treating RN: Cornell Barman Primary Care Physician: Daiva Eves Other Clinician: Referring Physician: Daiva Eves Treating Physician/Extender: Ricard Dillon Weeks in Treatment: 0 Height (in): 73 Weight (lbs): 145 Body Mass Index (BMI): 19.1 Nutrition Risk Assessment Items NUTRITION RISK SCREEN: I have an illness or condition that made me change the kind and/or 0 No amount of food I eat I eat fewer than two meals per day 0 No I eat few fruits and vegetables, or milk products 0 No I have three or more drinks of beer, liquor or wine almost every day 0 No I have tooth or mouth problems that make it hard for me to eat 0 No I don't always have enough money to buy the food I need 0 No I eat alone most of the time 0 No I take three or more different prescribed or over-the-counter drugs a 0 No day Without wanting to, I have lost or gained 10 pounds in the last six 0 No months I am not always physically able to shop, cook and/or feed myself 0 No Nutrition Protocols Good Risk Protocol Provide education on elevated blood sugars Moderate Risk Protocol 0 and impact on wound healing, as applicable Electronic Signature(s) Signed: 08/28/2015 8:28:25 AM By: Gretta Cool RN, BSN, Kim  RN, BSN Previous Signature: 08/27/2015 5:05:53 PM Version By: Gretta Cool, RN, BSN, Kim RN, BSN Entered By: Gretta Cool, RN, BSN, Kim on 08/28/2015 [id number]

## 2015-08-30 NOTE — Progress Notes (Signed)
[name], [name] ([id number]) Visit Report for 08/28/2015 Chief Complaint Document Details Patient Name: [name], [name]. Date of Service: 08/28/2015 3:15 PM Medical Record Number: [id number] Patient Account Number: [id number] Date of Birth/Sex: 27-Jan-1941 (75 y.o. Male) Treating RN: Ahmed Prima Primary Care Physician: Daiva Eves Other Clinician: Referring Physician: Daiva Eves Treating Physician/Extender: Frann Rider in Treatment: 0 Information Obtained from: Patient Chief Complaint Patient is here for consultation with regards to cellulitis had an ostomy site referred by his primary care at [facility] family practice in [place] Signature(s) Signed: 08/28/2015 3:56:11 PM By: Christin Fudge MD, FACS Entered By: Christin Fudge on 08/28/2015 15:56:11 [name] ([id number]) -------------------------------------------------------------------------------- HPI Details Patient Name: [name] Date of Service: 08/28/2015 3:15 PM Medical Record Number: [id number] Patient Account Number: [id number] Date of Birth/Sex: 08-14-1940 (75 y.o. Male) Treating RN: Ahmed Prima Primary Care Physician: Daiva Eves Other Clinician: Referring Physician: Daiva Eves Treating Physician/Extender: Frann Rider in Treatment: 0 History of Present Illness HPI Description: 08/26/15; this is a patient is here for review of problems with his ostomy site that. Is referred by Dr. Lisbeth Ply at [facility] family practice in [place]. Dr. Lisbeth Ply saw the patient last week prescribed Keflex and Diflucan for a combination of cellulitis question bacterial question fungal. The patient is here for our review. They have already consulted with the ostomy nurse [facility] in [place] and she is actually change the actual ostomy material. [facility] through [facility] the patient had a total colectomy with an ileostomy raised in  2007. Surgery done by Dr. Kathrin Penner who is a retired Psychologist, sport and exercise in [place]. Over the years things have been quite stable, the bag needs to be changed several times a day as this is an ileostomy. Generally the ostomy site itself needs to be changed every third day or so. Over the last month there is been increasing difficulties with inflammation of the skin under the adhesive. Weeping edema fluid from the irritated skin is made adhesive of the ostomy material more and more difficult and they're having to change this to her 3 times a day. Of course when they're changing at there is constant ostomy leakage is this is an ileostomy. They have been using antifungal powder with some improvement the. The patient has a history of diabetes, COPD, coronary artery disease prostate cancer and dementia. The dementia has required his wife did take over the management of this problem which I think is a source of frustration to the patient. He does not have a history of real pain, fever or abdominal pain. The frequency of the drainage does not seem to be any different. 08/28/2015 -- the patient's wife and daughter called saying that none of the remedies suggested had helped and the patient had to have his bag change 9 times last night and they were rather distraught. He continues to have a lot of peri-ileostomy erythema and pain Electronic Signature(s) Signed: 08/28/2015 3:56:54 PM By: Christin Fudge MD, FACS Entered By: Christin Fudge on 08/28/2015 15:56:54 [name] ([id number]) -------------------------------------------------------------------------------- Physical Exam Details Patient Name: [name] Date of Service: 08/28/2015 3:15 PM Medical Record Number: [id number] Patient Account Number: [id number] Date of Birth/Sex: Feb 09, 1941 (75 y.o. Male) Treating RN: Ahmed Prima Primary Care Physician: Daiva Eves Other Clinician: Referring Physician: Daiva Eves Treating  Physician/Extender: Frann Rider in Treatment: 0 Constitutional . Pulse regular. Respirations normal and unlabored. Afebrile. . Eyes Nonicteric. Reactive to light. Ears, Nose, Mouth, and Throat Lips, teeth,  and gums WNL.Marland Kitchen Moist mucosa without lesions. Neck supple and nontender. No palpable supraclavicular or cervical adenopathy. Normal sized without goiter. Respiratory WNL. No retractions.. Breath sounds WNL, No rubs, rales, rhonchi, or wheeze.. Cardiovascular Heart rhythm and rate regular, no murmur or gallop.. Pedal Pulses WNL. No clubbing, cyanosis or edema. Lymphatic No adneopathy. No adenopathy. No adenopathy. Musculoskeletal Adexa without tenderness or enlargement.. Digits and nails w/o clubbing, cyanosis, infection, petechiae, ischemia, or inflammatory conditions.. Integumentary (Hair, Skin) No suspicious lesions. No crepitus or fluctuance. No peri-wound warmth or erythema. No masses.Marland Kitchen Psychiatric Judgement and insight Intact.. No evidence of depression, anxiety, or agitation.. Notes the ileostomy in the right lower quadrant, and the periumbilical area is protruding appropriately and seems to be fine. The surrounding skin is shiny and erythematous possibly from a resolving fungal infection. There is not much excoriation or tenderness around it at the present time. Electronic Signature(s) Signed: 08/28/2015 3:58:10 PM By: Christin Fudge MD, FACS Entered By: Christin Fudge on 08/28/2015 15:58:10 [name] ([id number]) -------------------------------------------------------------------------------- Physician Orders Details Patient Name: [name], [name]. Date of Service: 08/28/2015 3:15 PM Medical Record Number: [id number] Patient Account Number: [id number] Date of Birth/Sex: [birth year] (75 y.o. Male) Treating RN: Ahmed Prima Primary Care Physician: Daiva Eves Other Clinician: Referring Physician: Daiva Eves Treating Physician/Extender: Frann Rider in Treatment: 0 Verbal / Phone Orders: Yes Clinician: Pinkerton, Debi Read Back and Verified: Yes Diagnosis Coding Wound Cleansing o Cleanse wound with mild soap and water Skin Barriers/Peri-Wound Care o Skin Prep o Antifungal powder o Barrier cream Dressing Change Frequency o Other: - as needed Follow-up Appointments o Return Appointment in 1 week. Medications-please add to medication list. o Other: - lotrisone cream over the counter o Other: - Engineer, maintenance (IT)) Signed: 08/28/2015 4:02:17 PM By: Christin Fudge MD, FACS Signed: 08/29/2015 4:37:51 PM By: Alric Quan Entered By: Alric Quan on 08/28/2015 15:49:20 [name] ([id number]) -------------------------------------------------------------------------------- Problem List Details Patient Name: [name], [name]. Date of Service: 08/28/2015 3:15 PM Medical Record Number: [id number] Patient Account Number: [id number] Date of Birth/Sex: [birth year] (75 y.o. Male) Treating RN: Ahmed Prima Primary Care Physician: Daiva Eves Other Clinician: Referring Physician: Daiva Eves Treating Physician/Extender: Frann Rider in Treatment: 0 Active Problems ICD-10 Encounter Code Description Active Date Diagnosis Z43.2 Encounter for attention to ileostomy 08/26/2015 Yes B35.4 Tinea corporis 08/26/2015 Yes Inactive Problems Resolved Problems Electronic Signature(s) Signed: 08/28/2015 3:56:05 PM By: Christin Fudge MD, FACS Entered By: Christin Fudge on 08/28/2015 15:56:04 [name] ([id number]) -------------------------------------------------------------------------------- Progress Note Details Patient Name: [name]. Date of Service: 08/28/2015 3:15 PM Medical Record Number: [id number] Patient Account Number: [id number] Date of Birth/Sex: [birth year] (75 y.o. Male) Treating RN: Ahmed Prima Primary Care Physician: Daiva Eves Other  Clinician: Referring Physician: Daiva Eves Treating Physician/Extender: Frann Rider in Treatment: 0 Subjective Chief Complaint Information obtained from Patient Patient is here for consultation with regards to cellulitis had an ostomy site referred by his primary care at [facility] family practice in [place] History of Present Illness (HPI) 08/26/15; this is a patient is here for review of problems with his ostomy site that. Is referred by Dr. Lisbeth Ply at [facility] family practice in [place]. Dr. Lisbeth Ply saw the patient last week prescribed Keflex and Diflucan for a combination of cellulitis question bacterial question fungal. The patient is here for our review. They have already consulted with the ostomy nurse [facility] in [place] and she is actually change the actual ostomy material. Looking  through [facility] the patient had a total colectomy with an ileostomy raised in 2007. Surgery done by Dr. Kathrin Penner who is a retired Psychologist, sport and exercise in [place]. Over the years things have been quite stable, the bag needs to be changed several times a day as this is an ileostomy. Generally the ostomy site itself needs to be changed every third day or so. Over the last month there is been increasing difficulties with inflammation of the skin under the adhesive. Weeping edema fluid from the irritated skin is made adhesive of the ostomy material more and more difficult and they're having to change this to her 3 times a day. Of course when they're changing at there is constant ostomy leakage is this is an ileostomy. They have been using antifungal powder with some improvement the. The patient has a history of diabetes, COPD, coronary artery disease prostate cancer and dementia. The dementia has required his wife did take over the management of this problem which I think is a source of frustration to the patient. He does not have a history of real  pain, fever or abdominal pain. The frequency of the drainage does not seem to be any different. 08/28/2015 -- the patient's wife and daughter called saying that none of the remedies suggested had helped and the patient had to have his bag change 9 times last night and they were rather distraught. He continues to have a lot of peri-ileostomy erythema and pain Objective Constitutional [name], Logan K. ([id number]) Pulse regular. Respirations normal and unlabored. Afebrile. Vitals Time Taken: 3:14 PM, Height: 73 in, Weight: 145 lbs, BMI: 19.1, Temperature: 97.8 F, Pulse: 62 bpm, Respiratory Rate: 18 breaths/min, Blood Pressure: 143/62 mmHg. Eyes Nonicteric. Reactive to light. Ears, Nose, Mouth, and Throat Lips, teeth, and gums WNL.Marland Kitchen Moist mucosa without lesions. Neck supple and nontender. No palpable supraclavicular or cervical adenopathy. Normal sized without goiter. Respiratory WNL. No retractions.. Breath sounds WNL, No rubs, rales, rhonchi, or wheeze.. Cardiovascular Heart rhythm and rate regular, no murmur or gallop.. Pedal Pulses WNL. No clubbing, cyanosis or edema. Lymphatic No adneopathy. No adenopathy. No adenopathy. Musculoskeletal Adexa without tenderness or enlargement.. Digits and nails w/o clubbing, cyanosis, infection, petechiae, ischemia, or inflammatory conditions.Marland Kitchen Psychiatric Judgement and insight Intact.. No evidence of depression, anxiety, or agitation.. General Notes: the ileostomy in the right lower quadrant, and the periumbilical area is protruding appropriately and seems to be fine. The surrounding skin is shiny and erythematous possibly from a resolving fungal infection. There is not much excoriation or tenderness around it at the present time. Integumentary (Hair, Skin) No suspicious lesions. No crepitus or fluctuance. No peri-wound warmth or erythema. No masses.. Other Condition(s) Patient presents with Rash / Dermatitis located on the stomach around  ostomy site. The skin appearance exhibited: Excoriation, Moist, Rash. The skin appearance did not exhibit: Atrophie Blanche, Callus, Crepitus, Cyanosis, Dry/Scaly, Ecchymosis, Erythema, Fluctuance, Friable, Hemosiderin Staining, Induration, Localized Edema, Maceration, Mottled, Pallor, Rubor, Scarring. Skin temperature was noted as No Abnormality. There is tenderness on palpation. [name], [name] ([id number]) Assessment Active Problems ICD-10 Z43.2 - Encounter for attention to ileostomy B35.4 - Tinea corporis after discussing thoroughly with the patient and his daughter who is an Therapist, sports at the bedside I have recommended; 1. Application of zinc oxide paste right around the stoma, and then using a larger appliance away from the stoma edge could hopefully stick better. 2. Not use nystatin paste or any thing to prevent the bag sticking to the skin 3. the essence of the  problem, at the present time seems to be keeping the appliance adhere to the skin. 4. Continuing to see the wound care nurse for various publications and combinations that may work. 5. The crux of the problem would be to heal the surrounding fungal inflammation and once the skin is back to normal getting an appropriate appliance to stick. We would be happy to see the patient back for open wounds or excoriation of the skin around it. Plan Wound Cleansing: Cleanse wound with mild soap and water Skin Barriers/Peri-Wound Care: Skin Prep Antifungal powder Barrier cream Dressing Change Frequency: Other: - as needed Follow-up Appointments: Return Appointment in 1 week. Medications-please add to medication list.: Other: - lotrisone cream over the counter Other: - [place] ([id number]) after discussing thoroughly with the patient and his daughter who is an Therapist, sports at the bedside I have recommended; 1. Application of zinc oxide paste right around the stoma, and then using a larger appliance away from the stoma  edge could hopefully stick better. 2. Not use nystatin paste or any thing to prevent the bag sticking to the skin 3. the essence of the problem, at the present time seems to be keeping the appliance adhere to the skin. 4. Continuing to see the wound care nurse for various publications and combinations that may work. 5. The crux of the problem would be to heal the surrounding fungal inflammation and once the skin is back to normal getting an appropriate appliance to stick. We would be happy to see the patient back for open wounds or excoriation of the skin around it. Electronic Signature(s) Signed: 08/29/2015 4:03:48 PM By: Christin Fudge MD, FACS Previous Signature: 08/28/2015 4:01:45 PM Version By: Christin Fudge MD, FACS Entered By: Christin Fudge on 08/29/2015 16:03:47 [name] ([id number]) -------------------------------------------------------------------------------- SuperBill Details Patient Name: [name] Date of Service: 08/28/2015 Medical Record Number: [id number] Patient Account Number: [id number] Date of Birth/Sex: May 07, 1940 (75 y.o. Male) Treating RN: Ahmed Prima Primary Care Physician: Daiva Eves Other Clinician: Referring Physician: Daiva Eves Treating Physician/Extender: Frann Rider in Treatment: 0 Diagnosis Coding ICD-10 Codes Code Description [phone] Encounter for attention to ileostomy B35.4 Tinea corporis Facility Procedures CPT4 Code: [id number] Description: [facility] VISIT-LEV 3 EST PT Modifier: Quantity: 1 Physician Procedures CPT4 Code: [id number] Description: [id number] - WC PHYS LEVEL 3 - EST PT ICD-10 Description Diagnosis Z43.2 Encounter for attention to ileostomy B35.4 Tinea corporis Modifier: Quantity: 1 Electronic Signature(s) Signed: 08/29/2015 4:01:52 PM By: Christin Fudge MD, FACS Signed: 08/29/2015 4:37:51 PM By: Alric Quan Previous Signature: 08/28/2015 4:01:57 PM Version By: Christin Fudge MD, FACS Entered  By: Alric Quan on 08/28/2015 [id number]

## 2015-08-30 NOTE — Progress Notes (Signed)
[name], [name] ([id number]) Visit Report for 08/28/2015 Arrival Information Details Patient Name: [name], [name]. Date of Service: 08/28/2015 3:15 PM Medical Record Number: [id number] Patient Account Number: [id number] Date of Birth/Sex: [birth year] (75 y.o. Male) Treating RN: Ahmed Prima Primary Care Physician: Daiva Eves Other Clinician: Referring Physician: Daiva Eves Treating Physician/Extender: Frann Rider in Treatment: 0 Visit Information History Since Last Visit All ordered tests and consults were completed: No Patient Arrived: Wheel Chair Added or deleted any medications: No Arrival Time: 15:13 Any new allergies or adverse reactions: No Accompanied By: wife and daughter Had a fall or experienced change in No activities of daily living that may affect Transfer Assistance: EasyPivot risk of falls: Patient Lift Signs or symptoms of abuse/neglect since last No Patient Identification Verified: Yes visito Secondary Verification Process Yes Hospitalized since last visit: No Completed: Pain Present Now: Yes Patient Requires Transmission- No Based Precautions: Patient Has Alerts: Yes Patient Alerts: DM II Electronic Signature(s) Signed: 08/29/2015 4:37:51 PM By: Alric Quan Entered By: Alric Quan on 08/28/2015 15:14:22 [name] ([id number]) -------------------------------------------------------------------------------- Clinic Level of Care Assessment Details Patient Name: [name]. Date of Service: 08/28/2015 3:15 PM Medical Record Number: [id number] Patient Account Number: [id number] Date of Birth/Sex: [birth year] (75 y.o. Male) Treating RN: Ahmed Prima Primary Care Physician: Daiva Eves Other Clinician: Referring Physician: Daiva Eves Treating Physician/Extender: Frann Rider in Treatment: 0 Clinic Level of Care Assessment Items TOOL 4 Quantity Score X - Use when only an EandM is performed on  FOLLOW-UP visit 1 0 ASSESSMENTS - Nursing Assessment / Reassessment X - Reassessment of Co-morbidities (includes updates in patient status) 1 10 X - Reassessment of Adherence to Treatment Plan 1 5 ASSESSMENTS - Wound and Skin Assessment / Reassessment []  - Simple Wound Assessment / Reassessment - one wound 0 []  - Complex Wound Assessment / Reassessment - multiple wounds 0 X - Dermatologic / Skin Assessment (not related to wound area) 1 10 ASSESSMENTS - Focused Assessment []  - Circumferential Edema Measurements - multi extremities 0 []  - Nutritional Assessment / Counseling / Intervention 0 []  - Lower Extremity Assessment (monofilament, tuning fork, pulses) 0 []  - Peripheral Arterial Disease Assessment (using hand held doppler) 0 ASSESSMENTS - Ostomy and/or Continence Assessment and Care []  - Incontinence Assessment and Management 0 X - Ostomy Care Assessment and Management (repouching, etc.) 1 20 PROCESS - Coordination of Care []  - Simple Patient / Family Education for ongoing care 0 X - Complex (extensive) Patient / Family Education for ongoing care 1 20 []  - Staff obtains Programmer, systems, Records, Test Results / Process Orders 0 []  - Staff telephones HHA, Nursing Homes / Clarify orders / etc 0 []  - Routine Transfer to another Facility (non-emergent condition) 0 [name], [name] ([id number]) []  - Routine Hospital Admission (non-emergent condition) 0 []  - New Admissions / Biomedical engineer / Ordering NPWT, Apligraf, etc. 0 []  - Emergency Hospital Admission (emergent condition) 0 X - Simple Discharge Coordination 1 10 []  - Complex (extensive) Discharge Coordination 0 PROCESS - Special Needs []  - Pediatric / Minor Patient Management 0 []  - Isolation Patient Management 0 []  - Hearing / Language / Visual special needs 0 []  - Assessment of Community assistance (transportation, D/C planning, etc.) 0 []  - Additional assistance / Altered mentation 0 []  - Support Surface(s) Assessment (bed,  cushion, seat, etc.) 0 INTERVENTIONS - Wound Cleansing / Measurement []  - Simple Wound Cleansing - one wound 0 X - Complex Wound Cleansing - multiple wounds 1 5 X -  Wound Imaging (photographs - any number of wounds) 1 5 []  - Wound Tracing (instead of photographs) 0 []  - Simple Wound Measurement - one wound 0 []  - Complex Wound Measurement - multiple wounds 0 INTERVENTIONS - Wound Dressings []  - Small Wound Dressing one or multiple wounds 0 []  - Medium Wound Dressing one or multiple wounds 0 X - Large Wound Dressing one or multiple wounds 1 20 []  - Application of Medications - topical 0 []  - Application of Medications - injection 0 INTERVENTIONS - Miscellaneous []  - External ear exam 0 [name], [name]. ([id number]) []  - Specimen Collection (cultures, biopsies, blood, body fluids, etc.) 0 []  - Specimen(s) / Culture(s) sent or taken to Lab for analysis 0 []  - Patient Transfer (multiple staff / Harrel Lemon Lift / Similar devices) 0 []  - Simple Staple / Suture removal (25 or less) 0 []  - Complex Staple / Suture removal (26 or more) 0 []  - Hypo / Hyperglycemic Management (close monitor of Blood Glucose) 0 []  - Ankle / Brachial Index (ABI) - do not check if billed separately 0 X - Vital Signs 1 5 Has the patient been seen at the hospital within the last three years: Yes Total Score: 110 Level Of Care: New/Established - Level 3 Electronic Signature(s) Signed: 08/29/2015 4:37:51 PM By: Alric Quan Entered By: Alric Quan on 08/28/2015 16:24:15 [name] ([id number]) -------------------------------------------------------------------------------- Encounter Discharge Information Details Patient Name: [name]. Date of Service: 08/28/2015 3:15 PM Medical Record Number: [id number] Patient Account Number: [id number] Date of Birth/Sex: [birth year] (75 y.o. Male) Treating RN: Ahmed Prima Primary Care Physician: Daiva Eves Other Clinician: Referring Physician: Daiva Eves Treating Physician/Extender: Frann Rider in Treatment: 0 Encounter Discharge Information Items Discharge Pain Level: 0 Discharge Condition: Stable Ambulatory Status: Wheelchair Discharge Destination: Home Transportation: Private Auto wife and Accompanied By: daughter Schedule Follow-up Appointment: Yes Medication Reconciliation completed and provided to Patient/Care Yes Tamsin Nader: Provided on Clinical Summary of Care: 08/28/2015 Form Type Recipient Paper Patient CS Electronic Signature(s) Signed: 08/28/2015 4:07:37 PM By: Ruthine Dose Entered By: Ruthine Dose on 08/28/2015 16:07:37 [name] ([id number]) -------------------------------------------------------------------------------- Lower Extremity Assessment Details Patient Name: [name]. Date of Service: 08/28/2015 3:15 PM Medical Record Number: [id number] Patient Account Number: [id number] Date of Birth/Sex: 08-09-1940 (75 y.o. Male) Treating RN: Ahmed Prima Primary Care Physician: Daiva Eves Other Clinician: Referring Physician: Daiva Eves Treating Physician/Extender: Frann Rider in Treatment: 0 Electronic Signature(s) Signed: 08/29/2015 4:37:51 PM By: Alric Quan Entered By: Alric Quan on 08/28/2015 15:16:48 [name] ([id number]) -------------------------------------------------------------------------------- Multi Wound Chart Details Patient Name: [name]. Date of Service: 08/28/2015 3:15 PM Medical Record Number: [id number] Patient Account Number: [id number] Date of Birth/Sex: 22-Sep-1940 (75 y.o. Male) Treating RN: Ahmed Prima Primary Care Physician: Daiva Eves Other Clinician: Referring Physician: Daiva Eves Treating Physician/Extender: Frann Rider in Treatment: 0 Vital Signs Height(in): 73 Pulse(bpm): 62 Weight(lbs): 145 Blood Pressure 143/62 (mmHg): Body Mass Index(BMI): 19 Temperature(F):  97.8 Respiratory Rate 18 (breaths/min): Wound Assessments Treatment Notes Electronic Signature(s) Signed: 08/29/2015 4:37:51 PM By: Alric Quan Entered By: Alric Quan on 08/28/2015 15:23:16 [name] ([id number]) -------------------------------------------------------------------------------- [place] Details Patient Name: [name], [name]. Date of Service: 08/28/2015 3:15 PM Medical Record Number: [id number] Patient Account Number: [id number] Date of Birth/Sex: 06/25/1940 (75 y.o. Male) Treating RN: Ahmed Prima Primary Care Physician: Daiva Eves Other Clinician: Referring Physician: Daiva Eves Treating Physician/Extender: Frann Rider in Treatment: 0 Active Inactive Abuse / Safety / Falls / Self  Care Management Nursing Diagnoses: Potential for falls Goals: Patient will remain injury free Date Initiated: 08/26/2015 Goal Status: Active Interventions: Assess fall risk on admission and as needed Notes: Nutrition Nursing Diagnoses: Impaired glucose control: actual or potential Goals: Patient/caregiver will maintain therapeutic glucose control Date Initiated: 08/26/2015 Goal Status: Active Interventions: Provide education on elevated blood sugars and impact on wound healing Notes: Orientation to the Wound Care Program Nursing Diagnoses: Knowledge deficit related to the wound healing center program Goals: Patient/caregiver will verbalize understanding of the [facility] Date Initiated: 08/26/2015 [name], [name] ([id number]) Goal Status: Active Interventions: Provide education on orientation to the wound center Notes: Soft Tissue Infection Nursing Diagnoses: Impaired tissue integrity Goals: Patient will remain free of wound infection Date Initiated: 08/26/2015 Goal Status: Active Interventions: Assess signs and symptoms of infection every visit Notes: Wound/Skin Impairment Nursing  Diagnoses: Impaired tissue integrity Goals: Ulcer/skin breakdown will heal within 14 weeks Date Initiated: 08/26/2015 Goal Status: Active Interventions: Assess patient/caregiver ability to obtain necessary supplies Notes: Electronic Signature(s) Signed: 08/29/2015 4:37:51 PM By: Alric Quan Entered By: Alric Quan on 08/28/2015 15:23:11 [name] ([id number]) -------------------------------------------------------------------------------- Non-Wound Condition Assessment Details Patient Name: [name]. Date of Service: 08/28/2015 3:15 PM Medical Record Number: [id number] Patient Account Number: [id number] Date of Birth/Sex: 1940-11-09 (75 y.o. Male) Treating RN: Ahmed Prima Primary Care Physician: Daiva Eves Other Clinician: Referring Physician: Daiva Eves Treating Physician/Extender: Frann Rider in Treatment: 0 Non-Wound Condition: Condition: Rash / Dermatitis Location: Other: stomach around ostomy site Side: Photos Periwound Skin Texture Texture Color No Abnormalities Noted: No No Abnormalities Noted: No Callus: No Atrophie Blanche: No Crepitus: No Cyanosis: No Excoriation: Yes Ecchymosis: No Fluctuance: No Erythema: No Friable: No Hemosiderin Staining: No Induration: No Mottled: No Localized Edema: No Pallor: No Rash: Yes Rubor: No Scarring: No Temperature / Pain Moisture Temperature: No Abnormality No Abnormalities Noted: No Tenderness on Palpation: Yes Dry / Scaly: No Maceration: No Moist: Yes Electronic Signature(s) Signed: 08/29/2015 4:37:51 PM By: Alric Quan Entered By: Alric Quan on 08/28/2015 16:27:30 [name] ([id number] [name], [name] ([id number]) -------------------------------------------------------------------------------- Pain Assessment Details Patient Name: [name]. Date of Service: 08/28/2015 3:15 PM Medical Record Number: [id number] Patient Account Number:  [id number] Date of Birth/Sex: [birth year] (75 y.o. Male) Treating RN: Ahmed Prima Primary Care Physician: Daiva Eves Other Clinician: Referring Physician: Daiva Eves Treating Physician/Extender: Frann Rider in Treatment: 0 Active Problems Location of Pain Severity and Description of Pain Patient Has Paino Patient Unable to Respond Site Locations Pain Management and Medication Current Pain Management: Notes pt has dementia Electronic Signature(s) Signed: 08/29/2015 4:37:51 PM By: Alric Quan Entered By: Alric Quan on 08/28/2015 15:14:52 [name] ([id number]) -------------------------------------------------------------------------------- Patient/Caregiver Education Details Patient Name: [name]. Date of Service: 08/28/2015 3:15 PM Medical Record Number: [id number] Patient Account Number: [id number] Date of Birth/Gender: 1940/09/27 (75 y.o. Male) Treating RN: Ahmed Prima Primary Care Physician: Daiva Eves Other Clinician: Referring Physician: Daiva Eves Treating Physician/Extender: Frann Rider in Treatment: 0 Education Assessment Education Provided To: Patient Education Topics Provided Wound/Skin Impairment: Handouts: Other: change as needed Methods: Demonstration, Explain/Verbal Responses: State content correctly Electronic Signature(s) Signed: 08/29/2015 4:37:51 PM By: Alric Quan Entered By: Alric Quan on 08/28/2015 15:50:08 [name] ([id number]) -------------------------------------------------------------------------------- [place] Details Patient Name: [name]. Date of Service: 08/28/2015 3:15 PM Medical Record Number: [id number] Patient Account Number: [id number] Date of Birth/Sex: [birth year] (75 y.o. Male) Treating RN: Ahmed Prima Primary Care Physician: Daiva Eves Other Clinician:  Referring Physician: Daiva Eves Treating Physician/Extender: Frann Rider in Treatment: 0 Vital Signs Time Taken: 15:14 Temperature (F): 97.8 Height (in): 73 Pulse (bpm): 62 Weight (lbs): 145 Respiratory Rate (breaths/min): 18 Body Mass Index (BMI): 19.1 Blood Pressure (mmHg): 143/62 Reference Range: 80 - 120 mg / dl Electronic Signature(s) Signed: 08/29/2015 4:37:51 PM By: Alric Quan Entered By: Alric Quan on 08/28/2015 15:16:43

## 2015-09-02 ENCOUNTER — Ambulatory Visit: Payer: Medicare Other | Admitting: Internal Medicine

## 2015-09-03 DIAGNOSIS — A084 Viral intestinal infection, unspecified: Secondary | ICD-10-CM | POA: Diagnosis not present

## 2015-09-03 DIAGNOSIS — R0789 Other chest pain: Secondary | ICD-10-CM | POA: Diagnosis not present

## 2015-09-04 ENCOUNTER — Emergency Department: Payer: Medicare Other

## 2015-09-04 ENCOUNTER — Encounter: Payer: Self-pay | Admitting: Emergency Medicine

## 2015-09-04 ENCOUNTER — Inpatient Hospital Stay
Admission: EM | Admit: 2015-09-04 | Discharge: 2015-09-11 | DRG: 389 | Disposition: A | Discharge status: Skilled Nursing Facility | Payer: Medicare Other | Attending: Internal Medicine | Admitting: Internal Medicine

## 2015-09-04 DIAGNOSIS — K5190 Ulcerative colitis, unspecified, without complications: Secondary | ICD-10-CM | POA: Diagnosis not present

## 2015-09-04 DIAGNOSIS — I495 Sick sinus syndrome: Secondary | ICD-10-CM | POA: Diagnosis not present

## 2015-09-04 DIAGNOSIS — R531 Weakness: Secondary | ICD-10-CM

## 2015-09-04 DIAGNOSIS — Z7401 Bed confinement status: Secondary | ICD-10-CM | POA: Diagnosis not present

## 2015-09-04 DIAGNOSIS — N289 Disorder of kidney and ureter, unspecified: Secondary | ICD-10-CM | POA: Diagnosis not present

## 2015-09-04 DIAGNOSIS — I480 Paroxysmal atrial fibrillation: Secondary | ICD-10-CM

## 2015-09-04 DIAGNOSIS — F419 Anxiety disorder, unspecified: Secondary | ICD-10-CM | POA: Diagnosis present

## 2015-09-04 DIAGNOSIS — Z85048 Personal history of other malignant neoplasm of rectum, rectosigmoid junction, and anus: Secondary | ICD-10-CM | POA: Diagnosis not present

## 2015-09-04 DIAGNOSIS — R140 Abdominal distension (gaseous): Secondary | ICD-10-CM

## 2015-09-04 DIAGNOSIS — R809 Proteinuria, unspecified: Secondary | ICD-10-CM | POA: Diagnosis not present

## 2015-09-04 DIAGNOSIS — N400 Enlarged prostate without lower urinary tract symptoms: Secondary | ICD-10-CM | POA: Diagnosis present

## 2015-09-04 DIAGNOSIS — Z0189 Encounter for other specified special examinations: Secondary | ICD-10-CM

## 2015-09-04 DIAGNOSIS — N189 Chronic kidney disease, unspecified: Secondary | ICD-10-CM | POA: Diagnosis not present

## 2015-09-04 DIAGNOSIS — Z8673 Personal history of transient ischemic attack (TIA), and cerebral infarction without residual deficits: Secondary | ICD-10-CM | POA: Diagnosis not present

## 2015-09-04 DIAGNOSIS — I2510 Atherosclerotic heart disease of native coronary artery without angina pectoris: Secondary | ICD-10-CM | POA: Diagnosis not present

## 2015-09-04 DIAGNOSIS — J449 Chronic obstructive pulmonary disease, unspecified: Secondary | ICD-10-CM | POA: Diagnosis not present

## 2015-09-04 DIAGNOSIS — K219 Gastro-esophageal reflux disease without esophagitis: Secondary | ICD-10-CM | POA: Diagnosis not present

## 2015-09-04 DIAGNOSIS — Z66 Do not resuscitate: Secondary | ICD-10-CM | POA: Diagnosis not present

## 2015-09-04 DIAGNOSIS — E669 Obesity, unspecified: Secondary | ICD-10-CM | POA: Diagnosis present

## 2015-09-04 DIAGNOSIS — Z932 Ileostomy status: Secondary | ICD-10-CM | POA: Diagnosis not present

## 2015-09-04 DIAGNOSIS — K5669 Other intestinal obstruction: Secondary | ICD-10-CM | POA: Diagnosis not present

## 2015-09-04 DIAGNOSIS — F0150 Vascular dementia without behavioral disturbance: Secondary | ICD-10-CM | POA: Diagnosis present

## 2015-09-04 DIAGNOSIS — E7800 Pure hypercholesterolemia, unspecified: Secondary | ICD-10-CM | POA: Diagnosis not present

## 2015-09-04 DIAGNOSIS — R296 Repeated falls: Secondary | ICD-10-CM | POA: Diagnosis not present

## 2015-09-04 DIAGNOSIS — E785 Hyperlipidemia, unspecified: Secondary | ICD-10-CM | POA: Diagnosis present

## 2015-09-04 DIAGNOSIS — K56609 Unspecified intestinal obstruction, unspecified as to partial versus complete obstruction: Secondary | ICD-10-CM

## 2015-09-04 DIAGNOSIS — I4892 Unspecified atrial flutter: Secondary | ICD-10-CM | POA: Diagnosis not present

## 2015-09-04 DIAGNOSIS — E871 Hypo-osmolality and hyponatremia: Secondary | ICD-10-CM | POA: Diagnosis not present

## 2015-09-04 DIAGNOSIS — N183 Chronic kidney disease, stage 3 unspecified: Secondary | ICD-10-CM | POA: Diagnosis present

## 2015-09-04 DIAGNOSIS — I129 Hypertensive chronic kidney disease with stage 1 through stage 4 chronic kidney disease, or unspecified chronic kidney disease: Secondary | ICD-10-CM | POA: Diagnosis not present

## 2015-09-04 DIAGNOSIS — N179 Acute kidney failure, unspecified: Secondary | ICD-10-CM | POA: Diagnosis not present

## 2015-09-04 DIAGNOSIS — I255 Ischemic cardiomyopathy: Secondary | ICD-10-CM | POA: Diagnosis present

## 2015-09-04 DIAGNOSIS — N184 Chronic kidney disease, stage 4 (severe): Secondary | ICD-10-CM | POA: Diagnosis not present

## 2015-09-04 DIAGNOSIS — R197 Diarrhea, unspecified: Secondary | ICD-10-CM | POA: Diagnosis not present

## 2015-09-04 DIAGNOSIS — E1122 Type 2 diabetes mellitus with diabetic chronic kidney disease: Secondary | ICD-10-CM | POA: Diagnosis not present

## 2015-09-04 DIAGNOSIS — Z8551 Personal history of malignant neoplasm of bladder: Secondary | ICD-10-CM

## 2015-09-04 DIAGNOSIS — E861 Hypovolemia: Secondary | ICD-10-CM | POA: Diagnosis not present

## 2015-09-04 DIAGNOSIS — F329 Major depressive disorder, single episode, unspecified: Secondary | ICD-10-CM | POA: Diagnosis present

## 2015-09-04 DIAGNOSIS — Z794 Long term (current) use of insulin: Secondary | ICD-10-CM

## 2015-09-04 DIAGNOSIS — I10 Essential (primary) hypertension: Secondary | ICD-10-CM | POA: Diagnosis not present

## 2015-09-04 DIAGNOSIS — I5022 Chronic systolic (congestive) heart failure: Secondary | ICD-10-CM | POA: Diagnosis not present

## 2015-09-04 DIAGNOSIS — Z7982 Long term (current) use of aspirin: Secondary | ICD-10-CM | POA: Diagnosis not present

## 2015-09-04 DIAGNOSIS — Z4659 Encounter for fitting and adjustment of other gastrointestinal appliance and device: Secondary | ICD-10-CM

## 2015-09-04 DIAGNOSIS — R1311 Dysphagia, oral phase: Secondary | ICD-10-CM | POA: Diagnosis not present

## 2015-09-04 DIAGNOSIS — E119 Type 2 diabetes mellitus without complications: Secondary | ICD-10-CM | POA: Diagnosis not present

## 2015-09-04 DIAGNOSIS — Z4682 Encounter for fitting and adjustment of non-vascular catheter: Secondary | ICD-10-CM | POA: Diagnosis not present

## 2015-09-04 DIAGNOSIS — R410 Disorientation, unspecified: Secondary | ICD-10-CM

## 2015-09-04 DIAGNOSIS — M6281 Muscle weakness (generalized): Secondary | ICD-10-CM | POA: Diagnosis not present

## 2015-09-04 DIAGNOSIS — R278 Other lack of coordination: Secondary | ICD-10-CM | POA: Diagnosis not present

## 2015-09-04 DIAGNOSIS — I4891 Unspecified atrial fibrillation: Secondary | ICD-10-CM | POA: Diagnosis not present

## 2015-09-04 DIAGNOSIS — K5660 Unspecified intestinal obstruction: Principal | ICD-10-CM | POA: Diagnosis present

## 2015-09-04 DIAGNOSIS — Z6834 Body mass index (BMI) 34.0-34.9, adult: Secondary | ICD-10-CM

## 2015-09-04 HISTORY — DX: Unspecified intestinal obstruction, unspecified as to partial versus complete obstruction: K56.609

## 2015-09-04 HISTORY — DX: Repeated falls: R29.6

## 2015-09-04 HISTORY — DX: Chronic systolic (congestive) heart failure: I50.22

## 2015-09-04 HISTORY — DX: Vascular dementia, unspecified severity, without behavioral disturbance, psychotic disturbance, mood disturbance, and anxiety: F01.50

## 2015-09-04 LAB — CBC WITH DIFFERENTIAL/PLATELET
BASOS PCT: 0 %
Basophils Absolute: 0 10*3/uL (ref 0–0.1)
Eosinophils Absolute: 0 10*3/uL (ref 0–0.7)
Eosinophils Relative: 0 %
HEMATOCRIT: 39.7 % — AB (ref 40.0–52.0)
Hemoglobin: 13.7 g/dL (ref 13.0–18.0)
Lymphocytes Relative: 18 %
Lymphs Abs: 1.1 10*3/uL (ref 1.0–3.6)
MCH: 30.6 pg (ref 26.0–34.0)
MCHC: 34.5 g/dL (ref 32.0–36.0)
MCV: 88.5 fL (ref 80.0–100.0)
MONO ABS: 1 10*3/uL (ref 0.2–1.0)
MONOS PCT: 16 %
NEUTROS ABS: 4 10*3/uL (ref 1.4–6.5)
Neutrophils Relative %: 66 %
Platelets: 328 10*3/uL (ref 150–440)
RBC: 4.49 MIL/uL (ref 4.40–5.90)
RDW: 13.1 % (ref 11.5–14.5)
WBC: 6 10*3/uL (ref 3.8–10.6)

## 2015-09-04 LAB — COMPREHENSIVE METABOLIC PANEL WITH GFR
ALT: 54 U/L (ref 17–63)
AST: 59 U/L — ABNORMAL HIGH (ref 15–41)
Albumin: 4.1 g/dL (ref 3.5–5.0)
Alkaline Phosphatase: 55 U/L (ref 38–126)
Anion gap: 15 (ref 5–15)
BUN: 63 mg/dL — ABNORMAL HIGH (ref 6–20)
CO2: 23 mmol/L (ref 22–32)
Calcium: 9.4 mg/dL (ref 8.9–10.3)
Chloride: 91 mmol/L — ABNORMAL LOW (ref 101–111)
Creatinine, Ser: 3.75 mg/dL — ABNORMAL HIGH (ref 0.61–1.24)
GFR calc Af Amer: 17 mL/min — ABNORMAL LOW
GFR calc non Af Amer: 14 mL/min — ABNORMAL LOW
Glucose, Bld: 234 mg/dL — ABNORMAL HIGH (ref 65–99)
Potassium: 4.5 mmol/L (ref 3.5–5.1)
Sodium: 129 mmol/L — ABNORMAL LOW (ref 135–145)
Total Bilirubin: 1.6 mg/dL — ABNORMAL HIGH (ref 0.3–1.2)
Total Protein: 8 g/dL (ref 6.5–8.1)

## 2015-09-04 LAB — LACTIC ACID, PLASMA: Lactic Acid, Venous: 1.3 mmol/L (ref 0.5–2.0)

## 2015-09-04 LAB — URINALYSIS COMPLETE WITH MICROSCOPIC (ARMC ONLY)
BACTERIA UA: NONE SEEN
Bilirubin Urine: NEGATIVE
Glucose, UA: 50 mg/dL — AB
Hgb urine dipstick: NEGATIVE
Leukocytes, UA: NEGATIVE
NITRITE: NEGATIVE
PROTEIN: 100 mg/dL — AB
RBC / HPF: NONE SEEN RBC/hpf (ref 0–5)
Specific Gravity, Urine: 1.017 (ref 1.005–1.030)
pH: 5 (ref 5.0–8.0)

## 2015-09-04 LAB — TROPONIN I

## 2015-09-04 LAB — LIPASE, BLOOD: LIPASE: 23 U/L (ref 11–51)

## 2015-09-04 MED ORDER — DIATRIZOATE MEGLUMINE & SODIUM 66-10 % PO SOLN
15.0000 mL | Freq: Once | ORAL | Status: AC
  Administered 2015-09-04: 15 mL via ORAL

## 2015-09-04 MED ORDER — SODIUM CHLORIDE 0.9 % IV SOLN
Freq: Once | INTRAVENOUS | Status: AC
  Administered 2015-09-04: 22:00:00 via INTRAVENOUS

## 2015-09-04 NOTE — ED Notes (Signed)
NG placed by this nurse with Mauricia Area, RN assisting and verified auscultation.  Pt awaiting xray for confirmation of placement.

## 2015-09-04 NOTE — ED Notes (Signed)
NG tube inserted to 58cm and secured to nose with tape.  Pt tolerating well at this time.

## 2015-09-04 NOTE — ED Notes (Signed)
Pt presents to ED with c/o altered mental status and diarrhea/nausea/vomiting x3 days. Hx of dementia.

## 2015-09-04 NOTE — ED Provider Notes (Signed)
[facility] Emergency Department Provider Note   ____________________________________________  Time seen: Approximately 7:28 PM  I have reviewed the triage vital signs and the nursing notes.   HISTORY  Chief Complaint Diarrhea and Emesis   HPI [name] is a 75 y.o. male with about 4 days of nausea and increased watery output from his ileostomy and vomiting twice. Patient reports he is not eating doesn't feel hungry he is weak and has fallen several times his family doctor gave him some nausea medicine and hasn't really helped much. Patient reports mild diffuse abdominal pain. Patient's ileostomy output has been dark in color but is Hemoccult negative in the ER.   Past Medical History  Diagnosis Date  . Gout   . Depression   . Diabetes mellitus   . Unspecified essential hypertension   . Hypercholesteremia     IIA  . Colitis, ulcerative ([place])     NOS  . Personal hx-rectal/anal malignancy   . Urinary incontinence   . Benign prostatic hypertrophy     S/p TURP   . H/O ETOH abuse 06/06/2011  . Uncontrolled secondary diabetes mellitus with stage 3 CKD (GFR 30-59) ([place]) 06/05/2011  . Pleurisy   . AAA (abdominal aortic aneurysm) ([place]) 1992    S/p repair and grafting  . History of alcohol abuse   . CAD (coronary artery disease)   . Stroke [facility])     75 yrs old  . Stones in the urinary tract   . GERD (gastroesophageal reflux disease)   . Normocytic anemia     Chronic  . PAF (paroxysmal atrial fibrillation) ([place])     during hospitalization 06/2011 for sepsis  . Cardiomyopathy, ischemic     EF 40-45% by echo 06/2011  . Bladder cancer ([place]) 2006  . 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 Colon cancer ([place])   . Chronic bronchitis ([place])     "used to get it quite often; found out medicine was causing it"  . Arthritis     "left knee"  . Memory disorder 04/23/2013  . Obesity   . Dementia     Patient Active Problem List   Diagnosis Date  Noted  . Diabetes ([place]) 06/19/2015  . Abnormality of gait 06/26/2014  . Memory disorder 04/23/2013  . Acute renal failure ([place]) 10/27/2011  . Atrial flutter, paroxysmal ([place]) 06/18/2011  . Staphylococcus aureus bacteremia 06/14/2011  . 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 CKD (chronic kidney disease) stage 3, GFR 30-59 ml/min 06/12/2011  . Morbid obesity ([place]) 06/12/2011  . Hyponatremia 06/12/2011  . H/O ETOH abuse 06/06/2011  . Hyperkalemia 06/05/2011  . Normocytic anemia 06/05/2011  . SYNCOPE AND COLLAPSE 07/18/2008  . HYPERCHOLESTEROLEMIA  IIA 07/16/2008  . GOUT 07/16/2008  . ANEMIA, NORMOCYTIC, CHRONIC 07/16/2008  . DEPRESSION 07/16/2008  . HYPERTENSION, UNSPECIFIED 07/16/2008  . CAD, UNSPECIFIED SITE 07/16/2008  . COLITIS, ULCERATIVE NOS 05/25/2006  . BENIGN PROSTATIC HYPERTROPHY 05/25/2006  . URINARY INCONTINENCE 05/25/2006  . HX, PERSONAL, MALIGNANCY, RECTUM/ANUS 05/25/2006    Past Surgical History  Procedure Laterality Date  . Transurethral resection of prostate    . Abdominal aortic aneurysm repair  1992    "had 3 blockages; 3 aneurysms repaired at the same time"  . I&d knee with poly exchange  06/12/2011    Procedure: IRRIGATION AND DEBRIDEMENT KNEE WITH POLY EXCHANGE;  Surgeon: Yvette Rack., MD;  Location: [facility];  Service: Orthopedics;  Laterality:  Left;  . Appendectomy    . Eye surgery    . Coronary angioplasty      1992  . Colonoscopy w/ polypectomy    . Cataract extraction w/ intraocular lens  implant, bilateral  ~ 2010  . Total knee arthroplasty  1990's    left  . Joint replacement      left knee  . Total knee revision  10/26/11    left  . Total knee revision  10/26/2011    Procedure: TOTAL KNEE REVISION;  Surgeon: Garald Balding, MD;  Location: [place];  Service: Orthopedics;  Laterality: Left;  left total knee revision  . Angioplasty    . Bladder tumor excision  2002  . Compound fracture ankle  08/17/2003    Current  Outpatient Rx  Name  Route  Sig  Dispense  Refill  . diltiazem (CARDIZEM CD) 240 MG 24 hr capsule   Oral   Take 240 mg by mouth daily.         Marland Kitchen donepezil (ARICEPT) 10 MG tablet   Oral   Take 1 tablet (10 mg total) by mouth at bedtime.   90 tablet   2   . fenofibrate micronized (LOFIBRA) 134 MG capsule   Oral   Take 1 capsule by mouth daily.         . fluocinonide cream (LIDEX) 0.05 %   Topical   Apply 1 application topically 2 (two) times daily.          . fluticasone (FLONASE) 50 MCG/ACT nasal spray   Each Nare   Place 1 spray into both nostrils daily as needed for allergies.          Marland Kitchen HUMALOG KWIKPEN 100 UNIT/ML KiwkPen   Subcutaneous   Inject into the skin 3 (three) times daily.            Dispense as written.   . insulin NPH Human (HUMULIN N) 100 UNIT/ML injection   Subcutaneous   Inject 0.7 mLs (70 Units total) into the skin daily before breakfast.   30 mL   4   . metoprolol tartrate (LOPRESSOR) 25 MG tablet   Oral   Take 25 mg by mouth 2 (two) times daily.         . ondansetron (ZOFRAN) 4 MG tablet   Oral   Take 4 mg by mouth every 8 (eight) hours as needed.          . pantoprazole (PROTONIX) 40 MG tablet   Oral   Take 40 mg by mouth daily at 12 noon.         Marland Kitchen PARoxetine (PAXIL) 40 MG tablet   Oral   Take 1 tablet by mouth daily.         . rosuvastatin (CRESTOR) 20 MG tablet   Oral   Take 20 mg by mouth at bedtime.         Marland Kitchen albuterol (PROVENTIL HFA;VENTOLIN HFA) 108 (90 BASE) MCG/ACT inhaler   Inhalation   Inhale 2 puffs into the lungs every 6 (six) hours as needed for wheezing or shortness of breath. Reported on 06/18/2015         . aspirin EC 81 MG tablet   Oral   Take 81 mg by mouth daily.         . Omega-3 Fatty Acids (FISH OIL) 1200 MG CAPS   Oral   Take 1,200 mg by mouth 2 (two) times daily.  Allergies Fentanyl; Hydromorphone hcl; Lorazepam; Oxycodone-acetaminophen; Lisinopril; and  Vicodin  Family History  Problem Relation Age of Onset  . Dementia Mother   . COPD Father   . Anesthesia problems Neg Hx   . Dementia Sister   . Cancer Brother   . Cancer Brother   . Dementia Brother   . Diabetes Neg Hx     Social History Social History  Substance Use Topics  . Smoking status: Former Smoker -- 2.00 packs/day for 12 years    Types: Cigarettes    Quit date: 06/04/1970  . Smokeless tobacco: Current User    Types: Chew  . Alcohol Use: No     Comment: Occasional    Review of Systems Constitutional: No fever/chills Eyes: No visual changes. ENT: No sore throat. Cardiovascular: Denies chest pain. Respiratory: Denies shortness of breath. Gastrointestinal: See history of present illness Genitourinary: Negative for dysuria. Musculoskeletal: Negative for back pain. Skin: Negative for rash. Neurological: Negative for headaches, focal weakness or numbness.  10-point ROS otherwise negative.  ____________________________________________   PHYSICAL EXAM:  VITAL SIGNS: ED Triage Vitals  Enc Vitals Group     BP 09/04/15 1914 116/54 mmHg     Pulse Rate 09/04/15 1914 77     Resp 09/04/15 1914 19     Temp 09/04/15 1914 97.8 F (36.6 C)     Temp Source 09/04/15 1914 Oral     SpO2 09/04/15 1914 95 %     Weight 09/04/15 1914 243 lb (110.224 kg)     Height 09/04/15 1914 5\' 10"  (1.778 m)     Head Cir --      Peak Flow --      Pain Score 09/04/15 1915 0     Pain Loc --      Pain Edu? --      Excl. in [place]? --   Constitutional: Alert and oriented. Well appearing and in no acute distress. Eyes: Conjunctivae are normal. PERRL. EOMI. Head: Atraumatic. Nose: No congestion/rhinnorhea. Mouth/Throat: Mucous membranes are moist.  Oropharynx non-erythematous. Neck: No stridor.  Cardiovascular: Normal rate, regular rhythm. Grossly normal heart sounds.  Good peripheral circulation. Respiratory: Normal respiratory effort.  No retractions. Lungs CTAB. Gastrointestinal:  Soft Mild diffuse tenderness to palpation percussion. Ileostomy on the right side and is dark output. It is Hemoccult negative.. No distention. No abdominal bruits. No CVA tenderness. Musculoskeletal: No lower extremity tenderness nor edema.  No joint effusions. Neurologic:  Normal speech and language. No gross focal neurologic deficits are appreciated. No gait instability. Skin:  Skin is warm, dry and intact. No rash noted. Psychiatric: Mood and affect are normal. Speech and behavior are normal.  ____________________________________________   LABS (all labs ordered are listed, but only abnormal results are displayed)  Labs Reviewed  COMPREHENSIVE METABOLIC PANEL - Abnormal; Notable for the following:    Sodium 129 (*)    Chloride 91 (*)    Glucose, Bld 234 (*)    BUN 63 (*)    Creatinine, Ser 3.75 (*)    AST 59 (*)    Total Bilirubin 1.6 (*)    GFR calc non Af Amer 14 (*)    GFR calc Af Amer 17 (*)    All other components within normal limits  CBC WITH DIFFERENTIAL/PLATELET - Abnormal; Notable for the following:    HCT 39.7 (*)    All other components within normal limits  LIPASE, BLOOD  TROPONIN I  LACTIC ACID, PLASMA  LACTIC ACID, PLASMA  URINALYSIS COMPLETEWITH MICROSCOPIC (  [facility] ONLY)   ____________________________________________  EKG  EKG read and interpreted by me shows normal sinus rhythm rate of 78 left axis patient has flipped T waves in one L and most of the precordial leads which are new from his previous EKG. ____________________________________________  RADIOLOGY  CLINICAL DATA: Weakness. H/o stroke, afib, cardiomyopathy, and coronary artery disease.  EXAM: PORTABLE CHEST 1 VIEW  COMPARISON: 07/03/2013  FINDINGS: Cardiac silhouette is normal in size. No mediastinal or hilar masses or evidence of adenopathy.  Clear lungs. Relative lucency in the upper lobes suggests emphysema.  No pleural effusion or pneumothorax.  Bony thorax is  intact.  IMPRESSION: No active disease.   Electronically Signed  By: Lajean Manes M.D.  On: 09/04/2015 19:47 ____________________________________________   PROCEDURES    ____________________________________________   INITIAL IMPRESSION / ASSESSMENT AND PLAN / ED COURSE  Pertinent labs & imaging results that were available during my care of the patient were reviewed by me and considered in my medical decision making (see chart for details). Patient signed out to Dr. Mariea Clonts ____________________________________________   FINAL CLINICAL IMPRESSION(S) / ED DIAGNOSES  Final diagnoses:  Weak      NEW MEDICATIONS STARTED DURING THIS VISIT:  New Prescriptions   No medications on file     Note:  This document was prepared using Dragon voice recognition software and may include unintentional dictation errors.    Nena Polio, MD 09/04/15 2029

## 2015-09-05 ENCOUNTER — Inpatient Hospital Stay: Payer: Medicare Other

## 2015-09-05 ENCOUNTER — Ambulatory Visit: Payer: Medicare Other | Admitting: Surgery

## 2015-09-05 DIAGNOSIS — Z932 Ileostomy status: Secondary | ICD-10-CM | POA: Diagnosis not present

## 2015-09-05 DIAGNOSIS — K219 Gastro-esophageal reflux disease without esophagitis: Secondary | ICD-10-CM | POA: Diagnosis present

## 2015-09-05 DIAGNOSIS — K5669 Other intestinal obstruction: Secondary | ICD-10-CM | POA: Diagnosis not present

## 2015-09-05 DIAGNOSIS — K56609 Unspecified intestinal obstruction, unspecified as to partial versus complete obstruction: Secondary | ICD-10-CM

## 2015-09-05 DIAGNOSIS — I480 Paroxysmal atrial fibrillation: Secondary | ICD-10-CM | POA: Diagnosis present

## 2015-09-05 DIAGNOSIS — Z85048 Personal history of other malignant neoplasm of rectum, rectosigmoid junction, and anus: Secondary | ICD-10-CM | POA: Diagnosis not present

## 2015-09-05 DIAGNOSIS — N184 Chronic kidney disease, stage 4 (severe): Secondary | ICD-10-CM | POA: Diagnosis present

## 2015-09-05 DIAGNOSIS — E785 Hyperlipidemia, unspecified: Secondary | ICD-10-CM | POA: Diagnosis present

## 2015-09-05 DIAGNOSIS — Z8551 Personal history of malignant neoplasm of bladder: Secondary | ICD-10-CM | POA: Diagnosis not present

## 2015-09-05 DIAGNOSIS — R197 Diarrhea, unspecified: Secondary | ICD-10-CM | POA: Diagnosis present

## 2015-09-05 DIAGNOSIS — E871 Hypo-osmolality and hyponatremia: Secondary | ICD-10-CM | POA: Diagnosis present

## 2015-09-05 DIAGNOSIS — I255 Ischemic cardiomyopathy: Secondary | ICD-10-CM | POA: Diagnosis present

## 2015-09-05 DIAGNOSIS — E669 Obesity, unspecified: Secondary | ICD-10-CM | POA: Diagnosis present

## 2015-09-05 DIAGNOSIS — I5022 Chronic systolic (congestive) heart failure: Secondary | ICD-10-CM | POA: Diagnosis present

## 2015-09-05 DIAGNOSIS — J449 Chronic obstructive pulmonary disease, unspecified: Secondary | ICD-10-CM | POA: Diagnosis present

## 2015-09-05 DIAGNOSIS — Z66 Do not resuscitate: Secondary | ICD-10-CM | POA: Diagnosis not present

## 2015-09-05 DIAGNOSIS — I4891 Unspecified atrial fibrillation: Secondary | ICD-10-CM | POA: Diagnosis not present

## 2015-09-05 DIAGNOSIS — I10 Essential (primary) hypertension: Secondary | ICD-10-CM | POA: Diagnosis not present

## 2015-09-05 DIAGNOSIS — R296 Repeated falls: Secondary | ICD-10-CM | POA: Diagnosis present

## 2015-09-05 DIAGNOSIS — Z794 Long term (current) use of insulin: Secondary | ICD-10-CM | POA: Diagnosis not present

## 2015-09-05 DIAGNOSIS — F329 Major depressive disorder, single episode, unspecified: Secondary | ICD-10-CM | POA: Diagnosis present

## 2015-09-05 DIAGNOSIS — I4892 Unspecified atrial flutter: Secondary | ICD-10-CM | POA: Diagnosis present

## 2015-09-05 DIAGNOSIS — E861 Hypovolemia: Secondary | ICD-10-CM | POA: Diagnosis present

## 2015-09-05 DIAGNOSIS — F419 Anxiety disorder, unspecified: Secondary | ICD-10-CM | POA: Diagnosis present

## 2015-09-05 DIAGNOSIS — E1122 Type 2 diabetes mellitus with diabetic chronic kidney disease: Secondary | ICD-10-CM | POA: Diagnosis present

## 2015-09-05 DIAGNOSIS — I2510 Atherosclerotic heart disease of native coronary artery without angina pectoris: Secondary | ICD-10-CM | POA: Diagnosis present

## 2015-09-05 DIAGNOSIS — Z8673 Personal history of transient ischemic attack (TIA), and cerebral infarction without residual deficits: Secondary | ICD-10-CM | POA: Diagnosis not present

## 2015-09-05 DIAGNOSIS — F0150 Vascular dementia without behavioral disturbance: Secondary | ICD-10-CM | POA: Diagnosis present

## 2015-09-05 DIAGNOSIS — Z6834 Body mass index (BMI) 34.0-34.9, adult: Secondary | ICD-10-CM | POA: Diagnosis not present

## 2015-09-05 DIAGNOSIS — K5660 Unspecified intestinal obstruction: Secondary | ICD-10-CM | POA: Diagnosis present

## 2015-09-05 DIAGNOSIS — N179 Acute kidney failure, unspecified: Secondary | ICD-10-CM | POA: Diagnosis present

## 2015-09-05 DIAGNOSIS — Z7982 Long term (current) use of aspirin: Secondary | ICD-10-CM | POA: Diagnosis not present

## 2015-09-05 DIAGNOSIS — N400 Enlarged prostate without lower urinary tract symptoms: Secondary | ICD-10-CM | POA: Diagnosis present

## 2015-09-05 LAB — CBC
HCT: 37.1 % — ABNORMAL LOW (ref 40.0–52.0)
Hemoglobin: 13 g/dL (ref 13.0–18.0)
MCH: 30.9 pg (ref 26.0–34.0)
MCHC: 34.9 g/dL (ref 32.0–36.0)
MCV: 88.6 fL (ref 80.0–100.0)
PLATELETS: 285 10*3/uL (ref 150–440)
RBC: 4.19 MIL/uL — AB (ref 4.40–5.90)
RDW: 13.3 % (ref 11.5–14.5)
WBC: 5.1 10*3/uL (ref 3.8–10.6)

## 2015-09-05 LAB — BASIC METABOLIC PANEL
ANION GAP: 17 — AB (ref 5–15)
BUN: 67 mg/dL — ABNORMAL HIGH (ref 6–20)
CALCIUM: 9.4 mg/dL (ref 8.9–10.3)
CO2: 25 mmol/L (ref 22–32)
CREATININE: 3.59 mg/dL — AB (ref 0.61–1.24)
Chloride: 91 mmol/L — ABNORMAL LOW (ref 101–111)
GFR, EST AFRICAN AMERICAN: 18 mL/min — AB (ref >60)
GFR, EST NON AFRICAN AMERICAN: 15 mL/min — AB (ref >60)
Glucose, Bld: 174 mg/dL — ABNORMAL HIGH (ref 65–99)
Potassium: 5.1 mmol/L (ref 3.5–5.1)
SODIUM: 133 mmol/L — AB (ref 135–145)

## 2015-09-05 LAB — GLUCOSE, CAPILLARY
GLUCOSE-CAPILLARY: 182 mg/dL — AB (ref 65–99)
GLUCOSE-CAPILLARY: 163 mg/dL — AB (ref 65–99)
GLUCOSE-CAPILLARY: 177 mg/dL — AB (ref 65–99)
Glucose-Capillary: 156 mg/dL — ABNORMAL HIGH (ref 65–99)
Glucose-Capillary: 150 mg/dL — ABNORMAL HIGH (ref 65–99)

## 2015-09-05 LAB — HEMOGLOBIN A1C: HEMOGLOBIN A1C: 6.6 % — AB (ref 4.0–6.0)

## 2015-09-05 MED ORDER — ALBUTEROL SULFATE (2.5 MG/3ML) 0.083% IN NEBU: 3.0000 mL | INHALATION_SOLUTION | Freq: Four times a day (QID) | RESPIRATORY_TRACT | Status: DC | PRN

## 2015-09-05 MED ORDER — SODIUM CHLORIDE 0.9 % IV SOLN
INTRAVENOUS | Status: DC
  Administered 2015-09-05 – 2015-09-08 (×4): via INTRAVENOUS

## 2015-09-05 MED ORDER — ONDANSETRON HCL 4 MG/2ML IJ SOLN: 4.0000 mg | Freq: Four times a day (QID) | INTRAMUSCULAR | Status: DC | PRN

## 2015-09-05 MED ORDER — ACETAMINOPHEN 325 MG PO TABS
650.0000 mg | ORAL_TABLET | Freq: Four times a day (QID) | ORAL | Status: DC | PRN
  Administered 2015-09-08: 650 mg via ORAL
  Filled 2015-09-05: qty 2

## 2015-09-05 MED ORDER — ASPIRIN EC 81 MG PO TBEC
81.0000 mg | DELAYED_RELEASE_TABLET | Freq: Every day | ORAL | Status: DC
  Administered 2015-09-05 – 2015-09-11 (×7): 81 mg via ORAL
  Filled 2015-09-05 (×7): qty 1

## 2015-09-05 MED ORDER — DONEPEZIL HCL 5 MG PO TABS
10.0000 mg | ORAL_TABLET | Freq: Every day | ORAL | Status: DC
  Administered 2015-09-05 – 2015-09-10 (×6): 10 mg via ORAL
  Filled 2015-09-05 (×6): qty 2

## 2015-09-05 MED ORDER — ONDANSETRON HCL 4 MG PO TABS: 4.0000 mg | ORAL_TABLET | Freq: Four times a day (QID) | ORAL | Status: DC | PRN

## 2015-09-05 MED ORDER — PAROXETINE HCL 10 MG PO TABS
40.0000 mg | ORAL_TABLET | Freq: Every day | ORAL | Status: DC
  Administered 2015-09-05 – 2015-09-11 (×7): 40 mg via ORAL
  Filled 2015-09-05 (×7): qty 4

## 2015-09-05 MED ORDER — ALPRAZOLAM 0.25 MG PO TABS
0.5000 mg | ORAL_TABLET | Freq: Once | ORAL | Status: AC
  Administered 2015-09-05: 0.5 mg via ORAL
  Filled 2015-09-05: qty 2

## 2015-09-05 MED ORDER — ACETAMINOPHEN 650 MG RE SUPP
650.0000 mg | Freq: Four times a day (QID) | RECTAL | Status: DC | PRN
Start: 2015-09-05 — End: 2015-09-11

## 2015-09-05 MED ORDER — INSULIN ASPART 100 UNIT/ML ~~LOC~~ SOLN
0.0000 [IU] | Freq: Four times a day (QID) | SUBCUTANEOUS | Status: DC
  Administered 2015-09-05 (×3): 2 [IU] via SUBCUTANEOUS
  Administered 2015-09-05: 1 [IU] via SUBCUTANEOUS
  Administered 2015-09-06: 2 [IU] via SUBCUTANEOUS
  Filled 2015-09-05: qty 1
  Filled 2015-09-05 (×4): qty 2

## 2015-09-05 MED ORDER — SODIUM CHLORIDE 0.9 % IV SOLN
INTRAVENOUS | Status: DC
  Administered 2015-09-05: 04:00:00 via INTRAVENOUS

## 2015-09-05 MED ORDER — HEPARIN SODIUM (PORCINE) 5000 UNIT/ML IJ SOLN
5000.0000 [IU] | Freq: Three times a day (TID) | INTRAMUSCULAR | Status: DC
  Administered 2015-09-05 – 2015-09-11 (×19): 5000 [IU] via SUBCUTANEOUS
  Filled 2015-09-05 (×18): qty 1

## 2015-09-05 MED ORDER — DILTIAZEM HCL ER COATED BEADS 240 MG PO CP24
240.0000 mg | ORAL_CAPSULE | Freq: Every day | ORAL | Status: DC
  Administered 2015-09-05 – 2015-09-11 (×7): 240 mg via ORAL
  Filled 2015-09-05 (×7): qty 1

## 2015-09-05 MED ORDER — ROSUVASTATIN CALCIUM 20 MG PO TABS
20.0000 mg | ORAL_TABLET | Freq: Every day | ORAL | Status: DC
  Administered 2015-09-05 – 2015-09-10 (×6): 20 mg via ORAL
  Filled 2015-09-05 (×6): qty 1

## 2015-09-05 MED ORDER — PANTOPRAZOLE SODIUM 40 MG PO TBEC
40.0000 mg | DELAYED_RELEASE_TABLET | Freq: Every day | ORAL | Status: DC
  Administered 2015-09-05 – 2015-09-10 (×6): 40 mg via ORAL
  Filled 2015-09-05 (×5): qty 1

## 2015-09-05 MED ORDER — METOPROLOL TARTRATE 25 MG PO TABS
25.0000 mg | ORAL_TABLET | Freq: Two times a day (BID) | ORAL | Status: DC
  Administered 2015-09-05 – 2015-09-11 (×13): 25 mg via ORAL
  Filled 2015-09-05 (×13): qty 1

## 2015-09-05 NOTE — Consult Note (Signed)
Surgical Consultation  09/05/2015  [name] is an 75 y.o. male.   CC: Small bowel obstruction  HPI: This patient admitted through the emergency room yesterday with a diagnosis of small bowel obstruction surgery was consult it for management as he has multiple medical problems and was admitted to the medical service. Patient is an extremely difficult historian. No family is present to assist in questioning. He states that he vomited twice and when asked when he vomited most recently he states it was 2 weeks ago. He states that he is having normal function of his ileostomy although he calls of the colostomy. He also states he had abdominal pain this been going on for several months especially after he eats. He states that sometimes he can't stand it. With the somewhat chronic nature of this I asked the patient "widened to come to the emergency room yesterday" he answered "because he (my Dr.) told me to". When I asked "why did you go to the doctor" he answered "because my daughter took me." When I asked him about his pain he states that it is all in his back and shoulders and he denies any abdominal pain at this point. While he answers some questions he sometimes is as if he is hard of hearing but then he he yelled at me and said "I'm not stupid."  With this difficulty in obtaining a history and the inability to obtain a review of systems it is not clear why the patient is in the hospital other than a CT scan suggest obstructing directed small bowel. Also of note he has pulled out his NG tube and refused to have it replaced. The reliability of his answers are in question and it is not clear if this is a chronic problem with his pain lasting for several months and not having vomited in 2 weeks or if these are just not reliable answers. May have to wait till family returns to get better answers.  Past Medical History  Diagnosis Date  . Gout   . Depression   . Diabetes mellitus   . Unspecified  essential hypertension   . Hypercholesteremia     IIA  . Colitis, ulcerative ([place])     NOS  . Personal hx-rectal/anal malignancy   . Urinary incontinence   . Benign prostatic hypertrophy     S/p TURP   . H/O ETOH abuse 06/06/2011  . Uncontrolled secondary diabetes mellitus with stage 3 CKD (GFR 30-59) ([place]) 06/05/2011  . Pleurisy   . AAA (abdominal aortic aneurysm) ([place]) 1992    S/p repair and grafting  . History of alcohol abuse   . CAD (coronary artery disease)   . Stroke [facility])     75 yrs old  . Stones in the urinary tract   . GERD (gastroesophageal reflux disease)   . Normocytic anemia     Chronic  . PAF (paroxysmal atrial fibrillation) ([place])     during hospitalization 06/2011 for sepsis  . Cardiomyopathy, ischemic     EF 40-45% by echo 06/2011  . Bladder cancer ([place]) 2006  . 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 Colon cancer ([place])   . Chronic bronchitis ([place])     "used to get it quite often; found out medicine was causing it"  . Arthritis     "left knee"  . Memory disorder 04/23/2013  . Obesity   . Dementia     Past Surgical History  Procedure Laterality Date  .  Transurethral resection of prostate    . Abdominal aortic aneurysm repair  1992    "had 3 blockages; 3 aneurysms repaired at the same time"  . I&d knee with poly exchange  06/12/2011    Procedure: IRRIGATION AND DEBRIDEMENT KNEE WITH POLY EXCHANGE;  Surgeon: Yvette Rack., MD;  Location: [facility];  Service: Orthopedics;  Laterality: Left;  . Appendectomy    . Eye surgery    . Coronary angioplasty      1992  . Colonoscopy w/ polypectomy    . Cataract extraction w/ intraocular lens  implant, bilateral  ~ 2010  . Total knee arthroplasty  1990's    left  . Joint replacement      left knee  . Total knee revision  10/26/11    left  . Total knee revision  10/26/2011    Procedure: TOTAL KNEE REVISION;  Surgeon: Garald Balding, MD;  Location: [facility];  Service: Orthopedics;  Laterality: Left;  left total  knee revision  . Angioplasty    . Bladder tumor excision  2002  . Compound fracture ankle  08/17/2003    Family History  Problem Relation Age of Onset  . Dementia Mother   . COPD Father   . Anesthesia problems Neg Hx   . Dementia Sister   . Cancer Brother   . Cancer Brother   . Dementia Brother   . Diabetes Neg Hx     Social History:  reports that he quit smoking about 45 years ago. His smoking use included Cigarettes. He has a 24 pack-year smoking history. His smokeless tobacco use includes Chew. He reports that he does not drink alcohol or use illicit drugs.  Allergies:  Allergies  Allergen Reactions  . Fentanyl Other (See Comments)    Delirium  . Hydromorphone Hcl Other (See Comments)    Delirium  . Lorazepam Other (See Comments)    Delirium  . Oxycodone-Acetaminophen Other (See Comments)    Delirium  . Lisinopril   . Vicodin [Hydrocodone-Acetaminophen]     Medications reviewed.   Review of Systems:   Review of Systems  Unable to perform ROS: mental acuity     Physical Exam:  BP 140/61 mmHg  Pulse 77  Temp(Src) 97.6 F (36.4 C) (Oral)  Resp 18  Ht 5' 10"  (1.778 m)  Wt 237 lb 8 oz (107.729 kg)  BMI 34.08 kg/m2  SpO2 92%  Physical Exam  Constitutional: He is well-developed, well-nourished, and in no distress. No distress.  Elderly comfortable-appearing male patient no nasogastric tube in place  HENT:  Head: Normocephalic and atraumatic.  Eyes: Pupils are equal, round, and reactive to light. Right eye exhibits no discharge. Left eye exhibits no discharge. No scleral icterus.  Neck: Normal range of motion.  Cardiovascular: Normal rate, regular rhythm and normal heart sounds.   Pulmonary/Chest: Effort normal and breath sounds normal. No respiratory distress. He has no wheezes. He has no rales.  Abdominal: Soft. He exhibits no distension. There is no tenderness. There is no rebound and no guarding.  Nondistended nontympanitic and nontender abdomen scars  are noted a right lower quadrant ileostomy is present with liquid stool or succus present in the bag and copious amounts  Genitourinary: Penis normal. No discharge found.  Musculoskeletal: Normal range of motion. He exhibits edema. He exhibits no tenderness.  Lymphadenopathy:    He has no cervical adenopathy.  Neurological: He is alert.  Cannot determine orientation is a patient does not answer questions well  Skin: Skin is warm and dry. Rash noted. He is not diaphoretic. There is erythema.  Some mild erythema of a chronic nature in the right lower abdomen around the ileostomy.  Psychiatric:  Patient does not answer questions appropriately as best as I can tell and further psychiatric evaluation cannot be performed  Vitals reviewed.     Results for orders placed or performed during the hospital encounter of 09/04/15 (from the past 48 hour(s))  Comprehensive metabolic panel     Status: Abnormal   Collection Time: 09/04/15  7:10 PM  Result Value Ref Range   Sodium 129 (L) 135 - 145 mmol/L   Potassium 4.5 3.5 - 5.1 mmol/L   Chloride 91 (L) 101 - 111 mmol/L   CO2 23 22 - 32 mmol/L   Glucose, Bld 234 (H) 65 - 99 mg/dL   BUN 63 (H) 6 - 20 mg/dL   Creatinine, Ser 3.75 (H) 0.61 - 1.24 mg/dL   Calcium 9.4 8.9 - 10.3 mg/dL   Total Protein 8.0 6.5 - 8.1 g/dL   Albumin 4.1 3.5 - 5.0 g/dL   AST 59 (H) 15 - 41 U/L   ALT 54 17 - 63 U/L   Alkaline Phosphatase 55 38 - 126 U/L   Total Bilirubin 1.6 (H) 0.3 - 1.2 mg/dL   GFR calc non Af Amer 14 (L) >60 mL/min   GFR calc Af Amer 17 (L) >60 mL/min    Comment: (NOTE) The eGFR has been calculated using the CKD EPI equation. This calculation has not been validated in all clinical situations. eGFR's persistently <60 mL/min signify possible Chronic Kidney Disease.    Anion gap 15 5 - 15  Lipase, blood     Status: None   Collection Time: 09/04/15  7:10 PM  Result Value Ref Range   Lipase 23 11 - 51 U/L  Troponin I     Status: None   Collection  Time: 09/04/15  7:10 PM  Result Value Ref Range   Troponin I <0.03 <0.031 ng/mL    Comment:        NO INDICATION OF MYOCARDIAL INJURY.   Lactic acid, plasma     Status: None   Collection Time: 09/04/15  7:10 PM  Result Value Ref Range   Lactic Acid, Venous 1.3 0.5 - 2.0 mmol/L  CBC with Differential     Status: Abnormal   Collection Time: 09/04/15  7:10 PM  Result Value Ref Range   WBC 6.0 3.8 - 10.6 K/uL   RBC 4.49 4.40 - 5.90 MIL/uL   Hemoglobin 13.7 13.0 - 18.0 g/dL   HCT 39.7 (L) 40.0 - 52.0 %   MCV 88.5 80.0 - 100.0 fL   MCH 30.6 26.0 - 34.0 pg   MCHC 34.5 32.0 - 36.0 g/dL   RDW 13.1 11.5 - 14.5 %   Platelets 328 150 - 440 K/uL   Neutrophils Relative % 66 %   Neutro Abs 4.0 1.4 - 6.5 K/uL   Lymphocytes Relative 18 %   Lymphs Abs 1.1 1.0 - 3.6 K/uL   Monocytes Relative 16 %   Monocytes Absolute 1.0 0.2 - 1.0 K/uL   Eosinophils Relative 0 %   Eosinophils Absolute 0.0 0 - 0.7 K/uL   Basophils Relative 0 %   Basophils Absolute 0.0 0 - 0.1 K/uL  Urinalysis complete, with microscopic     Status: Abnormal   Collection Time: 09/04/15  7:27 PM  Result Value Ref Range   Color, Urine AMBER (A)  YELLOW   APPearance CLEAR (A) CLEAR   Glucose, UA 50 (A) NEGATIVE mg/dL   Bilirubin Urine NEGATIVE NEGATIVE   Ketones, ur TRACE (A) NEGATIVE mg/dL   Specific Gravity, Urine 1.017 1.005 - 1.030   Hgb urine dipstick NEGATIVE NEGATIVE   pH 5.0 5.0 - 8.0   Protein, ur 100 (A) NEGATIVE mg/dL   Nitrite NEGATIVE NEGATIVE   Leukocytes, UA NEGATIVE NEGATIVE   RBC / HPF NONE SEEN 0 - 5 RBC/hpf   WBC, UA 0-5 0 - 5 WBC/hpf   Bacteria, UA NONE SEEN NONE SEEN   Squamous Epithelial / LPF 0-5 (A) NONE SEEN   Mucous PRESENT    Hyaline Casts, UA PRESENT   Glucose, capillary     Status: Abnormal   Collection Time: 09/05/15  3:58 AM  Result Value Ref Range   Glucose-Capillary 182 (H) 65 - 99 mg/dL   Comment 1 Notify RN   Basic metabolic panel     Status: Abnormal   Collection Time: 09/05/15   4:06 AM  Result Value Ref Range   Sodium 133 (L) 135 - 145 mmol/L   Potassium 5.1 3.5 - 5.1 mmol/L   Chloride 91 (L) 101 - 111 mmol/L   CO2 25 22 - 32 mmol/L   Glucose, Bld 174 (H) 65 - 99 mg/dL   BUN 67 (H) 6 - 20 mg/dL   Creatinine, Ser 3.59 (H) 0.61 - 1.24 mg/dL   Calcium 9.4 8.9 - 10.3 mg/dL   GFR calc non Af Amer 15 (L) >60 mL/min   GFR calc Af Amer 18 (L) >60 mL/min    Comment: (NOTE) The eGFR has been calculated using the CKD EPI equation. This calculation has not been validated in all clinical situations. eGFR's persistently <60 mL/min signify possible Chronic Kidney Disease.    Anion gap 17 (H) 5 - 15  CBC     Status: Abnormal   Collection Time: 09/05/15  4:06 AM  Result Value Ref Range   WBC 5.1 3.8 - 10.6 K/uL   RBC 4.19 (L) 4.40 - 5.90 MIL/uL   Hemoglobin 13.0 13.0 - 18.0 g/dL   HCT 37.1 (L) 40.0 - 52.0 %   MCV 88.6 80.0 - 100.0 fL   MCH 30.9 26.0 - 34.0 pg   MCHC 34.9 32.0 - 36.0 g/dL   RDW 13.3 11.5 - 14.5 %   Platelets 285 150 - 440 K/uL  Glucose, capillary     Status: Abnormal   Collection Time: 09/05/15  9:29 AM  Result Value Ref Range   Glucose-Capillary 163 (H) 65 - 99 mg/dL   Ct Abdomen Pelvis Wo Contrast  09/04/2015  CLINICAL DATA:  Nausea and vomiting. EXAM: CT ABDOMEN AND PELVIS WITHOUT CONTRAST TECHNIQUE: Multidetector CT imaging of the abdomen and pelvis was performed following the standard protocol without IV contrast. COMPARISON:  April 17, 2015 FINDINGS: Coronary artery calcifications are identified. Lung bases are otherwise normal. No free air or free fluid. There is a small bowel obstruction. There may be more than 1 transition point. There is a transition point in the right side of the pelvis on approximately axial image 65. After this point, the small bowel returns of normal caliber. More distally, the small bowel is decompressed extending into the ostomy. The patient is status post colectomy. The patient is status post cholecystectomy. The liver,  spleen, pancreas, and adrenal glands are normal. The kidneys are unchanged with no acute abnormalities. Again noted is aneurysmal dilatation of the distal abdominal aorta  measuring 3.1 cm, unchanged. No adenopathy. The bladder is decompressed but otherwise unremarkable. No masses or adenopathy in the pelvis. The pelvis is stable. Visualized bones are unremarkable. IMPRESSION: 1. Distal small bowel obstruction. 2. 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 Recommend follow-up by ultrasound in 3 years according did ACR consensus guidelines. Electronically Signed   By: Dorise Bullion III M.D   On: 09/04/2015 21:54   Dg Chest Portable 1 View  09/04/2015  CLINICAL DATA:  Weakness. H/o stroke, afib, cardiomyopathy, and coronary artery disease. EXAM: PORTABLE CHEST 1 VIEW COMPARISON:  07/03/2013 FINDINGS: Cardiac silhouette is normal in size. No mediastinal or hilar masses or evidence of adenopathy. Clear lungs. Relative lucency in the upper lobes suggests emphysema. No pleural effusion or pneumothorax. Bony thorax is intact. IMPRESSION: No active disease. Electronically Signed   By: Lajean Manes M.D.   On: 09/04/2015 19:47   Dg Abd Portable 1v  09/05/2015  CLINICAL DATA:  NG tube placement adjustment. EXAM: PORTABLE ABDOMEN - 1 VIEW COMPARISON:  Yesterday at 2314 hour FINDINGS: The enteric tube has been retracted. In addition to the side port, the tip is also now in the distal esophagus. Persistent small bowel dilatation in the upper abdomen. IMPRESSION: Enteric tube is been retracted, with tip and side port are now in the distal esophagus. Advancement of at least 8 cm would result in optimal placement. Electronically Signed   By: Jeb Levering M.D.   On: 09/05/2015 02:51   Dg Abd Portable 1v  09/04/2015  CLINICAL DATA:  NG tube placement. EXAM: PORTABLE ABDOMEN - 1 VIEW COMPARISON:  CT earlier this day. FINDINGS: Tip of the enteric tube below the diaphragm in the stomach, the side port is just proximal  to the gastroesophageal junction. Recommend advancement of at least 4 cm for optimal placement. Dilated small bowel loops in the included upper abdomen. IMPRESSION: Tip of the enteric tube in the stomach, side-port in the distal esophagus. Recommend advancement of at least 4 cm for optimal placement. Electronically Signed   By: Jeb Levering M.D.   On: 09/04/2015 23:46    Assessment/Plan:  Labs and CT scan are personally reviewed as are her abdominal films.  Currently the patient has no nasogastric tube and a CT scan suggested is small bowel obstruction with possibly 2 separate areas of obstruction. His bowels are fairly dilated. I am not clear as to the patient's true condition here as he does not answer questions very well but states that he is not vomited in 2 weeks and has no abdominal pain and his pain is in his back and shoulders. He also states that is postprandial. I'm have a very difficult time understanding this patient's condition and I will order flat upright KUB this morning for repeating films that were done last night. He does have good ostomy output and is not vomited while in the hospital and he had pulled out his nasogastric tube and refuses to have it replaced. I will try to get a better history from the family if they returned today.  Well over an hour was spent with this patient in direct patient contact and care and reviewing studies.  Florene Glen, MD, FACS

## 2015-09-05 NOTE — Progress Notes (Signed)
Attempted to place ng tube. However patient became verbally abusive. Spoke with dr. Benjie Karvonen to make aware per dr cooper, patient needs ng tube for small bowel obstructions. Also made md aware fluids are do to stop once bag complete per md continue ns at 50 ml continuous.

## 2015-09-05 NOTE — Progress Notes (Signed)
Patient removed NG tube. Nurse proceeded to place another NG tube. Patient became upset and agitated. Patient yelled stop and nudge nurse hands away from him. Nurse gave patient a break. Nurse explained that another nurse was going to attempt to insert NG tube. The second nurse was unsuccessful, because patient refused NG tube. Nurse educated patient about the importance of having the NG tube placed. Patient still refused, and stated that he was sorry, and that he could not proceed with it. Dr. Marcille Blanco notified. No new order at this time.

## 2015-09-05 NOTE — Plan of Care (Signed)
Problem: Education: Goal: Knowledge of [facility] Education information/materials will improve Outcome: Not Progressing Patient refused NG placement, after he removed. Educated patient about the importance of inserting NG tube.

## 2015-09-05 NOTE — Progress Notes (Signed)
Patient just had nasogastric tube placed with IV Ativan. He currently is somnolent with vital signs are stable. NG is in place. History was reviewed with the family members. Long discussion with family members multiple, just small bowel obstruction and etiologies etc. and the plan for nasogastric tube and the necessity for nasogastric tube followed by serial KUBs and exams to resolution or to a decision for surgery. This was discussed with them they understood and agreed to proceed multiple questions were answered.

## 2015-09-05 NOTE — Progress Notes (Signed)
[facility] NAME: [name]    MR#:  [id number]  DATE OF BIRTH:  05-01-1940  SUBJECTIVE:   Pulled his NG tube twice last night. Continues have abdominal pain and distention.  REVIEW OF SYSTEMS:    Review of Systems  Unable to perform ROS: dementia    Tolerating Diet:Nothing by mouth      DRUG ALLERGIES:   Allergies  Allergen Reactions  . Fentanyl Other (See Comments)    Delirium  . Hydromorphone Hcl Other (See Comments)    Delirium  . Lorazepam Other (See Comments)    Delirium  . Oxycodone-Acetaminophen Other (See Comments)    Delirium  . Lisinopril   . Vicodin [Hydrocodone-Acetaminophen]     VITALS:  Blood pressure 134/59, pulse 69, temperature 97.8 F (36.6 C), temperature source Oral, resp. rate 17, height 5\' 10"  (1.778 m), weight 107.729 kg (237 lb 8 oz), SpO2 93 %.  PHYSICAL EXAMINATION:   Physical Exam  Constitutional: He is well-developed, well-nourished, and in no distress. No distress.  HENT:  Head: Normocephalic.  Eyes: No scleral icterus.  Neck: Normal range of motion. Neck supple. No JVD present. No tracheal deviation present.  Cardiovascular: Normal rate, regular rhythm and normal heart sounds.  Exam reveals no gallop and no friction rub.   No murmur heard. Pulmonary/Chest: Effort normal and breath sounds normal. No respiratory distress. He has no wheezes. He has no rales. He exhibits no tenderness.  Abdominal: Soft. Bowel sounds are normal. He exhibits no mass. There is tenderness. There is no rebound and no guarding.  Ileostomy  Musculoskeletal: Normal range of motion. He exhibits no edema.  Neurological: He is alert.  Skin: Skin is warm. No rash noted. No erythema.  Psychiatric: Affect and judgment normal.      LABORATORY PANEL:   CBC  Recent Labs Lab 09/05/15 0406  WBC 5.1  HGB 13.0  HCT 37.1*  PLT 285    ------------------------------------------------------------------------------------------------------------------  Chemistries   Recent Labs Lab 09/04/15 1910 09/05/15 0406  NA 129* 133*  K 4.5 5.1  CL 91* 91*  CO2 23 25  GLUCOSE 234* 174*  BUN 63* 67*  CREATININE 3.75* 3.59*  CALCIUM 9.4 9.4  AST 59*  --   ALT 54  --   ALKPHOS 55  --   BILITOT 1.6*  --    ------------------------------------------------------------------------------------------------------------------  Cardiac Enzymes  Recent Labs Lab 09/04/15 1910  TROPONINI <0.03   ------------------------------------------------------------------------------------------------------------------  RADIOLOGY:  Ct Abdomen Pelvis Wo Contrast  09/04/2015  CLINICAL DATA:  Nausea and vomiting. EXAM: CT ABDOMEN AND PELVIS WITHOUT CONTRAST TECHNIQUE: Multidetector CT imaging of the abdomen and pelvis was performed following the standard protocol without IV contrast. COMPARISON:  April 17, 2015 FINDINGS: Coronary artery calcifications are identified. Lung bases are otherwise normal. No free air or free fluid. There is a small bowel obstruction. There may be more than 1 transition point. There is a transition point in the right side of the pelvis on approximately axial image 65. After this point, the small bowel returns of normal caliber. More distally, the small bowel is decompressed extending into the ostomy. The patient is status post colectomy. The patient is status post cholecystectomy. The liver, spleen, pancreas, and adrenal glands are normal. The kidneys are unchanged with no acute abnormalities. 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 No adenopathy. The bladder is decompressed but otherwise unremarkable. No masses or adenopathy in the  pelvis. The pelvis is stable. Visualized bones are unremarkable. IMPRESSION: 1. Distal small bowel obstruction. 2. 3.1 cm aneurysmal  dilatation of the distal aorta. Recommend follow-up by ultrasound in 3 years according did ACR consensus guidelines. Electronically Signed   By: Dorise Bullion III M.D   On: 09/04/2015 21:54   Dg Chest Portable 1 View  09/04/2015  CLINICAL DATA:  Weakness. H/o stroke, afib, cardiomyopathy, and coronary artery disease. EXAM: PORTABLE CHEST 1 VIEW COMPARISON:  07/03/2013 FINDINGS: Cardiac silhouette is normal in size. No mediastinal or hilar masses or evidence of adenopathy. Clear lungs. Relative lucency in the upper lobes suggests emphysema. No pleural effusion or pneumothorax. Bony thorax is intact. IMPRESSION: No active disease. Electronically Signed   By: Lajean Manes M.D.   On: 09/04/2015 19:47   Dg Abd 2 Views  09/05/2015  CLINICAL DATA:  Evaluate for small bowel obstruction. Nausea, vomiting and diarrhea for 3 days. EXAM: ABDOMEN - 2 VIEW COMPARISON:  09/05/2015.  09/04/2015. FINDINGS: No evidence for free air on the upright view. Increased dilatation of small bowel loops. Largest small bowel loop measures up to 7 cm. Cannot exclude wall thickening involving a bowel loop in the left upper quadrant. Large phleboliths in the pelvis. Surgical clips in the abdomen. IMPRESSION: Increased small bowel dilatation. Findings are concerning for worsening small bowel obstruction. These results will be called to the ordering clinician or representative by the Radiologist Assistant, and communication documented in the PACS or zVision Dashboard. Electronically Signed   By: Markus Daft M.D.   On: 09/05/2015 11:58   Dg Abd Portable 1v  09/05/2015  CLINICAL DATA:  NG tube placement adjustment. EXAM: PORTABLE ABDOMEN - 1 VIEW COMPARISON:  Yesterday at 2314 hour FINDINGS: The enteric tube has been retracted. In addition to the side port, the tip is also now in the distal esophagus. Persistent small bowel dilatation in the upper abdomen. IMPRESSION: Enteric tube is been retracted, with tip and side port are now in the distal  esophagus. Advancement of at least 8 cm would result in optimal placement. Electronically Signed   By: Jeb Levering M.D.   On: 09/05/2015 02:51   Dg Abd Portable 1v  09/04/2015  CLINICAL DATA:  NG tube placement. EXAM: PORTABLE ABDOMEN - 1 VIEW COMPARISON:  CT earlier this day. FINDINGS: Tip of the enteric tube below the diaphragm in the stomach, the side port is just proximal to the gastroesophageal junction. Recommend advancement of at least 4 cm for optimal placement. Dilated small bowel loops in the included upper abdomen. IMPRESSION: Tip of the enteric tube in the stomach, side-port in the distal esophagus. Recommend advancement of at least 4 cm for optimal placement. Electronically Signed   By: Jeb Levering M.D.   On: 09/04/2015 23:46     ASSESSMENT AND PLAN:    75 year old male with a history of dementia who presents with diarrhea and found to have distal small bowel physician.  1. Distal small bowel obstruction: Follow-up on surgical recommendations. Follow-up on today's KUB. Patient has pulled his NG tube twice. Continue supportive care  2. Diabetes: Continue sliding scale insulin  3. Essential hypertension: Patient is tolerating diltiazem and metoprolol.  4. Dementia: Continue Aricept.  5. Depression: Continue Paxil  6. Acute kidney injury on chronic kidney disease stage IV: Baseline creatinine is 2.5. Continue IV fluids and repeat BMP in a.m. Appreciate nephrology consultation.  7. Hyponatremia: Improved with IV fluids and continue to follow.  Management plans discussed with the patient's wife  and she is in agreement.  CODE STATUS: full  TOTAL TIME TAKING CARE OF THIS PATIENT: 30 minutes.     POSSIBLE D/C 2-3 days, DEPENDING ON CLINICAL CONDITION.   Ringo Sherod M.D on 09/05/2015 at 12:11 PM  Between 7am to 6pm - Pager - [phone] After 6pm go to [email or web address] - password [email or web address] [facility]  Office  [phone]  CC: Primary care  physician; [name], MD  Note: This dictation was prepared with Dragon dictation along with smaller phrase technology. Any transcriptional errors that result from this process are unintentional.

## 2015-09-05 NOTE — Progress Notes (Signed)
Attempted to call dr. Burt Knack to make aware of abdominal xray. ascom busy will attempt to call again to make aware of results

## 2015-09-05 NOTE — Consult Note (Signed)
[facility]  CONSULT NOTE    Date: 09/05/2015                  Patient Name:  [name]  MRN: [id number]  DOB: 1941/01/27  Age / Sex: 75 y.o., male         PCP: [name], MD                 Service Requesting Consult: Dr. Jannifer Franklin                 Reason for Consult: Acute renal failure on chronic kidney disease stage IV            History of Present Illness: [name] is a 75 y.o. white male with vascular dementia, COPD, hypertension, anemia, hyperlipidemia, insulin dependent diabetes mellitus, BPH, colon resection with ileostomy, bladder cancer, AAA, CVA, CAD, atrial fibrillation, congestive heart failrue, who was admitted to [facility] on 09/04/2015 for Hyponatremia [E87.1] Small bowel obstruction ([place]) [K56.69] Weak [R53.1] Acute on chronic renal insufficiency ([place]) [N28.9, N18.9]  Wife at bedside. Patient has taken out his NG tube. He is quite confused. Creatinine of 3.59. Placed on IV NS    Medications: Outpatient medications: Prescriptions prior to admission  Medication Sig Dispense Refill Last Dose  . albuterol (PROVENTIL HFA;VENTOLIN HFA) 108 (90 BASE) MCG/ACT inhaler Inhale 2 puffs into the lungs every 6 (six) hours as needed for wheezing or shortness of breath. Reported on 06/18/2015   prn  . aspirin EC 81 MG tablet Take 81 mg by mouth daily.   09/04/2015 at 1200  . diltiazem (CARDIZEM CD) 240 MG 24 hr capsule Take 240 mg by mouth daily.   09/04/2015 at 1200  . donepezil (ARICEPT) 10 MG tablet Take 1 tablet (10 mg total) by mouth at bedtime. 90 tablet 2 09/03/2015 at Unknown time  . fenofibrate micronized (LOFIBRA) 134 MG capsule Take 1 capsule by mouth daily.   09/04/2015 at 1200  . Ferrous Sulfate 28 MG TABS Take 1 tablet by mouth daily.   09/04/2015 at Unknown time  . fluocinonide cream (LIDEX) [id number] % Apply 1 application topically daily as needed.    prn  . fluticasone (FLONASE) 50 MCG/ACT nasal spray Place 1 spray into both nostrils 2  (two) times daily as needed for allergies.    prn  . insulin NPH Human (HUMULIN N) 100 UNIT/ML injection Inject 0.7 mLs (70 Units total) into the skin daily before breakfast. 30 mL 4 09/04/2015 at 1200  . metoprolol tartrate (LOPRESSOR) 25 MG tablet Take 25 mg by mouth 2 (two) times daily.   09/04/2015 at 1200  . Multiple Vitamins-Minerals (CENTRUM SILVER) tablet Take 1 tablet by mouth daily.   09/04/2015 at Unknown time  . Omega-3 Fatty Acids (FISH OIL) 1200 MG CAPS Take 1,200 mg by mouth 2 (two) times daily.   09/04/2015 at 1200  . ondansetron (ZOFRAN) 4 MG tablet Take 4 mg by mouth every 8 (eight) hours as needed.    09/04/2015 at Unknown time  . pantoprazole (PROTONIX) 40 MG tablet Take 40 mg by mouth daily at 12 noon.   09/04/2015 at 1200  . PARoxetine (PAXIL) 40 MG tablet Take 1 tablet by mouth daily.   09/04/2015 at 1200  . rosuvastatin (CRESTOR) 20 MG tablet Take 20 mg by mouth at bedtime.   09/03/2015 at Unknown time    Current medications: Current Facility-Administered Medications  Medication Dose Route Frequency Provider Last Rate Last Dose  .  0.9 %  sodium chloride infusion   Intravenous Continuous Lance Coon, MD 100 mL/hr at 09/05/15 0355    . acetaminophen (TYLENOL) tablet 650 mg  650 mg Oral Q6H PRN Lance Coon, MD       Or  . acetaminophen (TYLENOL) suppository 650 mg  650 mg Rectal Q6H PRN Lance Coon, MD      . albuterol (PROVENTIL) (2.5 MG/3ML) 0.083% nebulizer solution 3 mL  3 mL Inhalation Q6H PRN Lance Coon, MD      . aspirin EC tablet 81 mg  81 mg Oral Daily Lance Coon, MD   81 mg at 09/05/15 0953  . diltiazem (CARDIZEM CD) 24 hr capsule 240 mg  240 mg Oral Daily Lance Coon, MD   240 mg at 09/05/15 0953  . donepezil (ARICEPT) tablet 10 mg  10 mg Oral QHS Lance Coon, MD      . heparin injection 5,000 Units  5,000 Units Subcutaneous Q8H Lance Coon, MD   5,000 Units at 09/05/15 0600  . insulin aspart (novoLOG) injection 0-9 Units  0-9 Units Subcutaneous Q6H Lance Coon, MD    2 Units at 09/05/15 [phone]  . metoprolol tartrate (LOPRESSOR) tablet 25 mg  25 mg Oral BID Lance Coon, MD   25 mg at 09/05/15 0953  . ondansetron (ZOFRAN) tablet 4 mg  4 mg Oral Q6H PRN Lance Coon, MD       Or  . ondansetron [facility]) injection 4 mg  4 mg Intravenous Q6H PRN Lance Coon, MD      . pantoprazole (PROTONIX) EC tablet 40 mg  40 mg Oral Q1200 Lance Coon, MD      . PARoxetine (PAXIL) tablet 40 mg  40 mg Oral Daily Lance Coon, MD   40 mg at 09/05/15 0953  . rosuvastatin (CRESTOR) tablet 20 mg  20 mg Oral QHS Lance Coon, MD          Allergies: Allergies  Allergen Reactions  . Fentanyl Other (See Comments)    Delirium  . Hydromorphone Hcl Other (See Comments)    Delirium  . Lorazepam Other (See Comments)    Delirium  . Oxycodone-Acetaminophen Other (See Comments)    Delirium  . Lisinopril   . Vicodin [Hydrocodone-Acetaminophen]       Past Medical History: Past Medical History  Diagnosis Date  . Gout   . Depression   . Diabetes mellitus   . Unspecified essential hypertension   . Hypercholesteremia     IIA  . Colitis, ulcerative ([place])     NOS  . Personal hx-rectal/anal malignancy   . Urinary incontinence   . Benign prostatic hypertrophy     S/p TURP   . H/O ETOH abuse 06/06/2011  . Uncontrolled secondary diabetes mellitus with stage 3 CKD (GFR 30-59) ([place]) 06/05/2011  . Pleurisy   . AAA (abdominal aortic aneurysm) ([place]) 1992    S/p repair and grafting  . History of alcohol abuse   . CAD (coronary artery disease)   . Stroke [facility])     75 yrs old  . Stones in the urinary tract   . GERD (gastroesophageal reflux disease)   . Normocytic anemia     Chronic  . PAF (paroxysmal atrial fibrillation) ([place])     during hospitalization 06/2011 for sepsis  . Cardiomyopathy, ischemic     EF 40-45% by echo 06/2011  . Bladder cancer ([place]) 2006  . 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 Colon cancer ([place])   .  Chronic bronchitis ([place])     "used to get it  quite often; found out medicine was causing it"  . Arthritis     "left knee"  . Memory disorder 04/23/2013  . Obesity   . Dementia      Past Surgical History: Past Surgical History  Procedure Laterality Date  . Transurethral resection of prostate    . Abdominal aortic aneurysm repair  1992    "had 3 blockages; 3 aneurysms repaired at the same time"  . I&d knee with poly exchange  06/12/2011    Procedure: IRRIGATION AND DEBRIDEMENT KNEE WITH POLY EXCHANGE;  Surgeon: Yvette Rack., MD;  Location: [facility];  Service: Orthopedics;  Laterality: Left;  . Appendectomy    . Eye surgery    . Coronary angioplasty      1992  . Colonoscopy w/ polypectomy    . Cataract extraction w/ intraocular lens  implant, bilateral  ~ 2010  . Total knee arthroplasty  1990's    left  . Joint replacement      left knee  . Total knee revision  10/26/11    left  . Total knee revision  10/26/2011    Procedure: TOTAL KNEE REVISION;  Surgeon: Garald Balding, MD;  Location: [place];  Service: Orthopedics;  Laterality: Left;  left total knee revision  . Angioplasty    . Bladder tumor excision  2002  . Compound fracture ankle  08/17/2003     Family History: Family History  Problem Relation Age of Onset  . Dementia Mother   . COPD Father   . Anesthesia problems Neg Hx   . Dementia Sister   . Cancer Brother   . Cancer Brother   . Dementia Brother   . Diabetes Neg Hx      Social History: Social History   Social History  . Marital Status: Married    Spouse Name: N/A  . Number of Children: 3  . Years of Education: hs   Occupational History  . Retired    Social History Main Topics  . Smoking status: Former Smoker -- 2.00 packs/day for 12 years    Types: Cigarettes    Quit date: 06/04/1970  . Smokeless tobacco: Current User    Types: Chew  . Alcohol Use: No     Comment: Occasional  . Drug Use: No  . Sexual Activity: Not Currently    Birth Control/ Protection: None   Other Topics Concern   . Not on file   Social History Narrative   Married. Patient lives at home with his wife.   Patient is right handed.   Patient drinks 3 cups of caffeine per day.     Review of Systems: Review of Systems  Unable to perform ROS: dementia    Vital Signs: Blood pressure 134/59, pulse 69, temperature 97.8 F (36.6 C), temperature source Oral, resp. rate 17, height 5\' 10"  (1.778 m), weight 107.729 kg (237 lb 8 oz), SpO2 93 %.  Weight trends: Filed Weights   09/04/15 1914 09/05/15 0305  Weight: 110.224 kg (243 lb) 107.729 kg (237 lb 8 oz)    Physical Exam: General: NAD, laying in bed  Head: Normocephalic, atraumatic. Moist oral mucosal membranes  Eyes: Anicteric, PERRL  Neck: Supple, trachea midline  Lungs:  Clear to auscultation  Heart: Regular rate and rhythm  Abdomen:  Obese, tender, +ileostomy tube  Extremities:  peripheral edema.  Neurologic: Nonfocal, moving all four extremities  Skin: No lesions  Lab results: Basic Metabolic Panel:  Recent Labs Lab 09/04/15 1910 09/05/15 0406  NA 129* 133*  K 4.5 5.1  CL 91* 91*  CO2 23 25  GLUCOSE 234* 174*  BUN 63* 67*  CREATININE 3.75* 3.59*  CALCIUM 9.4 9.4    Liver Function Tests:  Recent Labs Lab 09/04/15 1910  AST 59*  ALT 54  ALKPHOS 55  BILITOT 1.6*  PROT 8.0  ALBUMIN 4.1    Recent Labs Lab 09/04/15 1910  LIPASE 23   No results for input(s): AMMONIA in the last 168 hours.  CBC:  Recent Labs Lab 09/04/15 1910 09/05/15 0406  WBC 6.0 5.1  NEUTROABS 4.0  --   HGB 13.7 13.0  HCT 39.7* 37.1*  MCV 88.5 88.6  PLT 328 285    Cardiac Enzymes:  Recent Labs Lab 09/04/15 1910  TROPONINI <0.03    BNP: Invalid input(s): POCBNP  CBG:  Recent Labs Lab 09/05/15 0358 09/05/15 0929 09/05/15 1108  GLUCAP 182* 163* 156*    Microbiology: Results for orders placed or performed during the hospital encounter of 10/26/11  Tissue culture     Status: None   Collection Time: 10/26/11   8:19 AM  Result Value Ref Range Status   Specimen Description TISSUE KNEE LEFT  Final   Special Requests SYNOVIUM LEFT KNEE NO 1  Final   Gram Stain   Final    NO WBC SEEN NO SQUAMOUS EPITHELIAL CELLS SEEN NO ORGANISMS SEEN   Culture NO GROWTH 3 DAYS  Final   Report Status 10/29/2011 FINAL  Final  Anaerobic culture     Status: None   Collection Time: 10/26/11  8:19 AM  Result Value Ref Range Status   Specimen Description TISSUE KNEE LEFT  Final   Special Requests SYNOVIUM LEFT KNEE NO 1  Final   Gram Stain NO WBC SEEN NO ORGANISMS SEEN  Final   Culture NO ANAEROBES ISOLATED  Final   Report Status 10/29/2011 FINAL  Final  Body fluid culture     Status: None   Collection Time: 10/26/11  8:22 AM  Result Value Ref Range Status   Specimen Description SYNOVIAL FLUID LEFT KNEE  Final   Special Requests FLUID ON SWAB SAMPLE NO 2  Final   Gram Stain NO WBC SEEN NO ORGANISMS SEEN  Final   Culture NO GROWTH 3 DAYS  Final   Report Status 10/29/2011 FINAL  Final  Anaerobic culture     Status: None   Collection Time: 10/26/11  8:22 AM  Result Value Ref Range Status   Specimen Description SYNOVIAL FLUID LEFT KNEE  Final   Special Requests FLUID ON SWAB SAMPLE NO 2  Final   Gram Stain NO WBC SEEN NO ORGANISMS SEEN  Final   Culture NO ANAEROBES ISOLATED  Final   Report Status 10/31/2011 FINAL  Final  Anaerobic culture     Status: None   Collection Time: 10/26/11  8:23 AM  Result Value Ref Range Status   Specimen Description TISSUE KNEE LEFT  Final   Special Requests TIBIAL TISSUE LEFT KNEE SAMPLE NO 3  Final   Gram Stain NO WBC SEEN NO ORGANISMS SEEN  Final   Culture NO ANAEROBES ISOLATED  Final   Report Status 10/29/2011 FINAL  Final  Tissue culture     Status: None   Collection Time: 10/26/11  8:23 AM  Result Value Ref Range Status   Specimen Description TISSUE KNEE LEFT  Final   Special Requests TIBIAL TISSUE  LEFT KNEE SAMPLE NO 3  Final   Gram Stain   Final    NO WBC SEEN RARE  SQUAMOUS EPITHELIAL CELLS PRESENT NO ORGANISMS SEEN   Culture NO GROWTH 3 DAYS  Final   Report Status 10/29/2011 FINAL  Final    Coagulation Studies: No results for input(s): LABPROT, INR in the last 72 hours.  Urinalysis:  Recent Labs  09/04/15 1927  COLORURINE AMBER*  LABSPEC 1.017  PHURINE 5.0  GLUCOSEU 50*  HGBUR NEGATIVE  BILIRUBINUR NEGATIVE  KETONESUR TRACE*  PROTEINUR 100*  NITRITE NEGATIVE  LEUKOCYTESUR NEGATIVE      Imaging: Ct Abdomen Pelvis Wo Contrast  09/04/2015  CLINICAL DATA:  Nausea and vomiting. EXAM: CT ABDOMEN AND PELVIS WITHOUT CONTRAST TECHNIQUE: Multidetector CT imaging of the abdomen and pelvis was performed following the standard protocol without IV contrast. COMPARISON:  April 17, 2015 FINDINGS: Coronary artery calcifications are identified. Lung bases are otherwise normal. No free air or free fluid. There is a small bowel obstruction. There may be more than 1 transition point. There is a transition point in the right side of the pelvis on approximately axial image 65. After this point, the small bowel returns of normal caliber. More distally, the small bowel is decompressed extending into the ostomy. The patient is status post colectomy. The patient is status post cholecystectomy. The liver, spleen, pancreas, and adrenal glands are normal. The kidneys are unchanged with no acute abnormalities. 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 No adenopathy. The bladder is decompressed but otherwise unremarkable. No masses or adenopathy in the pelvis. The pelvis is stable. Visualized bones are unremarkable. IMPRESSION: 1. Distal small bowel obstruction. 2. 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 Recommend follow-up by ultrasound in 3 years according did ACR consensus guidelines. Electronically Signed   By: Dorise Bullion III M.D   On: 09/04/2015 21:54   Dg Chest Portable 1 View  09/04/2015  CLINICAL  DATA:  Weakness. H/o stroke, afib, cardiomyopathy, and coronary artery disease. EXAM: PORTABLE CHEST 1 VIEW COMPARISON:  07/03/2013 FINDINGS: Cardiac silhouette is normal in size. No mediastinal or hilar masses or evidence of adenopathy. Clear lungs. Relative lucency in the upper lobes suggests emphysema. No pleural effusion or pneumothorax. Bony thorax is intact. IMPRESSION: No active disease. Electronically Signed   By: Lajean Manes M.D.   On: 09/04/2015 19:47   Dg Abd Portable 1v  09/05/2015  CLINICAL DATA:  NG tube placement adjustment. EXAM: PORTABLE ABDOMEN - 1 VIEW COMPARISON:  Yesterday at 2314 hour FINDINGS: The enteric tube has been retracted. In addition to the side port, the tip is also now in the distal esophagus. Persistent small bowel dilatation in the upper abdomen. IMPRESSION: Enteric tube is been retracted, with tip and side port are now in the distal esophagus. Advancement of at least 8 cm would result in optimal placement. Electronically Signed   By: Jeb Levering M.D.   On: 09/05/2015 02:51   Dg Abd Portable 1v  09/04/2015  CLINICAL DATA:  NG tube placement. EXAM: PORTABLE ABDOMEN - 1 VIEW COMPARISON:  CT earlier this day. FINDINGS: Tip of the enteric tube below the diaphragm in the stomach, the side port is just proximal to the gastroesophageal junction. Recommend advancement of at least 4 cm for optimal placement. Dilated small bowel loops in the included upper abdomen. IMPRESSION: Tip of the enteric tube in the stomach, side-port in the distal esophagus. Recommend advancement of at least 4  cm for optimal placement. Electronically Signed   By: Jeb Levering M.D.   On: 09/04/2015 23:46      Assessment & Plan: Mr. ALEXUS KACZANOWSKI is a 75 y.o. white male with vascular dementia, COPD, hypertension, anemia, hyperlipidemia, insulin dependent diabetes mellitus, BPH, colon resection with ileostomy, bladder cancer, AAA, CVA, CAD, atrial fibrillation, congestive heart failrue, who was  admitted to [facility] on 09/04/2015.   1. Acute renal failure on chronic kidney disease stage IV with proteinuria: Chronic kidney disease stage IV secondary to diabetes, vascular disease and hypertension. Baseline creatinine seems to be ~2.5. Follows with Dr. Florene Glen, [facility] in [place].  Acute renal failure secondary to small bowel obstruction and prerenal azotemia.  - Agree with IV fluids, reduce frequency to 72mL/hr  2. Hyponatremia: improved to 133. Hypovolemic.  - Continue IV NS  3. 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  - Continue glucose control.      LOS: 0 Marc Leichter, [place] 6/2/201711:26 AM

## 2015-09-05 NOTE — Progress Notes (Signed)
Discussed with RN and personally reviewed KUB showing more dilatation to the mall bowel. All suggests a worsening bowel obstruction or progressive bowel obstruction. He is putting cyst succus into his bag however. I reminded the RN that this patient needs a nasogastric tube as previously ordered and that he does not have one she stated that he has refused and pulled it out twice. Relative surgery with a nasogastric tube there is no other option for this patient other than a the placement of a nasogastric tube at this point. I will try to combine talk to family if they're present later on but this patient requires nasogastric decompression at this point where he will require surgery in which case she will need a nasogastric tube as well.

## 2015-09-05 NOTE — Progress Notes (Signed)
Spoke with dr. Burt Knack to make aware of abdominal results. Per md patient needs to have NG tube. Will talk with patient and attempt to replace NG tube

## 2015-09-05 NOTE — Progress Notes (Signed)
Ng tube advanced to 68cm. Unable to advance further, patient uncooperative. Repeat xray to confirm placement

## 2015-09-05 NOTE — H&P (Signed)
[place] [facility] [place] NAME: [name]    MR#:  [id number]  DATE OF BIRTH:  05-01-1940  DATE OF ADMISSION:  09/04/2015  PRIMARY CARE PHYSICIAN: [name], MD   REQUESTING/REFERRING PHYSICIAN: Mariea Clonts, MD  CHIEF COMPLAINT:   Chief Complaint  Patient presents with  . Diarrhea  . Emesis    HISTORY OF PRESENT ILLNESS:  [name]  is a 75 y.o. male who presents with Abdominal pain with nausea and vomiting for the past 5 days. Patient went to his outpatient physician who thought he maybe had viral gastroenteritis. However, his pain persisted and he was brought to the ED for evaluation. Here he was found on imaging to have a small bowel instruction. Surgery was contacted and does not feel that he needs surgical intervention at this time. Hospitals were called for admission.  PAST MEDICAL HISTORY:   Past Medical History  Diagnosis Date  . Gout   . Depression   . Diabetes mellitus   . Unspecified essential hypertension   . Hypercholesteremia     IIA  . Colitis, ulcerative ([place])     NOS  . Personal hx-rectal/anal malignancy   . Urinary incontinence   . Benign prostatic hypertrophy     S/p TURP   . H/O ETOH abuse 06/06/2011  . Uncontrolled secondary diabetes mellitus with stage 3 CKD (GFR 30-59) ([place]) 06/05/2011  . Pleurisy   . AAA (abdominal aortic aneurysm) ([place]) 1992    S/p repair and grafting  . History of alcohol abuse   . CAD (coronary artery disease)   . Stroke [facility])     75 yrs old  . Stones in the urinary tract   . GERD (gastroesophageal reflux disease)   . Normocytic anemia     Chronic  . PAF (paroxysmal atrial fibrillation) ([place])     during hospitalization 06/2011 for sepsis  . Cardiomyopathy, ischemic     EF 40-45% by echo 06/2011  . Bladder cancer ([place]) 2006  . 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 Colon cancer ([place])   . Chronic bronchitis ([place])     "used to get it quite often; found out  medicine was causing it"  . Arthritis     "left knee"  . Memory disorder 04/23/2013  . Obesity   . Dementia     PAST SURGICAL HISTORY:   Past Surgical History  Procedure Laterality Date  . Transurethral resection of prostate    . Abdominal aortic aneurysm repair  1992    "had 3 blockages; 3 aneurysms repaired at the same time"  . I&d knee with poly exchange  06/12/2011    Procedure: IRRIGATION AND DEBRIDEMENT KNEE WITH POLY EXCHANGE;  Surgeon: Yvette Rack., MD;  Location: [facility];  Service: Orthopedics;  Laterality: Left;  . Appendectomy    . Eye surgery    . Coronary angioplasty      1992  . Colonoscopy w/ polypectomy    . Cataract extraction w/ intraocular lens  implant, bilateral  ~ 2010  . Total knee arthroplasty  1990's    left  . Joint replacement      left knee  . Total knee revision  10/26/11    left  . Total knee revision  10/26/2011    Procedure: TOTAL KNEE REVISION;  Surgeon: Garald Balding, MD;  Location: [facility];  Service: Orthopedics;  Laterality: Left;  left total knee revision  . Angioplasty    .  Bladder tumor excision  2002  . Compound fracture ankle  08/17/2003    SOCIAL HISTORY:   Social History  Substance Use Topics  . Smoking status: Former Smoker -- 2.00 packs/day for 12 years    Types: Cigarettes    Quit date: 06/04/1970  . Smokeless tobacco: Current User    Types: Chew  . Alcohol Use: No     Comment: Occasional    FAMILY HISTORY:   Family History  Problem Relation Age of Onset  . Dementia Mother   . COPD Father   . Anesthesia problems Neg Hx   . Dementia Sister   . Cancer Brother   . Cancer Brother   . Dementia Brother   . Diabetes Neg Hx     DRUG ALLERGIES:   Allergies  Allergen Reactions  . Fentanyl Other (See Comments)    Delirium  . Hydromorphone Hcl Other (See Comments)    Delirium  . Lorazepam Other (See Comments)    Delirium  . Oxycodone-Acetaminophen Other (See Comments)    Delirium  . Lisinopril   . Vicodin  [Hydrocodone-Acetaminophen]     MEDICATIONS AT HOME:   Prior to Admission medications   Medication Sig Start Date End Date Taking? Authorizing Provider  albuterol (PROVENTIL HFA;VENTOLIN HFA) 108 (90 BASE) MCG/ACT inhaler Inhale 2 puffs into the lungs every 6 (six) hours as needed for wheezing or shortness of breath. Reported on 06/18/2015   Yes Historical Provider, MD  aspirin EC 81 MG tablet Take 81 mg by mouth daily.   Yes Historical Provider, MD  diltiazem (CARDIZEM CD) 240 MG 24 hr capsule Take 240 mg by mouth daily. 04/08/13  Yes Historical Provider, MD  donepezil (ARICEPT) 10 MG tablet Take 1 tablet (10 mg total) by mouth at bedtime. 01/30/15  Yes Kathrynn Ducking, MD  fenofibrate micronized (LOFIBRA) 134 MG capsule Take 1 capsule by mouth daily. 09/03/15  Yes Historical Provider, MD  Ferrous Sulfate 28 MG TABS Take 1 tablet by mouth daily.   Yes Historical Provider, MD  fluocinonide cream (LIDEX) [id number] % Apply 1 application topically daily as needed.  08/14/15  Yes Historical Provider, MD  fluticasone (FLONASE) 50 MCG/ACT nasal spray Place 1 spray into both nostrils 2 (two) times daily as needed for allergies.  08/22/15  Yes Historical Provider, MD  insulin NPH Human (HUMULIN N) 100 UNIT/ML injection Inject 0.7 mLs (70 Units total) into the skin daily before breakfast. 06/24/15  Yes Renato Shin, MD  metoprolol tartrate (LOPRESSOR) 25 MG tablet Take 25 mg by mouth 2 (two) times daily. 06/24/11  Yes Radene Gunning, NP  Multiple Vitamins-Minerals (CENTRUM SILVER) tablet Take 1 tablet by mouth daily.   Yes Historical Provider, MD  Omega-3 Fatty Acids (FISH OIL) 1200 MG CAPS Take 1,200 mg by mouth 2 (two) times daily.   Yes Historical Provider, MD  ondansetron (ZOFRAN) 4 MG tablet Take 4 mg by mouth every 8 (eight) hours as needed.  09/03/15  Yes Historical Provider, MD  pantoprazole (PROTONIX) 40 MG tablet Take 40 mg by mouth daily at 12 noon. 06/24/11  Yes Lezlie Octave Black, NP  PARoxetine (PAXIL) 40 MG  tablet Take 1 tablet by mouth daily. 07/14/15  Yes Historical Provider, MD  rosuvastatin (CRESTOR) 20 MG tablet Take 20 mg by mouth at bedtime.   Yes Historical Provider, MD    REVIEW OF SYSTEMS:  Review of Systems  Constitutional: Negative for fever, chills, weight loss and malaise/fatigue.  HENT: Negative for ear pain, hearing  loss and tinnitus.   Eyes: Negative for blurred vision, double vision, pain and redness.  Respiratory: Negative for cough, hemoptysis and shortness of breath.   Cardiovascular: Negative for chest pain, palpitations, orthopnea and leg swelling.  Gastrointestinal: Positive for nausea, vomiting and abdominal pain. Negative for diarrhea and constipation.  Genitourinary: Negative for dysuria, frequency and hematuria.  Musculoskeletal: Negative for back pain, joint pain and neck pain.  Skin:       No acne, rash, or lesions  Neurological: Negative for dizziness, tremors, focal weakness and weakness.  Endo/Heme/Allergies: Negative for polydipsia. Does not bruise/bleed easily.  Psychiatric/Behavioral: Negative for depression. The patient is not nervous/anxious and does not have insomnia.      VITAL SIGNS:   Filed Vitals:   09/04/15 2200 09/04/15 2230 09/04/15 2300 09/04/15 2330  BP: 144/83 137/82 148/79 144/79  Pulse: 75  76 77  Temp:      TempSrc:      Resp: 16 17 15 17   Height:      Weight:      SpO2: 92%  92% 94%   Wt Readings from Last 3 Encounters:  09/04/15 110.224 kg (243 lb)  07/22/15 111.131 kg (245 lb)  06/18/15 110.224 kg (243 lb)    PHYSICAL EXAMINATION:  Physical Exam  Vitals reviewed. Constitutional: He appears well-developed and well-nourished. No distress.  HENT:  Head: Normocephalic and atraumatic.  Mouth/Throat: Oropharynx is clear and moist.  Eyes: Conjunctivae and EOM are normal. Pupils are equal, round, and reactive to light. No scleral icterus.  Neck: Normal range of motion. Neck supple. No JVD present. No thyromegaly present.   Cardiovascular: Normal rate, regular rhythm and intact distal pulses.  Exam reveals no gallop and no friction rub.   No murmur heard. Respiratory: Effort normal and breath sounds normal. No respiratory distress. He has no wheezes. He has no rales.  GI: Soft. He exhibits distension. There is tenderness.  Significantly slowed bowel sounds  Musculoskeletal: Normal range of motion. He exhibits no edema.  No arthritis, no gout  Lymphadenopathy:    He has no cervical adenopathy.  Neurological: He is alert. No cranial nerve deficit.  No dysarthria, no aphasia  Skin: Skin is warm and dry. No rash noted. No erythema.  Psychiatric: He has a normal mood and affect. His behavior is normal. Judgment and thought content normal.    LABORATORY PANEL:   CBC  Recent Labs Lab 09/04/15 1910  WBC 6.0  HGB 13.7  HCT 39.7*  PLT 328   ------------------------------------------------------------------------------------------------------------------  Chemistries   Recent Labs Lab 09/04/15 1910  NA 129*  K 4.5  CL 91*  CO2 23  GLUCOSE 234*  BUN 63*  CREATININE 3.75*  CALCIUM 9.4  AST 59*  ALT 54  ALKPHOS 55  BILITOT 1.6*   ------------------------------------------------------------------------------------------------------------------  Cardiac Enzymes  Recent Labs Lab 09/04/15 1910  TROPONINI <0.03   ------------------------------------------------------------------------------------------------------------------  RADIOLOGY:  Ct Abdomen Pelvis Wo Contrast  09/04/2015  CLINICAL DATA:  Nausea and vomiting. EXAM: CT ABDOMEN AND PELVIS WITHOUT CONTRAST TECHNIQUE: Multidetector CT imaging of the abdomen and pelvis was performed following the standard protocol without IV contrast. COMPARISON:  April 17, 2015 FINDINGS: Coronary artery calcifications are identified. Lung bases are otherwise normal. No free air or free fluid. There is a small bowel obstruction. There may be more than 1  transition point. There is a transition point in the right side of the pelvis on approximately axial image 65. After this point, the small bowel returns of normal  caliber. More distally, the small bowel is decompressed extending into the ostomy. The patient is status post colectomy. The patient is status post cholecystectomy. The liver, spleen, pancreas, and adrenal glands are normal. The kidneys are unchanged with no acute abnormalities. 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 No adenopathy. The bladder is decompressed but otherwise unremarkable. No masses or adenopathy in the pelvis. The pelvis is stable. Visualized bones are unremarkable. IMPRESSION: 1. Distal small bowel obstruction. 2. 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 Recommend follow-up by ultrasound in 3 years according did ACR consensus guidelines. Electronically Signed   By: Dorise Bullion III M.D   On: 09/04/2015 21:54   Dg Chest Portable 1 View  09/04/2015  CLINICAL DATA:  Weakness. H/o stroke, afib, cardiomyopathy, and coronary artery disease. EXAM: PORTABLE CHEST 1 VIEW COMPARISON:  07/03/2013 FINDINGS: Cardiac silhouette is normal in size. No mediastinal or hilar masses or evidence of adenopathy. Clear lungs. Relative lucency in the upper lobes suggests emphysema. No pleural effusion or pneumothorax. Bony thorax is intact. IMPRESSION: No active disease. Electronically Signed   By: Lajean Manes M.D.   On: 09/04/2015 19:47   Dg Abd Portable 1v  09/04/2015  CLINICAL DATA:  NG tube placement. EXAM: PORTABLE ABDOMEN - 1 VIEW COMPARISON:  CT earlier this day. FINDINGS: Tip of the enteric tube below the diaphragm in the stomach, the side port is just proximal to the gastroesophageal junction. Recommend advancement of at least 4 cm for optimal placement. Dilated small bowel loops in the included upper abdomen. IMPRESSION: Tip of the enteric tube in the stomach, side-port in the  distal esophagus. Recommend advancement of at least 4 cm for optimal placement. Electronically Signed   By: Jeb Levering M.D.   On: 09/04/2015 23:46    EKG:   Orders placed or performed during the hospital encounter of 09/04/15  . ED EKG  . ED EKG  . EKG 12-Lead  . EKG 12-Lead    IMPRESSION AND PLAN:  Principal Problem:   SBO (small bowel obstruction) (HCC) - NG tube placed in the ED to low wall suction. We'll consult surgery for them to follow along with this. Patient will be nothing by mouth for now except for sips with meds with NG tube in place for bowel rest and bowel decompression. Active Problems:   Essential hypertension - currently stable, continue home meds   Coronary atherosclerosis - continue home meds   Atrial flutter, paroxysmal (HCC) - continue home rate controlling medications   Diabetes (HCC) - sliding scale insulin with corresponding glucose checks every 6 hours for now while he is nothing by mouth   BPH (benign prostatic hyperplasia) - continue home meds   GERD (gastroesophageal reflux disease) - home dose PPI  All the records are reviewed and case discussed with ED provider. Management plans discussed with the patient and/or family.  DVT PROPHYLAXIS: SubQ lovenox  GI PROPHYLAXIS: PPI  ADMISSION STATUS: Inpatient  CODE STATUS: Full Code Status History    Date Active Date Inactive Code Status Order ID Comments User Context   10/26/2011  1:22 PM 10/29/2011  4:50 PM Full Code [id number]  Carron Brazen, RN Inpatient   08/24/2011  6:24 PM 08/27/2011  5:15 PM Full Code [id number]  Roosvelt Harps, RN Inpatient   06/12/2011  4:39 PM 06/24/2011  6:28 PM Full Code [id number]  Derek Jack, RN Inpatient   06/05/2011 12:54 AM 06/08/2011  4:01 PM Full  Code [id number]  Loel Lofty, RN ED      TOTAL TIME TAKING CARE OF THIS PATIENT: 45 minutes.    Asja Frommer Early 09/05/2015, 1:26 AM  [facility]  Office   [phone]  CC: Primary care physician; [name], MD

## 2015-09-06 ENCOUNTER — Inpatient Hospital Stay: Payer: Medicare Other

## 2015-09-06 LAB — CBC
HCT: 39.3 % — ABNORMAL LOW (ref 40.0–52.0)
Hemoglobin: 13.5 g/dL (ref 13.0–18.0)
MCH: 30.6 pg (ref 26.0–34.0)
MCHC: 34.2 g/dL (ref 32.0–36.0)
MCV: 89.4 fL (ref 80.0–100.0)
PLATELETS: 322 10*3/uL (ref 150–440)
RBC: 4.4 MIL/uL (ref 4.40–5.90)
RDW: 13.3 % (ref 11.5–14.5)
WBC: 7.6 10*3/uL (ref 3.8–10.6)

## 2015-09-06 LAB — BASIC METABOLIC PANEL
Anion gap: 18 — ABNORMAL HIGH (ref 5–15)
BUN: 73 mg/dL — AB (ref 6–20)
CHLORIDE: 94 mmol/L — AB (ref 101–111)
CO2: 20 mmol/L — AB (ref 22–32)
CREATININE: 3.11 mg/dL — AB (ref 0.61–1.24)
Calcium: 9.4 mg/dL (ref 8.9–10.3)
GFR calc Af Amer: 21 mL/min — ABNORMAL LOW (ref >60)
GFR calc non Af Amer: 18 mL/min — ABNORMAL LOW (ref >60)
Glucose, Bld: 161 mg/dL — ABNORMAL HIGH (ref 65–99)
Potassium: 4.3 mmol/L (ref 3.5–5.1)
SODIUM: 132 mmol/L — AB (ref 135–145)

## 2015-09-06 LAB — GLUCOSE, CAPILLARY
Glucose-Capillary: 182 mg/dL — ABNORMAL HIGH (ref 65–99)
Glucose-Capillary: 199 mg/dL — ABNORMAL HIGH (ref 65–99)
Glucose-Capillary: 208 mg/dL — ABNORMAL HIGH (ref 65–99)

## 2015-09-06 MED ORDER — INSULIN ASPART 100 UNIT/ML ~~LOC~~ SOLN
0.0000 [IU] | Freq: Four times a day (QID) | SUBCUTANEOUS | Status: DC
  Administered 2015-09-06: 3 [IU] via SUBCUTANEOUS
  Administered 2015-09-06: 2 [IU] via SUBCUTANEOUS
  Administered 2015-09-07: 5 [IU] via SUBCUTANEOUS
  Administered 2015-09-07: 2 [IU] via SUBCUTANEOUS
  Administered 2015-09-07 – 2015-09-08 (×3): 3 [IU] via SUBCUTANEOUS
  Administered 2015-09-08: 5 [IU] via SUBCUTANEOUS
  Administered 2015-09-08 (×2): 3 [IU] via SUBCUTANEOUS
  Administered 2015-09-08 – 2015-09-09 (×3): 5 [IU] via SUBCUTANEOUS
  Filled 2015-09-06 (×4): qty 5
  Filled 2015-09-06: qty 4
  Filled 2015-09-06: qty 2
  Filled 2015-09-06: qty 3
  Filled 2015-09-06: qty 2
  Filled 2015-09-06 (×5): qty 3

## 2015-09-06 MED ORDER — ALPRAZOLAM 0.25 MG PO TABS
0.2500 mg | ORAL_TABLET | Freq: Three times a day (TID) | ORAL | Status: DC
  Administered 2015-09-06: 0.25 mg via ORAL
  Filled 2015-09-06 (×2): qty 1

## 2015-09-06 NOTE — Progress Notes (Signed)
CC: Bowel obstruction Subjective: Patient with a small bowel obstruction no family is present at this time but sitter is present. History and review of systems is not reliable from this patient but he describes no abdominal pain at this point is a gastric tube is clamped at this point but should be on suction  Objective: Vital signs in last 24 hours: Temp:  [97.5 F (36.4 C)-97.8 F (36.6 C)] 97.5 F (36.4 C) (06/03 0554) Pulse Rate:  [69-82] 82 (06/03 0931) Resp:  [17-21] 21 (06/03 0554) BP: (134-148)/(59-64) 148/59 mmHg (06/03 0931) SpO2:  [93 %-95 %] 95 % (06/03 0554) Last BM Date: 09/06/15  Intake/Output from previous day: 06/02 0701 - 06/03 0700 In: 915 [I.V.:915] Out: 2850 [Urine:925; Emesis/NG output:100; Stool:1825] Intake/Output this shift: Total I/O In: -  Out: 200 [Urine:125; Stool:75]  Physical exam:  Awake and alert Abdomen is distended but nontender and nontympanitic Liquid stool in ileostomy bag. Vital signs reviewed and stable.  Nearly 2 L of ileostomy output in the last 24 hours  Lab Results: CBC   Recent Labs  09/05/15 0406 09/06/15 0456  WBC 5.1 7.6  HGB 13.0 13.5  HCT 37.1* 39.3*  PLT 285 322   BMET  Recent Labs  09/05/15 0406 09/06/15 0456  NA 133* 132*  K 5.1 4.3  CL 91* 94*  CO2 25 20*  GLUCOSE 174* 161*  BUN 67* 73*  CREATININE 3.59* 3.11*  CALCIUM 9.4 9.4   PT/INR No results for input(s): LABPROT, INR in the last 72 hours. ABG No results for input(s): PHART, HCO3 in the last 72 hours.  Invalid input(s): PCO2, PO2  Studies/Results: Ct Abdomen Pelvis Wo Contrast  09/04/2015  CLINICAL DATA:  Nausea and vomiting. EXAM: CT ABDOMEN AND PELVIS WITHOUT CONTRAST TECHNIQUE: Multidetector CT imaging of the abdomen and pelvis was performed following the standard protocol without IV contrast. COMPARISON:  April 17, 2015 FINDINGS: Coronary artery calcifications are identified. Lung bases are otherwise normal. No free air or free fluid.  There is a small bowel obstruction. There may be more than 1 transition point. There is a transition point in the right side of the pelvis on approximately axial image 65. After this point, the small bowel returns of normal caliber. More distally, the small bowel is decompressed extending into the ostomy. The patient is status post colectomy. The patient is status post cholecystectomy. The liver, spleen, pancreas, and adrenal glands are normal. The kidneys are unchanged with no acute abnormalities. 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 No adenopathy. The bladder is decompressed but otherwise unremarkable. No masses or adenopathy in the pelvis. The pelvis is stable. Visualized bones are unremarkable. IMPRESSION: 1. Distal small bowel obstruction. 2. 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 Recommend follow-up by ultrasound in 3 years according did ACR consensus guidelines. Electronically Signed   By: Dorise Bullion III M.D   On: 09/04/2015 21:54   Dg Abd 1 View  09/05/2015  CLINICAL DATA:  NG tube placement EXAM: ABDOMEN - 1 VIEW COMPARISON:  09/05/2015 at 1020 hours FINDINGS: Enteric tube terminates in distal esophagus, just above the GE junction. Multiple mildly prominent/dilated loops of small bowel in the central abdomen, worrisome for possible small bowel obstruction. Surgical clips in the mid abdomen. Mild degenerative changes of the lumbar spine. IMPRESSION: Enteric tube terminates in the distal esophagus, just above the GE junction. Advancement is suggested. Electronically Signed   By: Henderson Newcomer.D.  On: 09/05/2015 16:19   Dg Chest Portable 1 View  09/04/2015  CLINICAL DATA:  Weakness. H/o stroke, afib, cardiomyopathy, and coronary artery disease. EXAM: PORTABLE CHEST 1 VIEW COMPARISON:  07/03/2013 FINDINGS: Cardiac silhouette is normal in size. No mediastinal or hilar masses or evidence of adenopathy. Clear lungs. Relative  lucency in the upper lobes suggests emphysema. No pleural effusion or pneumothorax. Bony thorax is intact. IMPRESSION: No active disease. Electronically Signed   By: Lajean Manes M.D.   On: 09/04/2015 19:47   Dg Abd 2 Views  09/06/2015  CLINICAL DATA:  Small bowel obstruction. EXAM: ABDOMEN - 2 VIEW COMPARISON:  09/05/2015 FINDINGS: Enteric tube projects over the stomach, unchanged. Multiple mildly dilated loops of small bowel remain throughout the abdomen, similar to the prior studies. No significant colonic gas is identified. No intraperitoneal free air identified. Abdominal surgical clips and vascular calcifications are noted. Visualized lung bases are grossly clear. IMPRESSION: Persistent small bowel dilatation consistent with obstruction. Electronically Signed   By: Logan Bores M.D.   On: 09/06/2015 09:29   Dg Abd 2 Views  09/05/2015  CLINICAL DATA:  Evaluate for small bowel obstruction. Nausea, vomiting and diarrhea for 3 days. EXAM: ABDOMEN - 2 VIEW COMPARISON:  09/05/2015.  09/04/2015. FINDINGS: No evidence for free air on the upright view. Increased dilatation of small bowel loops. Largest small bowel loop measures up to 7 cm. Cannot exclude wall thickening involving a bowel loop in the left upper quadrant. Large phleboliths in the pelvis. Surgical clips in the abdomen. IMPRESSION: Increased small bowel dilatation. Findings are concerning for worsening small bowel obstruction. These results will be called to the ordering clinician or representative by the Radiologist Assistant, and communication documented in the PACS or zVision Dashboard. Electronically Signed   By: Markus Daft M.D.   On: 09/05/2015 11:58   Dg Abd Portable 1v  09/05/2015  CLINICAL DATA:  Nasogastric tube placement EXAM: PORTABLE ABDOMEN - 1 VIEW COMPARISON:  Study obtained earlier in the day FINDINGS: Nasogastric tube tip and side port are in the stomach. Visualized bowel remains mildly dilated. No free air evident. Lung bases are  clear. IMPRESSION: Nasogastric tube tip and side port in stomach. Bowel dilatation remains. No free air evident. Lung bases clear. Electronically Signed   By: Lowella Grip III M.D.   On: 09/05/2015 17:23   Dg Abd Portable 1v  09/05/2015  CLINICAL DATA:  NG tube placement adjustment. EXAM: PORTABLE ABDOMEN - 1 VIEW COMPARISON:  Yesterday at 2314 hour FINDINGS: The enteric tube has been retracted. In addition to the side port, the tip is also now in the distal esophagus. Persistent small bowel dilatation in the upper abdomen. IMPRESSION: Enteric tube is been retracted, with tip and side port are now in the distal esophagus. Advancement of at least 8 cm would result in optimal placement. Electronically Signed   By: Jeb Levering M.D.   On: 09/05/2015 02:51   Dg Abd Portable 1v  09/04/2015  CLINICAL DATA:  NG tube placement. EXAM: PORTABLE ABDOMEN - 1 VIEW COMPARISON:  CT earlier this day. FINDINGS: Tip of the enteric tube below the diaphragm in the stomach, the side port is just proximal to the gastroesophageal junction. Recommend advancement of at least 4 cm for optimal placement. Dilated small bowel loops in the included upper abdomen. IMPRESSION: Tip of the enteric tube in the stomach, side-port in the distal esophagus. Recommend advancement of at least 4 cm for optimal placement. Electronically Signed   By: Threasa Beards  Ehinger M.D.   On: 09/04/2015 23:46    Anti-infectives: Anti-infectives    None      Assessment/Plan:  KUB is personally reviewed. Persistent dilated loops This patient with a bowel obstruction and persistent loops on KUB but he is putting out a large amount through his ileostomy and he is not vomited since the nasogastric tube was in place. This patient may require an operation but at this point I would treat conservatively and discussed with family and reexamination.  Florene Glen, MD, FACS  09/06/2015

## 2015-09-06 NOTE — Progress Notes (Signed)
Patient is verbally aggressive towards family and staff. Per family request, they would like for patient to get xanax. Notified Dr. Posey Pronto. Per MD he will add new med. Will continue to monitor. Horton Finer

## 2015-09-06 NOTE — Progress Notes (Signed)
[facility] [place] NAME: [name]    MR#:  [id number]  DATE OF BIRTH:  [birth year]  SUBJECTIVE:   Pulled his NG tube twice last night. Continues have abdominal pain and distention.  REVIEW OF SYSTEMS:    Review of Systems  Unable to perform ROS: dementia    Tolerating Diet:Nothing by mouth      DRUG ALLERGIES:   Allergies  Allergen Reactions  . Fentanyl Other (See Comments)    Delirium  . Hydromorphone Hcl Other (See Comments)    Delirium  . Lorazepam Other (See Comments)    Delirium  . Oxycodone-Acetaminophen Other (See Comments)    Delirium  . Lisinopril   . Vicodin [Hydrocodone-Acetaminophen]     VITALS:  Blood pressure 128/61, pulse 79, temperature 98 F (36.7 C), temperature source Oral, resp. rate 20, height 5\' 10"  (1.778 m), weight 107.729 kg (237 lb 8 oz), SpO2 94 %.  PHYSICAL EXAMINATION:   Physical Exam  Constitutional: He is well-developed, well-nourished, and in no distress. No distress.  HENT:  Head: Normocephalic.  Eyes: No scleral icterus.  Neck: Normal range of motion. Neck supple. No JVD present. No tracheal deviation present.  Cardiovascular: Normal rate, regular rhythm and normal heart sounds.  Exam reveals no gallop and no friction rub.   No murmur heard. Pulmonary/Chest: Effort normal and breath sounds normal. No respiratory distress. He has no wheezes. He has no rales. He exhibits no tenderness.  Abdominal: Soft. Bowel sounds are normal. He exhibits no mass. There is tenderness. There is no rebound and no guarding.  Ileostomy  Musculoskeletal: Normal range of motion. He exhibits no edema.  Neurological: He is alert.  Skin: Skin is warm. No rash noted. No erythema.  Psychiatric: Affect and judgment normal.      LABORATORY PANEL:   CBC  Recent Labs Lab 09/06/15 0456  WBC 7.6  HGB 13.5  HCT 39.3*  PLT 322    ------------------------------------------------------------------------------------------------------------------  Chemistries   Recent Labs Lab 09/04/15 1910  09/06/15 0456  NA 129*  < > 132*  K 4.5  < > 4.3  CL 91*  < > 94*  CO2 23  < > 20*  GLUCOSE 234*  < > 161*  BUN 63*  < > 73*  CREATININE 3.75*  < > 3.11*  CALCIUM 9.4  < > 9.4  AST 59*  --   --   ALT 54  --   --   ALKPHOS 55  --   --   BILITOT 1.6*  --   --   < > = values in this interval not displayed. ------------------------------------------------------------------------------------------------------------------  Cardiac Enzymes  Recent Labs Lab 09/04/15 1910  TROPONINI <0.03   ------------------------------------------------------------------------------------------------------------------  RADIOLOGY:  Ct Abdomen Pelvis Wo Contrast  09/04/2015  CLINICAL DATA:  Nausea and vomiting. EXAM: CT ABDOMEN AND PELVIS WITHOUT CONTRAST TECHNIQUE: Multidetector CT imaging of the abdomen and pelvis was performed following the standard protocol without IV contrast. COMPARISON:  April 17, 2015 FINDINGS: Coronary artery calcifications are identified. Lung bases are otherwise normal. No free air or free fluid. There is a small bowel obstruction. There may be more than 1 transition point. There is a transition point in the right side of the pelvis on approximately axial image 65. After this point, the small bowel returns of normal caliber. More distally, the small bowel is decompressed extending into the ostomy. The patient is status post colectomy. The patient is status post cholecystectomy. The  liver, spleen, pancreas, and adrenal glands are normal. The kidneys are unchanged with no acute abnormalities. 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 No adenopathy. The bladder is decompressed but otherwise unremarkable. No masses or adenopathy in the pelvis. The pelvis is stable.  Visualized bones are unremarkable. IMPRESSION: 1. Distal small bowel obstruction. 2. 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 Recommend follow-up by ultrasound in 3 years according did ACR consensus guidelines. Electronically Signed   By: Dorise Bullion III M.D   On: 09/04/2015 21:54   Dg Abd 1 View  09/05/2015  CLINICAL DATA:  NG tube placement EXAM: ABDOMEN - 1 VIEW COMPARISON:  09/05/2015 at 1020 hours FINDINGS: Enteric tube terminates in distal esophagus, just above the GE junction. Multiple mildly prominent/dilated loops of small bowel in the central abdomen, worrisome for possible small bowel obstruction. Surgical clips in the mid abdomen. Mild degenerative changes of the lumbar spine. IMPRESSION: Enteric tube terminates in the distal esophagus, just above the GE junction. Advancement is suggested. Electronically Signed   By: Julian Hy M.D.   On: 09/05/2015 16:19   Dg Chest Portable 1 View  09/04/2015  CLINICAL DATA:  Weakness. H/o stroke, afib, cardiomyopathy, and coronary artery disease. EXAM: PORTABLE CHEST 1 VIEW COMPARISON:  07/03/2013 FINDINGS: Cardiac silhouette is normal in size. No mediastinal or hilar masses or evidence of adenopathy. Clear lungs. Relative lucency in the upper lobes suggests emphysema. No pleural effusion or pneumothorax. Bony thorax is intact. IMPRESSION: No active disease. Electronically Signed   By: Lajean Manes M.D.   On: 09/04/2015 19:47   Dg Abd 2 Views  09/06/2015  CLINICAL DATA:  Small bowel obstruction. EXAM: ABDOMEN - 2 VIEW COMPARISON:  09/05/2015 FINDINGS: Enteric tube projects over the stomach, unchanged. Multiple mildly dilated loops of small bowel remain throughout the abdomen, similar to the prior studies. No significant colonic gas is identified. No intraperitoneal free air identified. Abdominal surgical clips and vascular calcifications are noted. Visualized lung bases are grossly clear. IMPRESSION: Persistent small bowel dilatation  consistent with obstruction. Electronically Signed   By: Logan Bores M.D.   On: 09/06/2015 09:29   Dg Abd 2 Views  09/05/2015  CLINICAL DATA:  Evaluate for small bowel obstruction. Nausea, vomiting and diarrhea for 3 days. EXAM: ABDOMEN - 2 VIEW COMPARISON:  09/05/2015.  09/04/2015. FINDINGS: No evidence for free air on the upright view. Increased dilatation of small bowel loops. Largest small bowel loop measures up to 7 cm. Cannot exclude wall thickening involving a bowel loop in the left upper quadrant. Large phleboliths in the pelvis. Surgical clips in the abdomen. IMPRESSION: Increased small bowel dilatation. Findings are concerning for worsening small bowel obstruction. These results will be called to the ordering clinician or representative by the Radiologist Assistant, and communication documented in the PACS or zVision Dashboard. Electronically Signed   By: Markus Daft M.D.   On: 09/05/2015 11:58   Dg Abd Portable 1v  09/05/2015  CLINICAL DATA:  Nasogastric tube placement EXAM: PORTABLE ABDOMEN - 1 VIEW COMPARISON:  Study obtained earlier in the day FINDINGS: Nasogastric tube tip and side port are in the stomach. Visualized bowel remains mildly dilated. No free air evident. Lung bases are clear. IMPRESSION: Nasogastric tube tip and side port in stomach. Bowel dilatation remains. No free air evident. Lung bases clear. Electronically Signed   By: Lowella Grip III M.D.   On: 09/05/2015 17:23   Dg Abd Portable 1v  09/05/2015  CLINICAL DATA:  NG tube placement adjustment. EXAM: PORTABLE ABDOMEN - 1 VIEW COMPARISON:  Yesterday at 2314 hour FINDINGS: The enteric tube has been retracted. In addition to the side port, the tip is also now in the distal esophagus. Persistent small bowel dilatation in the upper abdomen. IMPRESSION: Enteric tube is been retracted, with tip and side port are now in the distal esophagus. Advancement of at least 8 cm would result in optimal placement. Electronically Signed   By:  Jeb Levering M.D.   On: 09/05/2015 02:51   Dg Abd Portable 1v  09/04/2015  CLINICAL DATA:  NG tube placement. EXAM: PORTABLE ABDOMEN - 1 VIEW COMPARISON:  CT earlier this day. FINDINGS: Tip of the enteric tube below the diaphragm in the stomach, the side port is just proximal to the gastroesophageal junction. Recommend advancement of at least 4 cm for optimal placement. Dilated small bowel loops in the included upper abdomen. IMPRESSION: Tip of the enteric tube in the stomach, side-port in the distal esophagus. Recommend advancement of at least 4 cm for optimal placement. Electronically Signed   By: Jeb Levering M.D.   On: 09/04/2015 23:46     ASSESSMENT AND PLAN:    75 year old male with a history of dementia who presents with diarrhea and found to have distal small bowel physician.  1. Distal small bowel obstruction: Follow-up on surgical recommendations. Appreciate surgical input Continue NG tube decompression  2. Diabetes: Continue sliding scale insulin  3. Essential hypertension: Blood pressure stable  4. Dementia: Continue Aricept.  5. Depression: Continue Paxil  6. Acute kidney injury on chronic kidney disease stage IV: Baseline creatinine is 2.5. Improved with IV hydration continue to monitor Appreciate nephrology consultation.  7. Hyponatremia: Improved with IV fluids and continue to follow.  Management plans discussed with the patient's wife and she is in agreement.  CODE STATUS: full  TOTAL TIME TAKING CARE OF THIS PATIENT: 30 minutes.     POSSIBLE D/C 2-3 days, DEPENDING ON CLINICAL CONDITION.   Dustin Flock M.D on 09/06/2015 at 1:38 PM  Between 7am to 6pm - Pager - [phone] After 6pm go to [email or web address] - password [email or web address] [facility]  Office  [phone]  CC: Primary care physician; [name], MD  Note: This dictation was prepared with Dragon dictation along with smaller phrase technology. Any transcriptional  errors that result from this process are unintentional.

## 2015-09-06 NOTE — Progress Notes (Signed)
[facility]  ROUNDING NOTE   Subjective:   NG tube placed. Sitter at bedside. More cooperative this morning  Creatinine 3.11 (3.59)  Objective:  Vital signs in last 24 hours:  Temp:  [97.5 F (36.4 C)-97.8 F (36.6 C)] 97.5 F (36.4 C) (06/03 0554) Pulse Rate:  [69-82] 82 (06/03 0931) Resp:  [17-21] 21 (06/03 0554) BP: (134-148)/(59-64) 148/59 mmHg (06/03 0931) SpO2:  [93 %-95 %] 95 % (06/03 0554)  Weight change:  Filed Weights   09/04/15 1914 09/05/15 0305  Weight: 110.224 kg (243 lb) 107.729 kg (237 lb 8 oz)    Intake/Output: I/O last 3 completed shifts: In: 1223.3 [I.V.:1223.3] Out: 3400 [id number]; Emesis/NG output:100; [id number]   Intake/Output this shift:  Total I/O In: -  Out: 200 [Urine:125; Stool:75]  Physical Exam: General: NAD, laying in bed  Head: Normocephalic, atraumatic. Moist oral mucosal membranes  Eyes: Anicteric, PERRL  Neck: Supple, trachea midline  Lungs:  Clear to auscultation  Heart: Regular rate and rhythm  Abdomen:  Obese, tender to palpation, +bowel sounds  Extremities:  no peripheral edema.  Neurologic: Pleasant, confused  Skin: No lesions       Basic Metabolic Panel:  Recent Labs Lab 09/04/15 1910 09/05/15 0406 09/06/15 0456  NA 129* 133* 132*  K 4.5 5.1 4.3  CL 91* 91* 94*  CO2 23 25 20*  GLUCOSE 234* 174* 161*  BUN 63* 67* 73*  CREATININE 3.75* 3.59* 3.11*  CALCIUM 9.4 9.4 9.4    Liver Function Tests:  Recent Labs Lab 09/04/15 1910  AST 59*  ALT 54  ALKPHOS 55  BILITOT 1.6*  PROT 8.0  ALBUMIN 4.1    Recent Labs Lab 09/04/15 1910  LIPASE 23   No results for input(s): AMMONIA in the last 168 hours.  CBC:  Recent Labs Lab 09/04/15 1910 09/05/15 0406 09/06/15 0456  WBC 6.0 5.1 7.6  NEUTROABS 4.0  --   --   HGB 13.7 13.0 13.5  HCT 39.7* 37.1* 39.3*  MCV 88.5 88.6 89.4  PLT 328 285 322    Cardiac Enzymes:  Recent Labs Lab 09/04/15 1910  TROPONINI <0.03     BNP: Invalid input(s): POCBNP  CBG:  Recent Labs Lab 09/05/15 0929 09/05/15 1108 09/05/15 1634 09/05/15 2110 09/06/15 0550  GLUCAP 163* 156* 177* 150* 182*    Microbiology: Results for orders placed or performed during the hospital encounter of 10/26/11  Tissue culture     Status: None   Collection Time: 10/26/11  8:19 AM  Result Value Ref Range Status   Specimen Description TISSUE KNEE LEFT  Final   Special Requests SYNOVIUM LEFT KNEE NO 1  Final   Gram Stain   Final    NO WBC SEEN NO SQUAMOUS EPITHELIAL CELLS SEEN NO ORGANISMS SEEN   Culture NO GROWTH 3 DAYS  Final   Report Status 10/29/2011 FINAL  Final  Anaerobic culture     Status: None   Collection Time: 10/26/11  8:19 AM  Result Value Ref Range Status   Specimen Description TISSUE KNEE LEFT  Final   Special Requests SYNOVIUM LEFT KNEE NO 1  Final   Gram Stain NO WBC SEEN NO ORGANISMS SEEN  Final   Culture NO ANAEROBES ISOLATED  Final   Report Status 10/29/2011 FINAL  Final  Body fluid culture     Status: None   Collection Time: 10/26/11  8:22 AM  Result Value Ref Range Status   Specimen Description SYNOVIAL FLUID LEFT KNEE  Final  Special Requests FLUID ON SWAB SAMPLE NO 2  Final   Gram Stain NO WBC SEEN NO ORGANISMS SEEN  Final   Culture NO GROWTH 3 DAYS  Final   Report Status 10/29/2011 FINAL  Final  Anaerobic culture     Status: None   Collection Time: 10/26/11  8:22 AM  Result Value Ref Range Status   Specimen Description SYNOVIAL FLUID LEFT KNEE  Final   Special Requests FLUID ON SWAB SAMPLE NO 2  Final   Gram Stain NO WBC SEEN NO ORGANISMS SEEN  Final   Culture NO ANAEROBES ISOLATED  Final   Report Status 10/31/2011 FINAL  Final  Anaerobic culture     Status: None   Collection Time: 10/26/11  8:23 AM  Result Value Ref Range Status   Specimen Description TISSUE KNEE LEFT  Final   Special Requests TIBIAL TISSUE LEFT KNEE SAMPLE NO 3  Final   Gram Stain NO WBC SEEN NO ORGANISMS SEEN  Final    Culture NO ANAEROBES ISOLATED  Final   Report Status 10/29/2011 FINAL  Final  Tissue culture     Status: None   Collection Time: 10/26/11  8:23 AM  Result Value Ref Range Status   Specimen Description TISSUE KNEE LEFT  Final   Special Requests TIBIAL TISSUE LEFT KNEE SAMPLE NO 3  Final   Gram Stain   Final    NO WBC SEEN RARE SQUAMOUS EPITHELIAL CELLS PRESENT NO ORGANISMS SEEN   Culture NO GROWTH 3 DAYS  Final   Report Status 10/29/2011 FINAL  Final    Coagulation Studies: No results for input(s): LABPROT, INR in the last 72 hours.  Urinalysis:  Recent Labs  09/04/15 1927  COLORURINE AMBER*  LABSPEC 1.017  PHURINE 5.0  GLUCOSEU 50*  HGBUR NEGATIVE  BILIRUBINUR NEGATIVE  KETONESUR TRACE*  PROTEINUR 100*  NITRITE NEGATIVE  LEUKOCYTESUR NEGATIVE      Imaging: Ct Abdomen Pelvis Wo Contrast  09/04/2015  CLINICAL DATA:  Nausea and vomiting. EXAM: CT ABDOMEN AND PELVIS WITHOUT CONTRAST TECHNIQUE: Multidetector CT imaging of the abdomen and pelvis was performed following the standard protocol without IV contrast. COMPARISON:  April 17, 2015 FINDINGS: Coronary artery calcifications are identified. Lung bases are otherwise normal. No free air or free fluid. There is a small bowel obstruction. There may be more than 1 transition point. There is a transition point in the right side of the pelvis on approximately axial image 65. After this point, the small bowel returns of normal caliber. More distally, the small bowel is decompressed extending into the ostomy. The patient is status post colectomy. The patient is status post cholecystectomy. The liver, spleen, pancreas, and adrenal glands are normal. The kidneys are unchanged with no acute abnormalities. 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 No adenopathy. The bladder is decompressed but otherwise unremarkable. No masses or adenopathy in the pelvis. The pelvis is stable.  Visualized bones are unremarkable. IMPRESSION: 1. Distal small bowel obstruction. 2. 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 Recommend follow-up by ultrasound in 3 years according did ACR consensus guidelines. Electronically Signed   By: Dorise Bullion III M.D   On: 09/04/2015 21:54   Dg Abd 1 View  09/05/2015  CLINICAL DATA:  NG tube placement EXAM: ABDOMEN - 1 VIEW COMPARISON:  09/05/2015 at 1020 hours FINDINGS: Enteric tube terminates in distal esophagus, just above the GE junction. Multiple mildly prominent/dilated loops of small bowel in the central  abdomen, worrisome for possible small bowel obstruction. Surgical clips in the mid abdomen. Mild degenerative changes of the lumbar spine. IMPRESSION: Enteric tube terminates in the distal esophagus, just above the GE junction. Advancement is suggested. Electronically Signed   By: Julian Hy M.D.   On: 09/05/2015 16:19   Dg Chest Portable 1 View  09/04/2015  CLINICAL DATA:  Weakness. H/o stroke, afib, cardiomyopathy, and coronary artery disease. EXAM: PORTABLE CHEST 1 VIEW COMPARISON:  07/03/2013 FINDINGS: Cardiac silhouette is normal in size. No mediastinal or hilar masses or evidence of adenopathy. Clear lungs. Relative lucency in the upper lobes suggests emphysema. No pleural effusion or pneumothorax. Bony thorax is intact. IMPRESSION: No active disease. Electronically Signed   By: Lajean Manes M.D.   On: 09/04/2015 19:47   Dg Abd 2 Views  09/06/2015  CLINICAL DATA:  Small bowel obstruction. EXAM: ABDOMEN - 2 VIEW COMPARISON:  09/05/2015 FINDINGS: Enteric tube projects over the stomach, unchanged. Multiple mildly dilated loops of small bowel remain throughout the abdomen, similar to the prior studies. No significant colonic gas is identified. No intraperitoneal free air identified. Abdominal surgical clips and vascular calcifications are noted. Visualized lung bases are grossly clear. IMPRESSION: Persistent small bowel dilatation  consistent with obstruction. Electronically Signed   By: Logan Bores M.D.   On: 09/06/2015 09:29   Dg Abd 2 Views  09/05/2015  CLINICAL DATA:  Evaluate for small bowel obstruction. Nausea, vomiting and diarrhea for 3 days. EXAM: ABDOMEN - 2 VIEW COMPARISON:  09/05/2015.  09/04/2015. FINDINGS: No evidence for free air on the upright view. Increased dilatation of small bowel loops. Largest small bowel loop measures up to 7 cm. Cannot exclude wall thickening involving a bowel loop in the left upper quadrant. Large phleboliths in the pelvis. Surgical clips in the abdomen. IMPRESSION: Increased small bowel dilatation. Findings are concerning for worsening small bowel obstruction. These results will be called to the ordering clinician or representative by the Radiologist Assistant, and communication documented in the PACS or zVision Dashboard. Electronically Signed   By: Markus Daft M.D.   On: 09/05/2015 11:58   Dg Abd Portable 1v  09/05/2015  CLINICAL DATA:  Nasogastric tube placement EXAM: PORTABLE ABDOMEN - 1 VIEW COMPARISON:  Study obtained earlier in the day FINDINGS: Nasogastric tube tip and side port are in the stomach. Visualized bowel remains mildly dilated. No free air evident. Lung bases are clear. IMPRESSION: Nasogastric tube tip and side port in stomach. Bowel dilatation remains. No free air evident. Lung bases clear. Electronically Signed   By: Lowella Grip III M.D.   On: 09/05/2015 17:23   Dg Abd Portable 1v  09/05/2015  CLINICAL DATA:  NG tube placement adjustment. EXAM: PORTABLE ABDOMEN - 1 VIEW COMPARISON:  Yesterday at 2314 hour FINDINGS: The enteric tube has been retracted. In addition to the side port, the tip is also now in the distal esophagus. Persistent small bowel dilatation in the upper abdomen. IMPRESSION: Enteric tube is been retracted, with tip and side port are now in the distal esophagus. Advancement of at least 8 cm would result in optimal placement. Electronically Signed   By:  Jeb Levering M.D.   On: 09/05/2015 02:51   Dg Abd Portable 1v  09/04/2015  CLINICAL DATA:  NG tube placement. EXAM: PORTABLE ABDOMEN - 1 VIEW COMPARISON:  CT earlier this day. FINDINGS: Tip of the enteric tube below the diaphragm in the stomach, the side port is just proximal to the gastroesophageal junction. Recommend advancement  of at least 4 cm for optimal placement. Dilated small bowel loops in the included upper abdomen. IMPRESSION: Tip of the enteric tube in the stomach, side-port in the distal esophagus. Recommend advancement of at least 4 cm for optimal placement. Electronically Signed   By: Jeb Levering M.D.   On: 09/04/2015 23:46     Medications:   . sodium chloride 50 mL/hr at 09/05/15 1527   . aspirin EC  81 mg Oral Daily  . diltiazem  240 mg Oral Daily  . donepezil  10 mg Oral QHS  . heparin  5,000 Units Subcutaneous Q8H  . insulin aspart  0-9 Units Subcutaneous Q6H  . metoprolol tartrate  25 mg Oral BID  . pantoprazole  40 mg Oral Q1200  . PARoxetine  40 mg Oral Daily  . rosuvastatin  20 mg Oral QHS   acetaminophen **OR** acetaminophen, albuterol, ondansetron **OR** ondansetron (ZOFRAN) IV  Assessment/ Plan:  Mr. POWER ARNET is a 75 y.o. white male Mr. LAWERNCE DU is a 75 y.o. white male with vascular dementia, COPD, hypertension, anemia, hyperlipidemia, insulin dependent diabetes mellitus, BPH, colon resection with ileostomy, bladder cancer, AAA, CVA, CAD, atrial fibrillation, congestive heart failrue, who was admitted to [facility] on 09/04/2015.   1. Acute renal failure on chronic kidney disease stage IV with proteinuria: Chronic kidney disease stage IV secondary to diabetes, vascular disease and hypertension. Baseline creatinine seems to be ~2.5. Follows with Dr. Florene Glen, [facility] in [place].  Acute renal failure secondary to small bowel obstruction and prerenal azotemia.  - Continue IV fluids.  - renally dose medications - Discussed with  family. Family is not considering dialysis.   2. Hyponatremia: Hypovolemic. Could have some chronic hyponatremia from chronic kidney disease.  - Continue IV NS  3. 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  - Continue glucose control.    LOS: [place], [place] 6/3/201710:08 AM

## 2015-09-06 NOTE — Progress Notes (Signed)
Visited patient daughter and son present. Patient remains confused. Vital signs are stable abdomen is distended but minimally and is soft and certainly nontender there is still fairly high output from his ileostomy 350 cc just emptied of liquid succus. A long discussion was held with the patient but mostly with his daughter and son discussing the options and the potential for surgery should his condition did not improve or should it worsen. I also discussed hospice the patient's daughter is a [employer] hosp[employer] nurse and understood much of this she had multiple questions concerning end-of-life care because of his dementia and confusion. We also discussed the risk of perforation in the end-of-life issues concerning small bowel obstruction she will present these options to the rest of her family and we will continue to observe and obtain new films in the morning there is a fairly strong potential for the need for surgery should this not improve.

## 2015-09-07 ENCOUNTER — Inpatient Hospital Stay: Payer: Medicare Other

## 2015-09-07 LAB — RENAL FUNCTION PANEL
Albumin: 3.2 g/dL — ABNORMAL LOW (ref 3.5–5.0)
Anion gap: 17 — ABNORMAL HIGH (ref 5–15)
BUN: 93 mg/dL — AB (ref 6–20)
CALCIUM: 9.2 mg/dL (ref 8.9–10.3)
CHLORIDE: 97 mmol/L — AB (ref 101–111)
CO2: 19 mmol/L — AB (ref 22–32)
CREATININE: 3.06 mg/dL — AB (ref 0.61–1.24)
GFR calc non Af Amer: 18 mL/min — ABNORMAL LOW (ref >60)
GFR, EST AFRICAN AMERICAN: 21 mL/min — AB (ref >60)
Glucose, Bld: 230 mg/dL — ABNORMAL HIGH (ref 65–99)
Phosphorus: 4.9 mg/dL — ABNORMAL HIGH (ref 2.5–4.6)
Potassium: 4.2 mmol/L (ref 3.5–5.1)
SODIUM: 133 mmol/L — AB (ref 135–145)

## 2015-09-07 LAB — GLUCOSE, CAPILLARY
GLUCOSE-CAPILLARY: 228 mg/dL — AB (ref 65–99)
GLUCOSE-CAPILLARY: 232 mg/dL — AB (ref 65–99)
GLUCOSE-CAPILLARY: 234 mg/dL — AB (ref 65–99)
Glucose-Capillary: 196 mg/dL — ABNORMAL HIGH (ref 65–99)
Glucose-Capillary: 271 mg/dL — ABNORMAL HIGH (ref 65–99)

## 2015-09-07 MED ORDER — ALPRAZOLAM 0.25 MG PO TABS: 0.2500 mg | ORAL_TABLET | Freq: Three times a day (TID) | ORAL | Status: DC | PRN

## 2015-09-07 NOTE — Progress Notes (Signed)
[facility] NAME: [name]    MR#:  [id number]  DATE OF BIRTH:  1940/08/30  SUBJECTIVE:  NG tube in place currently. No significant output patient later drowsy  REVIEW OF SYSTEMS:    Review of Systems  Unable to perform ROS: dementia    Tolerating Diet:Nothing by mouth      DRUG ALLERGIES:   Allergies  Allergen Reactions  . Fentanyl Other (See Comments)    Delirium  . Hydromorphone Hcl Other (See Comments)    Delirium  . Lorazepam Other (See Comments)    Delirium  . Oxycodone-Acetaminophen Other (See Comments)    Delirium  . Lisinopril   . Vicodin [Hydrocodone-Acetaminophen]     VITALS:  Blood pressure 132/61, pulse 69, temperature 97.6 F (36.4 C), temperature source Oral, resp. rate 18, height 5\' 10"  (1.778 m), weight 107.729 kg (237 lb 8 oz), SpO2 99 %.  PHYSICAL EXAMINATION:   Physical Exam  Constitutional: He is well-developed, well-nourished, and in no distress. No distress.  HENT:  Head: Normocephalic.  Eyes: No scleral icterus.  Neck: Normal range of motion. Neck supple. No JVD present. No tracheal deviation present.  Cardiovascular: Normal rate, regular rhythm and normal heart sounds.  Exam reveals no gallop and no friction rub.   No murmur heard. Pulmonary/Chest: Effort normal and breath sounds normal. No respiratory distress. He has no wheezes. He has no rales. He exhibits no tenderness.  Abdominal: Soft. Diminished bowel sounds He exhibits no mass. Nontender There is no rebound and no guarding.  Ileostomy  Musculoskeletal: Normal range of motion. He exhibits no edema.  Neurological: He is alert.  Skin: Skin is warm. No rash noted. No erythema.  Psychiatric: Affect and judgment normal.      LABORATORY PANEL:   CBC  Recent Labs Lab 09/06/15 0456  WBC 7.6  HGB 13.5  HCT 39.3*  PLT 322    ------------------------------------------------------------------------------------------------------------------  Chemistries   Recent Labs Lab 09/04/15 1910  09/07/15 0547  NA 129*  < > 133*  K 4.5  < > 4.2  CL 91*  < > 97*  CO2 23  < > 19*  GLUCOSE 234*  < > 230*  BUN 63*  < > 93*  CREATININE 3.75*  < > 3.06*  CALCIUM 9.4  < > 9.2  AST 59*  --   --   ALT 54  --   --   ALKPHOS 55  --   --   BILITOT 1.6*  --   --   < > = values in this interval not displayed. ------------------------------------------------------------------------------------------------------------------  Cardiac Enzymes  Recent Labs Lab 09/04/15 1910  TROPONINI <0.03   ------------------------------------------------------------------------------------------------------------------  RADIOLOGY:  Dg Abd 1 View  09/05/2015  CLINICAL DATA:  NG tube placement EXAM: ABDOMEN - 1 VIEW COMPARISON:  09/05/2015 at 1020 hours FINDINGS: Enteric tube terminates in distal esophagus, just above the GE junction. Multiple mildly prominent/dilated loops of small bowel in the central abdomen, worrisome for possible small bowel obstruction. Surgical clips in the mid abdomen. Mild degenerative changes of the lumbar spine. IMPRESSION: Enteric tube terminates in the distal esophagus, just above the GE junction. Advancement is suggested. Electronically Signed   By: Julian Hy M.D.   On: 09/05/2015 16:19   Ct Head Wo Contrast  09/06/2015  CLINICAL DATA:  Increased confusion EXAM: CT HEAD WITHOUT CONTRAST TECHNIQUE: Contiguous axial images were obtained from the base of the skull through the vertex without intravenous  contrast. COMPARISON:  October 06, 2013 FINDINGS: There is moderate diffuse atrophy, stable. There is no intracranial mass, acute hemorrhage, extra-axial fluid collection, or midline shift. There is patchy small vessel disease throughout the centra semiovale bilaterally. There is evidence of a prior infarct involving  the anterior limb of the left external capsule, stable. There is calcification along the gyri of the medial right April upper occipital and inferior right parietal surfaces, a stable finding likely representing residua of amyloid angiography. There is no new calcification or new gray-white compartment lesion. No acute infarct is evident. The bony calvarium appears intact. The mastoid air cells clear. Visualized orbits appear symmetric bilaterally. IMPRESSION: Atrophy with small vessel disease in the periventricular white matter as well as prior infarct in the anterior limb of the left external capsule. Calcification along the medial posterior gyri of the upper right occipital and inferior right parietal lobes, likely residua of amyloid angiography. No acute hemorrhage evident. No acute infarct evident. No mass effect or edema. Electronically Signed   By: Lowella Grip III M.D.   On: 09/06/2015 15:09   Dg Abd 2 Views  09/07/2015  CLINICAL DATA:  Abdominal pain, distention. History of small bowel obstruction. EXAM: ABDOMEN - 2 VIEW COMPARISON:  09/06/2015 FINDINGS: NG tube remains present in the stomach. Continued dilated small bowel loops in the abdomen and pelvis, not significantly changed since prior study compatible with small bowel obstruction. No free air organomegaly. IMPRESSION: Continued small bowel obstruction pattern. Electronically Signed   By: Rolm Baptise M.D.   On: 09/07/2015 09:25   Dg Abd 2 Views  09/06/2015  CLINICAL DATA:  Small bowel obstruction. EXAM: ABDOMEN - 2 VIEW COMPARISON:  09/05/2015 FINDINGS: Enteric tube projects over the stomach, unchanged. Multiple mildly dilated loops of small bowel remain throughout the abdomen, similar to the prior studies. No significant colonic gas is identified. No intraperitoneal free air identified. Abdominal surgical clips and vascular calcifications are noted. Visualized lung bases are grossly clear. IMPRESSION: Persistent small bowel dilatation  consistent with obstruction. Electronically Signed   By: Logan Bores M.D.   On: 09/06/2015 09:29   Dg Abd 2 Views  09/05/2015  CLINICAL DATA:  Evaluate for small bowel obstruction. Nausea, vomiting and diarrhea for 3 days. EXAM: ABDOMEN - 2 VIEW COMPARISON:  09/05/2015.  09/04/2015. FINDINGS: No evidence for free air on the upright view. Increased dilatation of small bowel loops. Largest small bowel loop measures up to 7 cm. Cannot exclude wall thickening involving a bowel loop in the left upper quadrant. Large phleboliths in the pelvis. Surgical clips in the abdomen. IMPRESSION: Increased small bowel dilatation. Findings are concerning for worsening small bowel obstruction. These results will be called to the ordering clinician or representative by the Radiologist Assistant, and communication documented in the PACS or zVision Dashboard. Electronically Signed   By: Markus Daft M.D.   On: 09/05/2015 11:58   Dg Abd Portable 1v  09/05/2015  CLINICAL DATA:  Nasogastric tube placement EXAM: PORTABLE ABDOMEN - 1 VIEW COMPARISON:  Study obtained earlier in the day FINDINGS: Nasogastric tube tip and side port are in the stomach. Visualized bowel remains mildly dilated. No free air evident. Lung bases are clear. IMPRESSION: Nasogastric tube tip and side port in stomach. Bowel dilatation remains. No free air evident. Lung bases clear. Electronically Signed   By: Lowella Grip III M.D.   On: 09/05/2015 17:23     ASSESSMENT AND PLAN:    75 year old male with a history of dementia who  presents with diarrhea and found to have distal small bowel physician.  1. Distal small bowel obstruction:  Review x-ray with surgery  Appreciate surgical input Continue NG tube decompression clamped once okay per surgery  2. Diabetes: Continue sliding scale insulin  3. Essential hypertension: Blood pressure stable  4. Dementia: Continue Aricept.  5. Depression: Continue Paxil For anxiety was placed on Xanax scheduled  but I will change that to when necessary now  6. Acute kidney injury on chronic kidney disease stage IV: Baseline creatinine is 2.5. Improved with IV hydration continue to monitor Appreciate nephrology consultation.  7. Hyponatremia: Improved with IV fluids and continue to follow.  Management plans discussed with the patient's wife and she is in agreement.  CODE STATUS: full  TOTAL TIME TAKING CARE OF THIS PATIENT: 30 minutes.     POSSIBLE D/C 2-3 days, DEPENDING ON CLINICAL CONDITION.   Dustin Flock M.D on 09/07/2015 at 9:36 AM  Between 7am to 6pm - Pager - [phone] After 6pm go to [email or web address] - password [email or web address] [facility]  Office  [phone]  CC: Primary care physician; [name], MD  Note: This dictation was prepared with Dragon dictation along with smaller phrase technology. Any transcriptional errors that result from this process are unintentional.

## 2015-09-07 NOTE — Progress Notes (Signed)
Patient has nasogastric tube clamped is putting out fluid into his ileostomy without difficulty. When he has tried liquids he has experienced mild nausea and mild pain that was transient according to the family and  in fact currently he does not have any pain or nausea. I'm reluctant to remove his nasogastric tube just yet will reassess later today

## 2015-09-07 NOTE — Progress Notes (Signed)
[facility]  ROUNDING NOTE   Subjective:   Family meeting yesterday. Discussed end of life. Patient's family wants to see what abdominal x-ray shows and what surgery suggests.   Creatinine 3.06 (3.11) (3.59)  Objective:  Vital signs in last 24 hours:  Temp:  [97.6 F (36.4 C)-98 F (36.7 C)] 97.6 F (36.4 C) (06/04 0432) Pulse Rate:  [69-79] 69 (06/04 0432) Resp:  [18-22] 18 (06/04 0432) BP: (128-132)/(43-61) 132/61 mmHg (06/04 0432) SpO2:  [92 %-99 %] 99 % (06/04 0432)  Weight change:  Filed Weights   09/04/15 1914 09/05/15 0305  Weight: 110.224 kg (243 lb) 107.729 kg (237 lb 8 oz)    Intake/Output: I/O last 3 completed shifts: In: 1200 [I.V.:1200] Out: 3450 [Urine:1100; Emesis/NG output:100; Stool:2250]   Intake/Output this shift:  Total I/O In: -  Out: 150 [Stool:150]  Physical Exam: General: NAD, laying in bed  Head: Normocephalic, atraumatic. Moist oral mucosal membranes  Eyes: Anicteric, PERRL  Neck: Supple, trachea midline  Lungs:  Clear to auscultation  Heart: Regular rate and rhythm  Abdomen:  Obese, tender to palpation, +bowel sounds  Extremities:  no peripheral edema.  Neurologic: Pleasant, confused  Skin: No lesions       Basic Metabolic Panel:  Recent Labs Lab 09/04/15 1910 09/05/15 0406 09/06/15 0456 09/07/15 0547  NA 129* 133* 132* 133*  K 4.5 5.1 4.3 4.2  CL 91* 91* 94* 97*  CO2 23 25 20* 19*  GLUCOSE 234* 174* 161* 230*  BUN 63* 67* 73* 93*  CREATININE 3.75* 3.59* 3.11* 3.06*  CALCIUM 9.4 9.4 9.4 9.2  PHOS  --   --   --  4.9*    Liver Function Tests:  Recent Labs Lab 09/04/15 1910 09/07/15 0547  AST 59*  --   ALT 54  --   ALKPHOS 55  --   BILITOT 1.6*  --   PROT 8.0  --   ALBUMIN 4.1 3.2*    Recent Labs Lab 09/04/15 1910  LIPASE 23   No results for input(s): AMMONIA in the last 168 hours.  CBC:  Recent Labs Lab 09/04/15 1910 09/05/15 0406 09/06/15 0456  WBC 6.0 5.1 7.6  NEUTROABS 4.0  --    --   HGB 13.7 13.0 13.5  HCT 39.7* 37.1* 39.3*  MCV 88.5 88.6 89.4  PLT 328 285 322    Cardiac Enzymes:  Recent Labs Lab 09/04/15 1910  TROPONINI <0.03    BNP: Invalid input(s): POCBNP  CBG:  Recent Labs Lab 09/06/15 0550 09/06/15 1155 09/06/15 1728 09/07/15 0018 09/07/15 0605  GLUCAP 182* 199* 208* 196* 60*    Microbiology: Results for orders placed or performed during the hospital encounter of 10/26/11  Tissue culture     Status: None   Collection Time: 10/26/11  8:19 AM  Result Value Ref Range Status   Specimen Description TISSUE KNEE LEFT  Final   Special Requests SYNOVIUM LEFT KNEE NO 1  Final   Gram Stain   Final    NO WBC SEEN NO SQUAMOUS EPITHELIAL CELLS SEEN NO ORGANISMS SEEN   Culture NO GROWTH 3 DAYS  Final   Report Status 10/29/2011 FINAL  Final  Anaerobic culture     Status: None   Collection Time: 10/26/11  8:19 AM  Result Value Ref Range Status   Specimen Description TISSUE KNEE LEFT  Final   Special Requests SYNOVIUM LEFT KNEE NO 1  Final   Gram Stain NO WBC SEEN NO ORGANISMS SEEN  Final   Culture NO ANAEROBES ISOLATED  Final   Report Status 10/29/2011 FINAL  Final  Body fluid culture     Status: None   Collection Time: 10/26/11  8:22 AM  Result Value Ref Range Status   Specimen Description SYNOVIAL FLUID LEFT KNEE  Final   Special Requests FLUID ON SWAB SAMPLE NO 2  Final   Gram Stain NO WBC SEEN NO ORGANISMS SEEN  Final   Culture NO GROWTH 3 DAYS  Final   Report Status 10/29/2011 FINAL  Final  Anaerobic culture     Status: None   Collection Time: 10/26/11  8:22 AM  Result Value Ref Range Status   Specimen Description SYNOVIAL FLUID LEFT KNEE  Final   Special Requests FLUID ON SWAB SAMPLE NO 2  Final   Gram Stain NO WBC SEEN NO ORGANISMS SEEN  Final   Culture NO ANAEROBES ISOLATED  Final   Report Status 10/31/2011 FINAL  Final  Anaerobic culture     Status: None   Collection Time: 10/26/11  8:23 AM  Result Value Ref Range  Status   Specimen Description TISSUE KNEE LEFT  Final   Special Requests TIBIAL TISSUE LEFT KNEE SAMPLE NO 3  Final   Gram Stain NO WBC SEEN NO ORGANISMS SEEN  Final   Culture NO ANAEROBES ISOLATED  Final   Report Status 10/29/2011 FINAL  Final  Tissue culture     Status: None   Collection Time: 10/26/11  8:23 AM  Result Value Ref Range Status   Specimen Description TISSUE KNEE LEFT  Final   Special Requests TIBIAL TISSUE LEFT KNEE SAMPLE NO 3  Final   Gram Stain   Final    NO WBC SEEN RARE SQUAMOUS EPITHELIAL CELLS PRESENT NO ORGANISMS SEEN   Culture NO GROWTH 3 DAYS  Final   Report Status 10/29/2011 FINAL  Final    Coagulation Studies: No results for input(s): LABPROT, INR in the last 72 hours.  Urinalysis:  Recent Labs  09/04/15 1927  COLORURINE AMBER*  LABSPEC 1.017  PHURINE 5.0  GLUCOSEU 50*  HGBUR NEGATIVE  BILIRUBINUR NEGATIVE  KETONESUR TRACE*  PROTEINUR 100*  NITRITE NEGATIVE  LEUKOCYTESUR NEGATIVE      Imaging: Dg Abd 1 View  09/05/2015  CLINICAL DATA:  NG tube placement EXAM: ABDOMEN - 1 VIEW COMPARISON:  09/05/2015 at 1020 hours FINDINGS: Enteric tube terminates in distal esophagus, just above the GE junction. Multiple mildly prominent/dilated loops of small bowel in the central abdomen, worrisome for possible small bowel obstruction. Surgical clips in the mid abdomen. Mild degenerative changes of the lumbar spine. IMPRESSION: Enteric tube terminates in the distal esophagus, just above the GE junction. Advancement is suggested. Electronically Signed   By: Julian Hy M.D.   On: 09/05/2015 16:19   Ct Head Wo Contrast  09/06/2015  CLINICAL DATA:  Increased confusion EXAM: CT HEAD WITHOUT CONTRAST TECHNIQUE: Contiguous axial images were obtained from the base of the skull through the vertex without intravenous contrast. COMPARISON:  October 06, 2013 FINDINGS: There is moderate diffuse atrophy, stable. There is no intracranial mass, acute hemorrhage, extra-axial  fluid collection, or midline shift. There is patchy small vessel disease throughout the centra semiovale bilaterally. There is evidence of a prior infarct involving the anterior limb of the left external capsule, stable. There is calcification along the gyri of the medial right April upper occipital and inferior right parietal surfaces, a stable finding likely representing residua of amyloid angiography. There is no new  calcification or new gray-white compartment lesion. No acute infarct is evident. The bony calvarium appears intact. The mastoid air cells clear. Visualized orbits appear symmetric bilaterally. IMPRESSION: Atrophy with small vessel disease in the periventricular white matter as well as prior infarct in the anterior limb of the left external capsule. Calcification along the medial posterior gyri of the upper right occipital and inferior right parietal lobes, likely residua of amyloid angiography. No acute hemorrhage evident. No acute infarct evident. No mass effect or edema. Electronically Signed   By: Lowella Grip III M.D.   On: 09/06/2015 15:09   Dg Abd 2 Views  09/07/2015  CLINICAL DATA:  Abdominal pain, distention. History of small bowel obstruction. EXAM: ABDOMEN - 2 VIEW COMPARISON:  09/06/2015 FINDINGS: NG tube remains present in the stomach. Continued dilated small bowel loops in the abdomen and pelvis, not significantly changed since prior study compatible with small bowel obstruction. No free air organomegaly. IMPRESSION: Continued small bowel obstruction pattern. Electronically Signed   By: Rolm Baptise M.D.   On: 09/07/2015 09:25   Dg Abd 2 Views  09/06/2015  CLINICAL DATA:  Small bowel obstruction. EXAM: ABDOMEN - 2 VIEW COMPARISON:  09/05/2015 FINDINGS: Enteric tube projects over the stomach, unchanged. Multiple mildly dilated loops of small bowel remain throughout the abdomen, similar to the prior studies. No significant colonic gas is identified. No intraperitoneal free air  identified. Abdominal surgical clips and vascular calcifications are noted. Visualized lung bases are grossly clear. IMPRESSION: Persistent small bowel dilatation consistent with obstruction. Electronically Signed   By: Logan Bores M.D.   On: 09/06/2015 09:29   Dg Abd 2 Views  09/05/2015  CLINICAL DATA:  Evaluate for small bowel obstruction. Nausea, vomiting and diarrhea for 3 days. EXAM: ABDOMEN - 2 VIEW COMPARISON:  09/05/2015.  09/04/2015. FINDINGS: No evidence for free air on the upright view. Increased dilatation of small bowel loops. Largest small bowel loop measures up to 7 cm. Cannot exclude wall thickening involving a bowel loop in the left upper quadrant. Large phleboliths in the pelvis. Surgical clips in the abdomen. IMPRESSION: Increased small bowel dilatation. Findings are concerning for worsening small bowel obstruction. These results will be called to the ordering clinician or representative by the Radiologist Assistant, and communication documented in the PACS or zVision Dashboard. Electronically Signed   By: Markus Daft M.D.   On: 09/05/2015 11:58   Dg Abd Portable 1v  09/05/2015  CLINICAL DATA:  Nasogastric tube placement EXAM: PORTABLE ABDOMEN - 1 VIEW COMPARISON:  Study obtained earlier in the day FINDINGS: Nasogastric tube tip and side port are in the stomach. Visualized bowel remains mildly dilated. No free air evident. Lung bases are clear. IMPRESSION: Nasogastric tube tip and side port in stomach. Bowel dilatation remains. No free air evident. Lung bases clear. Electronically Signed   By: Lowella Grip III M.D.   On: 09/05/2015 17:23     Medications:   . sodium chloride 50 mL/hr at 09/07/15 0641   . aspirin EC  81 mg Oral Daily  . diltiazem  240 mg Oral Daily  . donepezil  10 mg Oral QHS  . heparin  5,000 Units Subcutaneous Q8H  . insulin aspart  0-9 Units Subcutaneous Q6H  . metoprolol tartrate  25 mg Oral BID  . pantoprazole  40 mg Oral Q1200  . PARoxetine  40 mg Oral  Daily  . rosuvastatin  20 mg Oral QHS   acetaminophen **OR** acetaminophen, albuterol, ALPRAZolam, ondansetron **OR** ondansetron (ZOFRAN) IV  Assessment/ Plan:  Mr. DOMONIK MINERD is a 75 y.o. white male Mr. VERGIE BRONNER is a 76 y.o. white male with vascular dementia, COPD, hypertension, anemia, hyperlipidemia, insulin dependent diabetes mellitus, BPH, colon resection with ileostomy, bladder cancer, AAA, CVA, CAD, atrial fibrillation, congestive heart failrue, who was admitted to [facility] on 09/04/2015.   1. Acute renal failure on chronic kidney disease stage IV with proteinuria: Chronic kidney disease stage IV secondary to diabetes, vascular disease and hypertension. Baseline creatinine seems to be ~2.5. Follows with Dr. Florene Glen, [facility] in [place].  Acute renal failure secondary to small bowel obstruction and prerenal azotemia.  - Continue IV fluids gentle.  - renally dose medications - Discussed with family. Family is not considering dialysis.   2. Hyponatremia: Hypovolemic. Could have some chronic hyponatremia from chronic kidney disease.  - Continue IV NS  3. 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  - Continue glucose control.    LOS: 2 Hermione Havlicek 6/4/20179:57 AM

## 2015-09-07 NOTE — Progress Notes (Signed)
Family meeting with patient and multiple family members.  Patient's daughter is a prior hospice nurse. He states that their family meeting last night resulted in the decision that should surgery be indicated they would not opt for that at this point.  I personally reviewed the patient's KUB which is not much change but the diameter of the bowel seems to be slightly less and that certainly not worsened. He has very low nasogastric tube output and fairly normal ileostomy output (1350 ML's). With that in mind and the patient's family's decision making I have suggested that we attempt to clamp his nasogastric tube and start clear liquids if he tolerates this I will return this afternoon or remove his nasogastric tube. Certainly they are not considering surgical intervention due to the patient's serious medical conditions and debilitated state.

## 2015-09-07 NOTE — Progress Notes (Signed)
Patient pulled NG tube out. Notified Dr. Burt Knack. Per MD okay to leave NG tube out. Patient has no complaints. Will continue to monitor. Horton Finer

## 2015-09-07 NOTE — Progress Notes (Signed)
CC: Bowel obstruction Subjective: This patient admitted the hospital with a partial small bowel obstruction. No family is present but I had spoken with them yesterday concerning potential surgical intervention if needed. Currently the patient is having no pain but his answers are quite unreliable.  Objective: Vital signs in last 24 hours: Temp:  [97.6 F (36.4 C)-98 F (36.7 C)] 97.6 F (36.4 C) (06/04 0432) Pulse Rate:  [69-82] 69 (06/04 0432) Resp:  [18-22] 18 (06/04 0432) BP: (128-148)/(43-61) 132/61 mmHg (06/04 0432) SpO2:  [92 %-99 %] 99 % (06/04 0432) Last BM Date: 09/06/15  Intake/Output from previous day: 06/03 0701 - 06/04 0700 In: 600 [I.V.:600] Out: 1875 [Urine:550; Stool:1325] Intake/Output this shift: Total I/O In: -  Out: 150 [Stool:150]  Physical exam:  Awake and alert. Vital signs reviewed and stable Abdomen is soft nondistended nontympanitic and nontender ileostomy has liquid stool or succus present Nontender calves Ileostomy output is been 1.3 L Lab Results: CBC   Recent Labs  09/05/15 0406 09/06/15 0456  WBC 5.1 7.6  HGB 13.0 13.5  HCT 37.1* 39.3*  PLT 285 322   BMET  Recent Labs  09/06/15 0456 09/07/15 0547  NA 132* 133*  K 4.3 4.2  CL 94* 97*  CO2 20* 19*  GLUCOSE 161* 230*  BUN 73* 93*  CREATININE 3.11* 3.06*  CALCIUM 9.4 9.2   PT/INR No results for input(s): LABPROT, INR in the last 72 hours. ABG No results for input(s): PHART, HCO3 in the last 72 hours.  Invalid input(s): PCO2, PO2  Studies/Results: Dg Abd 1 View  09/05/2015  CLINICAL DATA:  NG tube placement EXAM: ABDOMEN - 1 VIEW COMPARISON:  09/05/2015 at 1020 hours FINDINGS: Enteric tube terminates in distal esophagus, just above the GE junction. Multiple mildly prominent/dilated loops of small bowel in the central abdomen, worrisome for possible small bowel obstruction. Surgical clips in the mid abdomen. Mild degenerative changes of the lumbar spine. IMPRESSION: Enteric  tube terminates in the distal esophagus, just above the GE junction. Advancement is suggested. Electronically Signed   By: Julian Hy M.D.   On: 09/05/2015 16:19   Ct Head Wo Contrast  09/06/2015  CLINICAL DATA:  Increased confusion EXAM: CT HEAD WITHOUT CONTRAST TECHNIQUE: Contiguous axial images were obtained from the base of the skull through the vertex without intravenous contrast. COMPARISON:  October 06, 2013 FINDINGS: There is moderate diffuse atrophy, stable. There is no intracranial mass, acute hemorrhage, extra-axial fluid collection, or midline shift. There is patchy small vessel disease throughout the centra semiovale bilaterally. There is evidence of a prior infarct involving the anterior limb of the left external capsule, stable. There is calcification along the gyri of the medial right April upper occipital and inferior right parietal surfaces, a stable finding likely representing residua of amyloid angiography. There is no new calcification or new gray-white compartment lesion. No acute infarct is evident. The bony calvarium appears intact. The mastoid air cells clear. Visualized orbits appear symmetric bilaterally. IMPRESSION: Atrophy with small vessel disease in the periventricular white matter as well as prior infarct in the anterior limb of the left external capsule. Calcification along the medial posterior gyri of the upper right occipital and inferior right parietal lobes, likely residua of amyloid angiography. No acute hemorrhage evident. No acute infarct evident. No mass effect or edema. Electronically Signed   By: Lowella Grip III M.D.   On: 09/06/2015 15:09   Dg Abd 2 Views  09/06/2015  CLINICAL DATA:  Small bowel obstruction. EXAM:  ABDOMEN - 2 VIEW COMPARISON:  09/05/2015 FINDINGS: Enteric tube projects over the stomach, unchanged. Multiple mildly dilated loops of small bowel remain throughout the abdomen, similar to the prior studies. No significant colonic gas is identified.  No intraperitoneal free air identified. Abdominal surgical clips and vascular calcifications are noted. Visualized lung bases are grossly clear. IMPRESSION: Persistent small bowel dilatation consistent with obstruction. Electronically Signed   By: Logan Bores M.D.   On: 09/06/2015 09:29   Dg Abd 2 Views  09/05/2015  CLINICAL DATA:  Evaluate for small bowel obstruction. Nausea, vomiting and diarrhea for 3 days. EXAM: ABDOMEN - 2 VIEW COMPARISON:  09/05/2015.  09/04/2015. FINDINGS: No evidence for free air on the upright view. Increased dilatation of small bowel loops. Largest small bowel loop measures up to 7 cm. Cannot exclude wall thickening involving a bowel loop in the left upper quadrant. Large phleboliths in the pelvis. Surgical clips in the abdomen. IMPRESSION: Increased small bowel dilatation. Findings are concerning for worsening small bowel obstruction. These results will be called to the ordering clinician or representative by the Radiologist Assistant, and communication documented in the PACS or zVision Dashboard. Electronically Signed   By: Markus Daft M.D.   On: 09/05/2015 11:58   Dg Abd Portable 1v  09/05/2015  CLINICAL DATA:  Nasogastric tube placement EXAM: PORTABLE ABDOMEN - 1 VIEW COMPARISON:  Study obtained earlier in the day FINDINGS: Nasogastric tube tip and side port are in the stomach. Visualized bowel remains mildly dilated. No free air evident. Lung bases are clear. IMPRESSION: Nasogastric tube tip and side port in stomach. Bowel dilatation remains. No free air evident. Lung bases clear. Electronically Signed   By: Lowella Grip III M.D.   On: 09/05/2015 17:23    Anti-infectives: Anti-infectives    None      Assessment/Plan:  Labs reviewed KUB has not yet been performed. He has good ileostomy output and his abdomen is soft and nontender. We'll review KUB and likely be able to at least clamp his nasogastric tube and try clear liquids today unless his KUB set shows something  very surprising at this point.  Florene Glen, MD, FACS  09/07/2015

## 2015-09-08 ENCOUNTER — Encounter: Payer: Self-pay | Admitting: Physician Assistant

## 2015-09-08 ENCOUNTER — Inpatient Hospital Stay (HOSPITAL_COMMUNITY)
Admit: 2015-09-08 | Discharge: 2015-09-08 | Disposition: A | Discharge status: Home / Self Care | Payer: Medicare Other | Attending: Physician Assistant | Admitting: Physician Assistant

## 2015-09-08 ENCOUNTER — Ambulatory Visit: Payer: Medicare Other | Admitting: Cardiovascular Disease

## 2015-09-08 DIAGNOSIS — F0150 Vascular dementia without behavioral disturbance: Secondary | ICD-10-CM | POA: Diagnosis present

## 2015-09-08 DIAGNOSIS — I4891 Unspecified atrial fibrillation: Secondary | ICD-10-CM

## 2015-09-08 DIAGNOSIS — R296 Repeated falls: Secondary | ICD-10-CM

## 2015-09-08 DIAGNOSIS — I495 Sick sinus syndrome: Secondary | ICD-10-CM | POA: Diagnosis not present

## 2015-09-08 DIAGNOSIS — I480 Paroxysmal atrial fibrillation: Secondary | ICD-10-CM

## 2015-09-08 LAB — ECHOCARDIOGRAM COMPLETE
AOPV: 0.67 m/s
AV Area VTI: 2.11 cm2
AV Peak grad: 2 mmHg
AVPKVEL: 78.7 cm/s
CHL CUP AV PEAK INDEX: 0.94
EWDT: 280 ms
FS: 29 % (ref 28–44)
Height: 70 in
IVS/LV PW RATIO, ED: 0.84
LA diam end sys: 36 cm
LA diam index: 1.6 cm/m2
LA vol A4C: 31.8 ml
LA vol index: 18.7 mL/m2
LASIZE: 36 cm
LAVOL: 42.1 cm3
LDCA: 3.14 cm2
LV PW d: 8.45 mm — AB (ref 0.6–1.1)
LVOTD: 20 mm
LVOTPV: 52.9 cm/s
MV Dec: 280
MV pk A vel: 66.5 m/s
MV pk E vel: 59.7 m/s
TAPSE: 20.9 cm
Weight: 3800 oz

## 2015-09-08 LAB — GLUCOSE, CAPILLARY
GLUCOSE-CAPILLARY: 242 mg/dL — AB (ref 65–99)
GLUCOSE-CAPILLARY: 245 mg/dL — AB (ref 65–99)
GLUCOSE-CAPILLARY: 253 mg/dL — AB (ref 65–99)
GLUCOSE-CAPILLARY: 290 mg/dL — AB (ref 65–99)
Glucose-Capillary: 274 mg/dL — ABNORMAL HIGH (ref 65–99)

## 2015-09-08 LAB — CBC
HCT: 39.4 % — ABNORMAL LOW (ref 40.0–52.0)
Hemoglobin: 13.4 g/dL (ref 13.0–18.0)
MCH: 29.6 pg (ref 26.0–34.0)
MCHC: 34 g/dL (ref 32.0–36.0)
MCV: 87.1 fL (ref 80.0–100.0)
Platelets: 322 10*3/uL (ref 150–440)
RBC: 4.53 MIL/uL (ref 4.40–5.90)
RDW: 12.9 % (ref 11.5–14.5)
WBC: 11.5 10*3/uL — AB (ref 3.8–10.6)

## 2015-09-08 LAB — MAGNESIUM: Magnesium: 2.6 mg/dL — ABNORMAL HIGH (ref 1.7–2.4)

## 2015-09-08 LAB — BASIC METABOLIC PANEL
ANION GAP: 14 (ref 5–15)
BUN: 95 mg/dL — AB (ref 6–20)
CALCIUM: 9.2 mg/dL (ref 8.9–10.3)
CO2: 23 mmol/L (ref 22–32)
Chloride: 95 mmol/L — ABNORMAL LOW (ref 101–111)
Creatinine, Ser: 2.76 mg/dL — ABNORMAL HIGH (ref 0.61–1.24)
GFR calc Af Amer: 24 mL/min — ABNORMAL LOW (ref >60)
GFR, EST NON AFRICAN AMERICAN: 21 mL/min — AB (ref >60)
GLUCOSE: 262 mg/dL — AB (ref 65–99)
Potassium: 3.9 mmol/L (ref 3.5–5.1)
SODIUM: 132 mmol/L — AB (ref 135–145)

## 2015-09-08 LAB — TSH: TSH: 1.851 u[IU]/mL (ref 0.350–4.500)

## 2015-09-08 NOTE — Progress Notes (Signed)
Inpatient Diabetes Program Recommendations  AACE/ADA: New Consensus Statement on Inpatient Glycemic Control (2015)  Target Ranges:  Prepandial:   less than 140 mg/dL      Peak postprandial:   less than 180 mg/dL (1-2 hours)      Critically ill patients:  140 - 180 mg/dL   Lab Results  Component Value Date   GLUCAP 253* 09/08/2015   HGBA1C 6.6* 09/05/2015    Review of Glycemic ControlResults for YOSHINOBU, MCCONAHY (MRN [id number]) as of 09/08/2015 10:50  Ref. Range 09/07/2015 11:38 09/07/2015 17:13 09/07/2015 23:57 09/08/2015 05:30 09/08/2015 06:01  Glucose-Capillary Latest Ref Range: 65-99 mg/dL 234 (H) 271 (H) 228 (H) 242 (H) 253 (H)    Diabetes history: Type 2 diabetes Outpatient Diabetes medications: NPH 70 units daily Current orders for Inpatient glycemic control:  Novolog sensitive q 6 hours  Inpatient Diabetes Program Recommendations:    Please consider adding low dose basal insulin while in the hospital.  May consider Levemir 15 units bid.  Also since patient is now on clear liquid diet, may consider changing Novolog correction to tid with meals and HS.    Thanks, Adah Perl, RN, BC-ADM Inpatient Diabetes Coordinator Pager [phone] (8a-5p)

## 2015-09-08 NOTE — Clinical Social Work Note (Signed)
Clinical Social Work Assessment  Patient Details  Name: [name] MRN: [id number] Date of Birth: 04-19-1940  Date of referral:  09/08/15               Reason for consult:  Discharge Planning                Permission sought to share information with:  Family Supports Permission granted to share information::  Yes, Verbal Permission Granted  Name::        Agency::     Relationship::   (Wife- Environmental education officer)  Sport and exercise psychologist Information:     Housing/Transportation Living arrangements for the past 2 months:  Single Family Home Source of Information:  Spouse Inez Catalina) Patient Interpreter Needed:  None Criminal Activity/Legal Involvement Pertinent to Current Situation/Hospitalization:  No - Comment as needed Significant Relationships:  Spouse Lives with:  Spouse Do you feel safe going back to the place where you live?  Yes Need for family participation in patient care:   Inez Catalina- Wife)  Care giving concerns:  Patient's wife feels that he'll benefit from SNF placement at discharge    Social Worker assessment / plan:  CSW was consulted by PT stating that patient would benefit from SNF placement. CSW met with patient and his wife- Inez Catalina at bedside. CSW introduced herself and her role. Per patient's wife- Inez Catalina she feels patient will benefit from SNF placement. Reported that she'd like patient to go to [facility]. Stated that patient has been to the facility in the past. CSW requested to also send patient to SNFs in [place] and [place]. Approval granted. FL2/ PASRR completed and faxed out. Contacted Heather- Admissions Coordinator at [facility] to request she look at patient's referral. Awaiting bed offers. CSW will continue to follow and assist.   Employment status:  Retired Nurse, adult PT Recommendations:  [place] / Referral to community resources:  [place]  Patient/Family's Response to care:  Patient's wife is  in agreement that patient will benefit from SNF placement at discharge. Preference [facility].   Patient/Family's Understanding of and Emotional Response to Diagnosis, Current Treatment, and Prognosis:  Patient's wife reports that she understands patient's diagnosis and understands that SNF placement will benefit him at discharge. Appreciative of CSW's assistance.   Emotional Assessment Appearance:  Appears stated age Attitude/Demeanor/Rapport:   (None) Affect (typically observed):  Calm, Pleasant Orientation:  Oriented to Self, Oriented to Place Alcohol / Substance use:  Not Applicable Psych involvement (Current and /or in the community):  No (Comment)  Discharge Needs  Concerns to be addressed:  Discharge Planning Concerns Readmission within the last 30 days:  No Current discharge risk:  Chronically ill Barriers to Discharge:  Continued Medical Work up   [employer], LCSW 09/08/2015, 5:14 PM

## 2015-09-08 NOTE — Progress Notes (Signed)
Patient had a 2 second pause and converted to a flutter. Dr. Posey Pronto notified. No orders received

## 2015-09-08 NOTE — Progress Notes (Signed)
[facility] [place] NAME: [name]    MR#:  [id number]  DATE OF BIRTH:  [birth year]  SUBJECTIVE:  Patient pulled NG tube out recommended to start clear liquid diet per surgery patient also went into atrial fibrillation and had a 2 second portion patient's daughter requesting cardiology eval  REVIEW OF SYSTEMS:    Review of Systems  Unable to perform ROS: dementia    Tolerating Diet:Nothing by mouth      DRUG ALLERGIES:   Allergies  Allergen Reactions  . Fentanyl Other (See Comments)    Delirium  . Hydromorphone Hcl Other (See Comments)    Delirium  . Lorazepam Other (See Comments)    Delirium  . Oxycodone-Acetaminophen Other (See Comments)    Delirium  . Lisinopril   . Vicodin [Hydrocodone-Acetaminophen]     VITALS:  Blood pressure 112/55, pulse 65, temperature 98 F (36.7 C), temperature source Oral, resp. rate 12, height 5\' 10"  (1.778 m), weight 107.729 kg (237 lb 8 oz), SpO2 93 %.  PHYSICAL EXAMINATION:   Physical Exam  Constitutional: He is well-developed, well-nourished, and in no distress. No distress.  HENT:  Head: Normocephalic.  Eyes: No scleral icterus.  Neck: Normal range of motion. Neck supple. No JVD present. No tracheal deviation present.  Cardiovascular: Irregular regular and normal heart sounds.  Exam reveals no gallop and no friction rub.   No murmur heard. Pulmonary/Chest: Effort normal and breath sounds normal. No respiratory distress. He has no wheezes. He has no rales. He exhibits no tenderness.  Abdominal: Soft. Diminished bowel sounds He exhibits no mass. Nontender There is no rebound and no guarding.  Ileostomy  Musculoskeletal: Normal range of motion. He exhibits no edema.  Neurological: He is alert.  Skin: Skin is warm. No rash noted. No erythema.  Psychiatric: Affect and judgment normal.      LABORATORY PANEL:   CBC  Recent Labs Lab 09/08/15 0630  WBC 11.5*  HGB 13.4  HCT  39.4*  PLT 322   ------------------------------------------------------------------------------------------------------------------  Chemistries   Recent Labs Lab 09/04/15 1910  09/08/15 0630  NA 129*  < > 132*  K 4.5  < > 3.9  CL 91*  < > 95*  CO2 23  < > 23  GLUCOSE 234*  < > 262*  BUN 63*  < > 95*  CREATININE 3.75*  < > 2.76*  CALCIUM 9.4  < > 9.2  MG  --   --  2.6*  AST 59*  --   --   ALT 54  --   --   ALKPHOS 55  --   --   BILITOT 1.6*  --   --   < > = values in this interval not displayed. ------------------------------------------------------------------------------------------------------------------  Cardiac Enzymes  Recent Labs Lab 09/04/15 1910  TROPONINI <0.03   ------------------------------------------------------------------------------------------------------------------  RADIOLOGY:  Ct Head Wo Contrast  09/06/2015  CLINICAL DATA:  Increased confusion EXAM: CT HEAD WITHOUT CONTRAST TECHNIQUE: Contiguous axial images were obtained from the base of the skull through the vertex without intravenous contrast. COMPARISON:  October 06, 2013 FINDINGS: There is moderate diffuse atrophy, stable. There is no intracranial mass, acute hemorrhage, extra-axial fluid collection, or midline shift. There is patchy small vessel disease throughout the centra semiovale bilaterally. There is evidence of a prior infarct involving the anterior limb of the left external capsule, stable. There is calcification along the gyri of the medial right April upper occipital and inferior right parietal surfaces,  a stable finding likely representing residua of amyloid angiography. There is no new calcification or new gray-white compartment lesion. No acute infarct is evident. The bony calvarium appears intact. The mastoid air cells clear. Visualized orbits appear symmetric bilaterally. IMPRESSION: Atrophy with small vessel disease in the periventricular white matter as well as prior infarct in the  anterior limb of the left external capsule. Calcification along the medial posterior gyri of the upper right occipital and inferior right parietal lobes, likely residua of amyloid angiography. No acute hemorrhage evident. No acute infarct evident. No mass effect or edema. Electronically Signed   By: Lowella Grip III M.D.   On: 09/06/2015 15:09   Dg Abd 2 Views  09/07/2015  CLINICAL DATA:  Abdominal pain, distention. History of small bowel obstruction. EXAM: ABDOMEN - 2 VIEW COMPARISON:  09/06/2015 FINDINGS: NG tube remains present in the stomach. Continued dilated small bowel loops in the abdomen and pelvis, not significantly changed since prior study compatible with small bowel obstruction. No free air organomegaly. IMPRESSION: Continued small bowel obstruction pattern. Electronically Signed   By: Rolm Baptise M.D.   On: 09/07/2015 09:25     ASSESSMENT AND PLAN:    75 year old male with a history of dementia who presents with diarrhea and found to have distal small bowel physician.  1. Distal small bowel obstruction:  We will see if patient can tolerate clear liquids ambulate  2. Diabetes: Continue sliding scale insulin blood sugars elevated we'll restart NPH  3. Essential hypertension: Blood pressure stable  4. Dementia: Continue Aricept.  5. Depression: Continue Paxil Continue when necessary Xanax for anxiety  6. Acute kidney injury on chronic kidney disease stage IV: Baseline creatinine is 2.5. Renal function close to baseline   7. Hyponatremia: Improved with IV fluids and continue to follow.   8. Paroxysmal atrial fibrillation patient back in atrial fibrillation explained to the family that this is nothing expected during illness. Heart rate is currently controlled. With his pauses will ask cardiology to evaluate Management plans discussed with the patient's wife and she is in agreement.  CODE STATUS: full  TOTAL TIME TAKING CARE OF THIS PATIENT: 25 minutes.      POSSIBLE D/C 2-3 days, DEPENDING ON CLINICAL CONDITION.   Dustin Flock M.D on 09/08/2015 at 1:30 PM  Between 7am to 6pm - Pager - [phone] After 6pm go to [email or web address] - password [email or web address] [facility]  Office  [phone]  CC: Primary care physician; [name], MD  Note: This dictation was prepared with Dragon dictation along with smaller phrase technology. Any transcriptional errors that result from this process are unintentional.

## 2015-09-08 NOTE — Progress Notes (Signed)
*  PRELIMINARY RESULTS* Echocardiogram 2D Echocardiogram has been performed.  [name] 09/08/2015, 3:02 PM

## 2015-09-08 NOTE — Care Management Important Message (Signed)
Important Message  Patient Details  Name: [name] MRN: [id number] Date of Birth: Jun 17, 1940   Medicare Important Message Given:  Yes- Daughter    Katrina Stack, RN 09/08/2015, 3:20 PM

## 2015-09-08 NOTE — Progress Notes (Signed)
[facility]  ROUNDING NOTE   Subjective:   Patient's wife is at bedside. Serum creatinine slightly lower at 2.76 compared to yesterday Patient continues to do poorly Did not interact much  Objective:  Vital signs in last 24 hours:  Temp:  [97.7 F (36.5 C)-98 F (36.7 C)] 98 F (36.7 C) (06/05 1233) Pulse Rate:  [65-74] 65 (06/05 1233) Resp:  [12-18] 12 (06/05 1233) BP: (111-131)/(52-72) 112/55 mmHg (06/05 1233) SpO2:  [92 %-95 %] 93 % (06/05 1233)  Weight change:  Filed Weights   09/04/15 1914 09/05/15 0305  Weight: 110.224 kg (243 lb) 107.729 kg (237 lb 8 oz)    Intake/Output: I/O last 3 completed shifts: In: 566.7 [I.V.:566.7] Out: 1700 [Urine:400; Stool:1300]   Intake/Output this shift:  Total I/O In: 760 [P.O.:760] Out: 825 [Stool:825]  Physical Exam: General: NAD, laying in bed  Head: Normocephalic, atraumatic. Moist oral mucosal membranes  Eyes: Anicteric,  Neck: Supple, trachea midline  Lungs:  Coarse breath sounds bilaterally, room air  Heart: No rub or gallop  Abdomen:  Obese,  +bowel sounds  Extremities:  no peripheral edema.  Neurologic: Lethargic, did not answer any questions  Skin: No lesions       Basic Metabolic Panel:  Recent Labs Lab 09/04/15 1910 09/05/15 0406 09/06/15 0456 09/07/15 0547 09/08/15 0630  NA 129* 133* 132* 133* 132*  K 4.5 5.1 4.3 4.2 3.9  CL 91* 91* 94* 97* 95*  CO2 23 25 20* 19* 23  GLUCOSE 234* 174* 161* 230* 262*  BUN 63* 67* 73* 93* 95*  CREATININE 3.75* 3.59* 3.11* 3.06* 2.76*  CALCIUM 9.4 9.4 9.4 9.2 9.2  MG  --   --   --   --  2.6*  PHOS  --   --   --  4.9*  --     Liver Function Tests:  Recent Labs Lab 09/04/15 1910 09/07/15 0547  AST 59*  --   ALT 54  --   ALKPHOS 55  --   BILITOT 1.6*  --   PROT 8.0  --   ALBUMIN 4.1 3.2*    Recent Labs Lab 09/04/15 1910  LIPASE 23   No results for input(s): AMMONIA in the last 168 hours.  CBC:  Recent Labs Lab 09/04/15 1910  09/05/15 0406 09/06/15 0456 09/08/15 0630  WBC 6.0 5.1 7.6 11.5*  NEUTROABS 4.0  --   --   --   HGB 13.7 13.0 13.5 13.4  HCT 39.7* 37.1* 39.3* 39.4*  MCV 88.5 88.6 89.4 87.1  PLT 328 285 322 322    Cardiac Enzymes:  Recent Labs Lab 09/04/15 1910  TROPONINI <0.03    BNP: Invalid input(s): POCBNP  CBG:  Recent Labs Lab 09/07/15 1713 09/07/15 2357 09/08/15 0530 09/08/15 0601 09/08/15 1227  GLUCAP 271* 228* 242* 253* 245*    Microbiology: Results for orders placed or performed during the hospital encounter of 10/26/11  Tissue culture     Status: None   Collection Time: 10/26/11  8:19 AM  Result Value Ref Range Status   Specimen Description TISSUE KNEE LEFT  Final   Special Requests SYNOVIUM LEFT KNEE NO 1  Final   Gram Stain   Final    NO WBC SEEN NO SQUAMOUS EPITHELIAL CELLS SEEN NO ORGANISMS SEEN   Culture NO GROWTH 3 DAYS  Final   Report Status 10/29/2011 FINAL  Final  Anaerobic culture     Status: None   Collection Time: 10/26/11  8:19 AM  Result Value Ref Range Status   Specimen Description TISSUE KNEE LEFT  Final   Special Requests SYNOVIUM LEFT KNEE NO 1  Final   Gram Stain NO WBC SEEN NO ORGANISMS SEEN  Final   Culture NO ANAEROBES ISOLATED  Final   Report Status 10/29/2011 FINAL  Final  Body fluid culture     Status: None   Collection Time: 10/26/11  8:22 AM  Result Value Ref Range Status   Specimen Description SYNOVIAL FLUID LEFT KNEE  Final   Special Requests FLUID ON SWAB SAMPLE NO 2  Final   Gram Stain NO WBC SEEN NO ORGANISMS SEEN  Final   Culture NO GROWTH 3 DAYS  Final   Report Status 10/29/2011 FINAL  Final  Anaerobic culture     Status: None   Collection Time: 10/26/11  8:22 AM  Result Value Ref Range Status   Specimen Description SYNOVIAL FLUID LEFT KNEE  Final   Special Requests FLUID ON SWAB SAMPLE NO 2  Final   Gram Stain NO WBC SEEN NO ORGANISMS SEEN  Final   Culture NO ANAEROBES ISOLATED  Final   Report Status 10/31/2011  FINAL  Final  Anaerobic culture     Status: None   Collection Time: 10/26/11  8:23 AM  Result Value Ref Range Status   Specimen Description TISSUE KNEE LEFT  Final   Special Requests TIBIAL TISSUE LEFT KNEE SAMPLE NO 3  Final   Gram Stain NO WBC SEEN NO ORGANISMS SEEN  Final   Culture NO ANAEROBES ISOLATED  Final   Report Status 10/29/2011 FINAL  Final  Tissue culture     Status: None   Collection Time: 10/26/11  8:23 AM  Result Value Ref Range Status   Specimen Description TISSUE KNEE LEFT  Final   Special Requests TIBIAL TISSUE LEFT KNEE SAMPLE NO 3  Final   Gram Stain   Final    NO WBC SEEN RARE SQUAMOUS EPITHELIAL CELLS PRESENT NO ORGANISMS SEEN   Culture NO GROWTH 3 DAYS  Final   Report Status 10/29/2011 FINAL  Final    Coagulation Studies: No results for input(s): LABPROT, INR in the last 72 hours.  Urinalysis: No results for input(s): COLORURINE, LABSPEC, PHURINE, GLUCOSEU, HGBUR, BILIRUBINUR, KETONESUR, PROTEINUR, UROBILINOGEN, NITRITE, LEUKOCYTESUR in the last 72 hours.  Invalid input(s): APPERANCEUR    Imaging: Dg Abd 2 Views  09/07/2015  CLINICAL DATA:  Abdominal pain, distention. History of small bowel obstruction. EXAM: ABDOMEN - 2 VIEW COMPARISON:  09/06/2015 FINDINGS: NG tube remains present in the stomach. Continued dilated small bowel loops in the abdomen and pelvis, not significantly changed since prior study compatible with small bowel obstruction. No free air organomegaly. IMPRESSION: Continued small bowel obstruction pattern. Electronically Signed   By: Rolm Baptise M.D.   On: 09/07/2015 09:25     Medications:   . sodium chloride 50 mL/hr at 09/07/15 0641   . aspirin EC  81 mg Oral Daily  . diltiazem  240 mg Oral Daily  . donepezil  10 mg Oral QHS  . heparin  5,000 Units Subcutaneous Q8H  . insulin aspart  0-9 Units Subcutaneous Q6H  . metoprolol tartrate  25 mg Oral BID  . pantoprazole  40 mg Oral Q1200  . PARoxetine  40 mg Oral Daily  .  rosuvastatin  20 mg Oral QHS   acetaminophen **OR** acetaminophen, albuterol, ALPRAZolam, ondansetron **OR** ondansetron (ZOFRAN) IV  Assessment/ Plan:  [name] is a 75 y.o. white  male [name] is a 75 y.o. white male with vascular dementia, COPD, hypertension, anemia, hyperlipidemia, insulin dependent diabetes mellitus, BPH, colon resection with ileostomy, bladder cancer, AAA, CVA, CAD, atrial fibrillation, congestive heart failrue, who was admitted to [facility] on 09/04/2015.   1. Acute renal failure on chronic kidney disease stage IV with proteinuria: Chronic kidney disease stage IV secondary to diabetes, vascular disease and hypertension. Baseline creatinine seems to be ~2.5. Follows with Dr. Florene Glen, [facility] in [place].  - Acute renal failure secondary to small bowel obstruction and prerenal azotemia.  - Continue gentle IV fluids - renally dose medications - Discussed with family. Family is not considering dialysis.   2. 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  - Continue glucose control.    LOS: 3 [name] 6/5/20173:02 PM

## 2015-09-08 NOTE — Consult Note (Signed)
Cardiology Consultation Note  Patient ID: [name], MRN: [id number], DOB/AGE: November 27, 1940 75 y.o. Admit date: 09/04/2015   Date of Consult: 09/08/2015 Primary Physician: [name], MD Primary Cardiologist: New to [facility] Requesting Physician: Dr. Posey Pronto, MD  Chief Complaint: Diarrhea and emesis Reason for Consult: PAF  HPI: 75 y.o. male with h/o reported CAD without prior stenting per patient's wife, PAF not on full-dose anticoagulation, chronic systolic CHF, history of syncope s/p prior IRL without evidence of arrhythmia or recurrent syncope s/p removal of ILR in 2010, rectal CA s/p colectomy, bladder CA, AAA s/p repair and grafting,  CKD stage III, DM2, UC, vascular dementia , and frequent falls, falling 5 times within the past 2 weeks who presented to [facility] on 6/1 with N/V for 5 days prior to admission and was found to have an SBO. Cardiology is consulted for rate controlled PAF.   Patient was in the process of getting re-established with Cardiology and had an appointment with Dr. Johnsie Cancel, MD on 09/08/15. His wife reports for the past 2 months he has been experiencing left upper chest pain that has not been associated with exertion. He also has been more fatigued as of late. He reportedly does not feel any episodes of Afib. Patient was last seen by cardiology in 2010 for syncope. He underwent ILR implantation that did not show evidence of arrhythmia, nor did he have any recurrent arrhythmia. His ILR was removed in 2010. Patient developed N/V 5 days prior to admission. Upon admission they were found to have SBO. Treated with NG tube now s/p clamping and self removal. Surgery not planning for any intervention. Has advanced to clear liquid diet. He was found to have acute on CKD stage III with peak SCr of 3.75--> no improved to 2.76. Potassium has been around 4.3 to 3.9 currently. Magnesium and TSH pending. WBC 7.6 on 6/3-->11.5 today. Patient developed rate controlled  Afib on 6/4 at 6:41 AM. Now back  in sinus rhythm.   History is taken from patient's wife.     Past Medical History  Diagnosis Date  . Gout   . Depression   . Diabetes mellitus   . Unspecified essential hypertension   . Hypercholesteremia     IIA  . Colitis, ulcerative ([place])     NOS  . Personal hx-rectal/anal malignancy   . Urinary incontinence   . Benign prostatic hypertrophy     S/p TURP   . H/O ETOH abuse 06/06/2011  . Uncontrolled secondary diabetes mellitus with stage 3 CKD (GFR 30-59) ([place]) 06/05/2011  . Pleurisy   . AAA (abdominal aortic aneurysm) ([place]) 1992    S/p repair and grafting  . History of alcohol abuse   . CAD (coronary artery disease)     a. patient's wife reports no prior stenting  . Stroke [facility])     75 yrs old  . Stones in the urinary tract   . GERD (gastroesophageal reflux disease)   . Normocytic anemia     Chronic  . PAF (paroxysmal atrial fibrillation) (HCC)     a. during hospitalization 06/2011 for sepsis; b. not on long term, full-dose anticoagulation; c. CHADS2VASc 8 (CHF, HTN, age x 2, DM, stroke x 2, vascular dz)  . Chronic systolic CHF (congestive heart failure) (HCC)     EF 40-45% by echo 06/2011  . 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 Colon cancer ([place])   . Chronic bronchitis ([place])     "used to get  it quite often; found out medicine was causing it"  . Arthritis     "left knee"  . Obesity   . Vascular dementia   . Frequent falls       Most Recent Cardiac Studies: Echo 06/16/2011: Study Conclusions  - Left ventricle: The cavity size was normal. Wall thickness was increased in a pattern of mild LVH. Systolic function was mildly to moderately reduced. The estimated ejection fraction was in the range of 40% to 45%. Difficult images but inferior and posterior walls may be hypokinetic. Features are consistent with a pseudonormal left ventricular filling pattern, with concomitant abnormal relaxation and increased filling pressure (grade 2 diastolic  dysfunction). - Aortic valve: Trileaflet; moderately calcified leaflets. There was no stenosis. - Mitral valve: Mild regurgitation. - Left atrium: The atrium was mildly dilated. - Right ventricle: The cavity size was normal. Systolic function was normal. - Right atrium: The atrium was mildly dilated. - Pulmonary arteries: No complete TR doppler jet so unable to estimate PA systolic pressure. - Inferior vena cava: The vessel was normal in size; the respirophasic diameter changes were in the normal range (= 50%); findings are consistent with normal central venous pressure. Impressions:  - Technically difficult study with poor acoustic windows. Normal LV size with mild LV hypertrophy. EF 40-45% with possible inferior and posterior hypokinesis but this study is difficult to evaluate for wall motion. Normal RV size and systolic function. No signficant valvular abnormalities. No evidence for endocarditis was seen but if clinical suspicion is high, would need TEE.   Surgical History:  Past Surgical History  Procedure Laterality Date  . Transurethral resection of prostate    . Abdominal aortic aneurysm repair  1992    "had 3 blockages; 3 aneurysms repaired at the same time"  . I&d knee with poly exchange  06/12/2011    Procedure: IRRIGATION AND DEBRIDEMENT KNEE WITH POLY EXCHANGE;  Surgeon: Yvette Rack., MD;  Location: [facility];  Service: Orthopedics;  Laterality: Left;  . Appendectomy    . Eye surgery    . Coronary angioplasty      1992  . Colonoscopy w/ polypectomy    . Cataract extraction w/ intraocular lens  implant, bilateral  ~ 2010  . Total knee arthroplasty  1990's    left  . Joint replacement      left knee  . Total knee revision  10/26/11    left  . Total knee revision  10/26/2011    Procedure: TOTAL KNEE REVISION;  Surgeon: Garald Balding, MD;  Location: [facility];  Service: Orthopedics;  Laterality: Left;  left total knee revision  . Angioplasty     . Bladder tumor excision  2002  . Compound fracture ankle  08/17/2003     Home Meds: Prior to Admission medications   Medication Sig Start Date End Date Taking? Authorizing Provider  albuterol (PROVENTIL HFA;VENTOLIN HFA) 108 (90 BASE) MCG/ACT inhaler Inhale 2 puffs into the lungs every 6 (six) hours as needed for wheezing or shortness of breath. Reported on 06/18/2015   Yes Historical Provider, MD  aspirin EC 81 MG tablet Take 81 mg by mouth daily.   Yes Historical Provider, MD  diltiazem (CARDIZEM CD) 240 MG 24 hr capsule Take 240 mg by mouth daily. 04/08/13  Yes Historical Provider, MD  donepezil (ARICEPT) 10 MG tablet Take 1 tablet (10 mg total) by mouth at bedtime. 01/30/15  Yes Kathrynn Ducking, MD  fenofibrate micronized (LOFIBRA) 134 MG capsule Take 1  capsule by mouth daily. 09/03/15  Yes Historical Provider, MD  Ferrous Sulfate 28 MG TABS Take 1 tablet by mouth daily.   Yes Historical Provider, MD  fluocinonide cream (LIDEX) [id number] % Apply 1 application topically daily as needed.  08/14/15  Yes Historical Provider, MD  fluticasone (FLONASE) 50 MCG/ACT nasal spray Place 1 spray into both nostrils 2 (two) times daily as needed for allergies.  08/22/15  Yes Historical Provider, MD  insulin NPH Human (HUMULIN N) 100 UNIT/ML injection Inject 0.7 mLs (70 Units total) into the skin daily before breakfast. 06/24/15  Yes Renato Shin, MD  metoprolol tartrate (LOPRESSOR) 25 MG tablet Take 25 mg by mouth 2 (two) times daily. 06/24/11  Yes Radene Gunning, NP  Multiple Vitamins-Minerals (CENTRUM SILVER) tablet Take 1 tablet by mouth daily.   Yes Historical Provider, MD  Omega-3 Fatty Acids (FISH OIL) 1200 MG CAPS Take 1,200 mg by mouth 2 (two) times daily.   Yes Historical Provider, MD  ondansetron (ZOFRAN) 4 MG tablet Take 4 mg by mouth every 8 (eight) hours as needed.  09/03/15  Yes Historical Provider, MD  pantoprazole (PROTONIX) 40 MG tablet Take 40 mg by mouth daily at 12 noon. 06/24/11  Yes Lezlie Octave Black,  NP  PARoxetine (PAXIL) 40 MG tablet Take 1 tablet by mouth daily. 07/14/15  Yes Historical Provider, MD  rosuvastatin (CRESTOR) 20 MG tablet Take 20 mg by mouth at bedtime.   Yes Historical Provider, MD    Inpatient Medications:  . aspirin EC  81 mg Oral Daily  . diltiazem  240 mg Oral Daily  . donepezil  10 mg Oral QHS  . heparin  5,000 Units Subcutaneous Q8H  . insulin aspart  0-9 Units Subcutaneous Q6H  . metoprolol tartrate  25 mg Oral BID  . pantoprazole  40 mg Oral Q1200  . PARoxetine  40 mg Oral Daily  . rosuvastatin  20 mg Oral QHS   . sodium chloride 50 mL/hr at 09/07/15 0641    Allergies:  Allergies  Allergen Reactions  . Fentanyl Other (See Comments)    Delirium  . Hydromorphone Hcl Other (See Comments)    Delirium  . Lorazepam Other (See Comments)    Delirium  . Oxycodone-Acetaminophen Other (See Comments)    Delirium  . Lisinopril   . Vicodin [Hydrocodone-Acetaminophen]     Social History   Social History  . Marital Status: Married    Spouse Name: N/A  . Number of Children: 3  . Years of Education: hs   Occupational History  . Retired    Social History Main Topics  . Smoking status: Former Smoker -- 2.00 packs/day for 12 years    Types: Cigarettes    Quit date: 06/04/1970  . Smokeless tobacco: Current User    Types: Chew  . Alcohol Use: No     Comment: Occasional  . Drug Use: No  . Sexual Activity: Not Currently    Birth Control/ Protection: None   Other Topics Concern  . Not on file   Social History Narrative   Married. Patient lives at home with his wife.   Patient is right handed.   Patient drinks 3 cups of caffeine per day.     Family History  Problem Relation Age of Onset  . Dementia Mother   . COPD Father   . Anesthesia problems Neg Hx   . Dementia Sister   . Cancer Brother   . Cancer Brother   . Dementia Brother   .  Diabetes Neg Hx      Review of Systems: Review of Systems  Unable to perform ROS: dementia     Labs: No results for input(s): CKTOTAL, CKMB, TROPONINI in the last 72 hours. Lab Results  Component Value Date   WBC 11.5* 09/08/2015   HGB 13.4 09/08/2015   HCT 39.4* 09/08/2015   MCV 87.1 09/08/2015   PLT 322 09/08/2015    Recent Labs Lab 09/04/15 1910  09/08/15 0630  NA 129*  < > 132*  K 4.5  < > 3.9  CL 91*  < > 95*  CO2 23  < > 23  BUN 63*  < > 95*  CREATININE 3.75*  < > 2.76*  CALCIUM 9.4  < > 9.2  PROT 8.0  --   --   BILITOT 1.6*  --   --   ALKPHOS 55  --   --   ALT 54  --   --   AST 59*  --   --   GLUCOSE 234*  < > 262*  < > = values in this interval not displayed. Lab Results  Component Value Date   CHOL 126 06/05/2011   HDL 58 06/05/2011   LDLCALC 55 06/05/2011   TRIG 65 06/05/2011   No results found for: DDIMER  Radiology/Studies:  Ct Abdomen Pelvis Wo Contrast  09/04/2015  CLINICAL DATA:  Nausea and vomiting. EXAM: CT ABDOMEN AND PELVIS WITHOUT CONTRAST TECHNIQUE: Multidetector CT imaging of the abdomen and pelvis was performed following the standard protocol without IV contrast. COMPARISON:  April 17, 2015 FINDINGS: Coronary artery calcifications are identified. Lung bases are otherwise normal. No free air or free fluid. There is a small bowel obstruction. There may be more than 1 transition point. There is a transition point in the right side of the pelvis on approximately axial image 65. After this point, the small bowel returns of normal caliber. More distally, the small bowel is decompressed extending into the ostomy. The patient is status post colectomy. The patient is status post cholecystectomy. The liver, spleen, pancreas, and adrenal glands are normal. The kidneys are unchanged with no acute abnormalities. 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 No adenopathy. The bladder is decompressed but otherwise unremarkable. No masses or adenopathy in the pelvis. The pelvis is stable. Visualized bones  are unremarkable. IMPRESSION: 1. Distal small bowel obstruction. 2. 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 Recommend follow-up by ultrasound in 3 years according did ACR consensus guidelines. Electronically Signed   By: Dorise Bullion III M.D   On: 09/04/2015 21:54   Dg Abd 1 View  09/05/2015  CLINICAL DATA:  NG tube placement EXAM: ABDOMEN - 1 VIEW COMPARISON:  09/05/2015 at 1020 hours FINDINGS: Enteric tube terminates in distal esophagus, just above the GE junction. Multiple mildly prominent/dilated loops of small bowel in the central abdomen, worrisome for possible small bowel obstruction. Surgical clips in the mid abdomen. Mild degenerative changes of the lumbar spine. IMPRESSION: Enteric tube terminates in the distal esophagus, just above the GE junction. Advancement is suggested. Electronically Signed   By: Julian Hy M.D.   On: 09/05/2015 16:19   Ct Head Wo Contrast  09/06/2015  CLINICAL DATA:  Increased confusion EXAM: CT HEAD WITHOUT CONTRAST TECHNIQUE: Contiguous axial images were obtained from the base of the skull through the vertex without intravenous contrast. COMPARISON:  October 06, 2013 FINDINGS: There is moderate diffuse atrophy, stable. There is no intracranial  mass, acute hemorrhage, extra-axial fluid collection, or midline shift. There is patchy small vessel disease throughout the centra semiovale bilaterally. There is evidence of a prior infarct involving the anterior limb of the left external capsule, stable. There is calcification along the gyri of the medial right April upper occipital and inferior right parietal surfaces, a stable finding likely representing residua of amyloid angiography. There is no new calcification or new gray-white compartment lesion. No acute infarct is evident. The bony calvarium appears intact. The mastoid air cells clear. Visualized orbits appear symmetric bilaterally. IMPRESSION: Atrophy with small vessel disease in the periventricular white  matter as well as prior infarct in the anterior limb of the left external capsule. Calcification along the medial posterior gyri of the upper right occipital and inferior right parietal lobes, likely residua of amyloid angiography. No acute hemorrhage evident. No acute infarct evident. No mass effect or edema. Electronically Signed   By: Lowella Grip III M.D.   On: 09/06/2015 15:09   Dg Chest Portable 1 View  09/04/2015  CLINICAL DATA:  Weakness. H/o stroke, afib, cardiomyopathy, and coronary artery disease. EXAM: PORTABLE CHEST 1 VIEW COMPARISON:  07/03/2013 FINDINGS: Cardiac silhouette is normal in size. No mediastinal or hilar masses or evidence of adenopathy. Clear lungs. Relative lucency in the upper lobes suggests emphysema. No pleural effusion or pneumothorax. Bony thorax is intact. IMPRESSION: No active disease. Electronically Signed   By: Lajean Manes M.D.   On: 09/04/2015 19:47   Dg Abd 2 Views  09/07/2015  CLINICAL DATA:  Abdominal pain, distention. History of small bowel obstruction. EXAM: ABDOMEN - 2 VIEW COMPARISON:  09/06/2015 FINDINGS: NG tube remains present in the stomach. Continued dilated small bowel loops in the abdomen and pelvis, not significantly changed since prior study compatible with small bowel obstruction. No free air organomegaly. IMPRESSION: Continued small bowel obstruction pattern. Electronically Signed   By: Rolm Baptise M.D.   On: 09/07/2015 09:25   Dg Abd 2 Views  09/06/2015  CLINICAL DATA:  Small bowel obstruction. EXAM: ABDOMEN - 2 VIEW COMPARISON:  09/05/2015 FINDINGS: Enteric tube projects over the stomach, unchanged. Multiple mildly dilated loops of small bowel remain throughout the abdomen, similar to the prior studies. No significant colonic gas is identified. No intraperitoneal free air identified. Abdominal surgical clips and vascular calcifications are noted. Visualized lung bases are grossly clear. IMPRESSION: Persistent small bowel dilatation consistent  with obstruction. Electronically Signed   By: Logan Bores M.D.   On: 09/06/2015 09:29   Dg Abd 2 Views  09/05/2015  CLINICAL DATA:  Evaluate for small bowel obstruction. Nausea, vomiting and diarrhea for 3 days. EXAM: ABDOMEN - 2 VIEW COMPARISON:  09/05/2015.  09/04/2015. FINDINGS: No evidence for free air on the upright view. Increased dilatation of small bowel loops. Largest small bowel loop measures up to 7 cm. Cannot exclude wall thickening involving a bowel loop in the left upper quadrant. Large phleboliths in the pelvis. Surgical clips in the abdomen. IMPRESSION: Increased small bowel dilatation. Findings are concerning for worsening small bowel obstruction. These results will be called to the ordering clinician or representative by the Radiologist Assistant, and communication documented in the PACS or zVision Dashboard. Electronically Signed   By: Markus Daft M.D.   On: 09/05/2015 11:58   Dg Abd Portable 1v  09/05/2015  CLINICAL DATA:  Nasogastric tube placement EXAM: PORTABLE ABDOMEN - 1 VIEW COMPARISON:  Study obtained earlier in the day FINDINGS: Nasogastric tube tip and side port are in the  stomach. Visualized bowel remains mildly dilated. No free air evident. Lung bases are clear. IMPRESSION: Nasogastric tube tip and side port in stomach. Bowel dilatation remains. No free air evident. Lung bases clear. Electronically Signed   By: Lowella Grip III M.D.   On: 09/05/2015 17:23   Dg Abd Portable 1v  09/05/2015  CLINICAL DATA:  NG tube placement adjustment. EXAM: PORTABLE ABDOMEN - 1 VIEW COMPARISON:  Yesterday at 2314 hour FINDINGS: The enteric tube has been retracted. In addition to the side port, the tip is also now in the distal esophagus. Persistent small bowel dilatation in the upper abdomen. IMPRESSION: Enteric tube is been retracted, with tip and side port are now in the distal esophagus. Advancement of at least 8 cm would result in optimal placement. Electronically Signed   By: Jeb Levering M.D.   On: 09/05/2015 02:51   Dg Abd Portable 1v  09/04/2015  CLINICAL DATA:  NG tube placement. EXAM: PORTABLE ABDOMEN - 1 VIEW COMPARISON:  CT earlier this day. FINDINGS: Tip of the enteric tube below the diaphragm in the stomach, the side port is just proximal to the gastroesophageal junction. Recommend advancement of at least 4 cm for optimal placement. Dilated small bowel loops in the included upper abdomen. IMPRESSION: Tip of the enteric tube in the stomach, side-port in the distal esophagus. Recommend advancement of at least 4 cm for optimal placement. Electronically Signed   By: Jeb Levering M.D.   On: 09/04/2015 23:46    EKG: Interpreted by me showed: 09/04/15 - NSR, 78 bpm, anterior, inferior, lateral TWI. EKG 09/08/15 - NSR, 71 bpm, anterior, inferior, alteral TWI Telemetry: Interpreted by me showed: rate controlled Afib at 6:41 on 09/07/2015. Currently in NSR.  Weights: Filed Weights   09/04/15 1914 09/05/15 0305  Weight: 243 lb (110.224 kg) 237 lb 8 oz (107.729 kg)     Physical Exam: Blood pressure 125/62, pulse 66, temperature 97.7 F (36.5 C), temperature source Oral, resp. rate 16, height 5\' 10"  (1.778 m), weight 237 lb 8 oz (107.729 kg), SpO2 92 %. Body mass index is 34.08 kg/(m^2). General: Well developed, well nourished, in no acute distress. Head: Normocephalic, atraumatic, sclera non-icteric, no xanthomas, nares are without discharge.  Neck: Negative for carotid bruits. JVD not elevated. Lungs: Clear bilaterally to auscultation without wheezes, rales, or rhonchi. Breathing is unlabored. Heart: RRR with S1 S2. No murmurs, rubs, or gallops appreciated. Abdomen: Soft, non-tender, non-distended with normoactive bowel sounds. No hepatomegaly. No rebound/guarding. No obvious abdominal masses. Msk:  Strength and tone appear normal for age. Extremities: No clubbing or cyanosis. No edema. Distal pedal pulses are 2+ and equal bilaterally. Neuro: Somnolent. No facial  asymmetry. No focal deficit. Moves all extremities spontaneously. Psych:  Somnolent.    Assessment and Plan:  Principal Problem:   Small bowel obstruction (HCC) Active Problems:   Essential hypertension   CKD (chronic kidney disease) stage 3, GFR 30-59 ml/min   Diabetes (HCC)   PAF (paroxysmal atrial fibrillation) (HCC)   BPH (benign prostatic hyperplasia)   GERD (gastroesophageal reflux disease)   Vascular dementia   Frequent falls    1. PAF: -Developed rate controlled Afib on 6/4 at 6:41 AM, now back in NSR -On Cardizem CD 240 mg daily and Lopressor 25 mg bid for rate control prior to cardiology seeing patient -Check echo to evaluate LVSF -If with cardiomyopathy would discontinue Cardizem and continue rate control with Lopressor titration  -Digoxin not an option given his CKD -Mg++ and TSH  pending -Not on long term, full-dose anticoagulation upon admission -CHADS2VASc at least 8 (CHF, HTN, age x 2, DM, stroke x 2, vascular disease) -Not a good long term, full-dose anticoagulation candidate given frequent falls, falling 5 times within the past 2 weeks -Continue aspirin at this time, though patient's wife is aware this is not effective stroke prevention   2. SBO: -Likely playing a role in the above -WBC elevated, per IM -NG tube out -Clears per IM/surgery  3. Acute on CKD stage III: -Renal on board  4. Reported CAD: -Patient's wife reports no prior stenting -No ischemic work up in "years" -Check echo as above -Aspirin, Lopressor  5. Chronic systolic CHF: -He does not appear to be volume overloaded at this time -Lopressor as above -Check echo as above  6. HTN: -Controlled -Continue current medications as above   7. Vascular dementia: -Per IM  8. Hyponatremia: -Improved -Per IM  9. Frequent falls: -PT to see   Signed, Christell Faith, PA-C [facility] Pager: [phone] 09/08/2015, 10:18 AM

## 2015-09-08 NOTE — Evaluation (Signed)
Physical Therapy Evaluation Patient Details Name: [name] MRN: [id number] DOB: 1940/11/14 Today's Date: 09/08/2015   History of Present Illness  [name] is a 75 y.o. male who presents with Abdominal pain with nausea and vomiting for the past 5 days. Patient went to his outpatient physician who thought he maybe had viral gastroenteritis. However, his pain persisted and he was brought to the ED for evaluation. Here he was found on imaging to have a small bowel instruction. Surgery was contacted and does not feel that he needs surgical intervention at this time. Hospitals were called for admission. Symptoms and obstruction appear to be improving at this time. Surgery considering diet for trial. Family not interested in pursuing surgery.   Clinical Impression  Pt with poor motivation and considerable weakness on this date. He requires heavy assist for bed mobility and transfers. ModA+2 for sit to stand after two attempts. Pt only able to remain standing for approximately 5 seconds due to LE weakness and buckling. Pt unable to ambulate at this time. Pt is mildly agitated during PT evaluation and appears slightly confused regarding situation. He will need SNF placement at discharge in order to regain strength to safely return home. Pt will benefit from skilled PT services to address deficits in strength, balance, and mobility in order to return to full function at home.     Follow Up Recommendations SNF    Equipment Recommendations  None recommended by PT    Recommendations for Other Services       Precautions / Restrictions Precautions Precautions: Fall Restrictions Weight Bearing Restrictions: No      Mobility  Bed Mobility Overal bed mobility: Needs Assistance;+2 for physical assistance Bed Mobility: Supine to Sit;Sit to Supine     Supine to sit: Mod assist;+2 for physical assistance Sit to supine: Mod assist;+2 for physical assistance   General bed mobility comments:  Pt with poor sequencing and significant strength deficits noted. Requires heavy asssit from therapist and tech for both rolling as well as transition from sidelying to sit. Pt requiring minA initially for sitting balance. Eventually able to maintain balance with CGA only. At end of session pt starts to fall backwards onto bed uncontrollably forcing therapist to support bodyweight back onto bed  Transfers Overall transfer level: Needs assistance Equipment used: Rolling walker (2 wheeled) Transfers: Sit to/from Stand Sit to Stand: Mod assist;+2 physical assistance         General transfer comment: Pt is very weak during transfers requiring 2 attempts to come to standing. First attempt is a failure with pt requiring maxA+2. Second attempt pt able to perform with modA+2 however he is unable to remain standing for longer than 5 seconds before knees buckle and pt falls back onto bed. Unsafe/unable to attempt ambulation at this time. Pt with heavy UE support in standing and poor balance  Ambulation/Gait             General Gait Details: Unable  Stairs            Wheelchair Mobility    Modified Rankin (Stroke Patients Only)       Balance Overall balance assessment: Needs assistance Sitting-balance support: No upper extremity supported Sitting balance-Leahy Scale: Fair     Standing balance support: Bilateral upper extremity supported Standing balance-Leahy Scale: Poor                               Pertinent Vitals/Pain Pain  Assessment: 0-10 Pain Location: Pt reporting headache, "a little bit." Does not rate on NPRS Pain Intervention(s): Monitored during session;Patient requesting pain meds-RN notified    Home Living Family/patient expects to be discharged to:: Private residence Living Arrangements: Spouse/significant other Available Help at Discharge: Family Type of Home: House Home Access: [place]: [address] [place]: [place]  - single point;Walker - 2 wheels;Tub bench;Hand held shower head;Grab bars - tub/shower;Wheelchair - Education officer, community - power      Prior Function Level of Independence: Needs assistance   Gait / Transfers Assistance Needed: Previously ambulating with single point cane full household distances, with weakening over the last week he has been using a rolling walker  ADL's / Homemaking Assistance Needed: Requiring assist from wife with ADLs/IADLs        Hand Dominance   Dominant Hand: Right    Extremity/Trunk Assessment   Upper Extremity Assessment: Generalized weakness           Lower Extremity Assessment: Generalized weakness         Communication   Communication: No difficulties  Cognition Arousal/Alertness: Lethargic Behavior During Therapy: Agitated Overall Cognitive Status: Within Functional Limits for tasks assessed       Memory: Decreased short-term memory (Pt is AOx2, orientation to year not month, partial situation)              General Comments      Exercises General Exercises - Lower Extremity Straight Leg Raises: Strengthening;Both;10 reps;Supine      Assessment/Plan    PT Assessment Patient needs continued PT services  PT Diagnosis Generalized weakness;Difficulty walking   PT Problem List Decreased strength;Decreased activity tolerance;Decreased balance;Decreased mobility;Decreased knowledge of use of DME;Decreased safety awareness;Obesity  PT Treatment Interventions DME instruction;Gait training;Functional mobility training;Therapeutic activities;Therapeutic exercise;Balance training;Neuromuscular re-education;Patient/family education   PT Goals (Current goals can be found in the Care Plan section) Acute Rehab PT Goals Patient Stated Goal: Return to prior function at home and regain strength PT Goal Formulation: With family Time For Goal Achievement: 09/22/15 Potential to Achieve Goals: Fair    Frequency Min 2X/week   Barriers to  discharge        Co-evaluation               End of Session Equipment Utilized During Treatment: Gait belt Activity Tolerance: Patient limited by lethargy Patient left: in bed;with call bell/phone within reach;with bed alarm set;Other (comment) (With respiratory present performing EKG) Nurse Communication: Need for lift equipment;Other (comment) (Lift transfers only written on board)         Time: [phone] PT Time Calculation (min) (ACUTE ONLY): 30 min   Charges:   PT Evaluation $PT Eval Moderate Complexity: 1 Procedure     PT G Codes:       Lyndel Safe Keaden Gunnoe PT, DPT   Kaylianna Detert 09/08/2015, 9:33 AM

## 2015-09-08 NOTE — Progress Notes (Signed)
Patient ID: [name], male   DOB: April 09, 1940, 75 y.o.   MRN: [id number] No complaints from pt. Pt' wife present and reports he is doing ok. Pulled his ngt out last pm. Ileostomy with a lot of air and liquid stool. Abdomen is soft, active bowel sounds.  At present he does not appear to have a bowel obstruction. Trial of clear liq diet today

## 2015-09-08 NOTE — NC FL2 (Signed)
[place] LEVEL OF CARE SCREENING TOOL     IDENTIFICATION  Patient Name: [name] Birthdate: 07/10/1940 Sex: male Admission Date (Current Location): 09/04/2015  [place] and [place] Number:  Engineering geologist and Address:  [facility], [address] [place], [place] [address]      Provider Number: [id number]  Attending Physician Name and Address:  Dustin Flock, MD  Relative Name and Phone Number:       Current Level of Care: Hospital Recommended Level of Care: [place] Prior Approval Number:    Date Approved/Denied:   PASRR Number:  ([id number])  Discharge Plan: SNF    Current Diagnoses: Patient Active Problem List   Diagnosis Date Noted  . PAF (paroxysmal atrial fibrillation) ([place]) 09/08/2015  . Vascular dementia   . Frequent falls   . Small bowel obstruction ([place])   . GERD (gastroesophageal reflux disease) 09/04/2015  . Diabetes ([place]) 06/19/2015  . Abnormality of gait 06/26/2014  . Memory disorder 04/23/2013  . Acute renal failure ([place]) 10/27/2011  . Staphylococcus aureus bacteremia 06/14/2011  . Infection of prosthetic left knee joint ([place]) 06/12/2011  . CKD (chronic kidney disease) stage 3, GFR 30-59 ml/min 06/12/2011  . Morbid obesity ([place]) 06/12/2011  . Hyponatremia 06/12/2011  . H/O ETOH abuse 06/06/2011  . Hyperkalemia 06/05/2011  . Normocytic anemia 06/05/2011  . SYNCOPE AND COLLAPSE 07/18/2008  . HYPERCHOLESTEROLEMIA  IIA 07/16/2008  . GOUT 07/16/2008  . ANEMIA, NORMOCYTIC, CHRONIC 07/16/2008  . DEPRESSION 07/16/2008  . Essential hypertension 07/16/2008  . COLITIS, ULCERATIVE NOS 05/25/2006  . BPH (benign prostatic hyperplasia) 05/25/2006  . URINARY INCONTINENCE 05/25/2006  . HX, PERSONAL, MALIGNANCY, RECTUM/ANUS 05/25/2006    Orientation RESPIRATION BLADDER Height & Weight     Self, Place  Normal Continent Weight: 237 lb 8 oz (107.729 kg) Height:  5\' 10"  (177.8 cm)   BEHAVIORAL SYMPTOMS/MOOD NEUROLOGICAL BOWEL NUTRITION STATUS   (None)   Continent Diet (Clear Liquids)  AMBULATORY STATUS COMMUNICATION OF NEEDS Skin   Extensive Assist Verbally Normal                       Personal Care Assistance Level of Assistance  Bathing, Feeding, Dressing Bathing Assistance: Limited assistance Feeding assistance: Independent Dressing Assistance: Limited assistance     Functional Limitations Info  Sight, Hearing, Speech Sight Info: Adequate Hearing Info: Adequate Speech Info: Adequate    SPECIAL CARE FACTORS FREQUENCY  PT (By licensed PT)     PT Frequency:  (5)              Contractures      Additional Factors Info  Code Status, Allergies, Insulin Sliding Scale Code Status Info:  (DNR) Allergies Info:  (Fentanyl, Hydromorphone Hcl, Lorazepam, Oxycodone-acetaminophen, Lisinopril, Vicodin)   Insulin Sliding Scale Info:  (insulin aspart (novoLOG) injection 0-9 Units 0-9 Units, Subcutaneous, Every 6 hours ; .)       Current Medications (09/08/2015):  This is the current hospital active medication list Current Facility-Administered Medications  Medication Dose Route Frequency Provider Last Rate Last Dose  . 0.9 %  sodium chloride infusion   Intravenous Continuous Bettey Costa, MD 50 mL/hr at 09/07/15 0641    . acetaminophen (TYLENOL) tablet 650 mg  650 mg Oral Q6H PRN Lance Coon, MD   650 mg at 09/08/15 0947   Or  . acetaminophen (TYLENOL) suppository 650 mg  650 mg Rectal Q6H PRN Lance Coon, MD      .  albuterol (PROVENTIL) (2.5 MG/3ML) 0.083% nebulizer solution 3 mL  3 mL Inhalation Q6H PRN Lance Coon, MD      . ALPRAZolam Duanne Moron) tablet 0.25 mg  0.25 mg Oral TID PRN Dustin Flock, MD      . aspirin EC tablet 81 mg  81 mg Oral Daily Lance Coon, MD   81 mg at 09/08/15 0947  . diltiazem (CARDIZEM CD) 24 hr capsule 240 mg  240 mg Oral Daily Lance Coon, MD   240 mg at 09/08/15 0947  . donepezil (ARICEPT) tablet 10 mg  10 mg Oral QHS  Lance Coon, MD   10 mg at 09/07/15 2052  . heparin injection 5,000 Units  5,000 Units Subcutaneous Q8H Lance Coon, MD   5,000 Units at 09/08/15 1400  . insulin aspart (novoLOG) injection 0-9 Units  0-9 Units Subcutaneous Q6H Pernell Dupre, RPH   3 Units at 09/08/15 1236  . metoprolol tartrate (LOPRESSOR) tablet 25 mg  25 mg Oral BID Lance Coon, MD   25 mg at 09/08/15 0947  . ondansetron (ZOFRAN) tablet 4 mg  4 mg Oral Q6H PRN Lance Coon, MD       Or  . ondansetron [facility]) injection 4 mg  4 mg Intravenous Q6H PRN Lance Coon, MD      . pantoprazole (PROTONIX) EC tablet 40 mg  40 mg Oral Q1200 Lance Coon, MD   40 mg at 09/08/15 1200  . PARoxetine (PAXIL) tablet 40 mg  40 mg Oral Daily Lance Coon, MD   40 mg at 09/08/15 0947  . rosuvastatin (CRESTOR) tablet 20 mg  20 mg Oral QHS Lance Coon, MD   20 mg at 09/07/15 2052     Discharge Medications: Please see discharge summary for a list of discharge medications.  Relevant Imaging Results:  Relevant Lab Results:   Additional Information  (SSN [id number])  Lorenso Quarry Neala Miggins, LCSW

## 2015-09-09 DIAGNOSIS — I10 Essential (primary) hypertension: Secondary | ICD-10-CM

## 2015-09-09 LAB — GLUCOSE, CAPILLARY
GLUCOSE-CAPILLARY: 274 mg/dL — AB (ref 65–99)
GLUCOSE-CAPILLARY: 261 mg/dL — AB (ref 65–99)
GLUCOSE-CAPILLARY: 242 mg/dL — AB (ref 65–99)
Glucose-Capillary: 210 mg/dL — ABNORMAL HIGH (ref 65–99)
Glucose-Capillary: 237 mg/dL — ABNORMAL HIGH (ref 65–99)

## 2015-09-09 MED ORDER — INSULIN DETEMIR 100 UNIT/ML ~~LOC~~ SOLN
10.0000 [IU] | Freq: Every day | SUBCUTANEOUS | Status: DC
  Administered 2015-09-09 – 2015-09-11 (×3): 10 [IU] via SUBCUTANEOUS
  Filled 2015-09-09 (×3): qty 0.1

## 2015-09-09 MED ORDER — INSULIN ASPART 100 UNIT/ML ~~LOC~~ SOLN
0.0000 [IU] | Freq: Three times a day (TID) | SUBCUTANEOUS | Status: DC
  Administered 2015-09-09 – 2015-09-11 (×5): 3 [IU] via SUBCUTANEOUS
  Filled 2015-09-09: qty 3
  Filled 2015-09-09: qty 5
  Filled 2015-09-09 (×3): qty 3

## 2015-09-09 MED ORDER — MODAFINIL 100 MG PO TABS
100.0000 mg | ORAL_TABLET | Freq: Every day | ORAL | Status: DC
  Administered 2015-09-09 – 2015-09-11 (×3): 100 mg via ORAL
  Filled 2015-09-09 (×3): qty 1

## 2015-09-09 MED ORDER — INSULIN ASPART 100 UNIT/ML ~~LOC~~ SOLN
0.0000 [IU] | Freq: Every day | SUBCUTANEOUS | Status: DC
  Administered 2015-09-09: 2 [IU] via SUBCUTANEOUS
  Filled 2015-09-09: qty 2

## 2015-09-09 NOTE — Progress Notes (Signed)
CSW met with patient and his wife- Inez Catalina at bedside. Presented bed offers. Accepted bed at [facility]. CSW informed Heather- Admissions Coordinator at [facility]. CSW will continue to follow and assist.   Ernest Pine, MSW, [place] Work Department [phone]

## 2015-09-09 NOTE — Progress Notes (Signed)
AVSS. No complaints. Little po today.( 1500 cc yesterday).  Denies pain, nausea, vomiting. RN reports no complaints. Lungs: Clear. Cardio: RR. ABD: Obese,soft, non-tender. Ileostomy functioning well. BS pronounced with some rushes and tinkling. Extrem: Soft. Labs: Mild rise in WBC, BS high in the mid 200's. IMP: Good stoma output but poor po and clinical exam suggests persistent PSBO. Plan: Portable KUB ordered for AM.

## 2015-09-09 NOTE — Progress Notes (Signed)
Patient: [name] / Admit Date: 09/04/2015 / Date of Encounter: 09/09/2015, 11:36 AM   Subjective: No acute overnight events. Echo showed normal LVSF, mild concentric LVH. Heart rate remains well controlled on metoprolol and diltiazem.   Review of Systems: Review of Systems  Unable to perform ROS: dementia    Objective: Telemetry: Afib, 80's bpm currently, 60's-80's over the past 24 hours Physical Exam: Blood pressure 124/60, pulse 79, temperature 98.3 F (36.8 C), temperature source Axillary, resp. rate 20, height 5\' 10"  (1.778 m), weight 237 lb 8 oz (107.729 kg), SpO2 93 %. Body mass index is 34.08 kg/(m^2). General: Well developed, well nourished, in no acute distress, ill appearing. Head: Normocephalic, atraumatic, sclera non-icteric, no xanthomas, nares are without discharge. Neck: Negative for carotid bruits. JVP not elevated. Lungs: Clear bilaterally to auscultation without wheezes, rales, or rhonchi. Breathing is unlabored. Heart: Irregularly-irregular, S1 S2 without murmurs, rubs, or gallops.  Abdomen: Soft, non-tender, non-distended with normoactive bowel sounds. No rebound/guarding. Extremities: No clubbing or cyanosis. No edema. Distal pedal pulses are 2+ and equal bilaterally. Neuro: Somnolent. Moves all extremities spontaneously. Psych:  Somnolent.   Intake/Output Summary (Last 24 hours) at 09/09/15 1136 Last data filed at 09/09/15 1016  Gross per 24 hour  Intake   1520 ml  Output   1200 ml  Net    320 ml    Inpatient Medications:  . aspirin EC  81 mg Oral Daily  . diltiazem  240 mg Oral Daily  . donepezil  10 mg Oral QHS  . heparin  5,000 Units Subcutaneous Q8H  . insulin aspart  0-9 Units Subcutaneous Q6H  . metoprolol tartrate  25 mg Oral BID  . pantoprazole  40 mg Oral Q1200  . PARoxetine  40 mg Oral Daily  . rosuvastatin  20 mg Oral QHS   Infusions:  . sodium chloride 50 mL/hr at 09/08/15 2104    Labs:  Recent Labs  09/07/15 0547  09/08/15 0630  NA 133* 132*  K 4.2 3.9  CL 97* 95*  CO2 19* 23  GLUCOSE 230* 262*  BUN 93* 95*  CREATININE 3.06* 2.76*  CALCIUM 9.2 9.2  MG  --  2.6*  PHOS 4.9*  --     Recent Labs  09/07/15 0547  ALBUMIN 3.2*    Recent Labs  09/08/15 0630  WBC 11.5*  HGB 13.4  HCT 39.4*  MCV 87.1  PLT 322   No results for input(s): CKTOTAL, CKMB, TROPONINI in the last 72 hours. Invalid input(s): POCBNP No results for input(s): HGBA1C in the last 72 hours.   Weights: Filed Weights   09/04/15 1914 09/05/15 0305  Weight: 243 lb (110.224 kg) 237 lb 8 oz (107.729 kg)     Radiology/Studies:  Ct Abdomen Pelvis Wo Contrast  09/04/2015  CLINICAL DATA:  Nausea and vomiting. EXAM: CT ABDOMEN AND PELVIS WITHOUT CONTRAST TECHNIQUE: Multidetector CT imaging of the abdomen and pelvis was performed following the standard protocol without IV contrast. COMPARISON:  April 17, 2015 FINDINGS: Coronary artery calcifications are identified. Lung bases are otherwise normal. No free air or free fluid. There is a small bowel obstruction. There may be more than 1 transition point. There is a transition point in the right side of the pelvis on approximately axial image 65. After this point, the small bowel returns of normal caliber. More distally, the small bowel is decompressed extending into the ostomy. The patient is status post colectomy. The patient is status post cholecystectomy. The  liver, spleen, pancreas, and adrenal glands are normal. The kidneys are unchanged with no acute abnormalities. 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 No adenopathy. The bladder is decompressed but otherwise unremarkable. No masses or adenopathy in the pelvis. The pelvis is stable. Visualized bones are unremarkable. IMPRESSION: 1. Distal small bowel obstruction. 2. 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 Recommend follow-up by ultrasound in 3 years according did ACR  consensus guidelines. Electronically Signed   By: Dorise Bullion III M.D   On: 09/04/2015 21:54   Dg Abd 1 View  09/05/2015  CLINICAL DATA:  NG tube placement EXAM: ABDOMEN - 1 VIEW COMPARISON:  09/05/2015 at 1020 hours FINDINGS: Enteric tube terminates in distal esophagus, just above the GE junction. Multiple mildly prominent/dilated loops of small bowel in the central abdomen, worrisome for possible small bowel obstruction. Surgical clips in the mid abdomen. Mild degenerative changes of the lumbar spine. IMPRESSION: Enteric tube terminates in the distal esophagus, just above the GE junction. Advancement is suggested. Electronically Signed   By: Julian Hy M.D.   On: 09/05/2015 16:19   Ct Head Wo Contrast  09/06/2015  CLINICAL DATA:  Increased confusion EXAM: CT HEAD WITHOUT CONTRAST TECHNIQUE: Contiguous axial images were obtained from the base of the skull through the vertex without intravenous contrast. COMPARISON:  October 06, 2013 FINDINGS: There is moderate diffuse atrophy, stable. There is no intracranial mass, acute hemorrhage, extra-axial fluid collection, or midline shift. There is patchy small vessel disease throughout the centra semiovale bilaterally. There is evidence of a prior infarct involving the anterior limb of the left external capsule, stable. There is calcification along the gyri of the medial right April upper occipital and inferior right parietal surfaces, a stable finding likely representing residua of amyloid angiography. There is no new calcification or new gray-white compartment lesion. No acute infarct is evident. The bony calvarium appears intact. The mastoid air cells clear. Visualized orbits appear symmetric bilaterally. IMPRESSION: Atrophy with small vessel disease in the periventricular white matter as well as prior infarct in the anterior limb of the left external capsule. Calcification along the medial posterior gyri of the upper right occipital and inferior right  parietal lobes, likely residua of amyloid angiography. No acute hemorrhage evident. No acute infarct evident. No mass effect or edema. Electronically Signed   By: Lowella Grip III M.D.   On: 09/06/2015 15:09   Dg Chest Portable 1 View  09/04/2015  CLINICAL DATA:  Weakness. H/o stroke, afib, cardiomyopathy, and coronary artery disease. EXAM: PORTABLE CHEST 1 VIEW COMPARISON:  07/03/2013 FINDINGS: Cardiac silhouette is normal in size. No mediastinal or hilar masses or evidence of adenopathy. Clear lungs. Relative lucency in the upper lobes suggests emphysema. No pleural effusion or pneumothorax. Bony thorax is intact. IMPRESSION: No active disease. Electronically Signed   By: Lajean Manes M.D.   On: 09/04/2015 19:47   Dg Abd 2 Views  09/07/2015  CLINICAL DATA:  Abdominal pain, distention. History of small bowel obstruction. EXAM: ABDOMEN - 2 VIEW COMPARISON:  09/06/2015 FINDINGS: NG tube remains present in the stomach. Continued dilated small bowel loops in the abdomen and pelvis, not significantly changed since prior study compatible with small bowel obstruction. No free air organomegaly. IMPRESSION: Continued small bowel obstruction pattern. Electronically Signed   By: Rolm Baptise M.D.   On: 09/07/2015 09:25   Dg Abd 2 Views  09/06/2015  CLINICAL DATA:  Small bowel obstruction. EXAM: ABDOMEN - 2  VIEW COMPARISON:  09/05/2015 FINDINGS: Enteric tube projects over the stomach, unchanged. Multiple mildly dilated loops of small bowel remain throughout the abdomen, similar to the prior studies. No significant colonic gas is identified. No intraperitoneal free air identified. Abdominal surgical clips and vascular calcifications are noted. Visualized lung bases are grossly clear. IMPRESSION: Persistent small bowel dilatation consistent with obstruction. Electronically Signed   By: Logan Bores M.D.   On: 09/06/2015 09:29   Dg Abd 2 Views  09/05/2015  CLINICAL DATA:  Evaluate for small bowel obstruction.  Nausea, vomiting and diarrhea for 3 days. EXAM: ABDOMEN - 2 VIEW COMPARISON:  09/05/2015.  09/04/2015. FINDINGS: No evidence for free air on the upright view. Increased dilatation of small bowel loops. Largest small bowel loop measures up to 7 cm. Cannot exclude wall thickening involving a bowel loop in the left upper quadrant. Large phleboliths in the pelvis. Surgical clips in the abdomen. IMPRESSION: Increased small bowel dilatation. Findings are concerning for worsening small bowel obstruction. These results will be called to the ordering clinician or representative by the Radiologist Assistant, and communication documented in the PACS or zVision Dashboard. Electronically Signed   By: Markus Daft M.D.   On: 09/05/2015 11:58   Dg Abd Portable 1v  09/05/2015  CLINICAL DATA:  Nasogastric tube placement EXAM: PORTABLE ABDOMEN - 1 VIEW COMPARISON:  Study obtained earlier in the day FINDINGS: Nasogastric tube tip and side port are in the stomach. Visualized bowel remains mildly dilated. No free air evident. Lung bases are clear. IMPRESSION: Nasogastric tube tip and side port in stomach. Bowel dilatation remains. No free air evident. Lung bases clear. Electronically Signed   By: Lowella Grip III M.D.   On: 09/05/2015 17:23   Dg Abd Portable 1v  09/05/2015  CLINICAL DATA:  NG tube placement adjustment. EXAM: PORTABLE ABDOMEN - 1 VIEW COMPARISON:  Yesterday at 2314 hour FINDINGS: The enteric tube has been retracted. In addition to the side port, the tip is also now in the distal esophagus. Persistent small bowel dilatation in the upper abdomen. IMPRESSION: Enteric tube is been retracted, with tip and side port are now in the distal esophagus. Advancement of at least 8 cm would result in optimal placement. Electronically Signed   By: Jeb Levering M.D.   On: 09/05/2015 02:51   Dg Abd Portable 1v  09/04/2015  CLINICAL DATA:  NG tube placement. EXAM: PORTABLE ABDOMEN - 1 VIEW COMPARISON:  CT earlier this day.  FINDINGS: Tip of the enteric tube below the diaphragm in the stomach, the side port is just proximal to the gastroesophageal junction. Recommend advancement of at least 4 cm for optimal placement. Dilated small bowel loops in the included upper abdomen. IMPRESSION: Tip of the enteric tube in the stomach, side-port in the distal esophagus. Recommend advancement of at least 4 cm for optimal placement. Electronically Signed   By: Jeb Levering M.D.   On: 09/04/2015 23:46     Assessment and Plan  Principal Problem:   Small bowel obstruction (HCC) Active Problems:   Essential hypertension   CKD (chronic kidney disease) stage 3, GFR 30-59 ml/min   Diabetes (HCC)   PAF (paroxysmal atrial fibrillation) (HCC)   BPH (benign prostatic hyperplasia)   GERD (gastroesophageal reflux disease)   Vascular dementia   Frequent falls    1. PAF: -Developed rate controlled Afib on 6/4 at 6:41 AM, now back in NSR -On Cardizem CD 240 mg daily and Lopressor 25 mg bid for rate control prior  to cardiology seeing patient -Echo showed EF 50-55%, mild concentric LVH, not technically sufficient to evaluate LV diastolic function -Continue Cardizem and continue rate control with Lopressor titration as needed -Rate well controlled at this time -Digoxin not an option given his CKD -Not on long term, full-dose anticoagulation upon admission -CHADS2VASc at least 8 (CHF, HTN, age x 2, DM, stroke x 2, vascular disease) -Not a good long term, full-dose anticoagulation candidate given frequent falls, falling 5 times within the past 2 weeks -Should his balance improve would want to strongly reconsider full-dose anticoagulation  -Continue aspirin at this time, though patient's wife is aware this is not effective stroke prevention   2. SBO: -Likely playing a role in the above -WBC elevated on 6/5, per IM -NG tube out -Clears per IM/surgery  3. Acute on CKD stage III: -Renal on board  4. Reported CAD: -Patient's wife  reports no prior stenting -No ischemic work up in "years" -Echo as above -Aspirin, Lopressor  5. Chronic systolic CHF: -He does not appear to be volume overloaded at this time -Lopressor as above -Check echo as above  6. HTN: -Controlled -Continue current medications as above   7. Vascular dementia: -Per IM  8. Hyponatremia: -Improved -Per IM  9. Frequent falls: -PT to see   Signed, Christell Faith, PA-C [place] Pager: [phone] 09/09/2015, 11:36 AM

## 2015-09-09 NOTE — Progress Notes (Deleted)
CSW received a telephone call for patient's legal DSS guardinan- Marga Melnick. Presented bed offers. Per Zigmund Daniel she is not ready to make a decision on discharge plans until patient's plan of care is clear. Reported she'd like to hear from the MD to discuss options. CSW will continue to follow and assist.   Ernest Pine, MSW, [place] Work Department [phone]

## 2015-09-09 NOTE — Progress Notes (Signed)
[name] at [place] NAME: [name]    MR#:  [id number]  DATE OF BIRTH:  06/27/1940  SUBJECTIVE:  Patient still very sleepy and not able to take much by mouth intake wife at bedside  REVIEW OF SYSTEMS:    Review of Systems  Unable to perform ROS: dementia    Tolerating Diet:Nothing by mouth      DRUG ALLERGIES:   Allergies  Allergen Reactions  . Fentanyl Other (See Comments)    Delirium  . Hydromorphone Hcl Other (See Comments)    Delirium  . Lorazepam Other (See Comments)    Delirium  . Oxycodone-Acetaminophen Other (See Comments)    Delirium  . Lisinopril   . Vicodin [Hydrocodone-Acetaminophen]     VITALS:  Blood pressure 124/60, pulse 79, temperature 98.3 F (36.8 C), temperature source Axillary, resp. rate 20, height 5\' 10"  (1.778 m), weight 107.729 kg (237 lb 8 oz), SpO2 93 %.  PHYSICAL EXAMINATION:   Physical Exam  Constitutional: He is well-developed, well-nourished, and in no distress. No distress.  HENT:  Head: Normocephalic.  Eyes: No scleral icterus.  Neck: Normal range of motion. Neck supple. No JVD present. No tracheal deviation present.  Cardiovascular: Irregular regular and normal heart sounds.  Exam reveals no gallop and no friction rub.   No murmur heard. Pulmonary/Chest: Effort normal and breath sounds normal. No respiratory distress. He has no wheezes. He has no rales. He exhibits no tenderness.  Abdominal: Soft. Diminished bowel sounds He exhibits no mass. Nontender There is no rebound and no guarding.  Ileostomy  Musculoskeletal: Normal range of motion. He exhibits no edema.  Neurological: Sleepy Skin: Skin is warm. No rash noted. No erythema.  Psychiatric: Affect and judgment normal.      LABORATORY PANEL:   CBC  Recent Labs Lab 09/08/15 0630  WBC 11.5*  HGB 13.4  HCT 39.4*  PLT 322    ------------------------------------------------------------------------------------------------------------------  Chemistries   Recent Labs Lab 09/04/15 1910  09/08/15 0630  NA 129*  < > 132*  K 4.5  < > 3.9  CL 91*  < > 95*  CO2 23  < > 23  GLUCOSE 234*  < > 262*  BUN 63*  < > 95*  CREATININE 3.75*  < > 2.76*  CALCIUM 9.4  < > 9.2  MG  --   --  2.6*  AST 59*  --   --   ALT 54  --   --   ALKPHOS 55  --   --   BILITOT 1.6*  --   --   < > = values in this interval not displayed. ------------------------------------------------------------------------------------------------------------------  Cardiac Enzymes  Recent Labs Lab 09/04/15 1910  TROPONINI <0.03   ------------------------------------------------------------------------------------------------------------------  RADIOLOGY:  No results found.   ASSESSMENT AND PLAN:    75 year old male with a history of dementia who presents with diarrhea and found to have distal small bowel physician.  1. Distal small bowel obstruction:  Try to advance diet  2. Diabetes: Continue sliding scale insulin blood sugars elevated patient will be started on Levemir  3. Essential hypertension: Blood pressure stable  4. Dementia: Continue Aricept.  5. Depression: Continue Paxil Continue when necessary Xanax for anxiety  6. Acute kidney injury on chronic kidney disease stage IV: Baseline creatinine is 2.5. Renal function close to baseline   7. Hyponatremia: Improved with IV fluids and continue to follow.   8. Paroxysmal atrial fibrillation continue Coreg  Management plans  discussed with the patient's wife and she is in agreement.  CODE STATUS: full  TOTAL TIME TAKING CARE OF THIS PATIENT: 25 minutes.     POSSIBLE D/C 2-3 days, DEPENDING ON CLINICAL CONDITION.   Dustin Flock M.D on 09/09/2015 at 12:19 PM  Between 7am to 6pm - Pager - [phone] After 6pm go to [email or web address] - password [email or web address] [facility]  Office  [phone]  CC: Primary care physician; [name], MD  Note: This dictation was prepared with Dragon dictation along with smaller phrase technology. Any transcriptional errors that result from this process are unintentional.

## 2015-09-09 NOTE — Progress Notes (Signed)
[facility]  ROUNDING NOTE   Subjective:   Patient's wife is at bedside. o significant clinical change.  No new labs today Patient continues to do poorly Did not interact much  Objective:  Vital signs in last 24 hours:  Temp:  [97.5 F (36.4 C)-98.3 F (36.8 C)] 98.1 F (36.7 C) (06/06 1228) Pulse Rate:  [44-79] 74 (06/06 1228) Resp:  [14-20] 16 (06/06 1228) BP: (118-135)/(54-68) 121/58 mmHg (06/06 1228) SpO2:  [91 %-99 %] 99 % (06/06 1228)  Weight change:  Filed Weights   09/04/15 1914 09/05/15 0305  Weight: 110.224 kg (243 lb) 107.729 kg (237 lb 8 oz)    Intake/Output: I/O last 3 completed shifts: In: 1520 [P.O.:1520] Out: 1325 [Stool:1325]   Intake/Output this shift:  Total I/O In: 0  Out: 900 [Urine:250; Stool:650]  Physical Exam: General: NAD, laying in bed  Head: Normocephalic, atraumatic. Moist oral mucosal membranes  Eyes: Anicteric,  Neck: Supple, trachea midline  Lungs:  Coarse breath sounds bilaterally, room air  Heart: No rub or gallop  Abdomen:  Obese,  +bowel sounds  Extremities:  no peripheral edema.  Neurologic: Lethargic,answers rare questions  Skin: No lesions       Basic Metabolic Panel:  Recent Labs Lab 09/04/15 1910 09/05/15 0406 09/06/15 0456 09/07/15 0547 09/08/15 0630  NA 129* 133* 132* 133* 132*  K 4.5 5.1 4.3 4.2 3.9  CL 91* 91* 94* 97* 95*  CO2 23 25 20* 19* 23  GLUCOSE 234* 174* 161* 230* 262*  BUN 63* 67* 73* 93* 95*  CREATININE 3.75* 3.59* 3.11* 3.06* 2.76*  CALCIUM 9.4 9.4 9.4 9.2 9.2  MG  --   --   --   --  2.6*  PHOS  --   --   --  4.9*  --     Liver Function Tests:  Recent Labs Lab 09/04/15 1910 09/07/15 0547  AST 59*  --   ALT 54  --   ALKPHOS 55  --   BILITOT 1.6*  --   PROT 8.0  --   ALBUMIN 4.1 3.2*    Recent Labs Lab 09/04/15 1910  LIPASE 23   No results for input(s): AMMONIA in the last 168 hours.  CBC:  Recent Labs Lab 09/04/15 1910 09/05/15 0406 09/06/15 0456  09/08/15 0630  WBC 6.0 5.1 7.6 11.5*  NEUTROABS 4.0  --   --   --   HGB 13.7 13.0 13.5 13.4  HCT 39.7* 37.1* 39.3* 39.4*  MCV 88.5 88.6 89.4 87.1  PLT 328 285 322 322    Cardiac Enzymes:  Recent Labs Lab 09/04/15 1910  TROPONINI <0.03    BNP: Invalid input(s): POCBNP  CBG:  Recent Labs Lab 09/08/15 1227 09/08/15 1739 09/08/15 2348 09/09/15 0517 09/09/15 1150  GLUCAP 245* 274* 290* 274* 261*    Microbiology: Results for orders placed or performed during the hospital encounter of 10/26/11  Tissue culture     Status: None   Collection Time: 10/26/11  8:19 AM  Result Value Ref Range Status   Specimen Description TISSUE KNEE LEFT  Final   Special Requests SYNOVIUM LEFT KNEE NO 1  Final   Gram Stain   Final    NO WBC SEEN NO SQUAMOUS EPITHELIAL CELLS SEEN NO ORGANISMS SEEN   Culture NO GROWTH 3 DAYS  Final   Report Status 10/29/2011 FINAL  Final  Anaerobic culture     Status: None   Collection Time: 10/26/11  8:19 AM  Result Value  Ref Range Status   Specimen Description TISSUE KNEE LEFT  Final   Special Requests SYNOVIUM LEFT KNEE NO 1  Final   Gram Stain NO WBC SEEN NO ORGANISMS SEEN  Final   Culture NO ANAEROBES ISOLATED  Final   Report Status 10/29/2011 FINAL  Final  Body fluid culture     Status: None   Collection Time: 10/26/11  8:22 AM  Result Value Ref Range Status   Specimen Description SYNOVIAL FLUID LEFT KNEE  Final   Special Requests FLUID ON SWAB SAMPLE NO 2  Final   Gram Stain NO WBC SEEN NO ORGANISMS SEEN  Final   Culture NO GROWTH 3 DAYS  Final   Report Status 10/29/2011 FINAL  Final  Anaerobic culture     Status: None   Collection Time: 10/26/11  8:22 AM  Result Value Ref Range Status   Specimen Description SYNOVIAL FLUID LEFT KNEE  Final   Special Requests FLUID ON SWAB SAMPLE NO 2  Final   Gram Stain NO WBC SEEN NO ORGANISMS SEEN  Final   Culture NO ANAEROBES ISOLATED  Final   Report Status 10/31/2011 FINAL  Final  Anaerobic culture      Status: None   Collection Time: 10/26/11  8:23 AM  Result Value Ref Range Status   Specimen Description TISSUE KNEE LEFT  Final   Special Requests TIBIAL TISSUE LEFT KNEE SAMPLE NO 3  Final   Gram Stain NO WBC SEEN NO ORGANISMS SEEN  Final   Culture NO ANAEROBES ISOLATED  Final   Report Status 10/29/2011 FINAL  Final  Tissue culture     Status: None   Collection Time: 10/26/11  8:23 AM  Result Value Ref Range Status   Specimen Description TISSUE KNEE LEFT  Final   Special Requests TIBIAL TISSUE LEFT KNEE SAMPLE NO 3  Final   Gram Stain   Final    NO WBC SEEN RARE SQUAMOUS EPITHELIAL CELLS PRESENT NO ORGANISMS SEEN   Culture NO GROWTH 3 DAYS  Final   Report Status 10/29/2011 FINAL  Final    Coagulation Studies: No results for input(s): LABPROT, INR in the last 72 hours.  Urinalysis: No results for input(s): COLORURINE, LABSPEC, PHURINE, GLUCOSEU, HGBUR, BILIRUBINUR, KETONESUR, PROTEINUR, UROBILINOGEN, NITRITE, LEUKOCYTESUR in the last 72 hours.  Invalid input(s): APPERANCEUR    Imaging: No results found.   Medications:   . sodium chloride 50 mL/hr at 09/08/15 2104   . aspirin EC  81 mg Oral Daily  . diltiazem  240 mg Oral Daily  . donepezil  10 mg Oral QHS  . heparin  5,000 Units Subcutaneous Q8H  . insulin aspart  0-5 Units Subcutaneous QHS  . insulin aspart  0-9 Units Subcutaneous TID WC  . insulin detemir  10 Units Subcutaneous Daily  . metoprolol tartrate  25 mg Oral BID  . modafinil  100 mg Oral Daily  . pantoprazole  40 mg Oral Q1200  . PARoxetine  40 mg Oral Daily  . rosuvastatin  20 mg Oral QHS   acetaminophen **OR** acetaminophen, albuterol, ALPRAZolam, ondansetron **OR** ondansetron (ZOFRAN) IV  Assessment/ Plan:  [name] is a 75 y.o. white male [name] is a 75 y.o. white male with vascular dementia, COPD, hypertension, anemia, hyperlipidemia, insulin dependent diabetes mellitus, BPH, colon resection with ileostomy, bladder  cancer, AAA, CVA, CAD, atrial fibrillation, congestive heart failrue, who was admitted to [facility] on 09/04/2015.   1. Acute renal failure on chronic kidney  disease stage IV with proteinuria: Chronic kidney disease stage IV secondary to diabetes, vascular disease and hypertension. Baseline creatinine seems to be ~2.5. Follows with Dr. Florene Glen, [facility] in [place].  - Acute renal failure secondary to small bowel obstruction and prerenal azotemia.  - Continue gentle IV fluids - renally dose medications - conservative management  2. 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  - Continue glucose control.    LOS: 4 Tonyetta Berko 6/6/20173:25 PM

## 2015-09-09 NOTE — Progress Notes (Signed)
CSW left a voicemail with Heather at [facility] to determine if they can offer a bed on patient. Awaiting phone call back. CSW will continue to follow and assist.   Ernest Pine, MSW, [place] Work Department [phone]

## 2015-09-10 ENCOUNTER — Inpatient Hospital Stay: Payer: Medicare Other

## 2015-09-10 LAB — GLUCOSE, CAPILLARY
GLUCOSE-CAPILLARY: 213 mg/dL — AB (ref 65–99)
GLUCOSE-CAPILLARY: 212 mg/dL — AB (ref 65–99)
GLUCOSE-CAPILLARY: 198 mg/dL — AB (ref 65–99)
Glucose-Capillary: 222 mg/dL — ABNORMAL HIGH (ref 65–99)
Glucose-Capillary: 209 mg/dL — ABNORMAL HIGH (ref 65–99)

## 2015-09-10 NOTE — Progress Notes (Signed)
Patient has no acute event overnight. VS remained WDL for patient. Patient was assited with repositioned Q2 and as needed. Remained in asymptomatic afib.[name]

## 2015-09-10 NOTE — Progress Notes (Signed)
CC: Small bowel obstruction Subjective: This patient with a partial small bowel obstruction who is been in the hospital for several days. No family is present at this point. Sitter is present. Patient is tolerating a diet and having fairly high output from the ileostomy. Review of systems not obtainable  Objective: Vital signs in last 24 hours: Temp:  [97.8 F (36.6 C)-98.5 F (36.9 C)] 98.3 F (36.8 C) (06/07 1137) Pulse Rate:  [50-77] 73 (06/07 1137) Resp:  [12-20] 20 (06/07 1137) BP: (93-130)/(56-82) 93/62 mmHg (06/07 1137) SpO2:  [91 %-98 %] 93 % (06/07 1137) Last BM Date: 09/07/15  Intake/Output from previous day: 06/06 0701 - 06/07 0700 In: 520 [P.O.:120; I.V.:400] Out: 2425 [Urine:250; Stool:2175] Intake/Output this shift: Total I/O In: -  Out: 375 [Stool:375]  Physical exam:  Afebrile vital signs are stable abdomen is soft nondistended nontympanitic and nontender succus in ostomy bag.   Lab Results: CBC   Recent Labs  09/08/15 0630  WBC 11.5*  HGB 13.4  HCT 39.4*  PLT 322   BMET  Recent Labs  09/08/15 0630  NA 132*  K 3.9  CL 95*  CO2 23  GLUCOSE 262*  BUN 95*  CREATININE 2.76*  CALCIUM 9.2   PT/INR No results for input(s): LABPROT, INR in the last 72 hours. ABG No results for input(s): PHART, HCO3 in the last 72 hours.  Invalid input(s): PCO2, PO2  Studies/Results: Dg Abd Portable 1v  09/10/2015  CLINICAL DATA:  Follow-up small bowel obstruction EXAM: PORTABLE ABDOMEN - 1 VIEW COMPARISON:  09/07/2015 FINDINGS: Persistent gaseous distended small bowel loops consistent with small bowel obstruction. No significant change. There is no evidence of free abdominal air. IMPRESSION: Persistent gaseous distended small bowel loops consistent with small bowel obstruction. Electronically Signed   By: Lahoma Crocker M.D.   On: 09/10/2015 08:02    Anti-infectives: Anti-infectives    None      Assessment/Plan:  KUB reviewed and unchanged  Patient has  not had a nasogastric tube for several days and is tolerating a diet. He's had no further nausea or vomiting. Report has at that he has had occasional pain however. He is putting out over 2500 cc yesterday from his ileostomy. While his x-ray still appears worrisome for a small bowel obstruction clinically he has not vomited he is tolerating a diet and has reasonable if not normal output from his ileostomy. Without a mind I see no surgical needs at this point and in fact when discussing with the patient's family the other day. Did not want to undergo any sort of surgical intervention or surgery will sign off at this point reconsult as needed  Florene Glen, MD, FACS  09/10/2015

## 2015-09-10 NOTE — Progress Notes (Signed)
Physical Therapy Treatment Patient Details Name: [name] MRN: [id number] DOB: 1941/02/23 Today's Date: 09/10/2015    History of Present Illness [name] is a 75 y.o. male who presents with Abdominal pain with nausea and vomiting for the past 5 days. Patient went to his outpatient physician who thought he maybe had viral gastroenteritis. However, his pain persisted and he was brought to the ED for evaluation. Here he was found on imaging to have a small bowel instruction. Surgery was contacted and does not feel that he needs surgical intervention at this time. Hospitals were called for admission. Symptoms and obstruction appear to be improving at this time. Surgery considering diet for trial. Family not interested in pursuing surgery.     PT Comments    Patient seen at request of family to attempt increase in mobility on this date. Patient is agitated and argumentative throughout session, repeatedly questioning PT's intentions with mobility, and providing poor effort throughout. He requires significant assistance to complete bed mobility and attempt at sit to stand transfer with mod-max A x2. He whispers under his breath he does not like PT, though no overt threats made he remains quite unpleasant throughout session. He has made significant physical decline, likely exacerbated by cognitive status on this admission and would benefit from SNF placement.   Follow Up Recommendations  SNF     Equipment Recommendations       Recommendations for Other Services       Precautions / Restrictions Precautions Precautions: Fall Restrictions Weight Bearing Restrictions: No    Mobility  Bed Mobility Overal bed mobility: Needs Assistance;+2 for physical assistance Bed Mobility: Supine to Sit;Sit to Supine     Supine to sit: Mod assist;+2 for physical assistance Sit to supine: Mod assist;+2 for physical assistance   General bed mobility comments: Patient is quite argumentative, does  not understand why he needs to complete transfer nor does he provide intentional assistance. Requires +2 to manage torso and LEs.   Transfers Overall transfer level: Needs assistance Equipment used: Rolling walker (2 wheeled) Transfers: Sit to/from Stand Sit to Stand: Mod assist;+2 physical assistance;Max assist         General transfer comment: Patient requires multiple attempts to complete standing with heavy assistance. He maintains trunk in flexion throughout, sits spontaneously as he felt his "underwear" coming off (no underwear present). 3 separate standing bouts, none lasting more than 5".   Ambulation/Gait             General Gait Details: Unable   Stairs            Wheelchair Mobility    Modified Rankin (Stroke Patients Only)       Balance Overall balance assessment: Needs assistance Sitting-balance support: Bilateral upper extremity supported Sitting balance-Leahy Scale: Fair Sitting balance - Comments: Requires cga to maintain balance.  Postural control: Posterior lean   Standing balance-Leahy Scale: Poor                      Cognition Arousal/Alertness: Lethargic Behavior During Therapy: Agitated Overall Cognitive Status: History of cognitive impairments - at baseline (Wife is present, this appears to be his baseline though she did not confirm.)                      Exercises General Exercises - Lower Extremity Straight Leg Raises: AROM;Both;10 reps;AAROM    General Comments        Pertinent Vitals/Pain Pain Assessment: No/denies pain  Home Living                      Prior Function            PT Goals (current goals can now be found in the care plan section) Acute Rehab PT Goals Patient Stated Goal: Return to prior function at home and regain strength PT Goal Formulation: With family Time For Goal Achievement: 09/22/15 Potential to Achieve Goals: Poor Progress towards PT goals: Not progressing toward  goals - comment    Frequency  Min 2X/week    PT Plan Current plan remains appropriate    Co-evaluation             End of Session Equipment Utilized During Treatment: Gait belt Activity Tolerance: Patient limited by lethargy;Treatment limited secondary to agitation Patient left: in bed;with call bell/phone within reach;with bed alarm set;with family/visitor present     Time: [id number] PT Time Calculation (min) (ACUTE ONLY): 22 min  Charges:  $Gait Training: 8-22 mins                    G Codes:     Kerman Passey, PT, DPT    09/10/2015, 1:03 PM

## 2015-09-10 NOTE — Consult Note (Signed)
   [facility] CM Inpatient Consult   09/10/2015  [name] May 14, 1940 [id number]   Patient screened for potential [facility] Management services. Patient is eligible for [place]. Electronic medical record reveals patient's discharge plan is a [place] and there were no identifiable [facility] care management needs at this time. [facility] Care Management services not appropriate at this time. If patient's post hospital needs change please place a [facility] Care Management consult. For questions please contact:   Donnette Macmullen RN, [place] Hospital Liaison  [phone]) Business Mobile [phone]) Toll free office

## 2015-09-10 NOTE — Care Management (Signed)
Patient is now awake and alert and eating.  Diet is being advanced.  Informed is having stool.   KUB from this morning shows persistent small bowel obstruction.  Spoke with attending regarding any labs ordered for today.  At present it is still anticipated will discharge to skilled nursing facility.

## 2015-09-10 NOTE — Progress Notes (Signed)
[place] at [place] NAME: [name]    MR#:  [id number]  DATE OF BIRTH:  Oct 17, 1940  SUBJECTIVE:  Patient try to eat breakfast more awake still confused   REVIEW OF SYSTEMS:    Review of Systems  Unable to perform ROS: dementia    Tolerating DietAnd trying to eat    DRUG ALLERGIES:   Allergies  Allergen Reactions  . Fentanyl Other (See Comments)    Delirium  . Hydromorphone Hcl Other (See Comments)    Delirium  . Lorazepam Other (See Comments)    Delirium  . Oxycodone-Acetaminophen Other (See Comments)    Delirium  . Lisinopril   . Vicodin [Hydrocodone-Acetaminophen]     VITALS:  Blood pressure 125/64, pulse 74, temperature 98.3 F (36.8 C), temperature source Oral, resp. rate 16, height 5\' 10"  (1.778 m), weight 107.729 kg (237 lb 8 oz), SpO2 94 %.  PHYSICAL EXAMINATION:   Physical Exam  Constitutional: He is well-developed, well-nourished, and in no distress. No distress.  HENT:  Head: Normocephalic.  Eyes: No scleral icterus.  Neck: Normal range of motion. Neck supple. No JVD present. No tracheal deviation present.  Cardiovascular: Irregular regular and normal heart sounds.  Exam reveals no gallop and no friction rub.   No murmur heard. Pulmonary/Chest: Effort normal and breath sounds normal. No respiratory distress. He has no wheezes. He has no rales. He exhibits no tenderness.  Abdominal: Soft. Diminished bowel sounds He exhibits no mass. Nontender There is no rebound and no guarding.  Ileostomy With output there are still some distention  Musculoskeletal: Normal range of motion. He exhibits no edema.  Neurological: Sleepy Skin: Skin is warm. No rash noted. No erythema.  Psychiatric: Affect and judgment normal.      LABORATORY PANEL:   CBC  Recent Labs Lab 09/08/15 0630  WBC 11.5*  HGB 13.4  HCT 39.4*  PLT 322    ------------------------------------------------------------------------------------------------------------------  Chemistries   Recent Labs Lab 09/04/15 1910  09/08/15 0630  NA 129*  < > 132*  K 4.5  < > 3.9  CL 91*  < > 95*  CO2 23  < > 23  GLUCOSE 234*  < > 262*  BUN 63*  < > 95*  CREATININE 3.75*  < > 2.76*  CALCIUM 9.4  < > 9.2  MG  --   --  2.6*  AST 59*  --   --   ALT 54  --   --   ALKPHOS 55  --   --   BILITOT 1.6*  --   --   < > = values in this interval not displayed. ------------------------------------------------------------------------------------------------------------------  Cardiac Enzymes  Recent Labs Lab 09/04/15 1910  TROPONINI <0.03   ------------------------------------------------------------------------------------------------------------------  RADIOLOGY:  Dg Abd Portable 1v  09/10/2015  CLINICAL DATA:  Follow-up small bowel obstruction EXAM: PORTABLE ABDOMEN - 1 VIEW COMPARISON:  09/07/2015 FINDINGS: Persistent gaseous distended small bowel loops consistent with small bowel obstruction. No significant change. There is no evidence of free abdominal air. IMPRESSION: Persistent gaseous distended small bowel loops consistent with small bowel obstruction. Electronically Signed   By: Lahoma Crocker M.D.   On: 09/10/2015 08:02     ASSESSMENT AND PLAN:    75 year old male with a history of dementia who presents with diarrhea and found to have distal small bowel physician.  1. Distal small bowel obstruction: Patient's diet advanced today was seen by surgery x-ray this morning continues to show partial small  bowel obstruction will await their input   2. Diabetes: Continue sliding scale insulin blood sugars elevated patient will be started on Levemir  3. Essential hypertension: Blood pressure stable  4. Dementia: Continue Aricept.  5. Depression: Continue Paxil Continue when necessary Xanax for anxiety  6. Acute kidney injury on chronic kidney  disease stage IV: Baseline creatinine is 2.5. Renal function close to baseline   7. Hyponatremia: Improved with IV fluids and continue to follow.  8. Paroxysmal atrial fibrillation continue Coreg   CODE STATUS: full  TOTAL TIME TAKING CARE OF THIS PATIENT: 25 minutes.    If tolerates diet and is okay per surgery probable discharge tomorrow  Dustin Flock M.D on 09/10/2015 at 9:40 AM  Between 7am to 6pm - Pager - [phone] After 6pm go to [email or web address] - password [email or web address] [place] [facility]  Office  [phone]  CC: Primary care physician; [name], MD  Note: This dictation was prepared with Dragon dictation along with smaller phrase technology. Any transcriptional errors that result from this process are unintentional.

## 2015-09-10 NOTE — Progress Notes (Signed)
Inpatient Diabetes Program Recommendations  AACE/ADA: New Consensus Statement on Inpatient Glycemic Control (2015)  Target Ranges:  Prepandial:   less than 140 mg/dL      Peak postprandial:   less than 180 mg/dL (1-2 hours)      Critically ill patients:  140 - 180 mg/dL   Lab Results  Component Value Date   GLUCAP 212* 09/10/2015   HGBA1C 6.6* 09/05/2015   Inpatient Diabetes Program Recommendations:   Consider increasing Levemir to 20 units daily.  Thanks, Adah Perl, RN, BC-ADM Inpatient Diabetes Coordinator Pager [phone] (8a-5p)

## 2015-09-10 NOTE — Progress Notes (Signed)
   Please see progress note from 09/09/15. No new cardiac recommendations at this time. Continue current rate-controlling medications. Would strongly consider full-dose anticoagulation as an outpatient if his stability tolerates given his frequent falls.

## 2015-09-11 DIAGNOSIS — I480 Paroxysmal atrial fibrillation: Secondary | ICD-10-CM | POA: Diagnosis not present

## 2015-09-11 DIAGNOSIS — E7800 Pure hypercholesterolemia, unspecified: Secondary | ICD-10-CM | POA: Diagnosis not present

## 2015-09-11 DIAGNOSIS — I5022 Chronic systolic (congestive) heart failure: Secondary | ICD-10-CM | POA: Diagnosis not present

## 2015-09-11 DIAGNOSIS — Z9049 Acquired absence of other specified parts of digestive tract: Secondary | ICD-10-CM | POA: Diagnosis not present

## 2015-09-11 DIAGNOSIS — Z8719 Personal history of other diseases of the digestive system: Secondary | ICD-10-CM | POA: Diagnosis not present

## 2015-09-11 DIAGNOSIS — I2510 Atherosclerotic heart disease of native coronary artery without angina pectoris: Secondary | ICD-10-CM | POA: Diagnosis not present

## 2015-09-11 DIAGNOSIS — R1311 Dysphagia, oral phase: Secondary | ICD-10-CM | POA: Diagnosis not present

## 2015-09-11 DIAGNOSIS — N184 Chronic kidney disease, stage 4 (severe): Secondary | ICD-10-CM | POA: Diagnosis not present

## 2015-09-11 DIAGNOSIS — N289 Disorder of kidney and ureter, unspecified: Secondary | ICD-10-CM | POA: Diagnosis not present

## 2015-09-11 DIAGNOSIS — N189 Chronic kidney disease, unspecified: Secondary | ICD-10-CM | POA: Diagnosis not present

## 2015-09-11 DIAGNOSIS — F0390 Unspecified dementia without behavioral disturbance: Secondary | ICD-10-CM | POA: Diagnosis not present

## 2015-09-11 DIAGNOSIS — Z8673 Personal history of transient ischemic attack (TIA), and cerebral infarction without residual deficits: Secondary | ICD-10-CM | POA: Diagnosis not present

## 2015-09-11 DIAGNOSIS — F17220 Nicotine dependence, chewing tobacco, uncomplicated: Secondary | ICD-10-CM | POA: Diagnosis not present

## 2015-09-11 DIAGNOSIS — K5190 Ulcerative colitis, unspecified, without complications: Secondary | ICD-10-CM | POA: Diagnosis not present

## 2015-09-11 DIAGNOSIS — E119 Type 2 diabetes mellitus without complications: Secondary | ICD-10-CM | POA: Diagnosis not present

## 2015-09-11 DIAGNOSIS — R5381 Other malaise: Secondary | ICD-10-CM | POA: Diagnosis not present

## 2015-09-11 DIAGNOSIS — Z794 Long term (current) use of insulin: Secondary | ICD-10-CM | POA: Diagnosis not present

## 2015-09-11 DIAGNOSIS — R110 Nausea: Secondary | ICD-10-CM | POA: Diagnosis not present

## 2015-09-11 DIAGNOSIS — F329 Major depressive disorder, single episode, unspecified: Secondary | ICD-10-CM | POA: Diagnosis not present

## 2015-09-11 DIAGNOSIS — Z79899 Other long term (current) drug therapy: Secondary | ICD-10-CM | POA: Diagnosis not present

## 2015-09-11 DIAGNOSIS — Z85038 Personal history of other malignant neoplasm of large intestine: Secondary | ICD-10-CM | POA: Diagnosis not present

## 2015-09-11 DIAGNOSIS — M6281 Muscle weakness (generalized): Secondary | ICD-10-CM | POA: Diagnosis not present

## 2015-09-11 DIAGNOSIS — R278 Other lack of coordination: Secondary | ICD-10-CM | POA: Diagnosis not present

## 2015-09-11 DIAGNOSIS — Z87891 Personal history of nicotine dependence: Secondary | ICD-10-CM | POA: Diagnosis not present

## 2015-09-11 DIAGNOSIS — E871 Hypo-osmolality and hyponatremia: Secondary | ICD-10-CM | POA: Diagnosis not present

## 2015-09-11 DIAGNOSIS — Z932 Ileostomy status: Secondary | ICD-10-CM | POA: Diagnosis not present

## 2015-09-11 DIAGNOSIS — I130 Hypertensive heart and chronic kidney disease with heart failure and stage 1 through stage 4 chronic kidney disease, or unspecified chronic kidney disease: Secondary | ICD-10-CM | POA: Diagnosis not present

## 2015-09-11 DIAGNOSIS — E1129 Type 2 diabetes mellitus with other diabetic kidney complication: Secondary | ICD-10-CM | POA: Diagnosis not present

## 2015-09-11 DIAGNOSIS — Z7951 Long term (current) use of inhaled steroids: Secondary | ICD-10-CM | POA: Diagnosis not present

## 2015-09-11 DIAGNOSIS — N183 Chronic kidney disease, stage 3 (moderate): Secondary | ICD-10-CM | POA: Diagnosis not present

## 2015-09-11 DIAGNOSIS — Z8551 Personal history of malignant neoplasm of bladder: Secondary | ICD-10-CM | POA: Diagnosis not present

## 2015-09-11 DIAGNOSIS — N390 Urinary tract infection, site not specified: Secondary | ICD-10-CM | POA: Diagnosis not present

## 2015-09-11 DIAGNOSIS — E1122 Type 2 diabetes mellitus with diabetic chronic kidney disease: Secondary | ICD-10-CM | POA: Diagnosis not present

## 2015-09-11 DIAGNOSIS — Z7401 Bed confinement status: Secondary | ICD-10-CM | POA: Diagnosis not present

## 2015-09-11 DIAGNOSIS — I10 Essential (primary) hypertension: Secondary | ICD-10-CM | POA: Diagnosis not present

## 2015-09-11 DIAGNOSIS — M25562 Pain in left knee: Secondary | ICD-10-CM | POA: Diagnosis not present

## 2015-09-11 DIAGNOSIS — K5669 Other intestinal obstruction: Secondary | ICD-10-CM | POA: Diagnosis not present

## 2015-09-11 DIAGNOSIS — Z7982 Long term (current) use of aspirin: Secondary | ICD-10-CM | POA: Diagnosis not present

## 2015-09-11 DIAGNOSIS — Z8679 Personal history of other diseases of the circulatory system: Secondary | ICD-10-CM | POA: Diagnosis not present

## 2015-09-11 DIAGNOSIS — R531 Weakness: Secondary | ICD-10-CM | POA: Diagnosis not present

## 2015-09-11 DIAGNOSIS — Z955 Presence of coronary angioplasty implant and graft: Secondary | ICD-10-CM | POA: Diagnosis not present

## 2015-09-11 DIAGNOSIS — R109 Unspecified abdominal pain: Secondary | ICD-10-CM | POA: Diagnosis not present

## 2015-09-11 DIAGNOSIS — K5649 Other impaction of intestine: Secondary | ICD-10-CM | POA: Diagnosis not present

## 2015-09-11 LAB — GLUCOSE, CAPILLARY
GLUCOSE-CAPILLARY: 212 mg/dL — AB (ref 65–99)
Glucose-Capillary: 200 mg/dL — ABNORMAL HIGH (ref 65–99)

## 2015-09-11 NOTE — Discharge Summary (Addendum)
[facility] NAME: [name]    MR#:  [id number]  DATE OF BIRTH:  [birth year]  DATE OF ADMISSION:  09/04/2015 ADMITTING PHYSICIAN: Lance Coon, MD  DATE OF DISCHARGE: 09/11/2015 PRIMARY CARE PHYSICIAN: [name], MD    ADMISSION DIAGNOSIS:  Hyponatremia [E87.1] Small bowel obstruction (HCC) [K56.69] Weak [R53.1] Acute on chronic renal insufficiency (HCC) [N28.9, N18.9]   DISCHARGE DIAGNOSIS:   Distal small bowel obstruction SECONDARY DIAGNOSIS:   Past Medical History  Diagnosis Date  . Gout   . Depression   . Diabetes mellitus   . Unspecified essential hypertension   . Hypercholesteremia     IIA  . Colitis, ulcerative ([place])     NOS  . Personal hx-rectal/anal malignancy   . Urinary incontinence   . Benign prostatic hypertrophy     S/p TURP   . H/O ETOH abuse 06/06/2011  . Uncontrolled secondary diabetes mellitus with stage 3 CKD (GFR 30-59) ([place]) 06/05/2011  . Pleurisy   . AAA (abdominal aortic aneurysm) ([place]) 1992    S/p repair and grafting  . History of alcohol abuse   . CAD (coronary artery disease)     a. patient's wife reports no prior stenting  . Stroke [facility])     75 yrs old  . Stones in the urinary tract   . GERD (gastroesophageal reflux disease)   . Normocytic anemia     Chronic  . PAF (paroxysmal atrial fibrillation) (HCC)     a. during hospitalization 06/2011 for sepsis; b. not on long term, full-dose anticoagulation; c. CHADS2VASc 8 (CHF, HTN, age x 2, DM, stroke x 2, vascular dz)  . Chronic systolic CHF (congestive heart failure) (HCC)     EF 40-45% by echo 06/2011  . 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 Colon cancer ([place])   . Chronic bronchitis ([place])     "used to get it quite often; found out medicine was causing it"  . Arthritis     "left knee"  . Obesity   . Vascular dementia   . Frequent falls     HOSPITAL COURSE:  75 year old male with a history of dementia who  presents with diarrhea and found to have distal small bowel physician.  1. Distal small bowel obstruction: improved. Patient has not had a nasogastric tube for several days and is tolerating a diet. Patient's diet advanced. x-ray yesterday showed partial small bowel obstruction. But He's had no further nausea or vomiting and tolerated diet. Per Dr. Burt Knack, did not want to undergo any sort of surgical intervention or surgery.  2. Diabetes: on sliding scale insulin and on Levemir. Resume NPH after discharge.  3. Essential hypertension: Blood pressure stable  4. Dementia: Continue Aricept.  5. Depression: Continue Paxil Continue when necessary Xanax for anxiety  6. Acute kidney injury on chronic kidney disease stage IV: Baseline creatinine is 2.5. Renal function close to baseline   7. Hyponatremia: Improved with IV fluids and continue to follow BMP as outpatient.  8. Paroxysmal atrial fibrillation, rate controlled, continue lopressor and cardizem.   DISCHARGE CONDITIONS:   Stable, discharge to SNF today.  CONSULTS OBTAINED:  Treatment Team:  Hubbard Robinson, MD Lavonia Dana, MD Wellington Hampshire, MD  DRUG ALLERGIES:   Allergies  Allergen Reactions  . Fentanyl Other (See Comments)    Delirium  . Hydromorphone Hcl Other (See Comments)    Delirium  . Lorazepam Other (See Comments)  Delirium  . Oxycodone-Acetaminophen Other (See Comments)    Delirium  . Lisinopril   . Vicodin [Hydrocodone-Acetaminophen]     DISCHARGE MEDICATIONS:   Current Discharge Medication List    CONTINUE these medications which have NOT CHANGED   Details  albuterol (PROVENTIL HFA;VENTOLIN HFA) 108 (90 BASE) MCG/ACT inhaler Inhale 2 puffs into the lungs every 6 (six) hours as needed for wheezing or shortness of breath. Reported on 06/18/2015    aspirin EC 81 MG tablet Take 81 mg by mouth daily.    diltiazem (CARDIZEM CD) 240 MG 24 hr capsule Take 240 mg by mouth daily.    donepezil  (ARICEPT) 10 MG tablet Take 1 tablet (10 mg total) by mouth at bedtime. Qty: 90 tablet, Refills: 2    fenofibrate micronized (LOFIBRA) 134 MG capsule Take 1 capsule by mouth daily.    Ferrous Sulfate 28 MG TABS Take 1 tablet by mouth daily.    fluocinonide cream (LIDEX) [id number] % Apply 1 application topically daily as needed.     fluticasone (FLONASE) 50 MCG/ACT nasal spray Place 1 spray into both nostrils 2 (two) times daily as needed for allergies.     insulin NPH Human (HUMULIN N) 100 UNIT/ML injection Inject 0.7 mLs (70 Units total) into the skin daily before breakfast. Qty: 30 mL, Refills: 4    metoprolol tartrate (LOPRESSOR) 25 MG tablet Take 25 mg by mouth 2 (two) times daily.    Multiple Vitamins-Minerals (CENTRUM SILVER) tablet Take 1 tablet by mouth daily.    Omega-3 Fatty Acids (FISH OIL) 1200 MG CAPS Take 1,200 mg by mouth 2 (two) times daily.    ondansetron (ZOFRAN) 4 MG tablet Take 4 mg by mouth every 8 (eight) hours as needed.     pantoprazole (PROTONIX) 40 MG tablet Take 40 mg by mouth daily at 12 noon.    PARoxetine (PAXIL) 40 MG tablet Take 1 tablet by mouth daily.    rosuvastatin (CRESTOR) 20 MG tablet Take 20 mg by mouth at bedtime.         DISCHARGE INSTRUCTIONS:    If you experience worsening of your admission symptoms, develop shortness of breath, life threatening emergency, suicidal or homicidal thoughts you must seek medical attention immediately by calling 911 or calling your MD immediately  if symptoms less severe.  You Must read complete instructions/literature along with all the possible adverse reactions/side effects for all the Medicines you take and that have been prescribed to you. Take any new Medicines after you have completely understood and accept all the possible adverse reactions/side effects.   Please note  You were cared for by a hospitalist during your hospital stay. If you have any questions about your discharge medications or the care  you received while you were in the hospital after you are discharged, you can call the unit and asked to speak with the hospitalist on call if the hospitalist that took care of you is not available. Once you are discharged, your primary care physician will handle any further medical issues. Please note that NO REFILLS for any discharge medications will be authorized once you are discharged, as it is imperative that you return to your primary care physician (or establish a relationship with a primary care physician if you do not have one) for your aftercare needs so that they can reassess your need for medications and monitor your lab values.    Today   SUBJECTIVE   Demented, tolerated diet. No Nausea or vomiting.   VITAL  SIGNS:  Blood pressure 115/71, pulse 61, temperature 97.6 F (36.4 C), temperature source Oral, resp. rate 18, height 5\' 10"  (1.778 m), weight 237 lb 8 oz (107.729 kg), SpO2 97 %.  I/O:   Intake/Output Summary (Last 24 hours) at 09/11/15 0900 Last data filed at 09/11/15 0100  Gross per 24 hour  Intake    330 ml  Output   1475 ml  Net  -1145 ml    PHYSICAL EXAMINATION:  GENERAL:  75 y.o.-year-old patient lying in the bed with no acute distress.  EYES: Pupils equal, round, reactive to light and accommodation. No scleral icterus. Extraocular muscles intact.  HEENT: Head atraumatic, normocephalic. Oropharynx and nasopharynx clear.  NECK:  Supple, no jugular venous distention. No thyroid enlargement, no tenderness.  LUNGS: Normal breath sounds bilaterally, no wheezing, rales,rhonchi or crepitation. No use of accessory muscles of respiration.  CARDIOVASCULAR: S1, S2 normal. No murmurs, rubs, or gallops.  ABDOMEN: Soft, non-tender, non-distended. Bowel sounds present. No organomegaly or mass.  EXTREMITIES: No pedal edema, cyanosis, or clubbing.  NEUROLOGIC: unable to exam. PSYCHIATRIC: The patient is demented.  SKIN: No obvious rash, lesion, or ulcer.   DATA REVIEW:    CBC  Recent Labs Lab 09/08/15 0630  WBC 11.5*  HGB 13.4  HCT 39.4*  PLT 322    Chemistries   Recent Labs Lab 09/04/15 1910  09/08/15 0630  NA 129*  < > 132*  K 4.5  < > 3.9  CL 91*  < > 95*  CO2 23  < > 23  GLUCOSE 234*  < > 262*  BUN 63*  < > 95*  CREATININE 3.75*  < > 2.76*  CALCIUM 9.4  < > 9.2  MG  --   --  2.6*  AST 59*  --   --   ALT 54  --   --   ALKPHOS 55  --   --   BILITOT 1.6*  --   --   < > = values in this interval not displayed.  Cardiac Enzymes  Recent Labs Lab 09/04/15 1910  TROPONINI <0.03    Microbiology Results  Results for orders placed or performed during the hospital encounter of 10/26/11  Tissue culture     Status: None   Collection Time: 10/26/11  8:19 AM  Result Value Ref Range Status   Specimen Description TISSUE KNEE LEFT  Final   Special Requests SYNOVIUM LEFT KNEE NO 1  Final   Gram Stain   Final    NO WBC SEEN NO SQUAMOUS EPITHELIAL CELLS SEEN NO ORGANISMS SEEN   Culture NO GROWTH 3 DAYS  Final   Report Status 10/29/2011 FINAL  Final  Anaerobic culture     Status: None   Collection Time: 10/26/11  8:19 AM  Result Value Ref Range Status   Specimen Description TISSUE KNEE LEFT  Final   Special Requests SYNOVIUM LEFT KNEE NO 1  Final   Gram Stain NO WBC SEEN NO ORGANISMS SEEN  Final   Culture NO ANAEROBES ISOLATED  Final   Report Status 10/29/2011 FINAL  Final  Body fluid culture     Status: None   Collection Time: 10/26/11  8:22 AM  Result Value Ref Range Status   Specimen Description SYNOVIAL FLUID LEFT KNEE  Final   Special Requests FLUID ON SWAB SAMPLE NO 2  Final   Gram Stain NO WBC SEEN NO ORGANISMS SEEN  Final   Culture NO GROWTH 3 DAYS  Final   Report  Status 10/29/2011 FINAL  Final  Anaerobic culture     Status: None   Collection Time: 10/26/11  8:22 AM  Result Value Ref Range Status   Specimen Description SYNOVIAL FLUID LEFT KNEE  Final   Special Requests FLUID ON SWAB SAMPLE NO 2  Final   Gram Stain  NO WBC SEEN NO ORGANISMS SEEN  Final   Culture NO ANAEROBES ISOLATED  Final   Report Status 10/31/2011 FINAL  Final  Anaerobic culture     Status: None   Collection Time: 10/26/11  8:23 AM  Result Value Ref Range Status   Specimen Description TISSUE KNEE LEFT  Final   Special Requests TIBIAL TISSUE LEFT KNEE SAMPLE NO 3  Final   Gram Stain NO WBC SEEN NO ORGANISMS SEEN  Final   Culture NO ANAEROBES ISOLATED  Final   Report Status 10/29/2011 FINAL  Final  Tissue culture     Status: None   Collection Time: 10/26/11  8:23 AM  Result Value Ref Range Status   Specimen Description TISSUE KNEE LEFT  Final   Special Requests TIBIAL TISSUE LEFT KNEE SAMPLE NO 3  Final   Gram Stain   Final    NO WBC SEEN RARE SQUAMOUS EPITHELIAL CELLS PRESENT NO ORGANISMS SEEN   Culture NO GROWTH 3 DAYS  Final   Report Status 10/29/2011 FINAL  Final    RADIOLOGY:  Dg Abd Portable 1v  09/10/2015  CLINICAL DATA:  Follow-up small bowel obstruction EXAM: PORTABLE ABDOMEN - 1 VIEW COMPARISON:  09/07/2015 FINDINGS: Persistent gaseous distended small bowel loops consistent with small bowel obstruction. No significant change. There is no evidence of free abdominal air. IMPRESSION: Persistent gaseous distended small bowel loops consistent with small bowel obstruction. Electronically Signed   By: Lahoma Crocker M.D.   On: 09/10/2015 08:02        Management plans discussed with the patient, family and they are in agreement.  CODE STATUS:     Code Status Orders        Start     Ordered   09/08/15 1135  Do not attempt resuscitation (DNR)   Continuous    Question Answer Comment  In the event of cardiac or respiratory ARREST Do not call a "code blue"   In the event of cardiac or respiratory ARREST Do not perform Intubation, CPR, defibrillation or ACLS   In the event of cardiac or respiratory ARREST Use medication by any route, position, wound care, and other measures to relive pain and suffering. May use oxygen,  suction and manual treatment of airway obstruction as needed for comfort.      09/08/15 1134    Code Status History    Date Active Date Inactive Code Status Order ID Comments User Context   09/05/2015  3:01 AM 09/08/2015 11:34 AM Full Code [id number]  Lance Coon, MD Inpatient   10/26/2011  1:22 PM 10/29/2011  4:50 PM Full Code [id number]  Carron Brazen, RN Inpatient   08/24/2011  6:24 PM 08/27/2011  5:15 PM Full Code [id number]  Roosvelt Harps, RN Inpatient   06/12/2011  4:39 PM 06/24/2011  6:28 PM Full Code [id number]  Derek Jack, RN Inpatient   06/05/2011 12:54 AM 06/08/2011  4:01 PM Full Code [id number]  Loel Lofty, RN ED      TOTAL TIME TAKING CARE OF THIS PATIENT: 36 minutes.    Demetrios Loll M.D on 09/11/2015 at 9:00 AM  Between 7am to 6pm - Pager - [phone]  After 6pm go to [email or web address] - password [email or web address] [facility]  [facility]  Office  [phone]  CC: Primary care physician; [name], MD

## 2015-09-11 NOTE — Progress Notes (Signed)
Report called to betty rn at [facility]. Patients status along with discharge gone over with staff. dnr form sent with patient via ems. Will call ems for transport.

## 2015-09-11 NOTE — Discharge Instructions (Signed)
Heart healthy and ADA diet. °Activity as tolerated. °Fall and aspiration precaution. °

## 2015-09-11 NOTE — Progress Notes (Signed)
Clinical Social Worker was informed that patient will be medically ready to discharge to [facility]. Patient and his wife- Inez Catalina are in a agreement with plan. CSW called Heather- Admissions Coordinator at [facility] to confirm that patient's bed is ready. Provided patient's room number [id number] and number to call for report [phone] . All discharge information faxed to [facility] via [place]. DNR added to discharge packet. RN will call report and patient will discharge to [facility] via [facility].  Ernest Pine, MSW, [place] Social Work Department [phone]

## 2015-09-11 NOTE — Clinical Social Work Placement (Signed)
   CLINICAL SOCIAL WORK PLACEMENT  NOTE  Date:  09/11/2015  Patient Details  Name: [name] MRN: [id number] Date of Birth: 1940/04/13  Clinical Social Work is seeking post-discharge placement for this patient at the [place] level of care (*CSW will initial, date and re-position this form in  chart as items are completed):  No   Patient/family provided with [place] Work Department's list of facilities offering this level of care within the geographic area requested by the patient (or if unable, by the patient's family).  No   Patient/family informed of their freedom to choose among providers that offer the needed level of care, that participate in Medicare, Medicaid or managed care program needed by the patient, have an available bed and are willing to accept the patient.  No   Patient/family informed of [facility]'s ownership interest in [facility] and [facility], as well as of the fact that they are under no obligation to receive care at these facilities.  PASRR submitted to EDS on       PASRR number received on       Existing PASRR number confirmed on 09/11/15     FL2 transmitted to all facilities in geographic area requested by pt/family on 09/11/15     FL2 transmitted to all facilities within larger geographic area on       Patient informed that his/her managed care company has contracts with or will negotiate with certain facilities, including the following:        Yes   Patient/family informed of bed offers received.  Patient chooses bed at  [facility])     Physician recommends and patient chooses bed at      Patient to be transferred to  ([facility]) on 09/11/15.  Patient to be transferred to facility by  (EMS)     Patient family notified on 09/11/15 of transfer.  Name of family member notified:   (Wife)     PHYSICIAN       Additional Comment:    _______________________________________________ Baldemar Lenis, LCSW 09/11/2015, 9:20 AM

## 2015-09-14 DIAGNOSIS — R5381 Other malaise: Secondary | ICD-10-CM | POA: Diagnosis not present

## 2015-09-14 DIAGNOSIS — E1129 Type 2 diabetes mellitus with other diabetic kidney complication: Secondary | ICD-10-CM | POA: Diagnosis not present

## 2015-09-14 DIAGNOSIS — N184 Chronic kidney disease, stage 4 (severe): Secondary | ICD-10-CM | POA: Diagnosis not present

## 2015-09-14 DIAGNOSIS — Z9049 Acquired absence of other specified parts of digestive tract: Secondary | ICD-10-CM | POA: Diagnosis not present

## 2015-09-14 DIAGNOSIS — M25562 Pain in left knee: Secondary | ICD-10-CM | POA: Diagnosis not present

## 2015-09-30 ENCOUNTER — Encounter: Payer: Self-pay | Admitting: Adult Health

## 2015-09-30 ENCOUNTER — Ambulatory Visit (INDEPENDENT_AMBULATORY_CARE_PROVIDER_SITE_OTHER): Payer: Medicare Other | Admitting: Adult Health

## 2015-09-30 ENCOUNTER — Emergency Department: Payer: Medicare Other

## 2015-09-30 ENCOUNTER — Emergency Department
Admission: EM | Admit: 2015-09-30 | Discharge: 2015-09-30 | Disposition: A | Discharge status: Home / Self Care | Payer: Medicare Other | Attending: Emergency Medicine | Admitting: Emergency Medicine

## 2015-09-30 VITALS — BP 122/68 | HR 68 | Ht 70.0 in | Wt 230.2 lb

## 2015-09-30 DIAGNOSIS — Z955 Presence of coronary angioplasty implant and graft: Secondary | ICD-10-CM | POA: Diagnosis not present

## 2015-09-30 DIAGNOSIS — R109 Unspecified abdominal pain: Secondary | ICD-10-CM | POA: Diagnosis not present

## 2015-09-30 DIAGNOSIS — I130 Hypertensive heart and chronic kidney disease with heart failure and stage 1 through stage 4 chronic kidney disease, or unspecified chronic kidney disease: Secondary | ICD-10-CM | POA: Insufficient documentation

## 2015-09-30 DIAGNOSIS — Z8719 Personal history of other diseases of the digestive system: Secondary | ICD-10-CM | POA: Diagnosis not present

## 2015-09-30 DIAGNOSIS — I5022 Chronic systolic (congestive) heart failure: Secondary | ICD-10-CM | POA: Insufficient documentation

## 2015-09-30 DIAGNOSIS — Z7982 Long term (current) use of aspirin: Secondary | ICD-10-CM | POA: Insufficient documentation

## 2015-09-30 DIAGNOSIS — N390 Urinary tract infection, site not specified: Secondary | ICD-10-CM | POA: Diagnosis not present

## 2015-09-30 DIAGNOSIS — F0390 Unspecified dementia without behavioral disturbance: Secondary | ICD-10-CM

## 2015-09-30 DIAGNOSIS — N183 Chronic kidney disease, stage 3 (moderate): Secondary | ICD-10-CM | POA: Diagnosis not present

## 2015-09-30 DIAGNOSIS — F17220 Nicotine dependence, chewing tobacco, uncomplicated: Secondary | ICD-10-CM | POA: Insufficient documentation

## 2015-09-30 DIAGNOSIS — Z794 Long term (current) use of insulin: Secondary | ICD-10-CM | POA: Insufficient documentation

## 2015-09-30 DIAGNOSIS — E1122 Type 2 diabetes mellitus with diabetic chronic kidney disease: Secondary | ICD-10-CM | POA: Diagnosis not present

## 2015-09-30 DIAGNOSIS — Z8551 Personal history of malignant neoplasm of bladder: Secondary | ICD-10-CM | POA: Diagnosis not present

## 2015-09-30 DIAGNOSIS — I10 Essential (primary) hypertension: Secondary | ICD-10-CM | POA: Diagnosis not present

## 2015-09-30 DIAGNOSIS — Z85038 Personal history of other malignant neoplasm of large intestine: Secondary | ICD-10-CM | POA: Diagnosis not present

## 2015-09-30 DIAGNOSIS — F329 Major depressive disorder, single episode, unspecified: Secondary | ICD-10-CM | POA: Insufficient documentation

## 2015-09-30 DIAGNOSIS — Z7951 Long term (current) use of inhaled steroids: Secondary | ICD-10-CM | POA: Diagnosis not present

## 2015-09-30 DIAGNOSIS — Z8673 Personal history of transient ischemic attack (TIA), and cerebral infarction without residual deficits: Secondary | ICD-10-CM | POA: Insufficient documentation

## 2015-09-30 DIAGNOSIS — Z87891 Personal history of nicotine dependence: Secondary | ICD-10-CM | POA: Diagnosis not present

## 2015-09-30 DIAGNOSIS — I2510 Atherosclerotic heart disease of native coronary artery without angina pectoris: Secondary | ICD-10-CM | POA: Insufficient documentation

## 2015-09-30 DIAGNOSIS — Z79899 Other long term (current) drug therapy: Secondary | ICD-10-CM | POA: Insufficient documentation

## 2015-09-30 DIAGNOSIS — Z8679 Personal history of other diseases of the circulatory system: Secondary | ICD-10-CM | POA: Diagnosis not present

## 2015-09-30 DIAGNOSIS — R110 Nausea: Secondary | ICD-10-CM | POA: Insufficient documentation

## 2015-09-30 DIAGNOSIS — I480 Paroxysmal atrial fibrillation: Secondary | ICD-10-CM | POA: Insufficient documentation

## 2015-09-30 LAB — URINALYSIS COMPLETE WITH MICROSCOPIC (ARMC ONLY)
BILIRUBIN URINE: NEGATIVE
GLUCOSE, UA: NEGATIVE mg/dL
HGB URINE DIPSTICK: NEGATIVE
KETONES UR: NEGATIVE mg/dL
NITRITE: NEGATIVE
Protein, ur: 30 mg/dL — AB
SPECIFIC GRAVITY, URINE: 1.01 (ref 1.005–1.030)
pH: 5 (ref 5.0–8.0)

## 2015-09-30 LAB — COMPREHENSIVE METABOLIC PANEL
ALBUMIN: 4.2 g/dL (ref 3.5–5.0)
ALK PHOS: 58 U/L (ref 38–126)
ALT: 26 U/L (ref 17–63)
ANION GAP: 10 (ref 5–15)
AST: 28 U/L (ref 15–41)
BUN: 22 mg/dL — ABNORMAL HIGH (ref 6–20)
CO2: 25 mmol/L (ref 22–32)
Calcium: 9.8 mg/dL (ref 8.9–10.3)
Chloride: 98 mmol/L — ABNORMAL LOW (ref 101–111)
Creatinine, Ser: 2.2 mg/dL — ABNORMAL HIGH (ref 0.61–1.24)
GFR calc Af Amer: 32 mL/min — ABNORMAL LOW (ref >60)
GFR calc non Af Amer: 27 mL/min — ABNORMAL LOW (ref >60)
GLUCOSE: 99 mg/dL (ref 65–99)
POTASSIUM: 4.6 mmol/L (ref 3.5–5.1)
SODIUM: 133 mmol/L — AB (ref 135–145)
Total Bilirubin: 0.7 mg/dL (ref 0.3–1.2)
Total Protein: 7.8 g/dL (ref 6.5–8.1)

## 2015-09-30 LAB — CBC WITH DIFFERENTIAL/PLATELET
Basophils Absolute: 0 10*3/uL (ref 0–0.1)
Basophils Relative: 1 %
EOS ABS: 0.4 10*3/uL (ref 0–0.7)
Eosinophils Relative: 5 %
HEMATOCRIT: 34.9 % — AB (ref 40.0–52.0)
HEMOGLOBIN: 12.1 g/dL — AB (ref 13.0–18.0)
LYMPHS ABS: 2.3 10*3/uL (ref 1.0–3.6)
Lymphocytes Relative: 32 %
MCH: 30.6 pg (ref 26.0–34.0)
MCHC: 34.7 g/dL (ref 32.0–36.0)
MCV: 88.3 fL (ref 80.0–100.0)
MONOS PCT: 8 %
Monocytes Absolute: 0.6 10*3/uL (ref 0.2–1.0)
NEUTROS ABS: 4 10*3/uL (ref 1.4–6.5)
NEUTROS PCT: 54 %
Platelets: 269 10*3/uL (ref 150–440)
RBC: 3.96 MIL/uL — AB (ref 4.40–5.90)
RDW: 14.4 % (ref 11.5–14.5)
WBC: 7.3 10*3/uL (ref 3.8–10.6)

## 2015-09-30 LAB — TROPONIN I: Troponin I: <0.03 ng/mL (ref <0.03)

## 2015-09-30 LAB — LIPASE, BLOOD: Lipase: 82 U/L — ABNORMAL HIGH (ref 11–51)

## 2015-09-30 MED ORDER — CEPHALEXIN 500 MG PO CAPS
500.0000 mg | ORAL_CAPSULE | Freq: Once | ORAL | Status: AC
  Administered 2015-09-30: 500 mg via ORAL
  Filled 2015-09-30: qty 1

## 2015-09-30 MED ORDER — CEPHALEXIN 500 MG PO CAPS: 500.0000 mg | ORAL_CAPSULE | Freq: Four times a day (QID) | ORAL | Status: AC

## 2015-09-30 MED ORDER — ONDANSETRON HCL 4 MG/2ML IJ SOLN
4.0000 mg | Freq: Once | INTRAMUSCULAR | Status: AC
  Administered 2015-09-30: 4 mg via INTRAVENOUS
  Filled 2015-09-30: qty 2

## 2015-09-30 MED ORDER — DIATRIZOATE MEGLUMINE & SODIUM 66-10 % PO SOLN
15.0000 mL | ORAL | Status: AC
Start: 2015-09-30 — End: 2015-09-30
  Administered 2015-09-30: 30 mL via ORAL

## 2015-09-30 MED ORDER — SODIUM CHLORIDE 0.9 % IV SOLN
1000.0000 mL | Freq: Once | INTRAVENOUS | Status: AC
  Administered 2015-09-30: 1000 mL via INTRAVENOUS

## 2015-09-30 NOTE — ED Provider Notes (Addendum)
[facility] Emergency Department Provider Note        Time seen: ----------------------------------------- 2:20 PM on 09/30/2015 -----------------------------------------    I have reviewed the triage vital signs and the nursing notes.   HISTORY  Chief Complaint Nausea    HPI [name] is a 75 y.o. male who presents to ER with complaints of nausea and intermittent abdominal pain. Wife reports he still has had ostomy output, but this was the case with his prior bowel obstruction. She is concerned she has another bowel obstruction. He complains of nausea all the time. Patient also reports some chest pain, denies fever or diarrhea.   Past Medical History  Diagnosis Date  . Gout   . Depression   . Diabetes mellitus   . Unspecified essential hypertension   . Hypercholesteremia     IIA  . Colitis, ulcerative ([place])     NOS  . Personal hx-rectal/anal malignancy   . Urinary incontinence   . Benign prostatic hypertrophy     S/p TURP   . H/O ETOH abuse 06/06/2011  . Uncontrolled secondary diabetes mellitus with stage 3 CKD (GFR 30-59) ([place]) 06/05/2011  . Pleurisy   . AAA (abdominal aortic aneurysm) ([place]) 1992    S/p repair and grafting  . History of alcohol abuse   . CAD (coronary artery disease)     a. patient's wife reports no prior stenting  . Stroke [facility])     75 yrs old  . Stones in the urinary tract   . GERD (gastroesophageal reflux disease)   . Normocytic anemia     Chronic  . PAF (paroxysmal atrial fibrillation) (HCC)     a. during hospitalization 06/2011 for sepsis; b. not on long term, full-dose anticoagulation; c. CHADS2VASc 8 (CHF, HTN, age x 2, DM, stroke x 2, vascular dz)  . Chronic systolic CHF (congestive heart failure) (HCC)     EF 40-45% by echo 06/2011  . 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 Colon cancer ([place])   . Chronic bronchitis ([place])     "used to get it quite often; found out medicine was causing it"  .  Arthritis     "left knee"  . Obesity   . Vascular dementia   . Frequent falls     Patient Active Problem List   Diagnosis Date Noted  . PAF (paroxysmal atrial fibrillation) ([place]) 09/08/2015  . Vascular dementia   . Frequent falls   . Small bowel obstruction ([place])   . GERD (gastroesophageal reflux disease) 09/04/2015  . Diabetes ([place]) 06/19/2015  . Abnormality of gait 06/26/2014  . Memory disorder 04/23/2013  . Acute renal failure ([place]) 10/27/2011  . Staphylococcus aureus bacteremia 06/14/2011  . 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 CKD (chronic kidney disease) stage 3, GFR 30-59 ml/min 06/12/2011  . Morbid obesity ([place]) 06/12/2011  . Hyponatremia 06/12/2011  . H/O ETOH abuse 06/06/2011  . Hyperkalemia 06/05/2011  . Normocytic anemia 06/05/2011  . SYNCOPE AND COLLAPSE 07/18/2008  . HYPERCHOLESTEROLEMIA  IIA 07/16/2008  . GOUT 07/16/2008  . ANEMIA, NORMOCYTIC, CHRONIC 07/16/2008  . DEPRESSION 07/16/2008  . Essential hypertension 07/16/2008  . COLITIS, ULCERATIVE NOS 05/25/2006  . BPH (benign prostatic hyperplasia) 05/25/2006  . URINARY INCONTINENCE 05/25/2006  . HX, PERSONAL, MALIGNANCY, RECTUM/ANUS 05/25/2006    Past Surgical History  Procedure Laterality Date  . Transurethral resection of prostate    . Abdominal aortic aneurysm repair  1992    "  had 3 blockages; 3 aneurysms repaired at the same time"  . I&d knee with poly exchange  06/12/2011    Procedure: IRRIGATION AND DEBRIDEMENT KNEE WITH POLY EXCHANGE;  Surgeon: Yvette Rack., MD;  Location: [facility];  Service: Orthopedics;  Laterality: Left;  . Appendectomy    . Eye surgery    . Coronary angioplasty      1992  . Colonoscopy w/ polypectomy    . Cataract extraction w/ intraocular lens  implant, bilateral  ~ 2010  . Total knee arthroplasty  1990's    left  . Joint replacement      left knee  . Total knee revision  10/26/11    left  . Total knee revision  10/26/2011    Procedure: TOTAL  KNEE REVISION;  Surgeon: Garald Balding, MD;  Location: [facility];  Service: Orthopedics;  Laterality: Left;  left total knee revision  . Angioplasty    . Bladder tumor excision  2002  . Compound fracture ankle  08/17/2003    Allergies Fentanyl; Hydromorphone hcl; Lorazepam; Oxycodone-acetaminophen; Lisinopril; and Vicodin  Social History Social History  Substance Use Topics  . Smoking status: Former Smoker -- 2.00 packs/day for 12 years    Types: Cigarettes    Quit date: 06/04/1970  . Smokeless tobacco: Current User    Types: Chew  . Alcohol Use: No     Comment: Occasional    Review of Systems Constitutional: Negative for fever. Cardiovascular: Positive for chest pain Respiratory: Negative for shortness of breath. Gastrointestinal:Positive for abdominal pain and nausea Genitourinary: Negative for dysuria. Musculoskeletal: Negative for back pain. Skin: Negative for rash. Neurological: Negative for headaches, focal weakness or numbness.  10-point ROS otherwise negative.  ____________________________________________   PHYSICAL EXAM:  VITAL SIGNS: ED Triage Vitals  Enc Vitals Group     BP 09/30/15 1408 117/80 mmHg     Pulse Rate 09/30/15 1408 99     Resp 09/30/15 1408 20     Temp 09/30/15 1408 98.3 F (36.8 C)     Temp Source 09/30/15 1408 Oral     SpO2 09/30/15 1408 100 %     Weight 09/30/15 1408 230 lb (104.327 kg)     Height 09/30/15 1408 6' (1.829 m)     Head Cir --      Peak Flow --      Pain Score 09/30/15 1411 0     Pain Loc --      Pain Edu? --      Excl. in [place]? --    Constitutional: Alert and oriented. Chronically ill-appearing, no acute distress Eyes: Conjunctivae are pale. PERRL. Normal extraocular movements. ENT   Head: Normocephalic and atraumatic.   Nose: No congestion/rhinnorhea.   Mouth/Throat: Mucous membranes are moist.   Neck: No stridor. Cardiovascular: Normal rate, regular rhythm. No murmurs, rubs, or gallops. Respiratory:  Normal respiratory effort without tachypnea nor retractions. Breath sounds are clear and equal bilaterally. No wheezes/rales/rhonchi. Gastrointestinal: Soft and nontender. Normal bowel sounds, ostomy output appears normal Musculoskeletal: Nontender unremarkable range of motion, no significant edema Neurologic:  Normal speech and language. No gross focal neurologic deficits are appreciated.  Skin:  Skin is warm, dry and intact. Pallor is noted Psychiatric: Mood and affect are normal. Speech and behavior are normal.   ____________________________________________  ED COURSE:  Pertinent labs & imaging results that were available during my care of the patient were reviewed by me and considered in my medical decision making (see chart for details).  Patient presents to ER with persistent nausea and concerns of bowel obstruction. We will check basic labs and imaging. ____________________________________________    LABS (pertinent positives/negatives)  Labs Reviewed  CBC WITH DIFFERENTIAL/PLATELET  COMPREHENSIVE METABOLIC PANEL  LIPASE, BLOOD  TROPONIN I  URINALYSIS COMPLETEWITH MICROSCOPIC ([facility] ONLY)    RADIOLOGY Images were viewed by me  Abdomen 2 view Does not reveal an obstructive Bowel gas pattern ____________________________________________  FINAL ASSESSMENT AND PLAN  Abdominal pain, nausea  Plan: Patient with labs and imaging as dictated above. Patient with persistent pain and nausea. I will order a CT scan without IV contrast but with oral contrast. Reportedly he refused any further surgery this month with a small bowel obstruction. Thankfully this resolved previously with NG tube. Wife is unsure what his request would be now if he had one. Patient care checked out to [facility] for final disposition.   Earleen Newport, MD   Note: This dictation was prepared with Dragon dictation. Any transcriptional errors that result from this process are  unintentional   Earleen [place], MD 09/30/15 [place], MD 09/30/15 [phone]

## 2015-09-30 NOTE — ED Notes (Signed)
Patient was here first week in June for small bowel obstruction. Now at [facility] for rehab. Was sent here for nausea. Patient states he feels fine.

## 2015-09-30 NOTE — ED Provider Notes (Signed)
-----------------------------------------   6:04 PM on 09/30/2015 -----------------------------------------   Blood pressure 117/80, pulse 99, temperature 98.3 F (36.8 C), temperature source Oral, resp. rate 20, height 6' (1.829 m), weight 230 lb (104.327 kg), SpO2 100 %.  Assuming care from Dr. Jimmye Norman.  In short, ROZELLE YUILL is a 75 y.o. male with a chief complaint of Nausea .  Refer to the original H&P for additional details.  The current plan of care is to *follow results of the abdominal CT the patient had performed to rule out bowel obstruction.     CT Abdomen Pelvis Wo Contrast (Final result) Result time: 09/30/15 17:41:28   Final result by Rad Results In Interface (09/30/15 17:41:28)   Narrative:   CLINICAL DATA: Nausea and abdominal pain  EXAM: CT ABDOMEN AND PELVIS WITHOUT CONTRAST  TECHNIQUE: Multidetector CT imaging of the abdomen and pelvis was performed following the standard protocol without IV contrast.  COMPARISON: 09/04/2015  FINDINGS: Lower chest: Lung bases are free of acute infiltrate or sizable effusion.  Hepatobiliary: The liver is within normal limits. The gallbladder has been surgically removed. No biliary ductal dilatation is seen.  Pancreas: Within normal limits.  Spleen: Within normal limits.  Adrenals/Urinary Tract: No renal calculi are seen. No obstructive changes are noted. The bladder is decompressed.  Stomach/Bowel: There is been prior colectomy. A right lower quadrant ostomy is noted. Contrast material flows into the ostomy. The previously seen small bowel dilatation is not well appreciated on this exam.  Vascular/Lymphatic: Aortic calcifications are noted. Additionally there are changes consistent with prior aortic aneurysm repair with aorto bi-iliac bypass graft.  Reproductive: Not applicable  Musculoskeletal: Degenerative change of the lumbar spine is noted. No acute bony abnormality is  seen.  IMPRESSION: Postsurgical changes are noted. The previously seen small bowel obstruction has resolved in the interval. No new focal abnormality is seen.   Electronically Signed By: Inez Catalina M.D. On: 09/30/2015 17:41      Patient's stay here was uneventful and review of his laboratory work it appears that he has a urinary tract infection. CAT scan was negative for bowel obstruction. Patient I felt could be treated on an outpatient basis and was started on oral Keflex here in emergency department with urine culture pending. Currently afebrile and I felt was unlikely to be septic. Discussion with his wife at the bedside she states that she feels comfortable taking him back to the rehabilitation center.  Daymon Larsen, MD 09/30/15 [phone]

## 2015-09-30 NOTE — Discharge Instructions (Signed)
Nausea and Vomiting Nausea is a sick feeling that often comes before throwing up (vomiting). Vomiting is a reflex where stomach contents come out of your mouth. Vomiting can cause severe loss of body fluids (dehydration). Children and elderly adults can become dehydrated quickly, especially if they also have diarrhea. Nausea and vomiting are symptoms of a condition or disease. It is important to find the cause of your symptoms. CAUSES   Direct irritation of the stomach lining. This irritation can result from increased acid production (gastroesophageal reflux disease), infection, food poisoning, taking certain medicines (such as nonsteroidal anti-inflammatory drugs), alcohol use, or tobacco use.  Signals from the brain.These signals could be caused by a headache, heat exposure, an inner ear disturbance, increased pressure in the brain from injury, infection, a tumor, or a concussion, pain, emotional stimulus, or metabolic problems.  An obstruction in the gastrointestinal tract (bowel obstruction).  Illnesses such as diabetes, hepatitis, gallbladder problems, appendicitis, kidney problems, cancer, sepsis, atypical symptoms of a heart attack, or eating disorders.  Medical treatments such as chemotherapy and radiation.  Receiving medicine that makes you sleep (general anesthetic) during surgery. DIAGNOSIS Your caregiver may ask for tests to be done if the problems do not improve after a few days. Tests may also be done if symptoms are severe or if the reason for the nausea and vomiting is not clear. Tests may include:  Urine tests.  Blood tests.  Stool tests.  Cultures (to look for evidence of infection).  X-rays or other imaging studies. Test results can help your caregiver make decisions about treatment or the need for additional tests. TREATMENT You need to stay well hydrated. Drink frequently but in small amounts.You may wish to drink water, sports drinks, clear broth, or eat frozen  ice pops or gelatin dessert to help stay hydrated.When you eat, eating slowly may help prevent nausea.There are also some antinausea medicines that may help prevent nausea. HOME CARE INSTRUCTIONS   Take all medicine as directed by your caregiver.  If you do not have an appetite, do not force yourself to eat. However, you must continue to drink fluids.  If you have an appetite, eat a normal diet unless your caregiver tells you differently.  Eat a variety of complex carbohydrates (rice, wheat, potatoes, bread), lean meats, yogurt, fruits, and vegetables.  Avoid high-fat foods because they are more difficult to digest.  Drink enough water and fluids to keep your urine clear or pale yellow.  If you are dehydrated, ask your caregiver for specific rehydration instructions. Signs of dehydration may include:  Severe thirst.  Dry lips and mouth.  Dizziness.  Dark urine.  Decreasing urine frequency and amount.  Confusion.  Rapid breathing or pulse. SEEK IMMEDIATE MEDICAL CARE IF:   You have blood or brown flecks (like coffee grounds) in your vomit.  You have black or bloody stools.  You have a severe headache or stiff neck.  You are confused.  You have severe abdominal pain.  You have chest pain or trouble breathing.  You do not urinate at least once every 8 hours.  You develop cold or clammy skin.  You continue to vomit for longer than 24 to 48 hours.  You have a fever. MAKE SURE YOU:   Understand these instructions.  Will watch your condition.  Will get help right away if you are not doing well or get worse.   This information is not intended to replace advice given to you by your health care provider. Make sure  you discuss any questions you have with your health care provider.   Document Released: 03/22/2005 Document Revised: 06/14/2011 Document Reviewed: 08/19/2010 Elsevier Interactive Patient Education 2016 Elsevier Inc.  Urinary Tract Infection A  urinary tract infection (UTI) can occur any place along the urinary tract. The tract includes the kidneys, ureters, bladder, and urethra. A type of germ called bacteria often causes a UTI. UTIs are often helped with antibiotic medicine.  HOME CARE   If given, take antibiotics as told by your doctor. Finish them even if you start to feel better.  Drink enough fluids to keep your pee (urine) clear or pale yellow.  Avoid tea, drinks with caffeine, and bubbly (carbonated) drinks.  Pee often. Avoid holding your pee in for a long time.  Pee before and after having sex (intercourse).  Wipe from front to back after you poop (bowel movement) if you are a woman. Use each tissue only once. GET HELP RIGHT AWAY IF:   You have back pain.  You have lower belly (abdominal) pain.  You have chills.  You feel sick to your stomach (nauseous).  You throw up (vomit).  Your burning or discomfort with peeing does not go away.  You have a fever.  Your symptoms are not better in 3 days. MAKE SURE YOU:   Understand these instructions.  Will watch your condition.  Will get help right away if you are not doing well or get worse.   This information is not intended to replace advice given to you by your health care provider. Make sure you discuss any questions you have with your health care provider.   Document Released: 09/08/2007 Document Revised: 04/12/2014 Document Reviewed: 10/21/2011 Elsevier Interactive Patient Education [employer].

## 2015-09-30 NOTE — Progress Notes (Signed)
I have read the note, and I agree with the clinical assessment and plan.  [name],[name]   

## 2015-09-30 NOTE — ED Notes (Addendum)
Patient dressed and cleaning up, wheeled out to wife who verbalized will take pt back to [facility]. Pt stable, NAD noted.

## 2015-09-30 NOTE — Progress Notes (Signed)
PATIENT: [name] DOB: 1940/07/07  REASON FOR VISIT: follow up-mild gait disorder, memory disturbance HISTORY FROM: patient  HISTORY OF PRESENT ILLNESS: [name] is a 75 year old male with a history of mild gait disorder and memory disturbance. He returns today for follow-up. The patient is on Aricept and tolerating this well. He denies any changes with his memory. His wife feels as though things have declined. He requires assistance with all ADLs. He does not operate a motor vehicle. Wife states that he does become easily agitated. She states that she typically just changes the subject or walks away and the patient calms down. Since the last visit the patient developed a bowel obstruction. He is now at [facility] undergoing rehabilitation. He is in a wheelchair today. He returns today for an evaluation.  HISTORY 01/30/15: [name] is a 75 year old right-handed white male with a history of a mild gait disorder. The patient has had a knee surgery on the left, he will occasionally have collapse of the left knee. The patient has fallen on several occasions, the last fall was about 2 weeks prior to this evaluation. The patient did not sustain severe injury. The patient has a memory disturbance, he is on Aricept for this, and has tolerated the medication well. The patient has been able to maintain his cognitive functioning since last seen 6 months ago. The patient still operates a motor vehicle, no problems with directions have been noted. The patient requires some assistance managing his medications, appointments, and keeping up with finances. His wife does these tasks. He returns for an evaluation.  REVIEW OF SYSTEMS: Out of a complete 14 system review of symptoms, the patient complains only of the following symptoms, and all other reviewed systems are negative.  Activity change, appetite change, unexpected weight change, runny nose, chest pain, nausea, abdominal pain, cold  intolerance, joint pain, walking difficulty, agitation, behavior problem, confusion, weakness  ALLERGIES: Allergies  Allergen Reactions  . Fentanyl Other (See Comments)    Delirium  . Hydromorphone Hcl Other (See Comments)    Delirium  . Lorazepam Other (See Comments)    Delirium  . Oxycodone-Acetaminophen Other (See Comments)    Delirium  . Lisinopril   . Vicodin [Hydrocodone-Acetaminophen]     HOME MEDICATIONS: Outpatient Prescriptions Prior to Visit  Medication Sig Dispense Refill  . albuterol (PROVENTIL HFA;VENTOLIN HFA) 108 (90 BASE) MCG/ACT inhaler Inhale 2 puffs into the lungs every 6 (six) hours as needed for wheezing or shortness of breath. Reported on 06/18/2015    . aspirin EC 81 MG tablet Take 81 mg by mouth daily.    Marland Kitchen diltiazem (CARDIZEM CD) 240 MG 24 hr capsule Take 240 mg by mouth daily.    Marland Kitchen donepezil (ARICEPT) 10 MG tablet Take 1 tablet (10 mg total) by mouth at bedtime. 90 tablet 2  . fenofibrate micronized (LOFIBRA) 134 MG capsule Take 1 capsule by mouth daily.    . Ferrous Sulfate 28 MG TABS Take 1 tablet by mouth daily.    . fluocinonide cream (LIDEX) [id number] % Apply 1 application topically daily as needed.     . fluticasone (FLONASE) 50 MCG/ACT nasal spray Place 1 spray into both nostrils 2 (two) times daily as needed for allergies.     Marland Kitchen insulin NPH Human (HUMULIN N) 100 UNIT/ML injection Inject 0.7 mLs (70 Units total) into the skin daily before breakfast. 30 mL 4  . metoprolol tartrate (LOPRESSOR) 25 MG tablet Take 25 mg by mouth 2 (  two) times daily.    . Multiple Vitamins-Minerals (CENTRUM SILVER) tablet Take 1 tablet by mouth daily.    . Omega-3 Fatty Acids (FISH OIL) 1200 MG CAPS Take 1,200 mg by mouth 2 (two) times daily.    . ondansetron (ZOFRAN) 4 MG tablet Take 4 mg by mouth every 8 (eight) hours as needed.     . pantoprazole (PROTONIX) 40 MG tablet Take 40 mg by mouth daily at 12 noon.    Marland Kitchen PARoxetine (PAXIL) 40 MG tablet Take 1 tablet by mouth daily.     . rosuvastatin (CRESTOR) 20 MG tablet Take 20 mg by mouth at bedtime.     No facility-administered medications prior to visit.    PAST MEDICAL HISTORY: Past Medical History  Diagnosis Date  . Gout   . Depression   . Diabetes mellitus   . Unspecified essential hypertension   . Hypercholesteremia     IIA  . Colitis, ulcerative ([place])     NOS  . Personal hx-rectal/anal malignancy   . Urinary incontinence   . Benign prostatic hypertrophy     S/p TURP   . H/O ETOH abuse 06/06/2011  . 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 Pleurisy   . AAA (abdominal aortic aneurysm) ([place]) 1992    S/p repair and grafting  . History of alcohol abuse   . CAD (coronary artery disease)     a. patient's wife reports no prior stenting  . Stroke [facility])     57 yrs old  . Stones in the urinary tract   . GERD (gastroesophageal reflux disease)   . Normocytic anemia     Chronic  . PAF (paroxysmal atrial fibrillation) (HCC)     a. during hospitalization 06/2011 for sepsis; b. not on long term, full-dose anticoagulation; c. CHADS2VASc 8 (CHF, HTN, age x 2, DM, stroke x 2, vascular dz)  . Chronic systolic CHF (congestive heart failure) (HCC)     EF 40-45% by echo 06/2011  . 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 Colon cancer ([place])   . Chronic bronchitis ([place])     "used to get it quite often; found out medicine was causing it"  . Arthritis     "left knee"  . Obesity   . Vascular dementia   . Frequent falls     PAST SURGICAL HISTORY: Past Surgical History  Procedure Laterality Date  . Transurethral resection of prostate    . Abdominal aortic aneurysm repair  1992    "had 3 blockages; 3 aneurysms repaired at the same time"  . I&d knee with poly exchange  06/12/2011    Procedure: IRRIGATION AND DEBRIDEMENT KNEE WITH POLY EXCHANGE;  Surgeon: Yvette Rack., MD;  Location: [facility];  Service: Orthopedics;  Laterality: Left;  . Appendectomy    . Eye  surgery    . Coronary angioplasty      1992  . Colonoscopy w/ polypectomy    . Cataract extraction w/ intraocular lens  implant, bilateral  ~ 2010  . Total knee arthroplasty  1990's    left  . Joint replacement      left knee  . Total knee revision  10/26/11    left  . Total knee revision  10/26/2011    Procedure: TOTAL KNEE REVISION;  Surgeon: Garald Balding, MD;  Location: [facility];  Service: Orthopedics;  Laterality: Left;  left total knee revision  . Angioplasty    .  Bladder tumor excision  2002  . Compound fracture ankle  08/17/2003    FAMILY HISTORY: Family History  Problem Relation Age of Onset  . Dementia Mother   . COPD Father   . Anesthesia problems Neg Hx   . Dementia Sister   . Cancer Brother   . Cancer Brother   . Dementia Brother   . Diabetes Neg Hx     SOCIAL HISTORY: Social History   Social History  . Marital Status: Married    Spouse Name: N/A  . Number of Children: 3  . Years of Education: hs   Occupational History  . Retired    Social History Main Topics  . Smoking status: Former Smoker -- 2.00 packs/day for 12 years    Types: Cigarettes    Quit date: 06/04/1970  . Smokeless tobacco: Current User    Types: Chew  . Alcohol Use: No     Comment: Occasional  . Drug Use: No  . Sexual Activity: Not Currently    Birth Control/ Protection: None   Other Topics Concern  . Not on file   Social History Narrative   Married. Patient lives at home with his wife.   Patient is right handed.   Patient drinks 3 cups of caffeine per day.      PHYSICAL EXAM  Filed Vitals:   09/30/15 0959  BP: 122/68  Pulse: 68  Height: 5\' 10"  (1.778 m)  Weight: 230 lb 3.2 oz (104.418 kg)   Body mass index is 33.03 kg/(m^2).   MMSE - Mini Mental State Exam 01/30/2015 06/26/2014  Orientation to time 4 3  Orientation to Place 5 4  Registration 3 3  Attention/ Calculation 2 5  Recall 2 3  Language- name 2 objects 2 2  Language- repeat 1 0  Language- follow 3  step command 3 3  Language- read & follow direction 1 1  Write a sentence 1 1  Copy design 1 0  Total score 25 25     Generalized: Well developed, in no acute distress   Neurological examination  Mentation: Alert. Follows all commands speech and language fluent. MMSE 18/30 Cranial nerve II-XII: Pupils were equal round reactive to light. Extraocular movements were full, visual field were full on confrontational test. Facial sensation and strength were normal. Uvula tongue midline. Head turning and shoulder shrug  were normal and symmetric. Motor: The motor testing reveals 5 over 5 strength of all 4 extremities. Good symmetric motor tone is noted throughout.  Sensory: Sensory testing is intact to soft touch on all 4 extremities. No evidence of extinction is noted.  Coordination: Cerebellar testing reveals good finger-nose-finger and heel-to-shin bilaterally.  Gait and station:  Patient is in a wheelchair today. Reflexes: Deep tendon reflexes are symmetric and normal bilaterally.   DIAGNOSTIC DATA (LABS, IMAGING, TESTING) - I reviewed patient records, labs, notes, testing and imaging myself where available.  Lab Results  Component Value Date   WBC 11.5* 09/08/2015   HGB 13.4 09/08/2015   HCT 39.4* 09/08/2015   MCV 87.1 09/08/2015   PLT 322 09/08/2015      Component Value Date/Time   NA 132* 09/08/2015 0630   NA 133* 02/05/2014 1043   K 3.9 09/08/2015 0630   K 4.9 02/05/2014 1043   CL 95* 09/08/2015 0630   CO2 23 09/08/2015 0630   CO2 21* 02/05/2014 1043   GLUCOSE 262* 09/08/2015 0630   GLUCOSE 476* 02/05/2014 1043   BUN 95* 09/08/2015 0630  BUN 26.9* 02/05/2014 1043   CREATININE 2.76* 09/08/2015 0630   CREATININE 2.5* 02/05/2014 1043   CREATININE 2.12* 07/07/2011 1714   CALCIUM 9.2 09/08/2015 0630   CALCIUM 9.7 02/05/2014 1043   PROT 8.0 09/04/2015 1910   PROT 6.8 02/05/2014 1043   ALBUMIN 3.2* 09/07/2015 0547   ALBUMIN 4.0 02/05/2014 1043   AST 59* 09/04/2015 1910     AST 22 02/05/2014 1043   ALT 54 09/04/2015 1910   ALT 22 02/05/2014 1043   ALKPHOS 55 09/04/2015 1910   ALKPHOS 53 02/05/2014 1043   BILITOT 1.6* 09/04/2015 1910   BILITOT 0.49 02/05/2014 1043   GFRNONAA 21* 09/08/2015 0630   GFRAA 24* 09/08/2015 0630    Lab Results  Component Value Date   HGBA1C 6.6* 09/05/2015    Lab Results  Component Value Date   TSH 1.851 09/08/2015      ASSESSMENT AND PLAN 75 y.o. 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 here with:  1. Memory disturbance   Patient's memory score has slightly declined. MMSE today is 18/30 was previously 25/30. He will continue on Aricept 10 mg daily. I recommended adding on Namenda however the wife would like to hold off until his stomach issues resolve. Advised that if his symptoms worsen or he develops any new symptoms he should let us know. He will follow-up in 6 months or sooner if needed.     Ward Givens, MSN, NP-C 09/30/2015, 10:04 AM [facility] [address], [place], [place] [address] [phone]

## 2015-09-30 NOTE — Patient Instructions (Signed)
Continue Aricept  Consider Namenda If your symptoms worsen or you develop new symptoms please let us know.   Memantine Tablets What is this medicine? MEMANTINE (MEM an teen) is used to treat dementia caused by Alzheimer's disease. This medicine may be used for other purposes; ask your health care provider or pharmacist if you have questions. What should I tell my health care provider before I take this medicine? They need to know if you have any of these conditions: -difficulty passing urine -kidney disease -liver disease -seizures -an unusual or allergic reaction to memantine, other medicines, foods, dyes, or preservatives -pregnant or trying to get pregnant -breast-feeding How should I use this medicine? Take this medicine by mouth with a glass of water. Follow the directions on the prescription label. You may take this medicine with or without food. Take your doses at regular intervals. Do not take your medicine more often than directed. Continue to take your medicine even if you feel better. Do not stop taking except on the advice of your doctor or health care professional. Talk to your pediatrician regarding the use of this medicine in children. Special care may be needed. Overdosage: If you think you have taken too much of this medicine contact a poison control center or emergency room at once. NOTE: This medicine is only for you. Do not share this medicine with others. What if I miss a dose? If you miss a dose, take it as soon as you can. If it is almost time for your next dose, take only that dose. Do not take double or extra doses. If you do not take your medicine for several days, contact your health care provider. Your dose may need to be changed. What may interact with this medicine? -acetazolamide -amantadine -cimetidine -dextromethorphan -dofetilide -hydrochlorothiazide -ketamine -metformin -methazolamide -quinidine -ranitidine -sodium bicarbonate -triamterene This  list may not describe all possible interactions. Give your health care provider a list of all the medicines, herbs, non-prescription drugs, or dietary supplements you use. Also tell them if you smoke, drink alcohol, or use illegal drugs. Some items may interact with your medicine. What should I watch for while using this medicine? Visit your doctor or health care professional for regular checks on your progress. Check with your doctor or health care professional if there is no improvement in your symptoms or if they get worse. You may get drowsy or dizzy. Do not drive, use machinery, or do anything that needs mental alertness until you know how this drug affects you. Do not stand or sit up quickly, especially if you are an older patient. This reduces the risk of dizzy or fainting spells. Alcohol can make you more drowsy and dizzy. Avoid alcoholic drinks. What side effects may I notice from receiving this medicine? Side effects that you should report to your doctor or health care professional as soon as possible: -allergic reactions like skin rash, itching or hives, swelling of the face, lips, or tongue -agitation or a feeling of restlessness -depressed mood -dizziness -hallucinations -redness, blistering, peeling or loosening of the skin, including inside the mouth -seizures -vomiting Side effects that usually do not require medical attention (report to your doctor or health care professional if they continue or are bothersome): -constipation -diarrhea -headache -nausea -trouble sleeping This list may not describe all possible side effects. Call your doctor for medical advice about side effects. You may report side effects to FDA at 1-800-FDA-1088. Where should I keep my medicine? Keep out of the reach of children.  Store at room temperature between 15 degrees and 30 degrees C (59 degrees and 86 degrees F). Throw away any unused medicine after the expiration date. NOTE: This sheet is a  summary. It may not cover all possible information. If you have questions about this medicine, talk to your doctor, pharmacist, or health care provider.    2016, Elsevier/[employer] Standard. (2013-01-08 14:10:42)

## 2015-10-03 LAB — URINE CULTURE: SPECIAL REQUESTS: NORMAL

## 2015-10-06 ENCOUNTER — Encounter: Payer: Self-pay | Admitting: Adult Health

## 2015-10-06 ENCOUNTER — Other Ambulatory Visit: Payer: Self-pay | Admitting: Neurology

## 2015-10-06 MED ORDER — MEMANTINE HCL 28 X 5 MG & 21 X 10 MG PO TABS: ORAL_TABLET | ORAL | Status: DC

## 2015-10-08 ENCOUNTER — Other Ambulatory Visit: Payer: Self-pay | Admitting: *Deleted

## 2015-10-08 MED ORDER — MEMANTINE HCL 5 MG PO TABS: ORAL_TABLET | ORAL | Status: DC

## 2015-10-08 NOTE — Telephone Encounter (Addendum)
Received fax from [facility], for namenda titration pack,  Need PA.  Can we go ahead and prescribe generic memantine?  I spoke to wife and she was ok to do this.

## 2015-10-08 NOTE — Telephone Encounter (Signed)
I sent prescription in for 5 mg daily for 1 week then increase to 5 mg BID. I will call the patient in 1 month if tolerating we will increase namenda to maintenance dose. Please let patient know.

## 2015-10-09 NOTE — Progress Notes (Signed)
[name] ([id number]) Visit Report for 08/26/2015 Chief Complaint Document Details Patient Name: [name], [name]. Date of Service: 08/26/2015 8:00 AM Medical Record Number: [id number] Patient Account Number: [id number] Date of Birth/Sex: 1940-08-16 (75 y.o. Male) Treating RN: Cornell Barman Primary Care Physician: Daiva Eves Other Clinician: Referring Physician: Daiva Eves Treating Physician/Extender: Tito Dine in Treatment: 0 Information Obtained from: Patient Chief Complaint Patient is here for consultation with regards to cellulitis had an ostomy site referred by his primary care at [facility] family practice in [place] Signature(s) Signed: 08/28/2015 8:28:52 AM By: Gretta Cool RN, BSN, Kim RN, BSN Signed: 10/09/2015 7:34:57 AM By: Linton Ham MD Previous Signature: 08/27/2015 7:52:30 AM Version By: Linton Ham MD Entered By: Gretta Cool, RN, BSN, Kim on 08/28/2015 08:28:52 [name], [name] ([id number]) -------------------------------------------------------------------------------- HPI Details Patient Name: [name]. Date of Service: 08/26/2015 8:00 AM Medical Record Number: [id number] Patient Account Number: [id number] Date of Birth/Sex: 03-18-1941 (75 y.o. Male) Treating RN: Cornell Barman Primary Care Physician: Daiva Eves Other Clinician: Referring Physician: Daiva Eves Treating Physician/Extender: Tito Dine in Treatment: 0 History of Present Illness HPI Description: 08/26/15; this is a patient is here for review of problems with his ostomy site that. Is referred by Dr. Lisbeth Ply at [facility] family practice in [place]. Dr. Lisbeth Ply saw the patient last week prescribed Keflex and Diflucan for a combination of cellulitis question bacterial question fungal. The patient is here for our review. They have already consulted with the ostomy nurse [facility] in [place] and she is  actually change the actual ostomy material. [facility] through [facility] the patient had a total colectomy with an ileostomy raised in 2007. Surgery done by Dr. Kathrin Penner who is a retired Psychologist, sport and exercise in [place] at [place]. Over the years things have been quite stable, the bag needs to be changed several times a day as this is an ileostomy. Generally the ostomy site itself needs to be changed every third day or so. Over the last month there is been increasing difficulties with inflammation of the skin under the adhesive. Weeping edema fluid from the irritated skin is made adhesive of the ostomy material more and more difficult and they're having to change this to her 3 times a day. Of course when they're changing at there is constant ostomy leakage is this is an ileostomy. They have been using antifungal powder with some improvement the. The patient has a history of diabetes, COPD, coronary artery disease prostate cancer and dementia. The dementia has required his wife did take over the management of this problem which I think is a source of frustration to the patient. He does not have a history of real pain, fever or abdominal pain. The frequency of the drainage does not seem to be any different Electronic Signature(s) Signed: 08/28/2015 8:29:05 AM By: Gretta Cool RN, BSN, Kim RN, BSN Signed: 10/09/2015 7:34:57 AM By: Linton Ham MD Previous Signature: 08/27/2015 7:52:30 AM Version By: Linton Ham MD Entered By: Gretta Cool RN, BSN, Kim on 08/28/2015 08:29:05 [name], [name] ([id number]) -------------------------------------------------------------------------------- Physical Exam Details Patient Name: [name]. Date of Service: 08/26/2015 8:00 AM Medical Record Number: [id number] Patient Account Number: [id number] Date of Birth/Sex: 02-08-1941 (75 y.o. Male) Treating RN: Cornell Barman Primary Care Physician: Daiva Eves Other Clinician: Referring Physician: Daiva Eves Treating  Physician/Extender: Ricard Dillon Weeks in Treatment: 0 Constitutional Patient is hypertensive.. Pulse regular and within target range for patient.Marland Kitchen Respirations regular, non-labored and within target range.. Temperature is  normal and within the target range for the patient.. Patient's appearance is neat and clean. Appears in no acute distress. Well nourished and well developed.. Eyes Conjunctivae clear. No discharge. No scleral icterus. Ears, Nose, Mouth, and Throat Oropharynx within normal limits, without erythema, exudate or ulceration.Marland Kitchen Respiratory Respiratory effort is easy and symmetric bilaterally. Rate is normal at rest and on room air.. Bilateral breath sounds are clear and equal in all lobes with no wheezes, rales or rhonchi.. Cardiovascular Heart rhythm and rate regular, without murmur or gallop. Appears to be euvolemic. Gastrointestinal (GI) His ileostomy is in the right upper quadrant. The site itself looks remarkably good. No liver or spleen enlargement or tenderness.. Lymphatic Nonpalpable in the cervical clavicular or axillary areas. Psychiatric No evidence of depression, anxiety, or agitation. Calm, cooperative, and communicative. Appropriate interactions and affect.. Notes Wound exam; the area in question is surrounding his ostomy in the right upper quadrant and of his abdomen. There is a lot of erythema here with some satellite lesions. This is not particularly tender and I'm really doubtful that this reflects a true bacterial cellulitis. The ostomy itself oozes ostomy material almost constantly with any pressure or movement. The ostomy site itself looks very Physicist, medical) Signed: 08/28/2015 8:29:14 AM By: Gretta Cool RN, BSN, Kim RN, BSN Signed: 10/09/2015 7:34:57 AM By: Linton Ham MD Previous Signature: 08/27/2015 7:52:30 AM Version By: Linton Ham MD Entered By: Gretta Cool RN, BSN, Kim on 08/28/2015 08:29:14 [name], [name]  ([id number]) -------------------------------------------------------------------------------- Physician Orders Details Patient Name: [name]. Date of Service: 08/26/2015 8:00 AM Medical Record Number: [id number] Patient Account Number: [id number] Date of Birth/Sex: 1940/11/20 (75 y.o. Male) Treating RN: Cornell Barman Primary Care Physician: Daiva Eves Other Clinician: Referring Physician: Daiva Eves Treating Physician/Extender: Frann Rider in Treatment: 0 Verbal / Phone Orders: Yes Clinician: Cornell Barman Read Back and Verified: Yes Diagnosis Coding ICD-10 Coding Code Description Z43.2 Encounter for attention to ileostomy B35.4 Tinea corporis Wound Cleansing o Cleanse wound with mild soap and water Skin Barriers/Peri-Wound Care o Skin Prep Primary Wound Dressing o Aquacel Ag Secondary Dressing o Tegaderm Dressing Change Frequency o Other: - as needed Follow-up Appointments o Return Appointment in 1 week. Medications-please add to medication list. o Other: - lotrisone cream over the counter o Other: - Eakin Seals Patient Medications Allergies: Dilaudid, Ativan, Percocet, fentanyl, Vicodin, lisinopril, influenza A (H1N1), atorvastatin, hydromorphone HCl, lorazepam Notifications Medication Indication Start End Diflucan DOSE oral 100 mg tablet - continue 1 tablet oral daily for 7 days Electronic Signature(s) Signed: 08/27/2015 5:05:53 PM By: Gretta Cool, RN, BSN, Kim RN, BSN Signed: 08/28/2015 4:02:17 PM By: Christin Fudge MD, FACS [name], [name] ([id number]) Entered By: Gretta Cool RN, BSN, Kim on 08/26/2015 10:18:41 [name], [name] ([id number]) -------------------------------------------------------------------------------- Prescription 08/26/2015 Patient Name: [name] Physician: Ricard Dillon MD Date of Birth: 05/13/1940 NPI#: [id number] Sex: Jerilynn Mages DEA#: [id number] Phone #: [phone] License #: [id number] Patient Address: [place], [place] [address] [facility] [address] [place], [place] [address] [phone] Allergies Dilaudid Ativan Percocet fentanyl Vicodin lisinopril influenza A (H1N1) atorvastatin hydromorphone HCl lorazepam Medication Medication: Route: Strength: Form: Diflucan oral 100 mg tablet ALPHUS, SWEE ([id number]) Class: ANTIFUNGAL AGENTS Dose: Frequency / Time: Indication: continue 1 tablet oral daily for 7 days Number of Refills: Number of Units: 0 Generic Substitution: Start Date: End Date: Administered at [facility]: No Note to Pharmacy: Signature(s): Date(s): Engineer, maintenance) Signed:  08/28/2015 8:29:40 AM By: Gretta Cool RN, BSN, Kim RN, BSN Signed: 10/09/2015 7:34:57 AM By: Linton Ham MD Previous Signature: 08/27/2015 5:05:53 PM Version By: Gretta Cool RN, BSN, Kim RN, BSN Entered By: Gretta Cool, RN, BSN, Kim on 08/28/2015 08:29:40 [name], [name] ([id number]) --------------------------------------------------------------------------------  Problem List Details Patient Name: KYLAND, SCHRECKENGOST. Date of Service: 08/26/2015 8:00 AM Medical Record Number: [id number] Patient Account Number: [id number] Date of Birth/Sex: Jun 15, 1940 (75 y.o. Male) Treating RN: Cornell Barman Primary Care Physician: Daiva Eves Other Clinician: Referring Physician: Daiva Eves Treating Physician/Extender: Tito Dine in Treatment: 0 Active Problems ICD-10 Encounter Code Description Active Date Diagnosis Z43.2 Encounter for attention to ileostomy 08/26/2015 Yes B35.4 Tinea corporis 08/26/2015 Yes Inactive Problems Resolved Problems Electronic Signature(s) Signed: 08/28/2015 8:28:40 AM By: Gretta Cool RN, BSN, Kim RN, BSN Signed: 10/09/2015 7:34:57 AM By: Linton Ham MD Previous Signature: 08/27/2015 7:52:30 AM Version By: Linton Ham MD Entered By:  Gretta Cool, RN, BSN, Kim on 08/28/2015 08:28:40 [name], [name] ([id number]) -------------------------------------------------------------------------------- Progress Note Details Patient Name: [name], [name]. Date of Service: 08/26/2015 8:00 AM Medical Record Number: [id number] Patient Account Number: [id number] Date of Birth/Sex: 11/02/1940 (75 y.o. Male) Treating RN: Cornell Barman Primary Care Physician: Daiva Eves Other Clinician: Referring Physician: Daiva Eves Treating Physician/Extender: Tito Dine in Treatment: 0 Subjective Chief Complaint Information obtained from Patient Patient is here for consultation with regards to cellulitis had an ostomy site referred by his primary care at [facility] in [place] History of Present Illness (HPI) 08/26/15; this is a patient is here for review of problems with his ostomy site that. Is referred by Dr. Lisbeth Ply at [facility] family practice in [place]. Dr. Lisbeth Ply saw the patient last week prescribed Keflex and Diflucan for a combination of cellulitis question bacterial question fungal. The patient is here for our review. They have already consulted with the ostomy nurse [facility] in [place] and she is actually change the actual ostomy material. [facility] the patient had a total colectomy with an ileostomy raised in 2007. Surgery done by Dr. Kathrin Penner who is a retired Psychologist, sport and exercise in [place]. Over the years things have been quite stable, the bag needs to be changed several times a day as this is an ileostomy. Generally the ostomy site itself needs to be changed every third day or so. Over the last month there is been increasing difficulties with inflammation of the skin under the adhesive. Weeping edema fluid from the irritated skin is made adhesive of the ostomy material more and more difficult and they're having to change this to her 3 times  a day. Of course when they're changing at there is constant ostomy leakage is this is an ileostomy. They have been using antifungal powder with some improvement the. The patient has a history of diabetes, COPD, coronary artery disease prostate cancer and dementia. The dementia has required his wife did take over the management of this problem which I think is a source of frustration to the patient. He does not have a history of real pain, fever or abdominal pain. The frequency of the drainage does not seem to be any different Wound History Patient presents with 1 open wound that has been present for approximately 1 month. Patient has been treating wound in the following manner: tried all types of treatments. Laboratory tests have been performed in the last month. Patient reportedly has tested positive for an antibiotic resistant organism. Patient History Information obtained from Patient.  Allergies Dilaudid, Ativan, Percocet, fentanyl, Vicodin, lisinopril, influenza A (H1N1), atorvastatin, hydromorphone HCl, lorazepam ZAKARIE, GIRGENTI. ([id number]) Social History Former smoker, Marital Status - Married, Alcohol Use - Never, Drug Use - No History, Caffeine Use - Daily. Medical History Eyes Patient has history of Cataracts Denies history of Glaucoma, Optic Neuritis Ear/Nose/Mouth/Throat Denies history of Chronic sinus problems/congestion, Middle ear problems Hematologic/Lymphatic Denies history of Anemia, Hemophilia, Human Immunodeficiency Virus, Lymphedema, Sickle Cell Disease Respiratory Patient has history of Chronic Obstructive Pulmonary Disease (COPD) Denies history of Aspiration, Asthma, Pneumothorax, Sleep Apnea, Tuberculosis Cardiovascular Patient has history of Hypertension Denies history of Angina, Arrhythmia, Congestive Heart Failure, Coronary Artery Disease, Deep Vein Thrombosis, Hypotension, Myocardial Infarction, Peripheral Arterial Disease, Peripheral Venous  Disease, Phlebitis, Vasculitis Gastrointestinal Denies history of Cirrhosis , Colitis, Crohn s, Hepatitis A, Hepatitis B, Hepatitis C Endocrine Patient has history of Type II Diabetes Denies history of Type I Diabetes Genitourinary Denies history of End Stage Renal Disease Immunological Denies history of Lupus Erythematosus, Raynaud s, Scleroderma Integumentary (Skin) Denies history of History of Burn, History of pressure wounds Musculoskeletal Patient has history of Gout, Osteoarthritis - knees Denies history of Rheumatoid Arthritis, Osteomyelitis Neurologic Patient has history of Dementia Denies history of Neuropathy, Quadriplegia, Paraplegia, Seizure Disorder Oncologic Patient has history of Received Radiation - Colon Cancer Denies history of Received Chemotherapy Patient is treated with Insulin. Blood sugar is tested. Medical And Surgical History Notes Constitutional Symptoms (General Health) HTN, CKD, Colostomy; Colon Cancer; gerd; copd; gout, cateract, Oncologic Colol and rectum removed 2007 Review of Systems (ROS) [name], [name]. ([id number]) Constitutional Symptoms (General Health) The patient has no complaints or symptoms. Eyes The patient has no complaints or symptoms. Ear/Nose/Mouth/Throat The patient has no complaints or symptoms. Hematologic/Lymphatic The patient has no complaints or symptoms. Respiratory The patient has no complaints or symptoms. Cardiovascular The patient has no complaints or symptoms. Gastrointestinal The patient has no complaints or symptoms. Endocrine Denies complaints or symptoms of Hepatitis, Thyroid disease, Polydypsia (Excessive Thirst). Genitourinary The patient has no complaints or symptoms. Immunological The patient has no complaints or symptoms. Integumentary (Skin) Complains or has symptoms of Wounds, Bleeding or bruising tendency. Denies complaints or symptoms of Breakdown, Swelling. Musculoskeletal The patient has  no complaints or symptoms. Neurologic The patient has no complaints or symptoms. Psychiatric The patient has no complaints or symptoms. Objective Constitutional Patient is hypertensive.. Pulse regular and within target range for patient.Marland Kitchen Respirations regular, non-labored and within target range.. Temperature is normal and within the target range for the patient.. Patient's appearance is neat and clean. Appears in no acute distress. Well nourished and well developed.. Vitals Time Taken: 8:09 AM, Height: 73 in, Weight: 145 lbs, BMI: 19.1, Temperature: 98.0 F, Pulse: 59 bpm, Respiratory Rate: 18 breaths/min, Blood Pressure: 154/75 mmHg. Eyes Conjunctivae clear. No discharge. No scleral icterus. Ears, Nose, Mouth, and Throat Masterson, Shaheim K. ([id number]) Oropharynx within normal limits, without erythema, exudate or ulceration.Marland Kitchen Respiratory Respiratory effort is easy and symmetric bilaterally. Rate is normal at rest and on room air.. Bilateral breath sounds are clear and equal in all lobes with no wheezes, rales or rhonchi.. Cardiovascular Heart rhythm and rate regular, without murmur or gallop. Appears to be euvolemic. Gastrointestinal (GI) His ileostomy is in the right upper quadrant. The site itself looks remarkably good. No liver or spleen enlargement or tenderness.. Lymphatic Nonpalpable in the cervical clavicular or axillary areas. Psychiatric No evidence of depression, anxiety, or agitation. Calm, cooperative, and communicative. Appropriate interactions and affect.. General Notes: Wound exam;  the area in question is surrounding his ostomy in the right upper quadrant and of his abdomen. There is a lot of erythema here with some satellite lesions. This is not particularly tender and I'm really doubtful that this reflects a true bacterial cellulitis. The ostomy itself oozes ostomy material almost constantly with any pressure or movement. The ostomy site itself looks very  healthy Assessment Active Problems ICD-10 Z43.2 - Encounter for attention to ileostomy B35.4 - Tinea corporis Plan Wound Cleansing: Cleanse wound with mild soap and water Skin Barriers/Peri-Wound Care: Skin Prep Primary Wound Dressing: Aquacel Ag [name], [name] ([id number]) Secondary Dressing: Tegaderm Dressing Change Frequency: Other: - as needed Follow-up Appointments: Return Appointment in 1 week. Medications-please add to medication list.: Other: - lotrisone cream over the counter Other: - Eakin Seals The following medication(s) was prescribed: Diflucan oral 100 mg tablet continue 1 tablet oral daily for 7 days #1 I think the problem here is largely a combination of tinea corpus and contact dermatitis. I think a skin issue itself would be reasonably simple to deal with if we didn't have to have the ostomy site adhesion to the area. I don't believe there is any evidence of bacterial cellulitis which of course would be remarkably painful. She has been using with some success nystatin powder but this does not resolve the issue. #2 we applied silver alginate around the ostomy site with covering Tegaderm as a difference surface for the ostomy material itself to adhere to. This seemed to work. I would advise antifungal cream to the erythematous area not covered by the adhesive. This could be an over-the-counter cream like Lotrimin. The only other suggestion I had was a cecostomy tube although his orifice itself did not look that large and I am not sure that we can find a tube that would fit in this. #3 as one of our clinic nurses came up with the idea of the Aquacel Ag and Tegaderm we will see him back next week. I did give him an additional week of Diflucan as I think most of this is likely to be a tineal issue likely combined with some degree of contact dermatitis Electronic Signature(s) Signed: 08/28/2015 8:29:28 AM By: Gretta Cool RN, BSN, Kim RN, BSN Signed: 10/09/2015 7:34:57 AM  By: Linton Ham MD Previous Signature: 08/27/2015 7:52:30 AM Version By: Linton Ham MD Entered By: Gretta Cool, RN, BSN, Kim on 08/28/2015 [id number] [name], [name] ([id number]) -------------------------------------------------------------------------------- ROS/PFSH Details Patient Name: RAKAN, LIMES. Date of Service: 08/26/2015 8:00 AM Medical Record Number: [id number] Patient Account Number: [id number] Date of Birth/Sex: 1940-04-09 (75 y.o. Male) Treating RN: Cornell Barman Primary Care Physician: Daiva Eves Other Clinician: Referring Physician: Daiva Eves Treating Physician/Extender: Tito Dine in Treatment: 0 Information Obtained From Patient Wound History Do you currently have one or more open woundso Yes How many open wounds do you currently haveo 1 Approximately how long have you had your woundso 1 month How have you been treating your wound(s) until nowo tried all types of treatments Endocrine Complaints and Symptoms: Negative for: Hepatitis; Thyroid disease; Polydypsia (Excessive Thirst) Medical History: Positive for: Type II Diabetes Negative for: Type I Diabetes Time with diabetes: 10 plus years Treated with: Insulin Blood sugar tested every day: Yes Tested : every other day Integumentary (Skin) Complaints and Symptoms: Positive for: Wounds; Bleeding or bruising tendency Negative for: Breakdown; Swelling Medical History: Negative for: History of Burn; History of pressure wounds Constitutional Symptoms (General Health) Complaints and Symptoms: No Complaints or Symptoms Medical  History: Past Medical History Notes: HTN, CKD, Colostomy; Colon Cancer; gerd; copd; gout, cateract, Eyes Complaints and Symptoms: No Complaints or Symptoms [name], [name]. ([id number]) Medical History: Positive for: Cataracts Negative for: Glaucoma; Optic Neuritis Ear/Nose/Mouth/Throat Complaints and Symptoms: No Complaints or Symptoms Medical  History: Negative for: Chronic sinus problems/congestion; Middle ear problems Hematologic/Lymphatic Complaints and Symptoms: No Complaints or Symptoms Medical History: Negative for: Anemia; Hemophilia; Human Immunodeficiency Virus; Lymphedema; Sickle Cell Disease Respiratory Complaints and Symptoms: No Complaints or Symptoms Medical History: Positive for: Chronic Obstructive Pulmonary Disease (COPD) Negative for: Aspiration; Asthma; Pneumothorax; Sleep Apnea; Tuberculosis Cardiovascular Complaints and Symptoms: No Complaints or Symptoms Medical History: Positive for: Hypertension Negative for: Angina; Arrhythmia; Congestive Heart Failure; Coronary Artery Disease; Deep Vein Thrombosis; Hypotension; Myocardial Infarction; Peripheral Arterial Disease; Peripheral Venous Disease; Phlebitis; Vasculitis Gastrointestinal Complaints and Symptoms: No Complaints or Symptoms Medical History: Negative for: Cirrhosis ; Colitis; Crohnos; Hepatitis A; Hepatitis B; Hepatitis C Genitourinary [name], WISOR. ([id number]) Complaints and Symptoms: No Complaints or Symptoms Medical History: Negative for: End Stage Renal Disease Immunological Complaints and Symptoms: No Complaints or Symptoms Medical History: Negative for: Lupus Erythematosus; Raynaudos; Scleroderma Musculoskeletal Complaints and Symptoms: No Complaints or Symptoms Medical History: Positive for: Gout; Osteoarthritis - knees Negative for: Rheumatoid Arthritis; Osteomyelitis Neurologic Complaints and Symptoms: No Complaints or Symptoms Medical History: Positive for: Dementia Negative for: Neuropathy; Quadriplegia; Paraplegia; Seizure Disorder Oncologic Medical History: Positive for: Received Radiation - Colon Cancer Negative for: Received Chemotherapy Past Medical History Notes: Colol and rectum removed 2007 Psychiatric Complaints and Symptoms: No Complaints or Symptoms HBO Extended History  Items Eyes: Cataracts Family and Social History JESIE, WALLOCK ([id number]) Former smoker; Marital Status - Married; Alcohol Use: Never; Drug Use: No History; Caffeine Use: Daily; Advanced Directives: Yes (Not Provided); Patient does not want information on Advanced Directives; Living Will: Yes (Not Provided); Medical Power of Attorney: Yes (Not Provided) Electronic Signature(s) Signed: 08/28/2015 8:27:42 AM By: Gretta Cool RN, BSN, Kim RN, BSN Signed: 10/09/2015 7:34:57 AM By: Linton Ham MD Previous Signature: 08/27/2015 5:05:53 PM Version By: Gretta Cool RN, BSN, Kim RN, BSN Entered By: Gretta Cool, RN, BSN, Kim on 08/28/2015 08:27:41 ARLANDER, MAGNON ([id number]) -------------------------------------------------------------------------------- [place] Details Patient Name: GERON, DASE. Date of Service: 08/26/2015 Medical Record Number: [id number] Patient Account Number: [id number] Date of Birth/Sex: [birth year] (75 y.o. Male) Treating RN: Cornell Barman Primary Care Physician: Daiva Eves Other Clinician: Referring Physician: Daiva Eves Treating Physician/Extender: Tito Dine in Treatment: 0 Diagnosis Coding ICD-10 Codes Code Description [phone] Encounter for attention to ileostomy B35.4 Tinea corporis Facility Procedures CPT4 Code: [id number] Description: [place] VISIT-LEV 3 EST PT Modifier: Quantity: 1 Physician Procedures CPT4 Code: [id number] Description: WC PHYS LEVEL 3 o NEW PT ICD-10 Description Diagnosis Z43.2 Encounter for attention to ileostomy Modifier: Quantity: 1 Electronic Signature(s) Signed: 08/28/2015 8:29:34 AM By: Gretta Cool RN, BSN, Kim RN, BSN Signed: 10/09/2015 7:34:57 AM By: Linton Ham MD Previous Signature: 08/27/2015 7:52:30 AM Version By: Linton Ham MD Entered By: Gretta Cool, RN, BSN, Kim on 08/28/2015 08:29:34

## 2015-10-09 NOTE — Progress Notes (Signed)
[name] ([id number]) Visit Report for 08/26/2015 Allergy List Details Patient Name: [name], [name]. Date of Service: 08/26/2015 8:00 AM Medical Record Number: [id number] Patient Account Number: [id number] Date of Birth/Sex: 12/17/1940 (75 y.o. Male) Treating RN: Cornell Barman Primary Care Physician: Daiva Eves Other Clinician: Referring Physician: Daiva Eves Treating Physician/Extender: Ricard Dillon Weeks in Treatment: 0 Allergies Active Allergies Dilaudid Ativan Percocet fentanyl Vicodin lisinopril influenza A (H1N1) atorvastatin hydromorphone HCl lorazepam Allergy Notes Electronic Signature(s) Signed: 08/28/2015 8:27:30 AM By: Gretta Cool, RN, BSN, Kim RN, BSN Previous Signature: 08/27/2015 5:05:53 PM Version By: Gretta Cool, RN, BSN, Kim RN, BSN Entered By: Gretta Cool, RN, BSN, Kim on 08/28/2015 08:27:30 SANTFORD, SOBALVARRO ([id number]) -------------------------------------------------------------------------------- Arrival Information Details Patient Name: [name]. Date of Service: 08/26/2015 8:00 AM Medical Record Number: [id number] Patient Account Number: [id number] Date of Birth/Sex: 12/23/1940 (75 y.o. Male) Treating RN: Cornell Barman Primary Care Physician: Daiva Eves Other Clinician: Referring Physician: Daiva Eves Treating Physician/Extender: Tito Dine in Treatment: 0 Visit Information Patient Arrived: Wheel Chair Arrival Time: 08:06 Accompanied By: wife Transfer Assistance: Manual Patient Identification Verified: Yes Secondary Verification Process Yes Completed: Patient Has Alerts: Yes Patient Alerts: DM II Electronic Signature(s) Signed: 08/28/2015 8:24:19 AM By: Gretta Cool, RN, BSN, Kim RN, BSN Previous Signature: 08/27/2015 5:05:53 PM Version By: Gretta Cool, RN, BSN, Kim RN, BSN Entered By: Gretta Cool, RN, BSN, Kim on 08/28/2015 08:24:19 [name]  ([id number]) -------------------------------------------------------------------------------- Clinic Level of Care Assessment Details Patient Name: [name]. Date of Service: 08/26/2015 8:00 AM Medical Record Number: [id number] Patient Account Number: [id number] Date of Birth/Sex: [birth year] (75 y.o. Male) Treating RN: Cornell Barman Primary Care Physician: Daiva Eves Other Clinician: Referring Physician: Daiva Eves Treating Physician/Extender: Frann Rider in Treatment: 0 Clinic Level of Care Assessment Items TOOL 2 Quantity Score []  - Use when only an EandM is performed on the INITIAL visit 0 ASSESSMENTS - Nursing Assessment / Reassessment X - General Physical Exam (combine w/ comprehensive assessment (listed just 1 20 below) when performed on new pt. evals) X - Comprehensive Assessment (HX, ROS, Risk Assessments, Wounds Hx, etc.) 1 25 ASSESSMENTS - Wound and Skin Assessment / Reassessment X - Simple Wound Assessment / Reassessment - one wound 1 5 []  - Complex Wound Assessment / Reassessment - multiple wounds 0 []  - Dermatologic / Skin Assessment (not related to wound area) 0 ASSESSMENTS - Ostomy and/or Continence Assessment and Care []  - Incontinence Assessment and Management 0 []  - Ostomy Care Assessment and Management (repouching, etc.) 0 PROCESS - Coordination of Care X - Simple Patient / Family Education for ongoing care 1 15 []  - Complex (extensive) Patient / Family Education for ongoing care 0 []  - Staff obtains Programmer, systems, Records, Test Results / Process Orders 0 []  - Staff telephones HHA, Nursing Homes / Clarify orders / etc 0 []  - Routine Transfer to another Facility (non-emergent condition) 0 []  - Routine Hospital Admission (non-emergent condition) 0 []  - New Admissions / Biomedical engineer / Ordering NPWT, Apligraf, etc. 0 []  - Emergency Hospital Admission (emergent condition) 0 X - Simple Discharge Coordination 1 10 RAJAT, BAGENT.  ([id number]) []  - Complex (extensive) Discharge Coordination 0 PROCESS - Special Needs []  - Pediatric / Minor Patient Management 0 []  - Isolation Patient Management 0 []  - Hearing / Language / Visual special needs 0 []  - Assessment of Community assistance (transportation, D/C planning, etc.) 0 []  - Additional assistance / Altered mentation 0 []  - Support Surface(s) Assessment (bed, cushion, seat, etc.) 0  INTERVENTIONS - Wound Cleansing / Measurement X - Wound Imaging (photographs - any number of wounds) 1 5 []  - Wound Tracing (instead of photographs) 0 []  - Simple Wound Measurement - one wound 0 []  - Complex Wound Measurement - multiple wounds 0 X - Simple Wound Cleansing - one wound 1 5 []  - Complex Wound Cleansing - multiple wounds 0 INTERVENTIONS - Wound Dressings X - Small Wound Dressing one or multiple wounds 1 10 []  - Medium Wound Dressing one or multiple wounds 0 []  - Large Wound Dressing one or multiple wounds 0 []  - Application of Medications - injection 0 INTERVENTIONS - Miscellaneous []  - External ear exam 0 []  - Specimen Collection (cultures, biopsies, blood, body fluids, etc.) 0 []  - Specimen(s) / Culture(s) sent or taken to Lab for analysis 0 []  - Patient Transfer (multiple staff / Civil Service fast streamer / Similar devices) 0 []  - Simple Staple / Suture removal (25 or less) 0 []  - Complex Staple / Suture removal (26 or more) 0 Nierenberg, Frazer K. ([id number]) []  - Hypo / Hyperglycemic Management (close monitor of Blood Glucose) 0 []  - Ankle / Brachial Index (ABI) - do not check if billed separately 0 Has the patient been seen at the hospital within the last three years: Yes Total Score: 95 Level Of Care: New/Established - Level 3 Electronic Signature(s) Signed: 08/27/2015 5:05:53 PM By: Gretta Cool, RN, BSN, Kim RN, BSN Entered By: Gretta Cool, RN, BSN, Kim on 08/26/2015 09:20:51 [name]  ([id number]) -------------------------------------------------------------------------------- Encounter Discharge Information Details Patient Name: [name]. Date of Service: 08/26/2015 8:00 AM Medical Record Number: [id number] Patient Account Number: [id number] Date of Birth/Sex: [birth year] (75 y.o. Male) Treating RN: Cornell Barman Primary Care Physician: Daiva Eves Other Clinician: Referring Physician: Daiva Eves Treating Physician/Extender: Tito Dine in Treatment: 0 Encounter Discharge Information Items Discharge Pain Level: 0 Discharge Condition: Stable Ambulatory Status: Wheelchair Discharge Destination: Home Transportation: Private Auto Accompanied By: wife Schedule Follow-up Appointment: Yes Medication Reconciliation completed and provided to Patient/Care Yes Shaday Rayborn: Provided on Clinical Summary of Care: 08/26/2015 Form Type Recipient Paper Patient CS Electronic Signature(s) Signed: 08/28/2015 8:30:07 AM By: Gretta Cool RN, BSN, Kim RN, BSN Previous Signature: 08/26/2015 9:22:31 AM Version By: Ruthine Dose Entered By: Gretta Cool RN, BSN, Kim on 08/28/2015 08:30:07 GOD, PEACHES ([id number]) -------------------------------------------------------------------------------- Multi Wound Chart Details Patient Name: [name]. Date of Service: 08/26/2015 8:00 AM Medical Record Number: [id number] Patient Account Number: [id number] Date of Birth/Sex: 1940-07-27 (75 y.o. Male) Treating RN: Cornell Barman Primary Care Physician: Daiva Eves Other Clinician: Referring Physician: Daiva Eves Treating Physician/Extender: Frann Rider in Treatment: 0 Vital Signs Height(in): 73 Pulse(bpm): 59 Weight(lbs): 145 Blood Pressure 154/75 (mmHg): Body Mass Index(BMI): 19 Temperature(F): 98.0 Respiratory Rate 18 (breaths/min): Wound Assessments Treatment Notes Electronic Signature(s) Signed: 08/27/2015 5:05:53 PM By: Gretta Cool, RN, BSN, Kim RN,  BSN Entered By: Gretta Cool, RN, BSN, Kim on 08/26/2015 08:35:44 [name] ([id number]) -------------------------------------------------------------------------------- [place] Details Patient Name: RAAHIM, GOLAS. Date of Service: 08/26/2015 8:00 AM Medical Record Number: [id number] Patient Account Number: [id number] Date of Birth/Sex: May 25, 1940 (75 y.o. Male) Treating RN: Cornell Barman Primary Care Physician: Daiva Eves Other Clinician: Referring Physician: Daiva Eves Treating Physician/Extender: Frann Rider in Treatment: 0 Active Inactive Abuse / Safety / Falls / Self Care Management Nursing Diagnoses: Potential for falls Goals: Patient will remain injury free Date Initiated: 08/26/2015 Goal Status: Active Interventions: Assess fall risk on admission and as needed Notes: Nutrition Nursing Diagnoses: Impaired glucose control:  actual or potential Goals: Patient/caregiver will maintain therapeutic glucose control Date Initiated: 08/26/2015 Goal Status: Active Interventions: Provide education on elevated blood sugars and impact on wound healing Notes: Orientation to the Wound Care Program Nursing Diagnoses: Knowledge deficit related to the wound healing center program Goals: Patient/caregiver will verbalize understanding of the [place] Date Initiated: 08/26/2015 XENG, SCERBO ([id number]) Goal Status: Active Interventions: Provide education on orientation to the wound center Notes: Soft Tissue Infection Nursing Diagnoses: Impaired tissue integrity Goals: Patient will remain free of wound infection Date Initiated: 08/26/2015 Goal Status: Active Interventions: Assess signs and symptoms of infection every visit Notes: Wound/Skin Impairment Nursing Diagnoses: Impaired tissue integrity Goals: Ulcer/skin breakdown will heal within 14 weeks Date Initiated: 08/26/2015 Goal Status:  Active Interventions: Assess patient/caregiver ability to obtain necessary supplies Notes: Electronic Signature(s) Signed: 08/27/2015 5:05:53 PM By: Gretta Cool, RN, BSN, Kim RN, BSN Entered By: Gretta Cool, RN, BSN, Kim on 08/26/2015 08:22:20 [name] ([id number]) -------------------------------------------------------------------------------- Non-Wound Condition Assessment Details Patient Name: [name]. Date of Service: 08/26/2015 8:00 AM Medical Record Number: [id number] Patient Account Number: [id number] Date of Birth/Sex: [birth year] (75 y.o. Male) Treating RN: Cornell Barman Primary Care Physician: Daiva Eves Other Clinician: Referring Physician: Daiva Eves Treating Physician/Extender: Frann Rider in Treatment: 0 Non-Wound Condition: Condition: Rash / Dermatitis Location: Other: stomach around ostomy site Side: Photos Periwound Skin Texture Texture Color No Abnormalities Noted: No No Abnormalities Noted: No Callus: No Atrophie Blanche: No Crepitus: No Cyanosis: No Excoriation: Yes Ecchymosis: No Fluctuance: No Erythema: No Friable: No Hemosiderin Staining: No Induration: No Mottled: No Localized Edema: No Pallor: No Rash: Yes Rubor: No Scarring: No Temperature / Pain Moisture Temperature: No Abnormality No Abnormalities Noted: No Tenderness on Palpation: Yes Dry / Scaly: No Maceration: No Moist: Yes Electronic Signature(s) Signed: 08/27/2015 5:05:53 PM By: Gretta Cool, RN, BSN, Kim RN, BSN Entered By: Gretta Cool, RN, BSN, Kim on 08/26/2015 09:34:43 JAVIONE, MATHERS ([id number], KERNAGHAN ([id number]) -------------------------------------------------------------------------------- Pain Assessment Details Patient Name: DERRIEN, GADZINSKI. Date of Service: 08/26/2015 8:00 AM Medical Record Number: [id number] Patient Account Number: [id number] Date of Birth/Sex: 12/06/1940 (75 y.o. Male) Treating RN: Cornell Barman Primary Care Physician: Daiva Eves Other Clinician: Referring Physician: Daiva Eves Treating Physician/Extender: Tito Dine in Treatment: 0 Active Problems Location of Pain Severity and Description of Pain Patient Has Paino No Site Locations Pain Management and Medication Current Pain Management: Electronic Signature(s) Signed: 08/28/2015 8:24:34 AM By: Gretta Cool, RN, BSN, Kim RN, BSN Previous Signature: 08/27/2015 5:05:53 PM Version By: Gretta Cool, RN, BSN, Kim RN, BSN Entered By: Gretta Cool, RN, BSN, Kim on 08/28/2015 08:24:34 [name] ([id number]) -------------------------------------------------------------------------------- Patient/Caregiver Education Details Patient Name: [name] Date of Service: 08/26/2015 8:00 AM Medical Record Number: [id number] Patient Account Number: [id number] Date of Birth/Gender: [birth year] (75 y.o. Male) Treating RN: Cornell Barman Primary Care Physician: Daiva Eves Other Clinician: Referring Physician: Daiva Eves Treating Physician/Extender: Tito Dine in Treatment: 0 Education Assessment Education Provided To: Patient and Caregiver Education Topics Provided Wound/Skin Impairment: Handouts: Caring for Your Ulcer, Other: wound care as prescribed Methods: Demonstration, Explain/Verbal Responses: State content correctly Electronic Signature(s) Signed: 08/28/2015 8:30:17 AM By: Gretta Cool, RN, BSN, Kim RN, BSN Previous Signature: 08/27/2015 5:05:53 PM Version By: Gretta Cool, RN, BSN, Kim RN, BSN Entered By: Gretta Cool, RN, BSN, Kim on 08/28/2015 08:30:17 [name] ([id number]) -------------------------------------------------------------------------------- Vitals Details Patient Name: [name]. Date of Service: 08/26/2015 8:00 AM Medical Record Number: [id number] Patient Account Number: [id number] Date  of Birth/Sex: [birth year] (75 y.o. Male) Treating RN: Cornell Barman Primary Care Physician: Daiva Eves Other Clinician: Referring  Physician: Daiva Eves Treating Physician/Extender: Tito Dine in Treatment: 0 Vital Signs Time Taken: 08:09 Temperature (F): 98.0 Height (in): 73 Pulse (bpm): 59 Weight (lbs): 145 Respiratory Rate (breaths/min): 18 Body Mass Index (BMI): 19.1 Blood Pressure (mmHg): 154/75 Reference Range: 80 - 120 mg / dl Electronic Signature(s) Signed: 08/28/2015 8:24:44 AM By: Gretta Cool, RN, BSN, Kim RN, BSN Previous Signature: 08/27/2015 5:05:53 PM Version By: Gretta Cool, RN, BSN, Kim RN, BSN Entered By: Gretta Cool, RN, BSN, Kim on 08/28/2015 08:24:44

## 2015-10-15 ENCOUNTER — Telehealth: Payer: Self-pay | Admitting: Adult Health

## 2015-10-15 NOTE — Telephone Encounter (Signed)
I called and left a message- ask that she call the office tomorrow.

## 2015-10-16 ENCOUNTER — Encounter: Payer: Self-pay | Admitting: Adult Health

## 2015-10-16 NOTE — Telephone Encounter (Signed)
Patient's wife is returning a call from [place].

## 2015-10-16 NOTE — Telephone Encounter (Signed)
I called the patient's wife. She is concerned about his driving. She states that when he was first diagnosed with dementia he had trouble getting lost. However she states that that happened recently. The patient only drives small distances in familiar places. She does have a device on his truck to track him. However he has been in the hospital due to a bowel obstruction. She states that due to weakness she doesn't feel that he shouldn't drive. I have not evaluated the patient since he's been in the hospital. From a neurological standpoint his driving should be scrutinized. I advised that I would provide her with a letter stating that

## 2015-10-17 DIAGNOSIS — N184 Chronic kidney disease, stage 4 (severe): Secondary | ICD-10-CM | POA: Diagnosis not present

## 2015-10-17 DIAGNOSIS — Z85038 Personal history of other malignant neoplasm of large intestine: Secondary | ICD-10-CM | POA: Diagnosis not present

## 2015-10-17 DIAGNOSIS — Z933 Colostomy status: Secondary | ICD-10-CM | POA: Diagnosis not present

## 2015-10-17 DIAGNOSIS — I130 Hypertensive heart and chronic kidney disease with heart failure and stage 1 through stage 4 chronic kidney disease, or unspecified chronic kidney disease: Secondary | ICD-10-CM | POA: Diagnosis not present

## 2015-10-17 DIAGNOSIS — K5190 Ulcerative colitis, unspecified, without complications: Secondary | ICD-10-CM | POA: Diagnosis not present

## 2015-10-17 DIAGNOSIS — E1122 Type 2 diabetes mellitus with diabetic chronic kidney disease: Secondary | ICD-10-CM | POA: Diagnosis not present

## 2015-10-17 DIAGNOSIS — I2510 Atherosclerotic heart disease of native coronary artery without angina pectoris: Secondary | ICD-10-CM | POA: Diagnosis not present

## 2015-10-17 DIAGNOSIS — I5022 Chronic systolic (congestive) heart failure: Secondary | ICD-10-CM | POA: Diagnosis not present

## 2015-10-17 DIAGNOSIS — R531 Weakness: Secondary | ICD-10-CM | POA: Diagnosis not present

## 2015-10-17 DIAGNOSIS — I480 Paroxysmal atrial fibrillation: Secondary | ICD-10-CM | POA: Diagnosis not present

## 2015-10-17 DIAGNOSIS — J449 Chronic obstructive pulmonary disease, unspecified: Secondary | ICD-10-CM | POA: Diagnosis not present

## 2015-10-17 DIAGNOSIS — M1711 Unilateral primary osteoarthritis, right knee: Secondary | ICD-10-CM | POA: Diagnosis not present

## 2015-10-20 DIAGNOSIS — I130 Hypertensive heart and chronic kidney disease with heart failure and stage 1 through stage 4 chronic kidney disease, or unspecified chronic kidney disease: Secondary | ICD-10-CM | POA: Diagnosis not present

## 2015-10-20 DIAGNOSIS — Z85038 Personal history of other malignant neoplasm of large intestine: Secondary | ICD-10-CM | POA: Diagnosis not present

## 2015-10-20 DIAGNOSIS — I2510 Atherosclerotic heart disease of native coronary artery without angina pectoris: Secondary | ICD-10-CM | POA: Diagnosis not present

## 2015-10-20 DIAGNOSIS — N184 Chronic kidney disease, stage 4 (severe): Secondary | ICD-10-CM | POA: Diagnosis not present

## 2015-10-20 DIAGNOSIS — M1711 Unilateral primary osteoarthritis, right knee: Secondary | ICD-10-CM | POA: Diagnosis not present

## 2015-10-20 DIAGNOSIS — Z933 Colostomy status: Secondary | ICD-10-CM | POA: Diagnosis not present

## 2015-10-20 DIAGNOSIS — E1122 Type 2 diabetes mellitus with diabetic chronic kidney disease: Secondary | ICD-10-CM | POA: Diagnosis not present

## 2015-10-20 DIAGNOSIS — J449 Chronic obstructive pulmonary disease, unspecified: Secondary | ICD-10-CM | POA: Diagnosis not present

## 2015-10-20 DIAGNOSIS — I5022 Chronic systolic (congestive) heart failure: Secondary | ICD-10-CM | POA: Diagnosis not present

## 2015-10-20 DIAGNOSIS — I480 Paroxysmal atrial fibrillation: Secondary | ICD-10-CM | POA: Diagnosis not present

## 2015-10-20 DIAGNOSIS — R531 Weakness: Secondary | ICD-10-CM | POA: Diagnosis not present

## 2015-10-20 DIAGNOSIS — K5190 Ulcerative colitis, unspecified, without complications: Secondary | ICD-10-CM | POA: Diagnosis not present

## 2015-10-21 ENCOUNTER — Ambulatory Visit (INDEPENDENT_AMBULATORY_CARE_PROVIDER_SITE_OTHER): Payer: Medicare Other | Admitting: Endocrinology

## 2015-10-21 ENCOUNTER — Encounter: Payer: Self-pay | Admitting: Endocrinology

## 2015-10-21 VITALS — BP 108/68 | HR 77 | Wt 234.0 lb

## 2015-10-21 DIAGNOSIS — Z933 Colostomy status: Secondary | ICD-10-CM | POA: Diagnosis not present

## 2015-10-21 DIAGNOSIS — Z794 Long term (current) use of insulin: Secondary | ICD-10-CM | POA: Diagnosis not present

## 2015-10-21 DIAGNOSIS — I2510 Atherosclerotic heart disease of native coronary artery without angina pectoris: Secondary | ICD-10-CM | POA: Diagnosis not present

## 2015-10-21 DIAGNOSIS — E1122 Type 2 diabetes mellitus with diabetic chronic kidney disease: Secondary | ICD-10-CM

## 2015-10-21 DIAGNOSIS — M1711 Unilateral primary osteoarthritis, right knee: Secondary | ICD-10-CM | POA: Diagnosis not present

## 2015-10-21 DIAGNOSIS — N184 Chronic kidney disease, stage 4 (severe): Secondary | ICD-10-CM

## 2015-10-21 DIAGNOSIS — K5190 Ulcerative colitis, unspecified, without complications: Secondary | ICD-10-CM | POA: Diagnosis not present

## 2015-10-21 DIAGNOSIS — Z85038 Personal history of other malignant neoplasm of large intestine: Secondary | ICD-10-CM | POA: Diagnosis not present

## 2015-10-21 DIAGNOSIS — I130 Hypertensive heart and chronic kidney disease with heart failure and stage 1 through stage 4 chronic kidney disease, or unspecified chronic kidney disease: Secondary | ICD-10-CM | POA: Diagnosis not present

## 2015-10-21 DIAGNOSIS — R531 Weakness: Secondary | ICD-10-CM | POA: Diagnosis not present

## 2015-10-21 DIAGNOSIS — J449 Chronic obstructive pulmonary disease, unspecified: Secondary | ICD-10-CM | POA: Diagnosis not present

## 2015-10-21 DIAGNOSIS — I5022 Chronic systolic (congestive) heart failure: Secondary | ICD-10-CM | POA: Diagnosis not present

## 2015-10-21 DIAGNOSIS — I480 Paroxysmal atrial fibrillation: Secondary | ICD-10-CM | POA: Diagnosis not present

## 2015-10-21 MED ORDER — INSULIN NPH (HUMAN) (ISOPHANE) 100 UNIT/ML ~~LOC~~ SUSP: 10.0000 [IU] | Freq: Every day | SUBCUTANEOUS | Status: DC

## 2015-10-21 NOTE — Patient Instructions (Addendum)
check your blood sugar twice a day.  vary the time of day when you check, between before the 3 meals, and at bedtime.  also check if you have symptoms of your blood sugar being too high or too low.  please keep a record of the readings and bring it to your next appointment here (or you can bring the meter itself).  You can write it on any piece of paper.  please call us sooner if your blood sugar goes below 70, or if you have a lot of readings over 200. Please resume the same insulin, at 10 units each morning.  With time, your insulin requirement will probably and slowly increase back up to where it was (70 units each morning) Please come back for a follow-up appointment in 3 months.

## 2015-10-21 NOTE — Progress Notes (Signed)
Subjective:    Patient ID: [name], male    DOB: 1940-10-08, 75 y.o.   MRN: [id number]  HPI Pt returns for f/u of diabetes mellitus: DM type: Insulin-requiring type 2.  Dx'ed: [id number] Complications: polyneuropathy, CAD, and renal failure.   Therapy: insulin since 2011.  DKA: never Severe hypoglycemia: never Pancreatitis: never Other: he takes QD insulin, due to h/o noncompliance; he takes human insulin, due to cost.   Interval history: Wife has given him only 1 dose of insulin in the past week, due to hypoglycemia.  Since then she says cbg's vary from 117-183.  pt states he feels much better in general, since recent hospitalization.  Past Medical History  Diagnosis Date  . Gout   . Depression   . Diabetes mellitus   . Unspecified essential hypertension   . Hypercholesteremia     IIA  . Colitis, ulcerative ([place])     NOS  . Personal hx-rectal/anal malignancy   . Urinary incontinence   . Benign prostatic hypertrophy     S/p TURP   . H/O ETOH abuse 06/06/2011  . Uncontrolled secondary diabetes mellitus with stage 3 CKD (GFR 30-59) ([place]) 06/05/2011  . Pleurisy   . AAA (abdominal aortic aneurysm) ([place]) 1992    S/p repair and grafting  . History of alcohol abuse   . CAD (coronary artery disease)     a. patient's wife reports no prior stenting  . Stroke [facility])     75 yrs old  . Stones in the urinary tract   . GERD (gastroesophageal reflux disease)   . Normocytic anemia     Chronic  . PAF (paroxysmal atrial fibrillation) (HCC)     a. during hospitalization 06/2011 for sepsis; b. not on long term, full-dose anticoagulation; c. CHADS2VASc 8 (CHF, HTN, age x 2, DM, stroke x 2, vascular dz)  . Chronic systolic CHF (congestive heart failure) (HCC)     EF 40-45% by echo 06/2011  . 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 Colon cancer ([place])   . Chronic bronchitis ([place])     "used to get it quite often; found out medicine was causing it"  . Arthritis     "left knee"  .  Obesity   . Vascular dementia   . Frequent falls     Past Surgical History  Procedure Laterality Date  . Transurethral resection of prostate    . Abdominal aortic aneurysm repair  1992    "had 3 blockages; 3 aneurysms repaired at the same time"  . I&d knee with poly exchange  06/12/2011    Procedure: IRRIGATION AND DEBRIDEMENT KNEE WITH POLY EXCHANGE;  Surgeon: Yvette Rack., MD;  Location: [facility];  Service: Orthopedics;  Laterality: Left;  . Appendectomy    . Eye surgery    . Coronary angioplasty      1992  . Colonoscopy w/ polypectomy    . Cataract extraction w/ intraocular lens  implant, bilateral  ~ 2010  . Total knee arthroplasty  1990's    left  . Joint replacement      left knee  . Total knee revision  10/26/11    left  . Total knee revision  10/26/2011    Procedure: TOTAL KNEE REVISION;  Surgeon: Garald Balding, MD;  Location: [place];  Service: Orthopedics;  Laterality: Left;  left total knee revision  . Angioplasty    . Bladder tumor excision  2002  . Compound fracture  ankle  08/17/2003    Social History   Social History  . Marital Status: Married    Spouse Name: N/A  . Number of Children: 3  . Years of Education: hs   Occupational History  . Retired    Social History Main Topics  . Smoking status: Former Smoker -- 2.00 packs/day for 12 years    Types: Cigarettes    Quit date: 06/04/1970  . Smokeless tobacco: Current User    Types: Chew  . Alcohol Use: No     Comment: Occasional  . Drug Use: No  . Sexual Activity: Not Currently    Birth Control/ Protection: None   Other Topics Concern  . Not on file   Social History Narrative   Married. Patient lives at home with his wife.   Patient is right handed.   Patient drinks 3 cups of caffeine per day.    Current Outpatient Prescriptions on File Prior to Visit  Medication Sig Dispense Refill  . albuterol (PROVENTIL HFA;VENTOLIN HFA) 108 (90 BASE) MCG/ACT inhaler Inhale 2 puffs into the lungs every 6  (six) hours as needed for wheezing or shortness of breath. Reported on 06/18/2015    . aspirin EC 81 MG tablet Take 81 mg by mouth daily.    Marland Kitchen diltiazem (CARDIZEM CD) 240 MG 24 hr capsule Take 240 mg by mouth daily.    Marland Kitchen donepezil (ARICEPT) 10 MG tablet Take 1 tablet (10 mg total) by mouth at bedtime. 90 tablet 2  . fluocinonide cream (LIDEX) AB-123456789 % Apply 1 application topically daily as needed.     . fluticasone (FLONASE) 50 MCG/ACT nasal spray Place 1 spray into both nostrils 2 (two) times daily as needed for allergies.     . furosemide (LASIX) 40 MG tablet Take 40 mg by mouth daily.    . memantine (NAMENDA TITRATION PAK) tablet pack 5 mg/day for =1 week; 5 mg twice daily for =1 week; 15 mg/day given in 5 mg and 10 mg separated doses for =1 week; then 10 mg twice daily 49 tablet 0  . memantine (NAMENDA) 5 MG tablet Take 1 tablet daily for one week then increase to 1 tablet BID thereafter. 60 tablet 5  . metoprolol tartrate (LOPRESSOR) 25 MG tablet Take 25 mg by mouth 2 (two) times daily.    . Multiple Vitamins-Minerals (CENTRUM SILVER) tablet Take 1 tablet by mouth daily.    . Omega-3 Fatty Acids (FISH OIL) 1200 MG CAPS Take 1,200 mg by mouth 2 (two) times daily.    . ondansetron (ZOFRAN) 4 MG tablet Take 4 mg by mouth every 8 (eight) hours as needed.     . pantoprazole (PROTONIX) 40 MG tablet Take 40 mg by mouth daily at 12 noon.    Marland Kitchen PARoxetine (PAXIL) 40 MG tablet Take 20 mg by mouth daily.     . rosuvastatin (CRESTOR) 20 MG tablet Take 10 mg by mouth at bedtime.     Marland Kitchen UNKNOWN TO PATIENT Ferrous sulfate 65mg  po daily    . fenofibrate micronized (LOFIBRA) 134 MG capsule Take 1 capsule by mouth daily. Reported on 10/21/2015     No current facility-administered medications on file prior to visit.    Allergies  Allergen Reactions  . Fentanyl Other (See Comments)    Delirium  . Hydromorphone Hcl Other (See Comments)    Delirium  . Lorazepam Other (See Comments)    Delirium  .  Oxycodone-Acetaminophen Other (See Comments)    Delirium  .  Lisinopril   . Vicodin [Hydrocodone-Acetaminophen]     Family History  Problem Relation Age of Onset  . Dementia Mother   . COPD Father   . Anesthesia problems Neg Hx   . Dementia Sister   . Cancer Brother   . Cancer Brother   . Dementia Brother   . Diabetes Neg Hx     BP 108/68 mmHg  Pulse 77  Wt 234 lb (106.142 kg)  SpO2 95%  Review of Systems Denies LOC.  He has lost 11 lbs since hosp d/c, but has started to regaim per wife.     Objective:   Physical Exam VITAL SIGNS:  See vs page GENERAL: no distress.  In wheelchair Pulses: dorsalis pedis intact bilat.   MSK: no deformity of the feet CV: trace bilat leg edema.   Skin:  no ulcer on the feet.  normal color and temp on the feet.  Neuro: sensation is intact to touch on the feet, but decreased from normal.   Ext: There is bilateral onychomycosis of the toenails.     Lab Results  Component Value Date   HGBA1C 6.6* 09/05/2015   Lab Results  Component Value Date   CREATININE 2.20* 09/30/2015   BUN 22* 09/30/2015   NA 133* 09/30/2015   K 4.6 09/30/2015   CL 98* 09/30/2015   CO2 25 09/30/2015      Assessment & Plan:  Insulin-requiring type 2 DM: her insulin requirement has decreased, due to acute illness. Renal failure: this worsening has also reduced his insulin requirement.  However, it is more recently improved.  Weight loss.  He is regaining.  As this happens, his insulin requirement will increase again.  However, the increased insulin requirement usually lags the weight change  Patient is advised the following: Patient Instructions  check your blood sugar twice a day.  vary the time of day when you check, between before the 3 meals, and at bedtime.  also check if you have symptoms of your blood sugar being too high or too low.  please keep a record of the readings and bring it to your next appointment here (or you can bring the meter itself).  You  can write it on any piece of paper.  please call us sooner if your blood sugar goes below 70, or if you have a lot of readings over 200. Please resume the same insulin, at 10 units each morning.  With time, your insulin requirement will probably and slowly increase back up to where it was (70 units each morning) Please come back for a follow-up appointment in 3 months.    Renato Shin, MD

## 2015-10-22 DIAGNOSIS — K5190 Ulcerative colitis, unspecified, without complications: Secondary | ICD-10-CM | POA: Diagnosis not present

## 2015-10-22 DIAGNOSIS — R531 Weakness: Secondary | ICD-10-CM | POA: Diagnosis not present

## 2015-10-22 DIAGNOSIS — I130 Hypertensive heart and chronic kidney disease with heart failure and stage 1 through stage 4 chronic kidney disease, or unspecified chronic kidney disease: Secondary | ICD-10-CM | POA: Diagnosis not present

## 2015-10-22 DIAGNOSIS — I5022 Chronic systolic (congestive) heart failure: Secondary | ICD-10-CM | POA: Diagnosis not present

## 2015-10-22 DIAGNOSIS — Z933 Colostomy status: Secondary | ICD-10-CM | POA: Diagnosis not present

## 2015-10-22 DIAGNOSIS — N184 Chronic kidney disease, stage 4 (severe): Secondary | ICD-10-CM | POA: Diagnosis not present

## 2015-10-22 DIAGNOSIS — E1122 Type 2 diabetes mellitus with diabetic chronic kidney disease: Secondary | ICD-10-CM | POA: Diagnosis not present

## 2015-10-22 DIAGNOSIS — I480 Paroxysmal atrial fibrillation: Secondary | ICD-10-CM | POA: Diagnosis not present

## 2015-10-22 DIAGNOSIS — I2510 Atherosclerotic heart disease of native coronary artery without angina pectoris: Secondary | ICD-10-CM | POA: Diagnosis not present

## 2015-10-22 DIAGNOSIS — Z85038 Personal history of other malignant neoplasm of large intestine: Secondary | ICD-10-CM | POA: Diagnosis not present

## 2015-10-22 DIAGNOSIS — M1711 Unilateral primary osteoarthritis, right knee: Secondary | ICD-10-CM | POA: Diagnosis not present

## 2015-10-22 DIAGNOSIS — J449 Chronic obstructive pulmonary disease, unspecified: Secondary | ICD-10-CM | POA: Diagnosis not present

## 2015-10-23 DIAGNOSIS — I2510 Atherosclerotic heart disease of native coronary artery without angina pectoris: Secondary | ICD-10-CM | POA: Diagnosis not present

## 2015-10-23 DIAGNOSIS — K5190 Ulcerative colitis, unspecified, without complications: Secondary | ICD-10-CM | POA: Diagnosis not present

## 2015-10-23 DIAGNOSIS — I130 Hypertensive heart and chronic kidney disease with heart failure and stage 1 through stage 4 chronic kidney disease, or unspecified chronic kidney disease: Secondary | ICD-10-CM | POA: Diagnosis not present

## 2015-10-23 DIAGNOSIS — E1122 Type 2 diabetes mellitus with diabetic chronic kidney disease: Secondary | ICD-10-CM | POA: Diagnosis not present

## 2015-10-23 DIAGNOSIS — R531 Weakness: Secondary | ICD-10-CM | POA: Diagnosis not present

## 2015-10-23 DIAGNOSIS — Z933 Colostomy status: Secondary | ICD-10-CM | POA: Diagnosis not present

## 2015-10-23 DIAGNOSIS — M1711 Unilateral primary osteoarthritis, right knee: Secondary | ICD-10-CM | POA: Diagnosis not present

## 2015-10-23 DIAGNOSIS — I5022 Chronic systolic (congestive) heart failure: Secondary | ICD-10-CM | POA: Diagnosis not present

## 2015-10-23 DIAGNOSIS — N184 Chronic kidney disease, stage 4 (severe): Secondary | ICD-10-CM | POA: Diagnosis not present

## 2015-10-23 DIAGNOSIS — I480 Paroxysmal atrial fibrillation: Secondary | ICD-10-CM | POA: Diagnosis not present

## 2015-10-23 DIAGNOSIS — J449 Chronic obstructive pulmonary disease, unspecified: Secondary | ICD-10-CM | POA: Diagnosis not present

## 2015-10-23 DIAGNOSIS — Z85038 Personal history of other malignant neoplasm of large intestine: Secondary | ICD-10-CM | POA: Diagnosis not present

## 2015-10-27 DIAGNOSIS — Z933 Colostomy status: Secondary | ICD-10-CM | POA: Diagnosis not present

## 2015-10-27 DIAGNOSIS — I130 Hypertensive heart and chronic kidney disease with heart failure and stage 1 through stage 4 chronic kidney disease, or unspecified chronic kidney disease: Secondary | ICD-10-CM | POA: Diagnosis not present

## 2015-10-27 DIAGNOSIS — E1122 Type 2 diabetes mellitus with diabetic chronic kidney disease: Secondary | ICD-10-CM | POA: Diagnosis not present

## 2015-10-27 DIAGNOSIS — I5022 Chronic systolic (congestive) heart failure: Secondary | ICD-10-CM | POA: Diagnosis not present

## 2015-10-27 DIAGNOSIS — K5190 Ulcerative colitis, unspecified, without complications: Secondary | ICD-10-CM | POA: Diagnosis not present

## 2015-10-27 DIAGNOSIS — I480 Paroxysmal atrial fibrillation: Secondary | ICD-10-CM | POA: Diagnosis not present

## 2015-10-27 DIAGNOSIS — N184 Chronic kidney disease, stage 4 (severe): Secondary | ICD-10-CM | POA: Diagnosis not present

## 2015-10-27 DIAGNOSIS — M1711 Unilateral primary osteoarthritis, right knee: Secondary | ICD-10-CM | POA: Diagnosis not present

## 2015-10-27 DIAGNOSIS — R531 Weakness: Secondary | ICD-10-CM | POA: Diagnosis not present

## 2015-10-27 DIAGNOSIS — J449 Chronic obstructive pulmonary disease, unspecified: Secondary | ICD-10-CM | POA: Diagnosis not present

## 2015-10-27 DIAGNOSIS — I2510 Atherosclerotic heart disease of native coronary artery without angina pectoris: Secondary | ICD-10-CM | POA: Diagnosis not present

## 2015-10-27 DIAGNOSIS — Z85038 Personal history of other malignant neoplasm of large intestine: Secondary | ICD-10-CM | POA: Diagnosis not present

## 2015-10-29 DIAGNOSIS — E119 Type 2 diabetes mellitus without complications: Secondary | ICD-10-CM | POA: Diagnosis not present

## 2015-10-29 DIAGNOSIS — Z932 Ileostomy status: Secondary | ICD-10-CM | POA: Diagnosis not present

## 2015-10-30 DIAGNOSIS — Z85038 Personal history of other malignant neoplasm of large intestine: Secondary | ICD-10-CM | POA: Diagnosis not present

## 2015-10-30 DIAGNOSIS — N184 Chronic kidney disease, stage 4 (severe): Secondary | ICD-10-CM | POA: Diagnosis not present

## 2015-10-30 DIAGNOSIS — I5022 Chronic systolic (congestive) heart failure: Secondary | ICD-10-CM | POA: Diagnosis not present

## 2015-10-30 DIAGNOSIS — J449 Chronic obstructive pulmonary disease, unspecified: Secondary | ICD-10-CM | POA: Diagnosis not present

## 2015-10-30 DIAGNOSIS — I2510 Atherosclerotic heart disease of native coronary artery without angina pectoris: Secondary | ICD-10-CM | POA: Diagnosis not present

## 2015-10-30 DIAGNOSIS — R531 Weakness: Secondary | ICD-10-CM | POA: Diagnosis not present

## 2015-10-30 DIAGNOSIS — Z933 Colostomy status: Secondary | ICD-10-CM | POA: Diagnosis not present

## 2015-10-30 DIAGNOSIS — I480 Paroxysmal atrial fibrillation: Secondary | ICD-10-CM | POA: Diagnosis not present

## 2015-10-30 DIAGNOSIS — E1122 Type 2 diabetes mellitus with diabetic chronic kidney disease: Secondary | ICD-10-CM | POA: Diagnosis not present

## 2015-10-30 DIAGNOSIS — M1711 Unilateral primary osteoarthritis, right knee: Secondary | ICD-10-CM | POA: Diagnosis not present

## 2015-10-30 DIAGNOSIS — I130 Hypertensive heart and chronic kidney disease with heart failure and stage 1 through stage 4 chronic kidney disease, or unspecified chronic kidney disease: Secondary | ICD-10-CM | POA: Diagnosis not present

## 2015-10-30 DIAGNOSIS — K5190 Ulcerative colitis, unspecified, without complications: Secondary | ICD-10-CM | POA: Diagnosis not present

## 2015-11-11 DIAGNOSIS — E1129 Type 2 diabetes mellitus with other diabetic kidney complication: Secondary | ICD-10-CM | POA: Diagnosis not present

## 2015-11-11 DIAGNOSIS — N390 Urinary tract infection, site not specified: Secondary | ICD-10-CM | POA: Diagnosis not present

## 2015-11-11 DIAGNOSIS — K5669 Other intestinal obstruction: Secondary | ICD-10-CM | POA: Diagnosis not present

## 2015-11-13 DIAGNOSIS — Z932 Ileostomy status: Secondary | ICD-10-CM | POA: Diagnosis not present

## 2015-11-13 DIAGNOSIS — N390 Urinary tract infection, site not specified: Secondary | ICD-10-CM | POA: Diagnosis not present

## 2015-11-25 DIAGNOSIS — N390 Urinary tract infection, site not specified: Secondary | ICD-10-CM | POA: Diagnosis not present

## 2015-12-12 DIAGNOSIS — E119 Type 2 diabetes mellitus without complications: Secondary | ICD-10-CM | POA: Diagnosis not present

## 2015-12-12 DIAGNOSIS — Z933 Colostomy status: Secondary | ICD-10-CM | POA: Diagnosis not present

## 2015-12-12 DIAGNOSIS — Z932 Ileostomy status: Secondary | ICD-10-CM | POA: Diagnosis not present

## 2015-12-19 DIAGNOSIS — Z932 Ileostomy status: Secondary | ICD-10-CM | POA: Diagnosis not present

## 2015-12-19 DIAGNOSIS — E119 Type 2 diabetes mellitus without complications: Secondary | ICD-10-CM | POA: Diagnosis not present

## 2016-01-06 DIAGNOSIS — N184 Chronic kidney disease, stage 4 (severe): Secondary | ICD-10-CM | POA: Diagnosis not present

## 2016-01-06 DIAGNOSIS — E1129 Type 2 diabetes mellitus with other diabetic kidney complication: Secondary | ICD-10-CM | POA: Diagnosis not present

## 2016-01-06 DIAGNOSIS — K9400 Colostomy complication, unspecified: Secondary | ICD-10-CM | POA: Diagnosis not present

## 2016-01-06 DIAGNOSIS — J449 Chronic obstructive pulmonary disease, unspecified: Secondary | ICD-10-CM | POA: Diagnosis not present

## 2016-01-06 DIAGNOSIS — Z85038 Personal history of other malignant neoplasm of large intestine: Secondary | ICD-10-CM | POA: Diagnosis not present

## 2016-01-06 DIAGNOSIS — Z433 Encounter for attention to colostomy: Secondary | ICD-10-CM | POA: Diagnosis not present

## 2016-01-09 DIAGNOSIS — J449 Chronic obstructive pulmonary disease, unspecified: Secondary | ICD-10-CM | POA: Diagnosis not present

## 2016-01-09 DIAGNOSIS — E1129 Type 2 diabetes mellitus with other diabetic kidney complication: Secondary | ICD-10-CM | POA: Diagnosis not present

## 2016-01-09 DIAGNOSIS — Z85038 Personal history of other malignant neoplasm of large intestine: Secondary | ICD-10-CM | POA: Diagnosis not present

## 2016-01-09 DIAGNOSIS — K9400 Colostomy complication, unspecified: Secondary | ICD-10-CM | POA: Diagnosis not present

## 2016-01-09 DIAGNOSIS — Z933 Colostomy status: Secondary | ICD-10-CM | POA: Diagnosis not present

## 2016-01-09 DIAGNOSIS — Z433 Encounter for attention to colostomy: Secondary | ICD-10-CM | POA: Diagnosis not present

## 2016-01-09 DIAGNOSIS — N184 Chronic kidney disease, stage 4 (severe): Secondary | ICD-10-CM | POA: Diagnosis not present

## 2016-01-09 DIAGNOSIS — E782 Mixed hyperlipidemia: Secondary | ICD-10-CM | POA: Diagnosis not present

## 2016-01-13 DIAGNOSIS — N184 Chronic kidney disease, stage 4 (severe): Secondary | ICD-10-CM | POA: Diagnosis not present

## 2016-01-13 DIAGNOSIS — J449 Chronic obstructive pulmonary disease, unspecified: Secondary | ICD-10-CM | POA: Diagnosis not present

## 2016-01-13 DIAGNOSIS — K9400 Colostomy complication, unspecified: Secondary | ICD-10-CM | POA: Diagnosis not present

## 2016-01-13 DIAGNOSIS — Z433 Encounter for attention to colostomy: Secondary | ICD-10-CM | POA: Diagnosis not present

## 2016-01-13 DIAGNOSIS — E1129 Type 2 diabetes mellitus with other diabetic kidney complication: Secondary | ICD-10-CM | POA: Diagnosis not present

## 2016-01-13 DIAGNOSIS — Z85038 Personal history of other malignant neoplasm of large intestine: Secondary | ICD-10-CM | POA: Diagnosis not present

## 2016-01-15 DIAGNOSIS — J449 Chronic obstructive pulmonary disease, unspecified: Secondary | ICD-10-CM | POA: Diagnosis not present

## 2016-01-15 DIAGNOSIS — K9400 Colostomy complication, unspecified: Secondary | ICD-10-CM | POA: Diagnosis not present

## 2016-01-15 DIAGNOSIS — N184 Chronic kidney disease, stage 4 (severe): Secondary | ICD-10-CM | POA: Diagnosis not present

## 2016-01-15 DIAGNOSIS — E1129 Type 2 diabetes mellitus with other diabetic kidney complication: Secondary | ICD-10-CM | POA: Diagnosis not present

## 2016-01-15 DIAGNOSIS — Z85038 Personal history of other malignant neoplasm of large intestine: Secondary | ICD-10-CM | POA: Diagnosis not present

## 2016-01-15 DIAGNOSIS — Z433 Encounter for attention to colostomy: Secondary | ICD-10-CM | POA: Diagnosis not present

## 2016-01-16 DIAGNOSIS — Z85038 Personal history of other malignant neoplasm of large intestine: Secondary | ICD-10-CM | POA: Diagnosis not present

## 2016-01-16 DIAGNOSIS — Z433 Encounter for attention to colostomy: Secondary | ICD-10-CM | POA: Diagnosis not present

## 2016-01-16 DIAGNOSIS — J449 Chronic obstructive pulmonary disease, unspecified: Secondary | ICD-10-CM | POA: Diagnosis not present

## 2016-01-16 DIAGNOSIS — K9400 Colostomy complication, unspecified: Secondary | ICD-10-CM | POA: Diagnosis not present

## 2016-01-16 DIAGNOSIS — N184 Chronic kidney disease, stage 4 (severe): Secondary | ICD-10-CM | POA: Diagnosis not present

## 2016-01-16 DIAGNOSIS — E1129 Type 2 diabetes mellitus with other diabetic kidney complication: Secondary | ICD-10-CM | POA: Diagnosis not present

## 2016-01-20 DIAGNOSIS — N184 Chronic kidney disease, stage 4 (severe): Secondary | ICD-10-CM | POA: Diagnosis not present

## 2016-01-20 DIAGNOSIS — E1129 Type 2 diabetes mellitus with other diabetic kidney complication: Secondary | ICD-10-CM | POA: Diagnosis not present

## 2016-01-20 DIAGNOSIS — Z433 Encounter for attention to colostomy: Secondary | ICD-10-CM | POA: Diagnosis not present

## 2016-01-20 DIAGNOSIS — K9400 Colostomy complication, unspecified: Secondary | ICD-10-CM | POA: Diagnosis not present

## 2016-01-20 DIAGNOSIS — J449 Chronic obstructive pulmonary disease, unspecified: Secondary | ICD-10-CM | POA: Diagnosis not present

## 2016-01-20 DIAGNOSIS — Z85038 Personal history of other malignant neoplasm of large intestine: Secondary | ICD-10-CM | POA: Diagnosis not present

## 2016-01-21 ENCOUNTER — Ambulatory Visit (INDEPENDENT_AMBULATORY_CARE_PROVIDER_SITE_OTHER): Payer: Medicare Other | Admitting: Endocrinology

## 2016-01-21 ENCOUNTER — Encounter: Payer: Self-pay | Admitting: Endocrinology

## 2016-01-21 VITALS — BP 130/70 | HR 103 | Ht 71.0 in | Wt 246.0 lb

## 2016-01-21 DIAGNOSIS — E1122 Type 2 diabetes mellitus with diabetic chronic kidney disease: Secondary | ICD-10-CM | POA: Diagnosis not present

## 2016-01-21 DIAGNOSIS — Z794 Long term (current) use of insulin: Secondary | ICD-10-CM | POA: Diagnosis not present

## 2016-01-21 DIAGNOSIS — Z932 Ileostomy status: Secondary | ICD-10-CM | POA: Diagnosis not present

## 2016-01-21 DIAGNOSIS — Z933 Colostomy status: Secondary | ICD-10-CM | POA: Diagnosis not present

## 2016-01-21 DIAGNOSIS — E119 Type 2 diabetes mellitus without complications: Secondary | ICD-10-CM | POA: Diagnosis not present

## 2016-01-21 DIAGNOSIS — N184 Chronic kidney disease, stage 4 (severe): Secondary | ICD-10-CM | POA: Diagnosis not present

## 2016-01-21 DIAGNOSIS — K56609 Unspecified intestinal obstruction, unspecified as to partial versus complete obstruction: Secondary | ICD-10-CM | POA: Diagnosis not present

## 2016-01-21 LAB — POCT GLYCOSYLATED HEMOGLOBIN (HGB A1C): Hemoglobin A1C: 7.1

## 2016-01-21 MED ORDER — INSULIN NPH (HUMAN) (ISOPHANE) 100 UNIT/ML ~~LOC~~ SUSP: 30.0000 [IU] | SUBCUTANEOUS | 4 refills | Status: DC

## 2016-01-21 NOTE — Patient Instructions (Addendum)
check your blood sugar twice a day.  vary the time of day when you check, between before the 3 meals, and at bedtime.  also check if you have symptoms of your blood sugar being too high or too low.  please keep a record of the readings and bring it to your next appointment here (or you can bring the meter itself).  You can write it on any piece of paper.  please call us sooner if your blood sugar goes below 70, or if you have a lot of readings over 200.   Please take the NPH insulin, 30 units each morning, no matter what your blood sugar is.  With time, your insulin requirement will probably increase more.   Please come back for a follow-up appointment in 3 months.

## 2016-01-21 NOTE — Progress Notes (Signed)
Subjective:    Patient ID: [name], male    DOB: 19-Jul-1940, 75 y.o.   MRN: [id number]  HPI Pt returns for f/u of diabetes mellitus: DM type: Insulin-requiring type 2.  Dx'ed: [id number] Complications: polyneuropathy, CAD, and renal failure.   Therapy: insulin since 2011.  DKA: never Severe hypoglycemia: never Pancreatitis: never Other: he takes QD insulin, due to h/o noncompliance; he takes human insulin, due to cost.   Interval history: he was hospitalized in June, 2017, and insulin was decreased.  Due to pt's memory loss, I asked wife, who says she gives him 0-70 units of lantus, qam.  This depends on fasting cbg.  She checks fasting only.  She gives him 70 approx half the time, and none the other half.   Past Medical History:  Diagnosis Date  . AAA (abdominal aortic aneurysm) ([place]) 1992   S/p repair and grafting  . Arthritis    "left knee"  . Benign prostatic hypertrophy    S/p TURP   . Bladder cancer (HCC)    Transitional Cell  . CAD (coronary artery disease)    a. patient's wife reports no prior stenting  . Chronic bronchitis ([place])    "used to get it quite often; found out medicine was causing it"  . Chronic systolic CHF (congestive heart failure) (HCC)    EF 40-45% by echo 06/2011  . Colitis, ulcerative ([place])    NOS  . Colon cancer ([place])   . Depression   . Diabetes mellitus   . Frequent falls   . GERD (gastroesophageal reflux disease)   . Gout   . H/O ETOH abuse 06/06/2011  . History of alcohol abuse   . Hypercholesteremia    IIA  . Normocytic anemia    Chronic  . Obesity   . PAF (paroxysmal atrial fibrillation) (HCC)    a. during hospitalization 06/2011 for sepsis; b. not on long term, full-dose anticoagulation; c. CHADS2VASc 8 (CHF, HTN, age x 2, DM, stroke x 2, vascular dz)  . Personal hx-rectal/anal malignancy   . Pleurisy   . Stones in the urinary tract   . Stroke [facility])    75 yrs old  . Uncontrolled secondary diabetes mellitus with stage 3 CKD (GFR  30-59) ([place]) 06/05/2011  . Unspecified essential hypertension   . Urinary incontinence   . Vascular dementia     Past Surgical History:  Procedure Laterality Date  . ABDOMINAL AORTIC ANEURYSM REPAIR  1992   "had 3 blockages; 3 aneurysms repaired at the same time"  . ANGIOPLASTY    . APPENDECTOMY    . BLADDER TUMOR EXCISION  2002  . CATARACT EXTRACTION W/ INTRAOCULAR LENS  IMPLANT, BILATERAL  ~ 2010  . COLONOSCOPY W/ POLYPECTOMY    . compound fracture ankle  08/17/2003  . CORONARY ANGIOPLASTY     1992  . EYE SURGERY    . I&D KNEE WITH POLY EXCHANGE  06/12/2011   Procedure: IRRIGATION AND DEBRIDEMENT KNEE WITH POLY EXCHANGE;  Surgeon: Yvette Rack., MD;  [facility];  Service: Orthopedics;  Laterality: Left;  . JOINT REPLACEMENT     left knee  . TOTAL KNEE ARTHROPLASTY  1990's   left  . TOTAL KNEE REVISION  10/26/11   left  . TOTAL KNEE REVISION  10/26/2011   Procedure: TOTAL KNEE REVISION;  Surgeon: Garald Balding, MD;  Location: [place];  Service: Orthopedics;  Laterality: Left;  left total knee revision  . TRANSURETHRAL RESECTION OF PROSTATE  Social History   Social History  . Marital status: Married    Spouse name: N/A  . Number of children: 3  . Years of education: hs   Occupational History  . Retired    Social History Main Topics  . Smoking status: Former Smoker    Packs/day: 2.00    Years: 12.00    Types: Cigarettes    Quit date: 06/04/1970  . Smokeless tobacco: Current User    Types: Chew  . Alcohol use No     Comment: Occasional  . Drug use: No  . Sexual activity: Not Currently    Birth control/ protection: None   Other Topics Concern  . Not on file   Social History Narrative   Married. Patient lives at home with his wife.   Patient is right handed.   Patient drinks 3 cups of caffeine per day.    Current Outpatient Prescriptions on File Prior to Visit  Medication Sig Dispense Refill  . albuterol (PROVENTIL HFA;VENTOLIN HFA) 108 (90 BASE)  MCG/ACT inhaler Inhale 2 puffs into the lungs every 6 (six) hours as needed for wheezing or shortness of breath. Reported on 06/18/2015    . aspirin EC 81 MG tablet Take 81 mg by mouth daily.    Marland Kitchen diltiazem (CARDIZEM CD) 240 MG 24 hr capsule Take 240 mg by mouth daily.    Marland Kitchen donepezil (ARICEPT) 10 MG tablet Take 1 tablet (10 mg total) by mouth at bedtime. 90 tablet 2  . fenofibrate micronized (LOFIBRA) 134 MG capsule Take 1 capsule by mouth daily. Reported on 10/21/2015    . finasteride (PROSCAR) 5 MG tablet     . fluocinonide cream (LIDEX) [id number] % Apply 1 application topically daily as needed.     . fluticasone (FLONASE) 50 MCG/ACT nasal spray Place 1 spray into both nostrils 2 (two) times daily as needed for allergies.     . memantine (NAMENDA TITRATION PAK) tablet pack 5 mg/day for =1 week; 5 mg twice daily for =1 week; 15 mg/day given in 5 mg and 10 mg separated doses for =1 week; then 10 mg twice daily 49 tablet 0  . memantine (NAMENDA) 5 MG tablet Take 1 tablet daily for one week then increase to 1 tablet BID thereafter. 60 tablet 5  . metoprolol tartrate (LOPRESSOR) 25 MG tablet Take 25 mg by mouth 2 (two) times daily.    . Multiple Vitamins-Minerals (CENTRUM SILVER) tablet Take 1 tablet by mouth daily.    . Omega-3 Fatty Acids (FISH OIL) 1200 MG CAPS Take 1,200 mg by mouth 2 (two) times daily.    . ondansetron (ZOFRAN) 4 MG tablet Take 4 mg by mouth every 8 (eight) hours as needed.     . pantoprazole (PROTONIX) 40 MG tablet Take 40 mg by mouth daily at 12 noon.    Marland Kitchen PARoxetine (PAXIL) 40 MG tablet Take 20 mg by mouth daily.     . rosuvastatin (CRESTOR) 20 MG tablet Take 10 mg by mouth at bedtime.     Marland Kitchen UNKNOWN TO PATIENT Ferrous sulfate 65mg  po daily    . furosemide (LASIX) 40 MG tablet Take 40 mg by mouth daily.     No current facility-administered medications on file prior to visit.     Allergies  Allergen Reactions  . Fentanyl Other (See Comments)    Delirium  . Hydromorphone Hcl  Other (See Comments)    Delirium  . Lorazepam Other (See Comments)    Delirium  . Oxycodone-Acetaminophen  Other (See Comments)    Delirium  . Lisinopril   . Vicodin [Hydrocodone-Acetaminophen]     Family History  Problem Relation Age of Onset  . Dementia Mother   . COPD Father   . Anesthesia problems Neg Hx   . Dementia Sister   . Cancer Brother   . Cancer Brother   . Dementia Brother   . Diabetes Neg Hx     BP 130/70   Pulse (!) 103   Ht 5\' 11"  (1.803 m)   Wt 246 lb (111.6 kg)   SpO2 97%   BMI 34.31 kg/m   Review of Systems She says he has no hypoglycemia.      Objective:   Physical Exam VITAL SIGNS:  See vs page GENERAL: no distress.  In wheelchair.   Pulses: dorsalis pedis intact bilat.   MSK: no deformity of the feet.  CV: no leg edema.   Skin:  no ulcer on the feet.  normal color and temp on the feet.  Neuro: sensation is intact to touch on the feet, but decreased from normal.   Ext: There is bilateral onychomycosis of the toenails.    Lab Results  Component Value Date   CREATININE 2.20 (H) 09/30/2015   BUN 22 (H) 09/30/2015   NA 133 (L) 09/30/2015   K 4.6 09/30/2015   CL 98 (L) 09/30/2015   CO2 25 09/30/2015  a1c=7.1%    Assessment & Plan:  Insulin-requiring type 2 DM: his insulin requirement is increasing since his hospitalization a few mos ago Memory loss: in this context, he is not a candidate for aggressive glycemic control. Renal failure: in this context, he has a long duration of action of insulin, so we'll have to increase insulin slowly.    Patient is advised the following: Patient Instructions  check your blood sugar twice a day.  vary the time of day when you check, between before the 3 meals, and at bedtime.  also check if you have symptoms of your blood sugar being too high or too low.  please keep a record of the readings and bring it to your next appointment here (or you can bring the meter itself).  You can write it on any piece of  paper.  please call us sooner if your blood sugar goes below 70, or if you have a lot of readings over 200.   Please take the NPH insulin, 30 units each morning, no matter what your blood sugar is.  With time, your insulin requirement will probably increase more.   Please come back for a follow-up appointment in 3 months.

## 2016-01-22 DIAGNOSIS — Z85038 Personal history of other malignant neoplasm of large intestine: Secondary | ICD-10-CM | POA: Diagnosis not present

## 2016-01-22 DIAGNOSIS — J449 Chronic obstructive pulmonary disease, unspecified: Secondary | ICD-10-CM | POA: Diagnosis not present

## 2016-01-22 DIAGNOSIS — Z433 Encounter for attention to colostomy: Secondary | ICD-10-CM | POA: Diagnosis not present

## 2016-01-22 DIAGNOSIS — N184 Chronic kidney disease, stage 4 (severe): Secondary | ICD-10-CM | POA: Diagnosis not present

## 2016-01-22 DIAGNOSIS — E1129 Type 2 diabetes mellitus with other diabetic kidney complication: Secondary | ICD-10-CM | POA: Diagnosis not present

## 2016-01-22 DIAGNOSIS — K9400 Colostomy complication, unspecified: Secondary | ICD-10-CM | POA: Diagnosis not present

## 2016-01-27 ENCOUNTER — Telehealth: Payer: Self-pay

## 2016-01-27 DIAGNOSIS — K9400 Colostomy complication, unspecified: Secondary | ICD-10-CM | POA: Diagnosis not present

## 2016-01-27 DIAGNOSIS — N184 Chronic kidney disease, stage 4 (severe): Secondary | ICD-10-CM | POA: Diagnosis not present

## 2016-01-27 DIAGNOSIS — J449 Chronic obstructive pulmonary disease, unspecified: Secondary | ICD-10-CM | POA: Diagnosis not present

## 2016-01-27 DIAGNOSIS — Z433 Encounter for attention to colostomy: Secondary | ICD-10-CM | POA: Diagnosis not present

## 2016-01-27 DIAGNOSIS — E1129 Type 2 diabetes mellitus with other diabetic kidney complication: Secondary | ICD-10-CM | POA: Diagnosis not present

## 2016-01-27 DIAGNOSIS — Z85038 Personal history of other malignant neoplasm of large intestine: Secondary | ICD-10-CM | POA: Diagnosis not present

## 2016-01-27 MED ORDER — DONEPEZIL HCL 10 MG PO TABS: 10.0000 mg | ORAL_TABLET | Freq: Every day | ORAL | 2 refills | Status: DC

## 2016-01-27 NOTE — Telephone Encounter (Signed)
Refill request

## 2016-01-28 DIAGNOSIS — K9400 Colostomy complication, unspecified: Secondary | ICD-10-CM | POA: Diagnosis not present

## 2016-01-28 DIAGNOSIS — N184 Chronic kidney disease, stage 4 (severe): Secondary | ICD-10-CM | POA: Diagnosis not present

## 2016-01-28 DIAGNOSIS — Z433 Encounter for attention to colostomy: Secondary | ICD-10-CM | POA: Diagnosis not present

## 2016-01-28 DIAGNOSIS — J449 Chronic obstructive pulmonary disease, unspecified: Secondary | ICD-10-CM | POA: Diagnosis not present

## 2016-01-28 DIAGNOSIS — Z85038 Personal history of other malignant neoplasm of large intestine: Secondary | ICD-10-CM | POA: Diagnosis not present

## 2016-01-28 DIAGNOSIS — E1129 Type 2 diabetes mellitus with other diabetic kidney complication: Secondary | ICD-10-CM | POA: Diagnosis not present

## 2016-01-29 DIAGNOSIS — N184 Chronic kidney disease, stage 4 (severe): Secondary | ICD-10-CM | POA: Diagnosis not present

## 2016-01-29 DIAGNOSIS — E1129 Type 2 diabetes mellitus with other diabetic kidney complication: Secondary | ICD-10-CM | POA: Diagnosis not present

## 2016-01-29 DIAGNOSIS — J449 Chronic obstructive pulmonary disease, unspecified: Secondary | ICD-10-CM | POA: Diagnosis not present

## 2016-01-29 DIAGNOSIS — Z85038 Personal history of other malignant neoplasm of large intestine: Secondary | ICD-10-CM | POA: Diagnosis not present

## 2016-01-29 DIAGNOSIS — K9400 Colostomy complication, unspecified: Secondary | ICD-10-CM | POA: Diagnosis not present

## 2016-01-29 DIAGNOSIS — Z433 Encounter for attention to colostomy: Secondary | ICD-10-CM | POA: Diagnosis not present

## 2016-02-03 DIAGNOSIS — K56609 Unspecified intestinal obstruction, unspecified as to partial versus complete obstruction: Secondary | ICD-10-CM | POA: Diagnosis not present

## 2016-02-03 DIAGNOSIS — E119 Type 2 diabetes mellitus without complications: Secondary | ICD-10-CM | POA: Diagnosis not present

## 2016-02-03 DIAGNOSIS — Z932 Ileostomy status: Secondary | ICD-10-CM | POA: Diagnosis not present

## 2016-02-03 DIAGNOSIS — Z933 Colostomy status: Secondary | ICD-10-CM | POA: Diagnosis not present

## 2016-04-06 NOTE — Progress Notes (Signed)
PATIENT: [name] DOB: 13-Jul-1940  REASON FOR VISIT: follow up- mild gait disorder, memory disturbance HISTORY FROM: patient  HISTORY OF PRESENT ILLNESS: Today 04/07/2016: Mr. Guidos is a 76 year old male with a history of mild gait disorder and memory disturbance. He returns today for follow-up. The patient is currently on Aricept and Namenda and tolerating both well. The patient and his wife feel that his memory has remained stable. He is back at home now. He still requires assistance with most all ADLs. He does not operate a motor vehicle. His wife completes all finances and does all the cooking. Denies any trouble sleeping. Denies any changes with his behavior. Denies agitation. The wife reports that if he does become agitated she normally can distract him. The patient uses a wheelchair for longer distances but a cane around his home. Denies any falls. Patient returns today for an evaluation.   HISTORY Mr. Dotzler is a 76 year old male with a history of mild gait disorder and memory disturbance. He returns today for follow-up. The patient is on Aricept and tolerating this well. He denies any changes with his memory. His wife feels as though things have declined. He requires assistance with all ADLs. He does not operate a motor vehicle. Wife states that he does become easily agitated. She states that she typically just changes the subject or walks away and the patient calms down. Since the last visit the patient developed a bowel obstruction. He is now at [place] undergoing rehabilitation. He is in a wheelchair today. He returns today for an evaluation.  HISTORY 01/30/15: Mr. Burkhard is a 76 year old right-handed white male with a history of a mild gait disorder. The patient has had a knee surgery on the left, he will occasionally have collapse of the left knee. The patient has fallen on several occasions, the last fall was about 2 weeks prior to this evaluation. The patient  did not sustain severe injury. The patient has a memory disturbance, he is on Aricept for this, and has tolerated the medication well. The patient has been able to maintain his cognitive functioning since last seen 6 months ago. The patient still operates a motor vehicle, no problems with directions have been noted. The patient requires some assistance managing his medications, appointments, and keeping up with finances. His wife does these tasks. He returns for an evaluation.  REVIEW OF SYSTEMS: Out of a complete 14 system review of symptoms, the patient complains only of the following symptoms, and all other reviewed systems are negative.  Back pain, walking difficulty, agitation, behavior problem, confusion  ALLERGIES: Allergies  Allergen Reactions  . Fentanyl Other (See Comments)    Delirium  . Hydromorphone Hcl Other (See Comments)    Delirium  . Lorazepam Other (See Comments)    Delirium  . Oxycodone-Acetaminophen Other (See Comments)    Delirium  . Lisinopril   . Vicodin [Hydrocodone-Acetaminophen]     HOME MEDICATIONS: Outpatient Medications Prior to Visit  Medication Sig Dispense Refill  . albuterol (PROVENTIL HFA;VENTOLIN HFA) 108 (90 BASE) MCG/ACT inhaler Inhale 2 puffs into the lungs every 6 (six) hours as needed for wheezing or shortness of breath. Reported on 06/18/2015    . aspirin EC 81 MG tablet Take 81 mg by mouth daily.    Marland Kitchen diltiazem (CARDIZEM CD) 240 MG 24 hr capsule Take 240 mg by mouth daily.    Marland Kitchen donepezil (ARICEPT) 10 MG tablet Take 1 tablet (10 mg total) by mouth at bedtime. [place]  tablet 2  . fenofibrate micronized (LOFIBRA) 134 MG capsule Take 1 capsule by mouth daily. Reported on 10/21/2015    . finasteride (PROSCAR) 5 MG tablet     . fluocinonide cream (LIDEX) [id number] % Apply 1 application topically daily as needed.     . fluticasone (FLONASE) 50 MCG/ACT nasal spray Place 1 spray into both nostrils 2 (two) times daily as needed for allergies.     . furosemide  (LASIX) 40 MG tablet Take 40 mg by mouth daily.    . insulin NPH Human (HUMULIN N) 100 UNIT/ML injection Inject 0.3 mLs (30 Units total) into the skin every morning. 30 mL 4  . memantine (NAMENDA TITRATION PAK) tablet pack 5 mg/day for =1 week; 5 mg twice daily for =1 week; 15 mg/day given in 5 mg and 10 mg separated doses for =1 week; then 10 mg twice daily 49 tablet 0  . memantine (NAMENDA) 5 MG tablet Take 1 tablet daily for one week then increase to 1 tablet BID thereafter. 60 tablet 5  . metoprolol tartrate (LOPRESSOR) 25 MG tablet Take 25 mg by mouth 2 (two) times daily.    . Multiple Vitamins-Minerals (CENTRUM SILVER) tablet Take 1 tablet by mouth daily.    . Omega-3 Fatty Acids (FISH OIL) 1200 MG CAPS Take 1,200 mg by mouth 2 (two) times daily.    . ondansetron (ZOFRAN) 4 MG tablet Take 4 mg by mouth every 8 (eight) hours as needed.     . pantoprazole (PROTONIX) 40 MG tablet Take 40 mg by mouth daily at 12 noon.    Marland Kitchen PARoxetine (PAXIL) 40 MG tablet Take 20 mg by mouth daily.     . rosuvastatin (CRESTOR) 20 MG tablet Take 10 mg by mouth at bedtime.     Marland Kitchen UNKNOWN TO PATIENT Ferrous sulfate 65mg  po daily     No facility-administered medications prior to visit.     PAST MEDICAL HISTORY: Past Medical History:  Diagnosis Date  . AAA (abdominal aortic aneurysm) ([place]) 1992   S/p repair and grafting  . Arthritis    "left knee"  . Benign prostatic hypertrophy    S/p TURP   . Bladder cancer (HCC)    Transitional Cell  . CAD (coronary artery disease)    a. patient's wife reports no prior stenting  . Chronic bronchitis ([place])    "used to get it quite often; found out medicine was causing it"  . Chronic systolic CHF (congestive heart failure) (HCC)    EF 40-45% by echo 06/2011  . Colitis, ulcerative ([place])    NOS  . Colon cancer ([place])   . Depression   . Diabetes mellitus   . Frequent falls   . GERD (gastroesophageal reflux disease)   . Gout   . H/O ETOH abuse 06/06/2011  . History of  alcohol abuse   . Hypercholesteremia    IIA  . Normocytic anemia    Chronic  . Obesity   . PAF (paroxysmal atrial fibrillation) (HCC)    a. during hospitalization 06/2011 for sepsis; b. not on long term, full-dose anticoagulation; c. CHADS2VASc 8 (CHF, HTN, age x 2, DM, stroke x 2, vascular dz)  . Personal hx-rectal/anal malignancy   . Pleurisy   . Stones in the urinary tract   . Stroke [facility])    76 yrs old  . Uncontrolled secondary diabetes mellitus with stage 3 CKD (GFR 30-59) ([place]) 06/05/2011  . Unspecified essential hypertension   . Urinary incontinence   .  Vascular dementia     PAST SURGICAL HISTORY: Past Surgical History:  Procedure Laterality Date  . ABDOMINAL AORTIC ANEURYSM REPAIR  1992   "had 3 blockages; 3 aneurysms repaired at the same time"  . ANGIOPLASTY    . APPENDECTOMY    . BLADDER TUMOR EXCISION  2002  . CATARACT EXTRACTION W/ INTRAOCULAR LENS  IMPLANT, BILATERAL  ~ 2010  . COLONOSCOPY W/ POLYPECTOMY    . compound fracture ankle  08/17/2003  . CORONARY ANGIOPLASTY     1992  . EYE SURGERY    . I&D KNEE WITH POLY EXCHANGE  06/12/2011   Procedure: IRRIGATION AND DEBRIDEMENT KNEE WITH POLY EXCHANGE;  Surgeon: Yvette Rack., MD;  Location: [facility];  Service: Orthopedics;  Laterality: Left;  . JOINT REPLACEMENT     left knee  . TOTAL KNEE ARTHROPLASTY  1990's   left  . TOTAL KNEE REVISION  10/26/11   left  . TOTAL KNEE REVISION  10/26/2011   Procedure: TOTAL KNEE REVISION;  Surgeon: Garald Balding, MD;  Location: [facility];  Service: Orthopedics;  Laterality: Left;  left total knee revision  . TRANSURETHRAL RESECTION OF PROSTATE      FAMILY HISTORY: Family History  Problem Relation Age of Onset  . Dementia Mother   . COPD Father   . Anesthesia problems Neg Hx   . Dementia Sister   . Cancer Brother   . Cancer Brother   . Dementia Brother   . Diabetes Neg Hx     SOCIAL HISTORY: Social History   Social History  . Marital status: Married    Spouse name:  N/A  . Number of children: 3  . Years of education: hs   Occupational History  . Retired    Social History Main Topics  . Smoking status: Former Smoker    Packs/day: 2.00    Years: 12.00    Types: Cigarettes    Quit date: 06/04/1970  . Smokeless tobacco: Current User    Types: Chew  . Alcohol use No     Comment: Occasional  . Drug use: No  . Sexual activity: Not Currently    Birth control/ protection: None   Other Topics Concern  . Not on file   Social History Narrative   Married. Patient lives at home with his wife.   Patient is right handed.   Patient drinks 3 cups of caffeine per day.      PHYSICAL EXAM  Vitals:   04/07/16 1123  BP: 133/72  Pulse: 68  Weight: 253 lb 9.6 oz (115 kg)  Height: 5\' 11"  (1.803 m)   Body mass index is 35.37 kg/m.     Generalized: Well developed, in no acute distress   Neurological examination  Mentation: Alert. oriented to time, place, history taking. Follows all commands speech and language fluent. MMSE 18/30 Cranial nerve II-XII: Pupils were equal round reactive to light. Extraocular movements were full, visual field were full on confrontational test. Facial sensation and strength were normal. Uvula tongue midline. Head turning and shoulder shrug  were normal and symmetric. Motor: The motor testing reveals 5 over 5 strength of all 4 extremities. Good symmetric motor tone is noted throughout.  Sensory: Sensory testing is intact to soft touch on all 4 extremities. No evidence of extinction is noted.  Coordination: Cerebellar testing reveals good finger-nose-finger and heel-to-shin bilaterally.  Gait and station: Patient is in a wheelchair. Reflexes: Deep tendon reflexes are symmetric and normal bilaterally.   DIAGNOSTIC  DATA (LABS, IMAGING, TESTING) - I reviewed patient records, labs, notes, testing and imaging myself where available.  Lab Results  Component Value Date   WBC 7.3 09/30/2015   HGB 12.1 (L) 09/30/2015   HCT 34.9  (L) 09/30/2015   MCV 88.3 09/30/2015   PLT 269 09/30/2015      Component Value Date/Time   NA 133 (L) 09/30/2015 1527   NA 133 (L) 02/05/2014 1043   K 4.6 09/30/2015 1527   K 4.9 02/05/2014 1043   CL 98 (L) 09/30/2015 1527   CO2 25 09/30/2015 1527   CO2 21 (L) 02/05/2014 1043   GLUCOSE 99 09/30/2015 1527   GLUCOSE 476 (H) 02/05/2014 1043   BUN 22 (H) 09/30/2015 1527   BUN 26.9 (H) 02/05/2014 1043   CREATININE 2.20 (H) 09/30/2015 1527   CREATININE 2.5 (H) 02/05/2014 1043   CALCIUM 9.8 09/30/2015 1527   CALCIUM 9.7 02/05/2014 1043   PROT 7.8 09/30/2015 1527   PROT 6.8 02/05/2014 1043   ALBUMIN 4.2 09/30/2015 1527   ALBUMIN 4.0 02/05/2014 1043   AST 28 09/30/2015 1527   AST 22 02/05/2014 1043   ALT 26 09/30/2015 1527   ALT 22 02/05/2014 1043   ALKPHOS 58 09/30/2015 1527   ALKPHOS 53 02/05/2014 1043   BILITOT 0.7 09/30/2015 1527   BILITOT 0.49 02/05/2014 1043   GFRNONAA 27 (L) 09/30/2015 1527   GFRAA 32 (L) 09/30/2015 1527    Lab Results  Component Value Date   HGBA1C 7.1 01/21/2016    Lab Results  Component Value Date   TSH 1.851 09/08/2015      ASSESSMENT AND PLAN 76 y.o. year old male  has a past medical history of AAA (abdominal aortic aneurysm) ([place]) (1992); Arthritis; Benign prostatic hypertrophy; Bladder cancer ([place]); CAD (coronary artery disease); Chronic bronchitis ([place]); Chronic systolic CHF (congestive heart failure) ([place]); Colitis, ulcerative ([place]); Colon cancer ([place]); Depression; Diabetes mellitus; Frequent falls; GERD (gastroesophageal reflux disease); Gout; H/O ETOH abuse (06/06/2011); History of alcohol abuse; Hypercholesteremia; Normocytic anemia; Obesity; PAF (paroxysmal atrial fibrillation) ([place]); Personal hx-rectal/anal malignancy; Pleurisy; Stones in the urinary tract; Stroke [facility]); 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  here with:  1. Memory disturbance 2. Abnormality of gait  Patient's memory score has remained stable. He will remain on Aricept and Namenda. Wife is unsure if he is on the 5 mg or 10 mg tablet of Namenda. She will check her prescription at home and call us back with this information. The patient is encouraged to continue using his cane when ambulating. Advised that if his symptoms worsen or he develops new symptoms he should let us know. Follow-up in 6 months or sooner if needed.   Ward Givens, MSN, NP-C 04/06/2016, 4:57 PM [facility] [address], [place], [place] [address] [phone]

## 2016-04-07 ENCOUNTER — Ambulatory Visit (INDEPENDENT_AMBULATORY_CARE_PROVIDER_SITE_OTHER): Payer: Medicare Other | Admitting: Adult Health

## 2016-04-07 ENCOUNTER — Telehealth: Payer: Self-pay | Admitting: Adult Health

## 2016-04-07 ENCOUNTER — Encounter: Payer: Self-pay | Admitting: Adult Health

## 2016-04-07 VITALS — BP 133/72 | HR 68 | Ht 71.0 in | Wt 253.6 lb

## 2016-04-07 DIAGNOSIS — R413 Other amnesia: Secondary | ICD-10-CM

## 2016-04-07 DIAGNOSIS — R269 Unspecified abnormalities of gait and mobility: Secondary | ICD-10-CM

## 2016-04-07 MED ORDER — MEMANTINE HCL 5 MG PO TABS: ORAL_TABLET | ORAL | 5 refills | Status: DC

## 2016-04-07 NOTE — Addendum Note (Signed)
Addended by: Trudie Buckler on: 04/07/2016 04:21 PM   Modules accepted: Orders

## 2016-04-07 NOTE — Progress Notes (Signed)
I have read the note, and I agree with the clinical assessment and plan.  [name],[name]   

## 2016-04-07 NOTE — Telephone Encounter (Signed)
Megan- I believe you were waiting to hear from them about this, thanks

## 2016-04-07 NOTE — Telephone Encounter (Signed)
Please advise the patient's wife that the patient should increase his dose to one tablet in the morning and 2 tablets at night for 1 week then increase to 2 tablets twice a day. I sent in a new prescription.

## 2016-04-07 NOTE — Telephone Encounter (Signed)
Called and spoke to wife on mobile number. She stated she was on home number when I called originally.  Went over instructions per MM,NP for namenda. She was able to read back instructions correctly. She has no further questions at this time.

## 2016-04-07 NOTE — Telephone Encounter (Signed)
Tried calling wife back. Phone continued to ring, unable to LVM.

## 2016-04-07 NOTE — Patient Instructions (Signed)
Memory score stable Check Namenda to see if its 5 mg or 10 mg tablet If your symptoms worsen or you develop new symptoms please let us know.

## 2016-04-07 NOTE — Telephone Encounter (Signed)
Patient's wife is  calling stating the patient takes memantine (NAMENDA) 5 MG tablet 1 tablet twice a day. Megan wanted to know this from visit today.

## 2016-04-08 ENCOUNTER — Encounter: Payer: Self-pay | Admitting: Adult Health

## 2016-04-08 ENCOUNTER — Telehealth: Payer: Self-pay | Admitting: Endocrinology

## 2016-04-08 NOTE — Telephone Encounter (Signed)
I contacted the patient's wife and advised of message. She voiced understanding and will call back to report blood sugar readings.

## 2016-04-08 NOTE — Telephone Encounter (Signed)
BS readings  1/2 323  1/3 380  1/4 383

## 2016-04-08 NOTE — Telephone Encounter (Signed)
See message, could you advise during Dr. Cordelia Pen absence? Patient is currently taking 30 units of Humulin every morning.  Thanks!

## 2016-04-08 NOTE — Telephone Encounter (Signed)
Requested a call back from the patient to further discuss.  

## 2016-04-08 NOTE — Telephone Encounter (Signed)
Patient b/s been running over 300 hundreds. Please on what to do.

## 2016-04-08 NOTE — Telephone Encounter (Signed)
I called the spouse. Explained the difference between vascular dementia and alzheimer dementia. Also explained the patient was not on the maintenance dose of Namenda at that is why it was increased. All questions were answered. Patient's wife advised to call if she has any additional questions

## 2016-04-08 NOTE — Telephone Encounter (Signed)
If those sugars are all checked in the morning, I would add 10 units of NPH at bedtime. Please let us know about the sugars in 3 days.

## 2016-04-13 ENCOUNTER — Telehealth: Payer: Self-pay | Admitting: Adult Health

## 2016-04-13 NOTE — Telephone Encounter (Signed)
[place] [facility] [phone] called sts memantine (NAMENDA) 5 MG tablet needs PA. She also advised a fax was sent yesterday. She said the request will expire this Thursday 04/15/16. She asking for a call back asap.

## 2016-04-14 NOTE — Telephone Encounter (Signed)
LMVM for [name] at [facility], to let them know that memantine was approved.  To return call if questions.

## 2016-04-14 NOTE — Telephone Encounter (Signed)
I called and spoke to [facility] with [employer] relating to memantine for pt.  Was seen 04-07-16 for vascular dementia.  Pt was taking 5 mg tabs and was to titrate to 2 - 5mg  tabs  Po bid over this week.  I told Vaughan Basta to make 10mg  tabs and pt to take 10mg  po bid.  She stated that will make decision in 24-48 hours.  [place] [phone].

## 2016-04-14 NOTE — Telephone Encounter (Addendum)
Received [employer] [facility] approval for Memantine 5mg  tabs.  Take 2 tabs po bid.  [place] [id number] thru 04/04/2017 MCR part D. [phone].

## 2016-04-21 ENCOUNTER — Ambulatory Visit: Payer: Medicare Other | Admitting: Endocrinology

## 2016-04-25 NOTE — Progress Notes (Signed)
Subjective:    Patient ID: [name], male    DOB: 04/02/1941, 76 y.o.   MRN: [id number]  HPI Pt returns for f/u of diabetes mellitus: DM type: Insulin-requiring type 2.  Dx'ed: [id number] Complications: polyneuropathy, CAD, and renal failure.   Therapy: insulin since 2011.  DKA: never Severe hypoglycemia: never Pancreatitis: never Other: he takes QD insulin, due to h/o noncompliance; he takes human insulin, due to cost.   Interval history: he was hospitalized in June, 2017, and insulin was decreased.  Due to pt's memory loss, I asked wife, who says she never misses giving him insulin.   she brings a record of his cbg's which i have reviewed today.  It varies 218-400.  There is no trend throughout the day.   Past Medical History:  Diagnosis Date  . AAA (abdominal aortic aneurysm) ([place]) 1992   S/p repair and grafting  . Arthritis    "left knee"  . Benign prostatic hypertrophy    S/p TURP   . Bladder cancer (HCC)    Transitional Cell  . CAD (coronary artery disease)    a. patient's wife reports no prior stenting  . Chronic bronchitis ([place])    "used to get it quite often; found out medicine was causing it"  . Chronic systolic CHF (congestive heart failure) (HCC)    EF 40-45% by echo 06/2011  . Colitis, ulcerative ([place])    NOS  . Colon cancer ([place])   . Depression   . Diabetes mellitus   . Frequent falls   . GERD (gastroesophageal reflux disease)   . Gout   . H/O ETOH abuse 06/06/2011  . History of alcohol abuse   . Hypercholesteremia    IIA  . Normocytic anemia    Chronic  . Obesity   . PAF (paroxysmal atrial fibrillation) (HCC)    a. during hospitalization 06/2011 for sepsis; b. not on long term, full-dose anticoagulation; c. CHADS2VASc 8 (CHF, HTN, age x 2, DM, stroke x 2, vascular dz)  . Personal hx-rectal/anal malignancy   . Pleurisy   . Stones in the urinary tract   . Stroke [facility])    76 yrs old  . Uncontrolled secondary diabetes mellitus with stage 3 CKD (GFR  30-59) ([place]) 06/05/2011  . Unspecified essential hypertension   . Urinary incontinence   . Vascular dementia     Past Surgical History:  Procedure Laterality Date  . ABDOMINAL AORTIC ANEURYSM REPAIR  1992   "had 3 blockages; 3 aneurysms repaired at the same time"  . ANGIOPLASTY    . APPENDECTOMY    . BLADDER TUMOR EXCISION  2002  . CATARACT EXTRACTION W/ INTRAOCULAR LENS  IMPLANT, BILATERAL  ~ 2010  . COLONOSCOPY W/ POLYPECTOMY    . compound fracture ankle  08/17/2003  . CORONARY ANGIOPLASTY     1992  . EYE SURGERY    . I&D KNEE WITH POLY EXCHANGE  06/12/2011   Procedure: IRRIGATION AND DEBRIDEMENT KNEE WITH POLY EXCHANGE;  Surgeon: Yvette Rack., MD;  [facility];  Service: Orthopedics;  Laterality: Left;  . JOINT REPLACEMENT     left knee  . TOTAL KNEE ARTHROPLASTY  1990's   left  . TOTAL KNEE REVISION  10/26/11   left  . TOTAL KNEE REVISION  10/26/2011   Procedure: TOTAL KNEE REVISION;  Surgeon: Garald Balding, MD;  Location: [facility];  Service: Orthopedics;  Laterality: Left;  left total knee revision  . TRANSURETHRAL RESECTION OF PROSTATE  Social History   Social History  . Marital status: Married    Spouse name: N/A  . Number of children: 3  . Years of education: hs   Occupational History  . Retired    Social History Main Topics  . Smoking status: Former Smoker    Packs/day: 2.00    Years: 12.00    Types: Cigarettes    Quit date: 06/04/1970  . Smokeless tobacco: Current User    Types: Chew  . Alcohol use No     Comment: Occasional  . Drug use: No  . Sexual activity: Not Currently    Birth control/ protection: None   Other Topics Concern  . Not on file   Social History Narrative   Married. Patient lives at home with his wife.   Patient is right handed.   Patient drinks 3 cups of caffeine per day.    Current Outpatient Prescriptions on File Prior to Visit  Medication Sig Dispense Refill  . albuterol (PROVENTIL HFA;VENTOLIN HFA) 108 (90 BASE)  MCG/ACT inhaler Inhale 2 puffs into the lungs every 6 (six) hours as needed for wheezing or shortness of breath. Reported on 06/18/2015    . aspirin EC 81 MG tablet Take 81 mg by mouth daily.    Marland Kitchen diltiazem (CARDIZEM CD) 240 MG 24 hr capsule Take 240 mg by mouth daily.    Marland Kitchen donepezil (ARICEPT) 10 MG tablet Take 1 tablet (10 mg total) by mouth at bedtime. 90 tablet 2  . finasteride (PROSCAR) 5 MG tablet     . fluocinonide cream (LIDEX) [id number] % Apply 1 application topically daily as needed.     . fluticasone (FLONASE) 50 MCG/ACT nasal spray Place 1 spray into both nostrils 2 (two) times daily as needed for allergies.     . memantine (NAMENDA) 5 MG tablet Take 1 tablet in the morning and 2 tablets at bedtime for 1 week, increase to 2 tablets BID 120 tablet 5  . metoprolol tartrate (LOPRESSOR) 25 MG tablet Take 25 mg by mouth 2 (two) times daily.    . Multiple Vitamins-Minerals (CENTRUM SILVER) tablet Take 1 tablet by mouth daily.    . Omega-3 Fatty Acids (FISH OIL) 1200 MG CAPS Take 1,200 mg by mouth 2 (two) times daily.    . pantoprazole (PROTONIX) 40 MG tablet Take 40 mg by mouth daily at 12 noon.    Marland Kitchen PARoxetine (PAXIL) 40 MG tablet Take 20 mg by mouth daily.     . rosuvastatin (CRESTOR) 20 MG tablet Take 10 mg by mouth at bedtime.     Marland Kitchen UNKNOWN TO PATIENT Ferrous sulfate 65mg  po daily     No current facility-administered medications on file prior to visit.     Allergies  Allergen Reactions  . Fentanyl Other (See Comments)    Delirium  . Hydromorphone Hcl Other (See Comments)    Delirium  . Lorazepam Other (See Comments)    Delirium  . Oxycodone-Acetaminophen Other (See Comments)    Delirium  . Lisinopril   . Vicodin [Hydrocodone-Acetaminophen]     Family History  Problem Relation Age of Onset  . Dementia Mother   . COPD Father   . Dementia Sister   . Cancer Brother   . Cancer Brother   . Dementia Brother   . Anesthesia problems Neg Hx   . Diabetes Neg Hx     BP 128/78    Pulse 92   Ht 5\' 11"  (1.803 m)   Wt 251 lb (113.9  kg)   SpO2 96%   BMI 35.01 kg/m    Review of Systems He has gained 5 lbs.      Objective:   Physical Exam VITAL SIGNS:  See vs page GENERAL: no distress.  In wheelchair.   Pulses: dorsalis pedis intact bilat.   MSK: no deformity of the feet.  CV: no leg edema.   Skin:  no ulcer on the feet.  normal color and temp on the feet.  Neuro: sensation is intact to touch on the feet, but decreased from normal.    Ext: There is bilateral onychomycosis of the toenails.   Lab Results  Component Value Date   CREATININE 2.20 (H) 09/30/2015   BUN 22 (H) 09/30/2015   NA 133 (L) 09/30/2015   K 4.6 09/30/2015   CL 98 (L) 09/30/2015   CO2 25 09/30/2015      Assessment & Plan:  Insulin-requiring type 2 DM: his insulin requirement is re-increasing since his hospitalization a few mos ago.  Memory loss: in this context, he is not a candidate for aggressive glycemic control.  Renal failure: in this context, he has a long duration of action of insulin, so we'll have to increase insulin slowly.    Patient is advised the following: Patient Instructions  check your blood sugar twice a day.  vary the time of day when you check, between before the 3 meals, and at bedtime.  also check if you have symptoms of your blood sugar being too high or too low.  please keep a record of the readings and bring it to your next appointment here (or you can bring the meter itself).  You can write it on any piece of paper.  please call us sooner if your blood sugar goes below 70, or if you have a lot of readings over 200.   Please increase the NPH insulin to 60 units each morning, no matter what your blood sugar is.  Please call us next week, to tell us how the blood sugar is doing.   With time, your insulin requirement will probably increase more.   Please come back for a follow-up appointment in 3 months.

## 2016-04-28 ENCOUNTER — Encounter: Payer: Self-pay | Admitting: Adult Health

## 2016-04-28 ENCOUNTER — Encounter: Payer: Self-pay | Admitting: Neurology

## 2016-04-28 NOTE — Telephone Encounter (Signed)
Letter completed, signed, up front for pick-up.

## 2016-04-29 ENCOUNTER — Ambulatory Visit (INDEPENDENT_AMBULATORY_CARE_PROVIDER_SITE_OTHER): Payer: Medicare Other | Admitting: Endocrinology

## 2016-04-29 ENCOUNTER — Encounter: Payer: Self-pay | Admitting: Endocrinology

## 2016-04-29 VITALS — BP 128/78 | HR 92 | Ht 71.0 in | Wt 251.0 lb

## 2016-04-29 DIAGNOSIS — Z794 Long term (current) use of insulin: Secondary | ICD-10-CM | POA: Diagnosis not present

## 2016-04-29 DIAGNOSIS — E1122 Type 2 diabetes mellitus with diabetic chronic kidney disease: Secondary | ICD-10-CM | POA: Diagnosis not present

## 2016-04-29 DIAGNOSIS — N184 Chronic kidney disease, stage 4 (severe): Secondary | ICD-10-CM

## 2016-04-29 LAB — POCT GLYCOSYLATED HEMOGLOBIN (HGB A1C): HEMOGLOBIN A1C: 9.4

## 2016-04-29 MED ORDER — INSULIN NPH (HUMAN) (ISOPHANE) 100 UNIT/ML ~~LOC~~ SUSP: 60.0000 [IU] | SUBCUTANEOUS | 3 refills | Status: DC

## 2016-04-29 NOTE — Patient Instructions (Addendum)
check your blood sugar twice a day.  vary the time of day when you check, between before the 3 meals, and at bedtime.  also check if you have symptoms of your blood sugar being too high or too low.  please keep a record of the readings and bring it to your next appointment here (or you can bring the meter itself).  You can write it on any piece of paper.  please call us sooner if your blood sugar goes below 70, or if you have a lot of readings over 200.   Please increase the NPH insulin to 60 units each morning, no matter what your blood sugar is.  Please call us next week, to tell us how the blood sugar is doing.   With time, your insulin requirement will probably increase more.   Please come back for a follow-up appointment in 3 months.

## 2016-05-28 ENCOUNTER — Encounter: Payer: Self-pay | Admitting: Adult Health

## 2016-06-03 ENCOUNTER — Other Ambulatory Visit: Payer: Self-pay | Admitting: Neurology

## 2016-06-03 DIAGNOSIS — F0280 Dementia in other diseases classified elsewhere without behavioral disturbance: Secondary | ICD-10-CM

## 2016-06-03 DIAGNOSIS — G301 Alzheimer's disease with late onset: Principal | ICD-10-CM

## 2016-07-07 ENCOUNTER — Observation Stay (HOSPITAL_COMMUNITY)
Admission: EM | Admit: 2016-07-07 | Discharge: 2016-07-10 | Disposition: A | Discharge status: Home / Self Care | Payer: Medicare Other | Attending: Internal Medicine | Admitting: Internal Medicine

## 2016-07-07 ENCOUNTER — Emergency Department (HOSPITAL_COMMUNITY): Payer: Medicare Other

## 2016-07-07 ENCOUNTER — Encounter (HOSPITAL_COMMUNITY): Payer: Self-pay | Admitting: Internal Medicine

## 2016-07-07 DIAGNOSIS — Z932 Ileostomy status: Secondary | ICD-10-CM | POA: Insufficient documentation

## 2016-07-07 DIAGNOSIS — E7800 Pure hypercholesterolemia, unspecified: Secondary | ICD-10-CM | POA: Diagnosis not present

## 2016-07-07 DIAGNOSIS — Z720 Tobacco use: Secondary | ICD-10-CM | POA: Insufficient documentation

## 2016-07-07 DIAGNOSIS — E118 Type 2 diabetes mellitus with unspecified complications: Secondary | ICD-10-CM | POA: Diagnosis not present

## 2016-07-07 DIAGNOSIS — N401 Enlarged prostate with lower urinary tract symptoms: Secondary | ICD-10-CM | POA: Insufficient documentation

## 2016-07-07 DIAGNOSIS — I10 Essential (primary) hypertension: Secondary | ICD-10-CM | POA: Diagnosis not present

## 2016-07-07 DIAGNOSIS — E669 Obesity, unspecified: Secondary | ICD-10-CM | POA: Diagnosis present

## 2016-07-07 DIAGNOSIS — F329 Major depressive disorder, single episode, unspecified: Secondary | ICD-10-CM | POA: Insufficient documentation

## 2016-07-07 DIAGNOSIS — Z794 Long term (current) use of insulin: Secondary | ICD-10-CM | POA: Diagnosis not present

## 2016-07-07 DIAGNOSIS — I130 Hypertensive heart and chronic kidney disease with heart failure and stage 1 through stage 4 chronic kidney disease, or unspecified chronic kidney disease: Secondary | ICD-10-CM | POA: Insufficient documentation

## 2016-07-07 DIAGNOSIS — Z885 Allergy status to narcotic agent status: Secondary | ICD-10-CM | POA: Insufficient documentation

## 2016-07-07 DIAGNOSIS — E1169 Type 2 diabetes mellitus with other specified complication: Secondary | ICD-10-CM | POA: Diagnosis not present

## 2016-07-07 DIAGNOSIS — N400 Enlarged prostate without lower urinary tract symptoms: Secondary | ICD-10-CM | POA: Diagnosis present

## 2016-07-07 DIAGNOSIS — E781 Pure hyperglyceridemia: Secondary | ICD-10-CM | POA: Diagnosis not present

## 2016-07-07 DIAGNOSIS — Z8551 Personal history of malignant neoplasm of bladder: Secondary | ICD-10-CM | POA: Insufficient documentation

## 2016-07-07 DIAGNOSIS — Z8673 Personal history of transient ischemic attack (TIA), and cerebral infarction without residual deficits: Secondary | ICD-10-CM | POA: Insufficient documentation

## 2016-07-07 DIAGNOSIS — R269 Unspecified abnormalities of gait and mobility: Secondary | ICD-10-CM

## 2016-07-07 DIAGNOSIS — I5022 Chronic systolic (congestive) heart failure: Secondary | ICD-10-CM | POA: Diagnosis not present

## 2016-07-07 DIAGNOSIS — G4730 Sleep apnea, unspecified: Secondary | ICD-10-CM | POA: Insufficient documentation

## 2016-07-07 DIAGNOSIS — M1990 Unspecified osteoarthritis, unspecified site: Secondary | ICD-10-CM | POA: Diagnosis not present

## 2016-07-07 DIAGNOSIS — N183 Chronic kidney disease, stage 3 unspecified: Secondary | ICD-10-CM | POA: Diagnosis present

## 2016-07-07 DIAGNOSIS — I4892 Unspecified atrial flutter: Secondary | ICD-10-CM | POA: Diagnosis not present

## 2016-07-07 DIAGNOSIS — R079 Chest pain, unspecified: Secondary | ICD-10-CM | POA: Diagnosis not present

## 2016-07-07 DIAGNOSIS — Z6834 Body mass index (BMI) 34.0-34.9, adult: Secondary | ICD-10-CM | POA: Insufficient documentation

## 2016-07-07 DIAGNOSIS — N184 Chronic kidney disease, stage 4 (severe): Secondary | ICD-10-CM

## 2016-07-07 DIAGNOSIS — F0150 Vascular dementia without behavioral disturbance: Secondary | ICD-10-CM | POA: Insufficient documentation

## 2016-07-07 DIAGNOSIS — E1122 Type 2 diabetes mellitus with diabetic chronic kidney disease: Secondary | ICD-10-CM | POA: Diagnosis not present

## 2016-07-07 DIAGNOSIS — R296 Repeated falls: Secondary | ICD-10-CM | POA: Insufficient documentation

## 2016-07-07 DIAGNOSIS — R2681 Unsteadiness on feet: Secondary | ICD-10-CM | POA: Diagnosis not present

## 2016-07-07 DIAGNOSIS — Z66 Do not resuscitate: Secondary | ICD-10-CM | POA: Insufficient documentation

## 2016-07-07 DIAGNOSIS — K219 Gastro-esophageal reflux disease without esophagitis: Secondary | ICD-10-CM | POA: Diagnosis not present

## 2016-07-07 DIAGNOSIS — J42 Unspecified chronic bronchitis: Secondary | ICD-10-CM | POA: Insufficient documentation

## 2016-07-07 DIAGNOSIS — I480 Paroxysmal atrial fibrillation: Secondary | ICD-10-CM | POA: Diagnosis not present

## 2016-07-07 DIAGNOSIS — R0789 Other chest pain: Secondary | ICD-10-CM

## 2016-07-07 DIAGNOSIS — K5190 Ulcerative colitis, unspecified, without complications: Secondary | ICD-10-CM | POA: Diagnosis not present

## 2016-07-07 DIAGNOSIS — E119 Type 2 diabetes mellitus without complications: Secondary | ICD-10-CM

## 2016-07-07 DIAGNOSIS — M109 Gout, unspecified: Secondary | ICD-10-CM | POA: Diagnosis not present

## 2016-07-07 DIAGNOSIS — C679 Malignant neoplasm of bladder, unspecified: Secondary | ICD-10-CM | POA: Insufficient documentation

## 2016-07-07 DIAGNOSIS — I493 Ventricular premature depolarization: Secondary | ICD-10-CM | POA: Insufficient documentation

## 2016-07-07 DIAGNOSIS — J45909 Unspecified asthma, uncomplicated: Secondary | ICD-10-CM | POA: Diagnosis not present

## 2016-07-07 DIAGNOSIS — Z955 Presence of coronary angioplasty implant and graft: Secondary | ICD-10-CM | POA: Insufficient documentation

## 2016-07-07 DIAGNOSIS — D631 Anemia in chronic kidney disease: Secondary | ICD-10-CM | POA: Diagnosis not present

## 2016-07-07 DIAGNOSIS — N39498 Other specified urinary incontinence: Secondary | ICD-10-CM | POA: Insufficient documentation

## 2016-07-07 DIAGNOSIS — I509 Heart failure, unspecified: Secondary | ICD-10-CM

## 2016-07-07 DIAGNOSIS — E871 Hypo-osmolality and hyponatremia: Secondary | ICD-10-CM | POA: Insufficient documentation

## 2016-07-07 DIAGNOSIS — I2510 Atherosclerotic heart disease of native coronary artery without angina pectoris: Secondary | ICD-10-CM | POA: Insufficient documentation

## 2016-07-07 DIAGNOSIS — F419 Anxiety disorder, unspecified: Secondary | ICD-10-CM | POA: Diagnosis not present

## 2016-07-07 DIAGNOSIS — I5032 Chronic diastolic (congestive) heart failure: Secondary | ICD-10-CM | POA: Diagnosis not present

## 2016-07-07 DIAGNOSIS — E66811 Obesity, class 1: Secondary | ICD-10-CM | POA: Diagnosis present

## 2016-07-07 DIAGNOSIS — Z7982 Long term (current) use of aspirin: Secondary | ICD-10-CM | POA: Insufficient documentation

## 2016-07-07 DIAGNOSIS — Z7951 Long term (current) use of inhaled steroids: Secondary | ICD-10-CM | POA: Insufficient documentation

## 2016-07-07 HISTORY — DX: Alzheimer's disease, unspecified: G30.9

## 2016-07-07 HISTORY — DX: Dementia in other diseases classified elsewhere, unspecified severity, without behavioral disturbance, psychotic disturbance, mood disturbance, and anxiety: F02.80

## 2016-07-07 HISTORY — DX: Anxiety disorder, unspecified: F41.9

## 2016-07-07 HISTORY — DX: Unspecified osteoarthritis, unspecified site: M19.90

## 2016-07-07 HISTORY — DX: Personal history of other diseases of the digestive system: Z87.19

## 2016-07-07 HISTORY — DX: Ileostomy status: Z93.2

## 2016-07-07 HISTORY — DX: Unspecified asthma, uncomplicated: J45.909

## 2016-07-07 HISTORY — DX: Personal history of other medical treatment: Z92.89

## 2016-07-07 HISTORY — DX: Unspecified intestinal obstruction, unspecified as to partial versus complete obstruction: K56.609

## 2016-07-07 HISTORY — DX: Sleep apnea, unspecified: G47.30

## 2016-07-07 HISTORY — DX: Type 2 diabetes mellitus without complications: E11.9

## 2016-07-07 LAB — TROPONIN I
TROPONIN I: 0.05 ng/mL — AB (ref <0.03)
TROPONIN I: 0.07 ng/mL — AB (ref <0.03)
Troponin I: 0.04 ng/mL (ref <0.03)

## 2016-07-07 LAB — CBC WITH DIFFERENTIAL/PLATELET
BASOS ABS: 0 10*3/uL (ref 0.0–0.1)
BASOS PCT: 0 %
EOS ABS: 0.4 10*3/uL (ref 0.0–0.7)
Eosinophils Relative: 5 %
HEMATOCRIT: 43.3 % (ref 39.0–52.0)
HEMOGLOBIN: 15 g/dL (ref 13.0–17.0)
Lymphocytes Relative: 27 %
Lymphs Abs: 2.3 10*3/uL (ref 0.7–4.0)
MCH: 30.9 pg (ref 26.0–34.0)
MCHC: 34.6 g/dL (ref 30.0–36.0)
MCV: 89.1 fL (ref 78.0–100.0)
MONOS PCT: 6 %
Monocytes Absolute: 0.5 10*3/uL (ref 0.1–1.0)
NEUTROS ABS: 5.1 10*3/uL (ref 1.7–7.7)
NEUTROS PCT: 62 %
Platelets: 178 10*3/uL (ref 150–400)
RBC: 4.86 MIL/uL (ref 4.22–5.81)
RDW: 13.8 % (ref 11.5–15.5)
WBC: 8.3 10*3/uL (ref 4.0–10.5)

## 2016-07-07 LAB — BASIC METABOLIC PANEL
Anion gap: 8 (ref 5–15)
BUN: 36 mg/dL — ABNORMAL HIGH (ref 6–20)
CHLORIDE: 105 mmol/L (ref 101–111)
CO2: 22 mmol/L (ref 22–32)
CREATININE: 2 mg/dL — AB (ref 0.61–1.24)
Calcium: 9.8 mg/dL (ref 8.9–10.3)
GFR calc non Af Amer: 30 mL/min — ABNORMAL LOW (ref >60)
GFR, EST AFRICAN AMERICAN: 35 mL/min — AB (ref >60)
Glucose, Bld: 211 mg/dL — ABNORMAL HIGH (ref 65–99)
POTASSIUM: 5.6 mmol/L — AB (ref 3.5–5.1)
Sodium: 135 mmol/L (ref 135–145)

## 2016-07-07 LAB — LIPID PANEL
CHOL/HDL RATIO: 4.3 ratio
CHOLESTEROL: 132 mg/dL (ref 0–200)
HDL: 31 mg/dL — ABNORMAL LOW (ref >40)
LDL Cholesterol: 28 mg/dL (ref 0–99)
Triglycerides: 363 mg/dL — ABNORMAL HIGH (ref <150)
VLDL: 73 mg/dL — ABNORMAL HIGH (ref 0–40)

## 2016-07-07 LAB — GLUCOSE, CAPILLARY
GLUCOSE-CAPILLARY: 223 mg/dL — AB (ref 65–99)
Glucose-Capillary: 188 mg/dL — ABNORMAL HIGH (ref 65–99)

## 2016-07-07 LAB — I-STAT TROPONIN, ED: TROPONIN I, POC: 0.02 ng/mL (ref 0.00–0.08)

## 2016-07-07 MED ORDER — INSULIN ASPART 100 UNIT/ML ~~LOC~~ SOLN
0.0000 [IU] | Freq: Every day | SUBCUTANEOUS | Status: DC
  Administered 2016-07-09: 2 [IU] via SUBCUTANEOUS

## 2016-07-07 MED ORDER — ALBUTEROL SULFATE (2.5 MG/3ML) 0.083% IN NEBU: 3.0000 mL | INHALATION_SOLUTION | Freq: Four times a day (QID) | RESPIRATORY_TRACT | Status: DC | PRN

## 2016-07-07 MED ORDER — ASPIRIN EC 81 MG PO TBEC
81.0000 mg | DELAYED_RELEASE_TABLET | Freq: Every day | ORAL | Status: DC
  Administered 2016-07-08 – 2016-07-10 (×3): 81 mg via ORAL
  Filled 2016-07-07 (×3): qty 1

## 2016-07-07 MED ORDER — GI COCKTAIL ~~LOC~~
30.0000 mL | Freq: Once | ORAL | Status: AC
  Administered 2016-07-07: 30 mL via ORAL
  Filled 2016-07-07: qty 30

## 2016-07-07 MED ORDER — DONEPEZIL HCL 10 MG PO TABS
10.0000 mg | ORAL_TABLET | Freq: Every day | ORAL | Status: DC
  Administered 2016-07-07 – 2016-07-09 (×3): 10 mg via ORAL
  Filled 2016-07-07 (×3): qty 1

## 2016-07-07 MED ORDER — METOPROLOL TARTRATE 25 MG PO TABS
25.0000 mg | ORAL_TABLET | Freq: Two times a day (BID) | ORAL | Status: DC
  Administered 2016-07-07 – 2016-07-10 (×6): 25 mg via ORAL
  Filled 2016-07-07 (×6): qty 1

## 2016-07-07 MED ORDER — SODIUM CHLORIDE 0.9 % IV SOLN: INTRAVENOUS | Status: DC

## 2016-07-07 MED ORDER — GI COCKTAIL ~~LOC~~: 30.0000 mL | Freq: Four times a day (QID) | ORAL | Status: DC | PRN

## 2016-07-07 MED ORDER — METOPROLOL TARTRATE 25 MG PO TABS: 25.0000 mg | ORAL_TABLET | Freq: Two times a day (BID) | ORAL | Status: DC

## 2016-07-07 MED ORDER — MORPHINE SULFATE (PF) 4 MG/ML IV SOLN: 2.0000 mg | INTRAVENOUS | Status: DC | PRN

## 2016-07-07 MED ORDER — ROSUVASTATIN CALCIUM 10 MG PO TABS
10.0000 mg | ORAL_TABLET | Freq: Every day | ORAL | Status: DC
  Administered 2016-07-07 – 2016-07-09 (×3): 10 mg via ORAL
  Filled 2016-07-07 (×3): qty 1

## 2016-07-07 MED ORDER — PAROXETINE HCL 20 MG PO TABS
20.0000 mg | ORAL_TABLET | Freq: Every day | ORAL | Status: DC
  Administered 2016-07-07 – 2016-07-10 (×4): 20 mg via ORAL
  Filled 2016-07-07 (×4): qty 1

## 2016-07-07 MED ORDER — DILTIAZEM HCL ER COATED BEADS 240 MG PO CP24
240.0000 mg | ORAL_CAPSULE | Freq: Every day | ORAL | Status: DC
  Administered 2016-07-07 – 2016-07-10 (×4): 240 mg via ORAL
  Filled 2016-07-07 (×4): qty 1

## 2016-07-07 MED ORDER — PANTOPRAZOLE SODIUM 40 MG PO TBEC
40.0000 mg | DELAYED_RELEASE_TABLET | Freq: Every day | ORAL | Status: DC
  Administered 2016-07-07 – 2016-07-10 (×4): 40 mg via ORAL
  Filled 2016-07-07 (×4): qty 1

## 2016-07-07 MED ORDER — ACETAMINOPHEN 325 MG PO TABS: 650.0000 mg | ORAL_TABLET | ORAL | Status: DC | PRN

## 2016-07-07 MED ORDER — ENOXAPARIN SODIUM 40 MG/0.4ML ~~LOC~~ SOLN
40.0000 mg | SUBCUTANEOUS | Status: DC
  Administered 2016-07-07 – 2016-07-09 (×3): 40 mg via SUBCUTANEOUS
  Filled 2016-07-07 (×3): qty 0.4

## 2016-07-07 MED ORDER — ASPIRIN 81 MG PO CHEW
81.0000 mg | CHEWABLE_TABLET | Freq: Once | ORAL | Status: AC
  Administered 2016-07-07: 81 mg via ORAL
  Filled 2016-07-07: qty 1

## 2016-07-07 MED ORDER — FINASTERIDE 5 MG PO TABS
5.0000 mg | ORAL_TABLET | Freq: Every day | ORAL | Status: DC
  Administered 2016-07-07 – 2016-07-10 (×4): 5 mg via ORAL
  Filled 2016-07-07 (×4): qty 1

## 2016-07-07 MED ORDER — ONDANSETRON HCL 4 MG/2ML IJ SOLN: 4.0000 mg | Freq: Four times a day (QID) | INTRAMUSCULAR | Status: DC | PRN

## 2016-07-07 MED ORDER — INSULIN ASPART 100 UNIT/ML ~~LOC~~ SOLN
0.0000 [IU] | Freq: Three times a day (TID) | SUBCUTANEOUS | Status: DC
  Administered 2016-07-07: 3 [IU] via SUBCUTANEOUS
  Administered 2016-07-08: 8 [IU] via SUBCUTANEOUS
  Administered 2016-07-08: 5 [IU] via SUBCUTANEOUS
  Administered 2016-07-09: 3 [IU] via SUBCUTANEOUS
  Administered 2016-07-09: 5 [IU] via SUBCUTANEOUS
  Administered 2016-07-09: 8 [IU] via SUBCUTANEOUS
  Administered 2016-07-10: 5 [IU] via SUBCUTANEOUS

## 2016-07-07 NOTE — Progress Notes (Signed)
CRITICAL VALUE STICKER  CRITICAL VALUE: Troponin 0.04  RECEIVER (on-site recipient of call): Elberta Spaniel RN  [place] NOTIFIED: 07/07/16 1827  MESSENGER (representative from lab): Aura Camps  MD NOTIFIED: Dr. Linna Darner paged  [place]: [place]  RESPONSE: Acknowledged 1848

## 2016-07-07 NOTE — ED Provider Notes (Signed)
[facility] DEPT Provider Note   CSN: [id number] Arrival date & time: 07/07/16  1341     History   Chief Complaint Chief Complaint  Patient presents with  . Chest Pain    HPI [name] is a 76 y.o. male.  HPI  Is a 76 year old male with past medical history significant for paroxysmal A. fib, on aspirin, and stent placed in 2002.  Patient has severe Alzheimer's and had recent hospitalization 1 year ago. At that time he is put on hospice. He was seen by cardiology for small bowel obstruction surgery. He had a normal echo and was told to follow-up in order to reevaluate for anticoagulation given paroxysmal A. fib. He was never able to follow-up.  He is presenting today to the hospital because he's had severe left-sided chest pain.  Past Medical History:  Diagnosis Date  . AAA (abdominal aortic aneurysm) ([place]) 1992   S/p repair and grafting  . Arthritis    "left knee"  . Benign prostatic hypertrophy    S/p TURP   . Bladder cancer (HCC)    Transitional Cell  . CAD (coronary artery disease)    a. patient's wife reports no prior stenting  . Chronic bronchitis ([place])    "used to get it quite often; found out medicine was causing it"  . Chronic systolic CHF (congestive heart failure) (HCC)    EF 40-45% by echo 06/2011  . Colitis, ulcerative ([place])    NOS  . Colon cancer ([place])   . Depression   . Diabetes mellitus   . Frequent falls   . GERD (gastroesophageal reflux disease)   . Gout   . H/O ETOH abuse 06/06/2011  . History of alcohol abuse   . Hypercholesteremia    IIA  . Normocytic anemia    Chronic  . Obesity   . PAF (paroxysmal atrial fibrillation) (HCC)    a. during hospitalization 06/2011 for sepsis; b. not on long term, full-dose anticoagulation; c. CHADS2VASc 8 (CHF, HTN, age x 2, DM, stroke x 2, vascular dz)  . Personal hx-rectal/anal malignancy   . Pleurisy   . Stones in the urinary tract   . Stroke [facility])    76 yrs old  . Uncontrolled secondary  diabetes mellitus with stage 3 CKD (GFR 30-59) ([place]) 06/05/2011  . Unspecified essential hypertension   . Urinary incontinence   . Vascular dementia     Patient Active Problem List   Diagnosis Date Noted  . PAF (paroxysmal atrial fibrillation) ([place]) 09/08/2015  . Vascular dementia   . Frequent falls   . Small bowel obstruction   . GERD (gastroesophageal reflux disease) 09/04/2015  . Diabetes ([place]) 06/19/2015  . Abnormality of gait 06/26/2014  . Memory disorder 04/23/2013  . Acute renal failure ([place]) 10/27/2011  . Staphylococcus aureus bacteremia 06/14/2011  . Infection of prosthetic left knee joint ([place]) 06/12/2011  . CKD (chronic kidney disease) stage 3, GFR 30-59 ml/min 06/12/2011  . Morbid obesity ([place]) 06/12/2011  . Hyponatremia 06/12/2011  . H/O ETOH abuse 06/06/2011  . Hyperkalemia 06/05/2011  . Normocytic anemia 06/05/2011  . SYNCOPE AND COLLAPSE 07/18/2008  . HYPERCHOLESTEROLEMIA  IIA 07/16/2008  . GOUT 07/16/2008  . ANEMIA, NORMOCYTIC, CHRONIC 07/16/2008  . DEPRESSION 07/16/2008  . Essential hypertension 07/16/2008  . COLITIS, ULCERATIVE NOS 05/25/2006  . BPH (benign prostatic hyperplasia) 05/25/2006  . URINARY INCONTINENCE 05/25/2006  . HX, PERSONAL, MALIGNANCY, RECTUM/ANUS 05/25/2006    Past Surgical History:  Procedure Laterality Date  . ABDOMINAL AORTIC  ANEURYSM REPAIR  1992   "had 3 blockages; 3 aneurysms repaired at the same time"  . ANGIOPLASTY    . APPENDECTOMY    . BLADDER TUMOR EXCISION  2002  . CATARACT EXTRACTION W/ INTRAOCULAR LENS  IMPLANT, BILATERAL  ~ 2010  . COLONOSCOPY W/ POLYPECTOMY    . compound fracture ankle  08/17/2003  . CORONARY ANGIOPLASTY     1992  . EYE SURGERY    . I&D KNEE WITH POLY EXCHANGE  06/12/2011   Procedure: IRRIGATION AND DEBRIDEMENT KNEE WITH POLY EXCHANGE;  Surgeon: Yvette Rack., MD;  Location: [facility];  Service: Orthopedics;  Laterality: Left;  . JOINT REPLACEMENT     left knee  . TOTAL KNEE ARTHROPLASTY  1990's    left  . TOTAL KNEE REVISION  10/26/11   left  . TOTAL KNEE REVISION  10/26/2011   Procedure: TOTAL KNEE REVISION;  Surgeon: Garald Balding, MD;  Location: [facility];  Service: Orthopedics;  Laterality: Left;  left total knee revision  . TRANSURETHRAL RESECTION OF PROSTATE         Home Medications    Prior to Admission medications   Medication Sig Start Date End Date Taking? Authorizing Provider  albuterol (PROVENTIL HFA;VENTOLIN HFA) 108 (90 BASE) MCG/ACT inhaler Inhale 2 puffs into the lungs every 6 (six) hours as needed for wheezing or shortness of breath. Reported on 06/18/2015    Historical Provider, MD  aspirin EC 81 MG tablet Take 81 mg by mouth daily.    Historical Provider, MD  diltiazem (CARDIZEM CD) 240 MG 24 hr capsule Take 240 mg by mouth daily. 04/08/13   Historical Provider, MD  donepezil (ARICEPT) 10 MG tablet Take 1 tablet (10 mg total) by mouth at bedtime. 01/27/16   Kathrynn Ducking, MD  finasteride (PROSCAR) 5 MG tablet  10/15/15   Historical Provider, MD  fluocinonide cream (LIDEX) 7.86 % Apply 1 application topically daily as needed.  08/14/15   Historical Provider, MD  fluticasone (FLONASE) 50 MCG/ACT nasal spray Place 1 spray into both nostrils 2 (two) times daily as needed for allergies.  08/22/15   Historical Provider, MD  insulin NPH Human (HUMULIN N) 100 UNIT/ML injection Inject 0.6 mLs (60 Units total) into the skin every morning. 04/29/16   Renato Shin, MD  memantine (NAMENDA) 5 MG tablet Take 1 tablet in the morning and 2 tablets at bedtime for 1 week, increase to 2 tablets BID 04/07/16   Ward Givens, NP  metoprolol tartrate (LOPRESSOR) 25 MG tablet Take 25 mg by mouth 2 (two) times daily. 06/24/11   Radene Gunning, NP  Multiple Vitamins-Minerals (CENTRUM SILVER) tablet Take 1 tablet by mouth daily.    Historical Provider, MD  Omega-3 Fatty Acids (FISH OIL) 1200 MG CAPS Take 1,200 mg by mouth 2 (two) times daily.    Historical Provider, MD  pantoprazole (PROTONIX) 40 MG  tablet Take 40 mg by mouth daily at 12 noon. 06/24/11   Radene Gunning, NP  PARoxetine (PAXIL) 40 MG tablet Take 20 mg by mouth daily.  07/14/15   Historical Provider, MD  rosuvastatin (CRESTOR) 20 MG tablet Take 10 mg by mouth at bedtime.     Historical Provider, MD  UNKNOWN TO PATIENT Ferrous sulfate 65mg  po daily    Historical Provider, MD    Family History Family History  Problem Relation Age of Onset  . Dementia Mother   . COPD Father   . Dementia Sister   . Cancer  Brother   . Cancer Brother   . Dementia Brother   . Anesthesia problems Neg Hx   . Diabetes Neg Hx     Social History Social History  Substance Use Topics  . Smoking status: Former Smoker    Packs/day: 2.00    Years: 12.00    Types: Cigarettes    Quit date: 06/04/1970  . Smokeless tobacco: Current User    Types: Chew  . Alcohol use No     Comment: Occasional     Allergies   Fentanyl; Hydromorphone hcl; Lorazepam; Oxycodone-acetaminophen; Lisinopril; and Vicodin [hydrocodone-acetaminophen]   Review of Systems Review of Systems  Constitutional: Negative for fever.  Respiratory: Positive for chest tightness and shortness of breath.   Cardiovascular: Positive for chest pain. Negative for palpitations and leg swelling.  Gastrointestinal: Negative for abdominal pain.  All other systems reviewed and are negative.    Physical Exam Updated Vital Signs BP (!) 133/95   Pulse (!) 104   Temp 98.1 F (36.7 C) (Oral)   Resp 17   SpO2 99%   Physical Exam  Constitutional: He is oriented to person, place, and time. He appears well-nourished.  HENT:  Head: Normocephalic.  Mouth/Throat: Oropharynx is clear and moist.  Eyes: Conjunctivae are normal.  Neck: No tracheal deviation present.  Cardiovascular: Normal rate.   Pulmonary/Chest: Effort normal and breath sounds normal. No stridor. No respiratory distress.  Scarring to left anterior chest wall.  Abdominal: Soft. Bowel sounds are normal. There is no  tenderness. There is no guarding.  Musculoskeletal: Normal range of motion. He exhibits edema.  Neurological: He is oriented to person, place, and time. No cranial nerve deficit.  Skin: Skin is warm and dry. No rash noted. He is not diaphoretic.  Psychiatric: He has a normal mood and affect. His behavior is normal.  Nursing note and vitals reviewed.    ED Treatments / Results  Labs (all labs ordered are listed, but only abnormal results are displayed) Labs Reviewed  URINALYSIS, [place] PANEL  CBC WITH DIFFERENTIAL/PLATELET  I-STAT [place], ED    EKG  EKG Interpretation  Date/Time:  Wednesday July 07 2016 13:51:59 EDT Ventricular Rate:  116 PR Interval:    QRS Duration: 96 QT Interval:  303 QTC Calculation: 421 R Axis:   4 Text Interpretation:  Atrial flutter with predominant 2:1 AV block Low voltage, extremity leads Nonspecific repol abnormality, diffuse leads No significant change since last tracing Confirmed by Gerald Leitz ([id number]) on 07/07/2016 1:58:57 PM Also confirmed by Gerald Leitz ([id number]), editor Lorenda Cahill CT, Leda Gauze [phone])  on 07/07/2016 2:05:14 PM       Radiology Dg Chest 2 View  Result Date: 07/07/2016 CLINICAL DATA:  Chest discomfort. EXAM: CHEST  2 VIEW COMPARISON:  09/04/2015. FINDINGS: Mediastinum and hilar structures are normal. Lungs are clear of acute infiltrates. No pleural effusion or pneumothorax. Degenerative changes thoracic spine. IMPRESSION: No acute cardiopulmonary disease. Electronically Signed   By: Marcello Moores  Register   On: 07/07/2016 14:40    Procedures Procedures (including critical care time)  Medications Ordered in ED Medications  aspirin chewable tablet 81 mg (not administered)     Initial Impression / Assessment and Plan / ED Course  I have reviewed the triage vital signs and the nursing notes.  Pertinent labs & imaging results that were available during my care of the patient were  reviewed by me and considered in my medical decision making (see chart for details).  Patient is 76 year old male with history of paroxysmal A. fib, on aspirin, not followed by cardiology, with history of a stent 2002. He is presenting today with left chest pain radiating to his left jaw. He has since resolved. Patient has an elevated heart score given his risk factors of hypertension hyperlipidemia, previous stents. EKG shows A. fib/flutter. Hhe took 2 baby aspirin prior to arrival. Initial troponin negative. We'll admit for serial troponins.   Final Clinical Impressions(s) / ED Diagnoses   Final diagnoses:  None    New Prescriptions New Prescriptions   No medications on file     Yashira Offenberger Julio Alm, MD 07/07/16 1520

## 2016-07-07 NOTE — ED Notes (Signed)
Patient transported to X-ray 

## 2016-07-07 NOTE — ED Triage Notes (Signed)
Pt from home via [facility] with c/o left chest discomfort with no radiation starting today with complete relief of pain shortly after EMS arrival.  Pt denies SOB, diaphoresis, or any other complaints.  NAD, A&O x3, disoriented to time at baseline.

## 2016-07-07 NOTE — H&P (Signed)
History and Physical    [name] JIR:[id number] DOB: [birth year] DOA: 07/07/2016  PCP: Philmore Pali, NP Patient coming from: home  Chief Complaint: chest pain  HPI: [name] is a very pleasant quite demented 76 y.o. male with medical history significant for A. fib, hypertension, chronic kidney disease stage III, stroke, diabetes, GERD, chronic systolic heart failure, CAD, bladder cancer status post iliostomy presented to the emergency department with the chief complaint of chest pain. Patient being admitted for chest pain rule out  Information is obtained from the chart and the wife who is at the bedside and patient noting that information from patient is unreliable do to dementia. Wife states he was in his usual state of health until he awakened this morning complained of left anterior chest pain. She said he lay in bed with some intermittent moaning. He described the pain as constant. She states she "monitored" him for the next several minutes. She denies noting any "clamminess" shortness of breath swelling. She states after few minutes she gave him 281 mg of aspirin. He continued with intermittent moaning and complaints of left anterior nonradiating chest pain. She called EMS when the a did not subside. She denies any complaints of headache dizziness syncope or near-syncope. She denies any recent fever chills nausea vomiting diarrhea constipation. Patient does have an ileostomy since 2007 secondary hx ulcerated colitis s/p total colectomy that she reports has had a low output today. He reports compliance with his medications as she is "in charge of all that". Patient reports his chest pain is gone.   ED Course: In the emergency department he's afebrile hemodynamically stable he is not hypoxic. Initial troponin negative.  Review of Systems: As per HPI otherwise 10 point review of systems negative.   Ambulatory Status: Wife reports patient ambulates with a cane has had no recent falls.  His minimal assist with ADLs but does have Alzheimers so needs supervision.  Past Medical History:  Diagnosis Date  . AAA (abdominal aortic aneurysm) ([place]) 1992   S/p repair and grafting  . Arthritis    "left knee"  . Benign prostatic hypertrophy    S/p TURP   . Bladder cancer (HCC)    Transitional Cell  . CAD (coronary artery disease)    a. patient's wife reports no prior stenting  . Chronic bronchitis ([place])    "used to get it quite often; found out medicine was causing it"  . Chronic systolic CHF (congestive heart failure) (HCC)    EF 40-45% by echo 06/2011  . Colitis, ulcerative ([place])    NOS  . Colon cancer ([place])   . Depression   . Diabetes mellitus   . Frequent falls   . GERD (gastroesophageal reflux disease)   . Gout   . H/O ETOH abuse 06/06/2011  . History of alcohol abuse   . Hypercholesteremia    IIA  . Normocytic anemia    Chronic  . Obesity   . PAF (paroxysmal atrial fibrillation) (HCC)    a. during hospitalization 06/2011 for sepsis; b. not on long term, full-dose anticoagulation; c. CHADS2VASc 8 (CHF, HTN, age x 2, DM, stroke x 2, vascular dz)  . Personal hx-rectal/anal malignancy   . Pleurisy   . Stones in the urinary tract   . Stroke [facility])    76 yrs old  . Uncontrolled secondary diabetes mellitus with stage 3 CKD (GFR 30-59) ([place]) 06/05/2011  . Unspecified essential hypertension   . Urinary incontinence   . Vascular  dementia     Past Surgical History:  Procedure Laterality Date  . ABDOMINAL AORTIC ANEURYSM REPAIR  1992   "had 3 blockages; 3 aneurysms repaired at the same time"  . ANGIOPLASTY    . APPENDECTOMY    . BLADDER TUMOR EXCISION  2002  . CATARACT EXTRACTION W/ INTRAOCULAR LENS  IMPLANT, BILATERAL  ~ 2010  . COLONOSCOPY W/ POLYPECTOMY    . compound fracture ankle  08/17/2003  . CORONARY ANGIOPLASTY     1992  . EYE SURGERY    . I&D KNEE WITH POLY EXCHANGE  06/12/2011   Procedure: IRRIGATION AND DEBRIDEMENT KNEE WITH POLY EXCHANGE;  Surgeon: Yvette Rack., MD;  Location: [facility];  Service: Orthopedics;  Laterality: Left;  . JOINT REPLACEMENT     left knee  . TOTAL KNEE ARTHROPLASTY  1990's   left  . TOTAL KNEE REVISION  10/26/11   left  . TOTAL KNEE REVISION  10/26/2011   Procedure: TOTAL KNEE REVISION;  Surgeon: Garald Balding, MD;  Location: [place];  Service: Orthopedics;  Laterality: Left;  left total knee revision  . TRANSURETHRAL RESECTION OF PROSTATE      Social History   Social History  . Marital status: Married    Spouse name: N/A  . Number of children: 3  . Years of education: hs   Occupational History  . Retired    Social History Main Topics  . Smoking status: Former Smoker    Packs/day: 2.00    Years: 12.00    Types: Cigarettes    Quit date: 06/04/1970  . Smokeless tobacco: Current User    Types: Chew  . Alcohol use No     Comment: Occasional  . Drug use: No  . Sexual activity: Not Currently    Birth control/ protection: None   Other Topics Concern  . Not on file   Social History Narrative   Married. Patient lives at home with his wife.   Patient is right handed.   Patient drinks 3 cups of caffeine per day.    Allergies  Allergen Reactions  . Fentanyl Other (See Comments)    Delirium  . Hydromorphone Hcl Other (See Comments)    Delirium  . Lorazepam Other (See Comments)    Delirium  . Oxycodone-Acetaminophen Other (See Comments)    Delirium  . Lisinopril   . Vicodin [Hydrocodone-Acetaminophen]     Family History  Problem Relation Age of Onset  . Dementia Mother   . COPD Father   . Dementia Sister   . Cancer Brother   . Cancer Brother   . Dementia Brother   . Anesthesia problems Neg Hx   . Diabetes Neg Hx     Prior to Admission medications   Medication Sig Start Date End Date Taking? Authorizing Provider  albuterol (PROVENTIL HFA;VENTOLIN HFA) 108 (90 BASE) MCG/ACT inhaler Inhale 2 puffs into the lungs every 6 (six) hours as needed for wheezing or shortness of breath.  Reported on 06/18/2015    Historical Provider, MD  aspirin EC 81 MG tablet Take 81 mg by mouth daily.    Historical Provider, MD  diltiazem (CARDIZEM CD) 240 MG 24 hr capsule Take 240 mg by mouth daily. 04/08/13   Historical Provider, MD  donepezil (ARICEPT) 10 MG tablet Take 1 tablet (10 mg total) by mouth at bedtime. 01/27/16   Kathrynn Ducking, MD  finasteride (PROSCAR) 5 MG tablet  10/15/15   Historical Provider, MD  fluocinonide cream ([place])  3.29 % Apply 1 application topically daily as needed.  08/14/15   Historical Provider, MD  fluticasone (FLONASE) 50 MCG/ACT nasal spray Place 1 spray into both nostrils 2 (two) times daily as needed for allergies.  08/22/15   Historical Provider, MD  insulin NPH Human (HUMULIN N) 100 UNIT/ML injection Inject 0.6 mLs (60 Units total) into the skin every morning. 04/29/16   Renato Shin, MD  memantine (NAMENDA) 5 MG tablet Take 1 tablet in the morning and 2 tablets at bedtime for 1 week, increase to 2 tablets BID 04/07/16   Ward Givens, NP  metoprolol tartrate (LOPRESSOR) 25 MG tablet Take 25 mg by mouth 2 (two) times daily. 06/24/11   Radene Gunning, NP  Multiple Vitamins-Minerals (CENTRUM SILVER) tablet Take 1 tablet by mouth daily.    Historical Provider, MD  Omega-3 Fatty Acids (FISH OIL) 1200 MG CAPS Take 1,200 mg by mouth 2 (two) times daily.    Historical Provider, MD  pantoprazole (PROTONIX) 40 MG tablet Take 40 mg by mouth daily at 12 noon. 06/24/11   Radene Gunning, NP  PARoxetine (PAXIL) 40 MG tablet Take 20 mg by mouth daily.  07/14/15   Historical Provider, MD  rosuvastatin (CRESTOR) 20 MG tablet Take 10 mg by mouth at bedtime.     Historical Provider, MD  UNKNOWN TO PATIENT Ferrous sulfate 65mg  po daily    Historical Provider, MD    Physical Exam: Vitals:   07/07/16 1400 07/07/16 1540 07/07/16 1600 07/07/16 1652  BP: (!) 133/95 104/88 (!) 111/93 (!) 130/98  Pulse: (!) 104 (!) 111 (!) 42 (!) 129  Resp: 17 18 14 14   Temp:    98.1 F (36.7 C)    TempSrc:    Oral  SpO2: 99% 100% 100% 100%  Weight:    108.4 kg (239 lb)  Height:    5\' 10"  (1.778 m)     General:  Appears calm and comfortable cooperative Eyes:  PERRL, EOMI, normal lids, iris ENT:  grossly normal hearing, lips & tongue, mucous membranes of his mouth are pink but quite dry Neck:  no LAD, masses or thyromegaly Cardiovascular:  Irregularly irregular, no m/r/g. No LE edema.  Respiratory:  CTA bilaterally, no w/r/r. Normal respiratory effort. Abdomen:  soft, ntnd, positive bowel sounds throughout. Ostomy bag in place. Mild excoriation around bag site Skin:  no rash or induration seen on limited exam Musculoskeletal:  grossly normal tone BUE/BLE, good ROM, no bony abnormality Psychiatric:  grossly normal mood and affect, speech fluent and appropriate, AOx1 Neurologic:  CN 2-12 grossly intact, moves all extremities in coordinated fashion, sensation intact. Oriented to self only. Pleasant cooperative attempts to follow simple commands.  Labs on Admission: I have personally reviewed following labs and imaging studies  CBC:  Recent Labs Lab 07/07/16 1405  WBC 8.3  NEUTROABS 5.1  HGB 15.0  HCT 43.3  MCV 89.1  PLT 924   Basic Metabolic Panel: No results for input(s): NA, K, CL, CO2, GLUCOSE, BUN, CREATININE, CALCIUM, MG, PHOS in the last 168 hours. GFR: CrCl cannot be calculated (Patient's most recent lab result is older than the maximum 21 days allowed.). Liver Function Tests: No results for input(s): AST, ALT, ALKPHOS, BILITOT, PROT, ALBUMIN in the last 168 hours. No results for input(s): LIPASE, AMYLASE in the last 168 hours. No results for input(s): AMMONIA in the last 168 hours. Coagulation Profile: No results for input(s): INR, PROTIME in the last 168 hours. Cardiac Enzymes: No results for input(s):  CKTOTAL, CKMB, CKMBINDEX, TROPONINI in the last 168 hours. BNP (last 3 results) No results for input(s): PROBNP in the last 8760 hours. HbA1C: No results  for input(s): HGBA1C in the last 72 hours. CBG: No results for input(s): GLUCAP in the last 168 hours. Lipid Profile: No results for input(s): CHOL, HDL, LDLCALC, TRIG, CHOLHDL, LDLDIRECT in the last 72 hours. Thyroid Function Tests: No results for input(s): TSH, T4TOTAL, FREET4, T3FREE, THYROIDAB in the last 72 hours. Anemia Panel: No results for input(s): VITAMINB12, FOLATE, FERRITIN, TIBC, IRON, RETICCTPCT in the last 72 hours. Urine analysis:    Component Value Date/Time   COLORURINE YELLOW (A) 09/30/2015 1542   APPEARANCEUR CLOUDY (A) 09/30/2015 1542   LABSPEC 1.010 09/30/2015 1542   PHURINE 5.0 09/30/2015 1542   GLUCOSEU NEGATIVE 09/30/2015 1542   HGBUR NEGATIVE 09/30/2015 1542   BILIRUBINUR NEGATIVE 09/30/2015 1542   KETONESUR NEGATIVE 09/30/2015 1542   PROTEINUR 30 (A) 09/30/2015 1542   UROBILINOGEN 0.2 10/20/2011 1026   NITRITE NEGATIVE 09/30/2015 1542   LEUKOCYTESUR 3+ (A) 09/30/2015 1542    Creatinine Clearance: CrCl cannot be calculated (Patient's most recent lab result is older than the maximum 21 days allowed.).  Sepsis Labs: @LABRCNTIP (procalcitonin:4,lacticidven:4) )No results found for this or any previous visit (from the past 240 hour(s)).   Radiological Exams on Admission: Dg Chest 2 View  Result Date: 07/07/2016 CLINICAL DATA:  Chest discomfort. EXAM: CHEST  2 VIEW COMPARISON:  09/04/2015. FINDINGS: Mediastinum and hilar structures are normal. Lungs are clear of acute infiltrates. No pleural effusion or pneumothorax. Degenerative changes thoracic spine. IMPRESSION: No acute cardiopulmonary disease. Electronically Signed   By: Marcello Moores  Register   On: 07/07/2016 14:40    EKG: Independently reviewed. Atrial flutter with predominant 2:1 AV block Low voltage, extremity leads Nonspecific repol abnormality, diffuse leads  Assessment/Plan Principal Problem:   Chest pain Active Problems:   Essential hypertension   BPH (benign prostatic hyperplasia)   CKD  (chronic kidney disease) stage 3, GFR 30-59 ml/min   Morbid obesity (HCC)   Abnormality of gait   Diabetes (HCC)   GERD (gastroesophageal reflux disease)   PAF (paroxysmal atrial fibrillation) ([place])   Vascular dementia   Chronic CHF ([place])   #1. Chest pain. Heart score 5. History of CAD, diabetes, chronic kidney disease, atrial fib, hypercholesterolemia obesity. Asked x-ray with no acute cardiopulmonary disease. Initial troponin negative. Initial EKG without acute changes. Pain resolved on admission. -Admit to telemetry -Cycle troponin -Serial EKGs -Continue aspirin -Continue statin -Continue metoprolol -Start to home with outpatient follow-up once he rules out. Wife reports no ischemia work up in years. He was evaluated by cardiology last year prior to surgery for repair of small bowel obstruction and at that point in time they had no further recommendations other than medical management  #2. A. fib. Rate controlled. Chads score at least an 8 with CHF, hypertension, diabetes, stroke 2, vascular disease. Home medications include low-dose aspirin, diltiazem, Toprol -Continue home meds with parameters -Not a long-term anticoagulation candidate due to unsteady gait with frequent falls -Monitor  #3. Diabetes. Serum glucose unknown at the time of admission still waiting on lab work. Home medications include Humalog insulin twice a day. -We'll hold this for now -Obtain a hemoglobin A1c -Sliding scale insulin for optimal control  #4. Chronic systolic heart failure. Currently compensated. Echo done last year with an EF of 50%. -Obtain daily weights -Monitor intake and output -Continue home meds  #5. Chronic kidney disease stage III. bmet pending at time  of admission. Appears clinically dry. -Gentle IV fluids -Monitor intake and output -Hold nephrotoxins -Recheck in the morning  #6. CAD. See #1. Reports no prior stenting. Pain resolved on admission. Evaluated by cardiology for clearance  of general surgery for repair of small bowel obstruction last year. Was instructed to follow-up but patient and family opted not to. -If he rules out discharged home outpatient follow-up -Continue aspirin and statin -And I metoprolol  #7. Hypertension. Fair control in the emergency department -Continue home meds  #8. Vascular dementia. Appears stable at baseline -Continue home meds  DVT prophylaxis: scd  Code Status: dnr  Family Communication: wife at bedside  Disposition Plan: hom  Consults called: none  Admission status: obs.     Radene Gunning MD [facility]  If 7PM-7AM, please contact night-coverage [email or web address] Password [email or web address]  07/07/2016, 4:59 PM

## 2016-07-07 NOTE — ED Notes (Signed)
Declined W/C at D/C and was escorted to lobby by RN. 

## 2016-07-08 DIAGNOSIS — I5022 Chronic systolic (congestive) heart failure: Secondary | ICD-10-CM | POA: Diagnosis not present

## 2016-07-08 DIAGNOSIS — E669 Obesity, unspecified: Secondary | ICD-10-CM | POA: Diagnosis not present

## 2016-07-08 DIAGNOSIS — N183 Chronic kidney disease, stage 3 (moderate): Secondary | ICD-10-CM

## 2016-07-08 DIAGNOSIS — I130 Hypertensive heart and chronic kidney disease with heart failure and stage 1 through stage 4 chronic kidney disease, or unspecified chronic kidney disease: Secondary | ICD-10-CM | POA: Diagnosis not present

## 2016-07-08 DIAGNOSIS — I5032 Chronic diastolic (congestive) heart failure: Secondary | ICD-10-CM | POA: Diagnosis not present

## 2016-07-08 DIAGNOSIS — Z794 Long term (current) use of insulin: Secondary | ICD-10-CM | POA: Diagnosis not present

## 2016-07-08 DIAGNOSIS — R079 Chest pain, unspecified: Secondary | ICD-10-CM | POA: Diagnosis not present

## 2016-07-08 DIAGNOSIS — R0789 Other chest pain: Secondary | ICD-10-CM | POA: Diagnosis not present

## 2016-07-08 DIAGNOSIS — E118 Type 2 diabetes mellitus with unspecified complications: Secondary | ICD-10-CM

## 2016-07-08 DIAGNOSIS — I10 Essential (primary) hypertension: Secondary | ICD-10-CM | POA: Diagnosis not present

## 2016-07-08 DIAGNOSIS — I480 Paroxysmal atrial fibrillation: Secondary | ICD-10-CM | POA: Diagnosis not present

## 2016-07-08 DIAGNOSIS — E1122 Type 2 diabetes mellitus with diabetic chronic kidney disease: Secondary | ICD-10-CM | POA: Diagnosis not present

## 2016-07-08 DIAGNOSIS — F0150 Vascular dementia without behavioral disturbance: Secondary | ICD-10-CM

## 2016-07-08 DIAGNOSIS — N184 Chronic kidney disease, stage 4 (severe): Secondary | ICD-10-CM | POA: Diagnosis not present

## 2016-07-08 LAB — BASIC METABOLIC PANEL
Anion gap: 11 (ref 5–15)
BUN: 37 mg/dL — AB (ref 6–20)
CHLORIDE: 105 mmol/L (ref 101–111)
CO2: 20 mmol/L — ABNORMAL LOW (ref 22–32)
CREATININE: 2.14 mg/dL — AB (ref 0.61–1.24)
Calcium: 9.4 mg/dL (ref 8.9–10.3)
GFR calc Af Amer: 33 mL/min — ABNORMAL LOW (ref >60)
GFR calc non Af Amer: 28 mL/min — ABNORMAL LOW (ref >60)
Glucose, Bld: 164 mg/dL — ABNORMAL HIGH (ref 65–99)
Potassium: 4.2 mmol/L (ref 3.5–5.1)
SODIUM: 136 mmol/L (ref 135–145)

## 2016-07-08 LAB — CBC
HEMATOCRIT: 41.2 % (ref 39.0–52.0)
HEMOGLOBIN: 13.9 g/dL (ref 13.0–17.0)
MCH: 30.2 pg (ref 26.0–34.0)
MCHC: 33.7 g/dL (ref 30.0–36.0)
MCV: 89.4 fL (ref 78.0–100.0)
Platelets: 147 10*3/uL — ABNORMAL LOW (ref 150–400)
RBC: 4.61 MIL/uL (ref 4.22–5.81)
RDW: 13.9 % (ref 11.5–15.5)
WBC: 7 10*3/uL (ref 4.0–10.5)

## 2016-07-08 LAB — GLUCOSE, CAPILLARY
GLUCOSE-CAPILLARY: 234 mg/dL — AB (ref 65–99)
Glucose-Capillary: 204 mg/dL — ABNORMAL HIGH (ref 65–99)
Glucose-Capillary: 291 mg/dL — ABNORMAL HIGH (ref 65–99)
Glucose-Capillary: 197 mg/dL — ABNORMAL HIGH (ref 65–99)

## 2016-07-08 MED ORDER — INSULIN NPH (HUMAN) (ISOPHANE) 100 UNIT/ML ~~LOC~~ SUSP
20.0000 [IU] | Freq: Two times a day (BID) | SUBCUTANEOUS | Status: DC
  Administered 2016-07-08: 20 [IU] via SUBCUTANEOUS
  Filled 2016-07-08: qty 10

## 2016-07-08 MED ORDER — FENOFIBRATE 160 MG PO TABS
160.0000 mg | ORAL_TABLET | Freq: Every day | ORAL | Status: DC
  Administered 2016-07-08 – 2016-07-10 (×3): 160 mg via ORAL
  Filled 2016-07-08 (×3): qty 1

## 2016-07-08 NOTE — Evaluation (Signed)
Physical Therapy Evaluation Patient Details Name: [name] MRN: [id number] DOB: May 24, 1940 Today's Date: 07/08/2016   History of Present Illness  [name] is a very pleasant quite demented 76 y.o. male with medical history significant for A. fib, hypertension, chronic kidney disease stage III, stroke, diabetes, GERD, chronic systolic heart failure, CAD, bladder cancer status post iliostomy presented to the emergency department with the chief complaint of chest pain. Patient being admitted for chest pain rule out  Clinical Impression  Patient presents with decreased mobility due to deficits listed in PT problem list.  Currently needs min A overall for mobility and was functioning with S at home.  He will benefit from skilled PT in the acute setting to allow return home with wife assist and follow up HHPT.     Follow Up Recommendations Home health PT;Supervision/Assistance - 24 hour    Equipment Recommendations  None recommended by PT    Recommendations for Other Services       Precautions / Restrictions Precautions Precautions: Fall Restrictions Weight Bearing Restrictions: No      Mobility  Bed Mobility Overal bed mobility: Needs Assistance Bed Mobility: Supine to Sit;Sit to Supine     Supine to sit: Min assist Sit to supine: Min assist   General bed mobility comments: icreased time with use of momentum to come upright, assist for safety  Transfers Overall transfer level: Needs assistance   Transfers: Sit to/from Stand Sit to Stand: Min guard;Min assist         General transfer comment: initially rises with minguard from EOB, but braces legs againts bed for balance, then pivots to chair despite cues for ambulation, then up from low chair heavy UE support min A for safety, and to sit on bed min A due to eager and impulsive to sit due to knee pain  Ambulation/Gait Ambulation/Gait assistance: Min assist;Mod assist Ambulation Distance (Feet): 125  Feet Assistive device: 1 person hand held assist Gait Pattern/deviations: Step-through pattern;Trunk flexed;Antalgic;Wide base of support     General Gait Details: very wide BOS and throwing L leg out to side at times, c/o knee pain, impulsive to sit in desk chair in hall due to pain, but needed increased assist and redirection for safety due to chair on wheels/  Stairs            Wheelchair Mobility    Modified Rankin (Stroke Patients Only)       Balance Overall balance assessment: Needs assistance   Sitting balance-Leahy Scale: Good     Standing balance support: No upper extremity supported Standing balance-Leahy Scale: Fair Standing balance comment: able to stand statically without UE support, but grabs for rails, chair, etc for ambulation and unsafe at times due to L knee pain and impulsivity                             Pertinent Vitals/Pain Pain Assessment: Faces Faces Pain Scale: Hurts even more Pain Location: L knee with ambulation Pain Descriptors / Indicators: Discomfort Pain Intervention(s): Monitored during session;Repositioned    Home Living Family/patient expects to be discharged to:: Private residence Living Arrangements: Spouse/significant other Available Help at Discharge: Family Type of Home: House Home Access: Ramped entrance     Home Layout: One level Home Equipment: Cane - single point;Walker - 2 wheels;Tub bench;Hand held shower head;Grab bars - tub/shower;Wheelchair - Education officer, community - power      Prior Function Level of Independence: Needs assistance  Gait / Transfers Assistance Needed: limited ambulation at home but uses "a stick" to ambulate, wife reports doesn't go in and out much  ADL's / Homemaking Assistance Needed: Requiring assist from wife with ADLs/IADLs        Hand Dominance   Dominant Hand: Right    Extremity/Trunk Assessment   Upper Extremity Assessment Upper Extremity Assessment: Defer to OT  evaluation    Lower Extremity Assessment Lower Extremity Assessment: Generalized weakness    Cervical / Trunk Assessment Cervical / Trunk Assessment: Kyphotic  Communication   Communication: No difficulties  Cognition Arousal/Alertness: Awake/alert Behavior During Therapy: Agitated Overall Cognitive Status: History of cognitive impairments - at baseline                                        General Comments General comments (skin integrity, edema, etc.): wife in room and feels gait abnormality is baseline, but relates she knows he needs to walk more than he does    Exercises     Assessment/Plan    PT Assessment Patient needs continued PT services  PT Problem List Decreased mobility;Decreased safety awareness;Decreased activity tolerance;Decreased knowledge of use of DME;Decreased balance;Cardiopulmonary status limiting activity;Pain       PT Treatment Interventions DME instruction;Gait training;Therapeutic activities;Therapeutic exercise;Patient/family education;Balance training;Functional mobility training    PT Goals (Current goals can be found in the Care Plan section)  Acute Rehab PT Goals Patient Stated Goal: To return home, maintain mobility PT Goal Formulation: With patient/family Time For Goal Achievement: 07/15/16 Potential to Achieve Goals: Good    Frequency Min 3X/week   Barriers to discharge        Co-evaluation               End of Session Equipment Utilized During Treatment: Gait belt Activity Tolerance: Patient limited by fatigue Patient left: in bed;with call bell/phone within reach;with family/visitor present;Other (comment) (PA in room)   PT Visit Diagnosis: Unsteadiness on feet (R26.81);Other abnormalities of gait and mobility (R26.89)    Time: 8676-1950 PT Time Calculation (min) (ACUTE ONLY): 24 min   Charges:   PT Evaluation $PT Eval Moderate Complexity: 1 Procedure PT Treatments $Gait Training: 8-22 mins   PT  G Codes:   PT G-Codes **NOT FOR INPATIENT CLASS** Functional Assessment Tool Used: AM-PAC 6 Clicks Basic Mobility Functional Limitation: Mobility: Walking and moving around Mobility: Walking and Moving Around Current Status (D3267): At least 40 percent but less than 60 percent impaired, limited or restricted Mobility: Walking and Moving Around Goal Status [phone]): At least 20 percent but less than 40 percent impaired, limited or restricted    [place], [place] 07/08/2016   Reginia Naas 07/08/2016, 2:37 PM

## 2016-07-08 NOTE — Progress Notes (Signed)
Progress Note    [name]  NAT:[id number] DOB: 29-Dec-1940  DOA: 07/07/2016 PCP: Philmore Pali, NP    Brief Narrative:   Chief complaint: Follow-up chest pain  Medical records reviewed and are as summarized below:  [name] is an 76 y.o. male medical history significant for A. fib, Dementia, hypertension, chronic kidney disease stage III, stroke, diabetes, GERD, chronic systolic heart failure, CAD, bladder cancer status post iliostomy presented to the emergency department with the chief complaint of chest pain. Patient admitted for chest pain rule out  Assessment/Plan:    Principal problem: Chest pain in a patient with a known history of CAD Heart score 5. History of CAD, diabetes, chronic kidney disease, atrial fib, hypercholesterolemia obesity.  Initial troponin negative, but subsequent troponins mildly elevated. Initial EKG without acute changes. Pain resolved on admission. Continue aspirin, statin, and beta blocker. Patient's wife reports that he has a outpatient appointment with the cardiologist next month. Cardiology consulted with plans to pursue a Lexi scan Myoview. Chest x-ray personally reviewed and shows no acute cardiopulmonary disease.  Active problems:  Atrial fibrillation  Rate controlled. CHADS2Vasc score at least an 8 with CHF, hypertension, diabetes, stroke 2, vascular disease. Home medications include low-dose aspirin, diltiazem, Toprol, continue. Not a long-term anticoagulation candidate due to unsteady gait with frequent falls. Continue to monitor on telemetry.  Diabetes Home medications include Humalog insulin twice a day. CBGs [id number]. Currently being managed with moderate scale SSI Q AC/HS and NPH 20 units twice a day.  Chronic systolic heart failure Currently compensated. Echo done last year with an EF of 50%.Monitor fluid balance with daily weights and I/oh.  Chronic kidney disease stage III Creatinine was 2 on admission, consistent with  usual baseline values.   Hypertension Continue metoprolol, Cardizem. Blood pressure reasonably controlled.  Vascular dementia Appears stable at baseline. Continue Aricept.  Obesity class I   Body mass index is 34.29 kg/m.   Dyslipidemia Triglycerides 363. LDL 28. Continue Crestor and fenofibrate.   Family Communication/Anticipated D/C date and plan/Code Status   DVT prophylaxis:SCDs  ordered. Code Status:DO NOT RESUSCITATE.  Family Communication: Wife updated at the bedside.  Disposition Plan: Home within 24 hours if stress test negative.   Medical Consultants:    Cardiology  Procedures:    None  Anti-Infectives:    None  Subjective:   Denies chest pain today.  Doesn't recall having chest pain yesterday. Pain occurred after he woke up, and told his wife that he was having pain "all over", seemed uncomfortable. When his wife asked him to show her where it hurt, he pointed to his chest, saying "my heart" and seemed restless. Wife gave him 2 baby aspirin, and he was brought to the hospital after he continued to have chest pain.    Objective:    Vitals:   07/07/16 1600 07/07/16 1652 07/07/16 2031 07/08/16 0522  BP: (!) 111/93 (!) 130/98 121/63 (!) 142/68  Pulse: (!) 42 (!) 129 84 61  Resp: 14 14 14 14   Temp:  98.1 F (36.7 C) 97.8 F (36.6 C) 98 F (36.7 C)  TempSrc:  Oral Oral Oral  SpO2: 100% 100% 100% 98%  Weight:  108.4 kg (239 lb)    Height:  5\' 10"  (1.778 m)      Intake/Output Summary (Last 24 hours) at 07/08/16 0815 Last data filed at 07/08/16 0624  Gross per 24 hour  Intake  0 ml  Output              300 ml  Net             -300 ml   Filed Weights   07/07/16 1652  Weight: 108.4 kg (239 lb)    Exam: General exam: Appears calm and comfortable.  Respiratory system: Clear to auscultation. Respiratory effort normal. Cardiovascular system: HSIR. No JVD,  rubs, gallops or clicks. No murmurs. Gastrointestinal system: Abdomen is  nondistended, soft and nontender. No organomegaly or masses felt. Normal bowel sounds heard. Central nervous system: Alert and oriented x 2. No focal neurological deficits. Extremities: No clubbing,  or cyanosis. Trace edema. Skin: No rashes, lesions or ulcers. Psychiatry: Judgement and insight appear impaired. Mood & affect appropriate.   Data Reviewed:   I have personally reviewed following labs and imaging studies:  Labs: Basic Metabolic Panel:  Recent Labs Lab 07/07/16 1626  NA 135  K 5.6*  CL 105  CO2 22  GLUCOSE 211*  BUN 36*  CREATININE 2.00*  CALCIUM 9.8   GFR Estimated Creatinine Clearance: 38.2 mL/min (A) (by C-G formula based on SCr of 2 mg/dL (H)). Liver Function Tests: No results for input(s): AST, ALT, ALKPHOS, BILITOT, PROT, ALBUMIN in the last 168 hours. No results for input(s): LIPASE, AMYLASE in the last 168 hours. No results for input(s): AMMONIA in the last 168 hours. Coagulation profile No results for input(s): INR, PROTIME in the last 168 hours.  CBC:  Recent Labs Lab 07/07/16 1405 07/08/16 0329  WBC 8.3 7.0  NEUTROABS 5.1  --   HGB 15.0 13.9  HCT 43.3 41.2  MCV 89.1 89.4  PLT 178 147*   Cardiac Enzymes:  Recent Labs Lab 07/07/16 1626 07/07/16 1851 07/07/16 2241  TROPONINI 0.04* 0.05* 0.07*   BNP (last 3 results) No results for input(s): PROBNP in the last 8760 hours. CBG:  Recent Labs Lab 07/07/16 1700 07/07/16 2153 07/08/16 0624  GLUCAP 188* 223* 204*   Lipid Profile:  Recent Labs  07/07/16 1616  CHOL 132  HDL 31*  LDLCALC 28  TRIG 363*  CHOLHDL 4.3    Microbiology No results found for this or any previous visit (from the past 240 hour(s)).  Radiology: Dg Chest 2 View  Result Date: 07/07/2016 CLINICAL DATA:  Chest discomfort. EXAM: CHEST  2 VIEW COMPARISON:  09/04/2015. FINDINGS: Mediastinum and hilar structures are normal. Lungs are clear of acute infiltrates. No pleural effusion or pneumothorax. Degenerative  changes thoracic spine. IMPRESSION: No acute cardiopulmonary disease. Electronically Signed   By: Marcello Moores  Register   On: 07/07/2016 14:40    Medications:   . aspirin EC  81 mg Oral Daily  . diltiazem  240 mg Oral Daily  . donepezil  10 mg Oral QHS  . enoxaparin (LOVENOX) injection  40 mg Subcutaneous Q24H  . finasteride  5 mg Oral Daily  . insulin aspart  0-15 Units Subcutaneous TID WC  . insulin aspart  0-5 Units Subcutaneous QHS  . metoprolol tartrate  25 mg Oral BID  . pantoprazole  40 mg Oral Q1200  . PARoxetine  20 mg Oral Daily  . rosuvastatin  10 mg Oral QHS   Continuous Infusions: . sodium chloride      Medical decision making is of high complexity and this patient is at high risk of deterioration, therefore this is a level 3 visit.  (> 4 problem points, >4 data points)   LOS: 0 days   Mertha Clyatt  [facility] Pager [phone]. If unable to reach me by pager, please call my cell phone at [phone].  *Please refer to [email or web address], password [email or web address] to get updated schedule on who will round on this patient, as hospitalists switch teams weekly. If 7PM-7AM, please contact night-coverage at [email or web address], password [email or web address] for any overnight needs.  07/08/2016, 8:15 AM

## 2016-07-08 NOTE — Consult Note (Signed)
CARDIOLOGY CONSULT NOTE   Patient ID: [name] MRN: [id number] DOB/AGE: 07/04/1940 76 y.o.  Admit date: 07/07/2016  Requesting Physician: Dr. Rockne Menghini, C Primary Physician:   Philmore Pali, NP Primary Cardiologist:   Dr. Cristopher Peru (2010) Reason for Consultation:   Chest pain         DNR  HPI: [name] is a 76 y.o. male with a complicated PMHx of history of CAD s/p stent in 1992 per chart review (unable to find records of cath report), Afib with RVR (Cardizem/Aspirin-- not on anticoagulation, lost to follow-up), Echo 09/08/2015 showed EF 40-98% normal systolic function, CKD, DM, dementia, hx of alcohol abuse, dyslipidemia, hx of colon cancer s/p colectomy and ileostomy, chronic bronchitis.  He was admitted to the hospitalist service as a chest pain rule out. The patient has dementia and story comes from the wife. He woke up complaining of left anterior chest pain. She reports he was moaning intermittently which did not improve therefore she called EMS.  His chest xray normal, hemoglobin 13.9, Creatinine 2.14. EKG; HR 85-  atrial fibrillation with PVC's. Troponins have been trended overnight  0.04 << 0.05 << 0.07 and bumping slightly.  Has not had any further pain this admission. Wife says pt is not very active  Stays at home       Past Medical History:  Diagnosis Date  . AAA (abdominal aortic aneurysm) ([place]) 1992   S/p repair and grafting  . Alzheimer's dementia dx'd ~ 04/2016  . Anxiety   . Asthma   . Benign prostatic hypertrophy    S/p TURP   . Bladder cancer ([place]) 05/2000   Transitional Cell  . CAD (coronary artery disease)    a. patient's wife reports no prior stenting  . Chronic bronchitis ([place])    "used to get it quite often; found out medicine was causing it"  . Chronic systolic CHF (congestive heart failure) (HCC)    EF 40-45% by echo 06/2011  . Colitis, ulcerative ([place])    NOS  . Colon cancer ([place]) 2007  . Depression   . Frequent falls   . GERD  (gastroesophageal reflux disease)   . Gout   . H/O ETOH abuse 06/06/2011  . History of blood transfusion 2007   "related to colon OR"  . History of hiatal hernia   . Hypercholesteremia    IIA  . Ileostomy present ([place]) 01/06/2006  . Normocytic anemia    Chronic  . Obesity   . Osteoarthritis    "knees" (07/07/2016)  . PAF (paroxysmal atrial fibrillation) (HCC)    a. during hospitalization 06/2011 for sepsis; b. not on long term, full-dose anticoagulation; c. CHADS2VASc 8 (CHF, HTN, age x 2, DM, stroke x 2, vascular dz)  . Personal hx-rectal/anal malignancy   . Pleurisy 06/2011  . Sleep apnea    "no problem since ~ 2014 when he quit drinking" (07/07/2016)  . Small bowel obstruction 09/04/2015  . Stones in the urinary tract   . Stroke [facility]) 1972   "light one", spouse denies residual on 07/07/2016  . Type II diabetes mellitus ([place]) dx'd 2001  . Uncontrolled secondary diabetes mellitus with stage 3 CKD (GFR 30-59) ([place]) 06/05/2011  . Unspecified essential hypertension   . Urinary incontinence   . Vascular dementia      Past Surgical History:  Procedure Laterality Date  . ABDOMINAL AORTIC ANEURYSM REPAIR  01/03/1991   "had 3 blockages; 3 aneurysms repaired at the same  time"  . ANKLE FRACTURE SURGERY  08/17/2003   compound fracture  . APPENDECTOMY    . CATARACT EXTRACTION W/ INTRAOCULAR LENS  IMPLANT, BILATERAL Bilateral ~ 2010  . COLECTOMY  01/06/2006   "removed colon and rectum"  . COLONOSCOPY W/ POLYPECTOMY    . CORONARY ANGIOPLASTY  12/1990  . CYSTOSCOPY W/ STONE MANIPULATION  09/1961  . EXCISIONAL TOTAL KNEE ARTHROPLASTY WITH ANTIBIOTIC SPACERS Left 08/24/2011  . EYE SURGERY    . FRACTURE SURGERY    . I&D KNEE WITH POLY EXCHANGE  06/12/2011   Procedure: IRRIGATION AND DEBRIDEMENT KNEE WITH POLY EXCHANGE;  Surgeon: Yvette Rack., MD;  Location: [facility];  Service: Orthopedics;  Laterality: Left;  . JOINT REPLACEMENT    . KNEE ARTHROSCOPY Left 07/29/1999  . TOTAL KNEE ARTHROPLASTY  Left 01/07/2008  . TOTAL KNEE REVISION  10/26/2011   Procedure: TOTAL KNEE REVISION;  Surgeon: Garald Balding, MD;  Location: [facility];  Service: Orthopedics;  Laterality: Left;  left total knee revision  . TRANSURETHRAL RESECTION OF BLADDER TUMOR WITH GYRUS (TURBT-GYRUS)  01/13/2000; 05/2000   ; "w/chemo wash, etc."  . TRANSURETHRAL RESECTION OF PROSTATE  04/22/2006    Allergies  Allergen Reactions  . Fentanyl Other (See Comments)    Delirium  . Hydromorphone Hcl Other (See Comments)    Delirium  . Lorazepam Other (See Comments)    Delirium  . Oxycodone-Acetaminophen Other (See Comments)    Delirium  . Lisinopril   . Vicodin [Hydrocodone-Acetaminophen]     I have reviewed the patient's current medications . aspirin EC  81 mg Oral Daily  . diltiazem  240 mg Oral Daily  . donepezil  10 mg Oral QHS  . enoxaparin (LOVENOX) injection  40 mg Subcutaneous Q24H  . fenofibrate  160 mg Oral Daily  . finasteride  5 mg Oral Daily  . insulin aspart  0-15 Units Subcutaneous TID WC  . insulin aspart  0-5 Units Subcutaneous QHS  . insulin NPH Human  20 Units Subcutaneous BID AC & HS  . metoprolol tartrate  25 mg Oral BID  . pantoprazole  40 mg Oral Q1200  . PARoxetine  20 mg Oral Daily  . rosuvastatin  10 mg Oral QHS   . sodium chloride     acetaminophen, albuterol, gi cocktail, morphine injection, ondansetron (ZOFRAN) IV  Prior to Admission medications   Medication Sig Start Date End Date Taking? Authorizing Provider  albuterol (PROVENTIL HFA;VENTOLIN HFA) 108 (90 BASE) MCG/ACT inhaler Inhale 2 puffs into the lungs every 6 (six) hours as needed for wheezing or shortness of breath. Reported on 06/18/2015   Yes Historical Provider, MD  aspirin EC 81 MG tablet Take 81 mg by mouth daily.   Yes Historical Provider, MD  diltiazem (CARDIZEM CD) 240 MG 24 hr capsule Take 240 mg by mouth daily. 04/08/13  Yes Historical Provider, MD  donepezil (ARICEPT) 10 MG tablet Take 1 tablet (10 mg total) by  mouth at bedtime. 01/27/16  Yes Kathrynn Ducking, MD  fenofibrate micronized (LOFIBRA) 134 MG capsule Take 134 mg by mouth daily before breakfast.   Yes Historical Provider, MD  fluocinonide cream (LIDEX) 3.71 % Apply 1 application topically daily as needed.  08/14/15  Yes Historical Provider, MD  fluticasone (FLONASE) 50 MCG/ACT nasal spray Place 1 spray into both nostrils 2 (two) times daily as needed for allergies.  08/22/15  Yes Historical Provider, MD  insulin NPH Human (HUMULIN N) 100 UNIT/ML injection Inject 0.6 mLs (60  Units total) into the skin every morning. Patient taking differently: Inject 70 Units into the skin every morning.  04/29/16  Yes Renato Shin, MD  memantine (NAMENDA) 5 MG tablet Take 1 tablet in the morning and 2 tablets at bedtime for 1 week, increase to 2 tablets BID Patient taking differently: Take 10 mg by mouth 2 (two) times daily. Take 1 tablet in the morning and 2 tablets at bedtime for 1 week, increase to 2 tablets BID 04/07/16  Yes Ward Givens, NP  metoprolol tartrate (LOPRESSOR) 25 MG tablet Take 25 mg by mouth 2 (two) times daily. 06/24/11  Yes Radene Gunning, NP  Multiple Vitamins-Minerals (CENTRUM SILVER) tablet Take 1 tablet by mouth daily.   Yes Historical Provider, MD  Omega-3 Fatty Acids (FISH OIL) 1200 MG CAPS Take 1,200 mg by mouth 2 (two) times daily.   Yes Historical Provider, MD  pantoprazole (PROTONIX) 40 MG tablet Take 40 mg by mouth daily at 12 noon. 06/24/11  Yes Lezlie Octave Black, NP  PARoxetine (PAXIL) 40 MG tablet Take 20 mg by mouth daily.  07/14/15  Yes Historical Provider, MD  rosuvastatin (CRESTOR) 20 MG tablet Take 10 mg by mouth at bedtime.    Yes Historical Provider, MD  UNKNOWN TO PATIENT Ferrous sulfate 65mg  po daily   Yes Historical Provider, MD     Social History   Social History  . Marital status: Married    Spouse name: N/A  . Number of children: 3  . Years of education: hs   Occupational History  . Retired    Social History Main  Topics  . Smoking status: Former Smoker    Packs/day: 2.00    Years: 12.00    Types: Cigarettes    Quit date: 06/04/1970  . Smokeless tobacco: Former Systems developer    Types: Chew    Quit date: 04/05/2016  . Alcohol use No     Comment: 07/07/2016 "h/o heavy drinking; quit ~ 2014"  . Drug use: No  . Sexual activity: No   Other Topics Concern  . Not on file   Social History Narrative   Married. Patient lives at home with his wife.   Patient is right handed.   Patient drinks 3 cups of caffeine per day.    Family Status  Relation Status  . Mother Deceased at age 95  . Father Deceased   APPENDIX COMPLICATION  . Sister Alive  . Brother Deceased  . Brother Deceased  . Brother Alive  . Neg Hx    Family History  Problem Relation Age of Onset  . Dementia Mother   . COPD Father   . Dementia Sister   . Cancer Brother   . Cancer Brother   . Dementia Brother   . Anesthesia problems Neg Hx   . Diabetes Neg Hx         ROS:  Full 14 point review of systems complete and found to be negative unless listed above.  Physical Exam: Blood pressure 109/62, pulse 82, temperature 98.3 F (36.8 C), temperature source Oral, resp. rate 18, height 5\' 10"  (1.778 m), weight 239 lb (108.4 kg), SpO2 99 %.  General: Well developed, well nourished, male in no acute distress Head: No xanthomas. Normocephalic and atraumatic, Lungs: normal effort and rate of breathing. Heart:: irregular irregular. No JVD  Neck: No carotid bruits. No lymphadenopathy. Abdomen: Bowel sounds present, abdomen soft and non-tender Msk:  Spontaneously moves all 4 extremities Extremities: No clubbing or cyanosis.  No le  edema  Neuro: Appropriate to questions   Moving all extremities   Psych:  Good affect, responds appropriately Skin: No rashes or lesions noted.     Labs:   Lab Results  Component Value Date   WBC 7.0 07/08/2016   HGB 13.9 07/08/2016   HCT 41.2 07/08/2016   MCV 89.4 07/08/2016   PLT 147 (L) 07/08/2016     Recent Labs Lab 07/08/16 0835  NA 136  K 4.2  CL 105  CO2 20*  BUN 37*  CREATININE 2.14*  CALCIUM 9.4  GLUCOSE 164*   Magnesium  Date Value Ref Range Status  09/08/2015 2.6 (H) 1.7 - 2.4 mg/dL Final    Recent Labs  07/07/16 1626 07/07/16 1851 07/07/16 2241  TROPONINI 0.04* 0.05* 0.07*    Recent Labs  07/07/16 1419  TROPIPOC 0.02   Pro B Natriuretic peptide (BNP)  Date/Time Value Ref Range Status  06/15/2011 05:00 AM 10,540.0 (H) 0 - 125 pg/mL Final   Lab Results  Component Value Date   CHOL 132 07/07/2016   HDL 31 (L) 07/07/2016   LDLCALC 28 07/07/2016   TRIG 363 (H) 07/07/2016     Echo   None this admission  ECG:     HR 85-  atrial fibrillation  Sl ST depression lateral leads  Unchanged from previous     Radiology:Dg Chest 2 View  Result Date: 07/07/2016 CLINICAL DATA:  Chest discomfort. EXAM: CHEST  2 VIEW COMPARISON:  09/04/2015. FINDINGS: Mediastinum and hilar structures are normal. Lungs are clear of acute infiltrates. No pleural effusion or pneumothorax. Degenerative changes thoracic spine. IMPRESSION: No acute cardiopulmonary disease. Electronically Signed   By: Marcello Moores  Register   On: 07/07/2016 14:40       ASSESSMENT AND PLAN:      Principal Problem:   Chest pain Active Problems:   Essential hypertension   BPH (benign prostatic hyperplasia)   CKD (chronic kidney disease) stage 3, GFR 30-59 ml/min   Morbid obesity (HCC)   Abnormality of gait   Diabetes (HCC)   GERD (gastroesophageal reflux disease)   PAF (paroxysmal atrial fibrillation) (HCC)   Vascular dementia   Chronic CHF ([place])   Atypical chest pain   Diabetes mellitus with complication ([place])   Signed: Linus Mako, PA-C 07/08/2016 1:27 PM   Pt seen and examined  Agree with findings as noted by Burr Medico above Pt is a 76 yo with history of CAD  Liveswith wife  Yesterday he tolder her about CP  Sharp  Did not go away  This is new for him  He says breathing may have  made it a little better. Denies pain or SOB norw.  ON exam:  Lungs are clear  Cardiac exam  RRR  No S3  Ext iwthout edema  EKG is unchanged from old EKGs Trop with trivial elevation.  Recomm: 1. WOuld recomm lexiscan myovuue to r/o signif area of ischemia  Discussed with wife if positive pt would need IV fluids to protect renal function  She is agreeable with plan   2. Paroxysmal atrial fibrillation:lost to follow-up and not on anticoagulation. Currently in afib but is rate controlled.He is not a good candidate for anticoagulation due to chronic weakness and unsteady gait  Follow rates   3  Lipdis  Trg increased at 363  LDL 28   4  HTN  Continue meds    Dorris Carnes

## 2016-07-09 ENCOUNTER — Observation Stay (HOSPITAL_BASED_OUTPATIENT_CLINIC_OR_DEPARTMENT_OTHER): Payer: Medicare Other

## 2016-07-09 DIAGNOSIS — R079 Chest pain, unspecified: Secondary | ICD-10-CM

## 2016-07-09 DIAGNOSIS — N183 Chronic kidney disease, stage 3 (moderate): Secondary | ICD-10-CM | POA: Diagnosis not present

## 2016-07-09 DIAGNOSIS — I480 Paroxysmal atrial fibrillation: Secondary | ICD-10-CM | POA: Diagnosis not present

## 2016-07-09 DIAGNOSIS — N184 Chronic kidney disease, stage 4 (severe): Secondary | ICD-10-CM

## 2016-07-09 DIAGNOSIS — Z794 Long term (current) use of insulin: Secondary | ICD-10-CM

## 2016-07-09 DIAGNOSIS — E1122 Type 2 diabetes mellitus with diabetic chronic kidney disease: Secondary | ICD-10-CM

## 2016-07-09 DIAGNOSIS — R0789 Other chest pain: Secondary | ICD-10-CM

## 2016-07-09 LAB — BASIC METABOLIC PANEL
ANION GAP: 12 (ref 5–15)
BUN: 35 mg/dL — AB (ref 6–20)
CALCIUM: 9.4 mg/dL (ref 8.9–10.3)
CO2: 22 mmol/L (ref 22–32)
Chloride: 103 mmol/L (ref 101–111)
Creatinine, Ser: 2.08 mg/dL — ABNORMAL HIGH (ref 0.61–1.24)
GFR calc Af Amer: 34 mL/min — ABNORMAL LOW (ref >60)
GFR, EST NON AFRICAN AMERICAN: 29 mL/min — AB (ref >60)
GLUCOSE: 218 mg/dL — AB (ref 65–99)
POTASSIUM: 4.4 mmol/L (ref 3.5–5.1)
Sodium: 137 mmol/L (ref 135–145)

## 2016-07-09 LAB — GLUCOSE, CAPILLARY
GLUCOSE-CAPILLARY: 164 mg/dL — AB (ref 65–99)
Glucose-Capillary: 271 mg/dL — ABNORMAL HIGH (ref 65–99)
Glucose-Capillary: 250 mg/dL — ABNORMAL HIGH (ref 65–99)
Glucose-Capillary: 210 mg/dL — ABNORMAL HIGH (ref 65–99)

## 2016-07-09 LAB — NM MYOCAR MULTI W/SPECT W/WALL MOTION / EF
CHL CUP MPHR: 143 {beats}/min
CSEPED: 5 min
CSEPEW: 1 METS
Exercise duration (sec): 14 s
Peak HR: 125 {beats}/min
Percent HR: 87 %
Rest HR: 85 {beats}/min

## 2016-07-09 MED ORDER — TECHNETIUM TC 99M TETROFOSMIN IV KIT
30.0000 | PACK | Freq: Once | INTRAVENOUS | Status: AC | PRN
  Administered 2016-07-09: 30 via INTRAVENOUS

## 2016-07-09 MED ORDER — REGADENOSON 0.4 MG/5ML IV SOLN
0.4000 mg | Freq: Once | INTRAVENOUS | Status: AC
  Administered 2016-07-09: 0.4 mg via INTRAVENOUS
  Filled 2016-07-09: qty 5

## 2016-07-09 MED ORDER — TECHNETIUM TC 99M TETROFOSMIN IV KIT
10.0000 | PACK | Freq: Once | INTRAVENOUS | Status: AC | PRN
  Administered 2016-07-09: 10 via INTRAVENOUS

## 2016-07-09 MED ORDER — REGADENOSON 0.4 MG/5ML IV SOLN
INTRAVENOUS | Status: AC
  Filled 2016-07-09: qty 5

## 2016-07-09 MED ORDER — INSULIN NPH (HUMAN) (ISOPHANE) 100 UNIT/ML ~~LOC~~ SUSP
30.0000 [IU] | Freq: Two times a day (BID) | SUBCUTANEOUS | Status: DC
  Administered 2016-07-09 – 2016-07-10 (×2): 30 [IU] via SUBCUTANEOUS

## 2016-07-09 NOTE — Progress Notes (Signed)
Progress Note    [name]  KZL:[id number] DOB: 1941/01/15  DOA: 07/07/2016 PCP: Philmore Pali, NP    Brief Narrative:   Chief complaint: Follow-up chest pain  Medical records reviewed and are as summarized below:  [name] is an 76 y.o. male medical history significant for A. fib, Dementia, hypertension, chronic kidney disease stage III, stroke, diabetes, GERD, chronic systolic heart failure, CAD, bladder cancer status post iliostomy presented to the emergency department with the chief complaint of chest pain. Patient admitted for chest pain rule out  Assessment/Plan:    Principal problem: Chest pain in a patient with a known history of CAD Heart score 5. History of CAD, diabetes, chronic kidney disease, atrial fib, hypercholesterolemia obesity.  Initial troponin negative, but subsequent troponins mildly elevated. Initial EKG without acute changes. Pain resolved on admission. Continue aspirin, statin, and beta blocker. Patient's wife reports that he has a outpatient appointment with the cardiologist next month. Cardiology consulted and underwent Lexi scan Myoview earlier today, with read pending.   Active problems:  Atrial fibrillation  Rate controlled. CHADS2Vasc score at least an 8 with CHF, hypertension, diabetes, stroke 2, vascular disease. Home medications include low-dose aspirin, diltiazem, Toprol, continue. Not a long-term anticoagulation candidate due to unsteady gait with frequent falls. Continue to monitor on telemetry.  Diabetes Home medications include Humalog insulin twice a day. CBGs [id number]. Currently being managed with moderate scale SSI Q AC/HS and NPH 20 units twice a day. Increase NPH to 30 units.  Chronic systolic heart failure Currently compensated. Echo done last year with an EF of 50%.Monitor fluid balance with daily weights and I/oh.  Chronic kidney disease stage III Creatinine was 2 on admission, consistent with usual baseline values.    Hypertension Continue metoprolol, Cardizem. Blood pressure reasonably controlled.  Vascular dementia Appears stable at baseline. Continue Aricept.  Obesity class I   Body mass index is 34.29 kg/m.   Dyslipidemia Triglycerides 363. LDL 28. Continue Crestor and fenofibrate.   Family Communication/Anticipated D/C date and plan/Code Status   DVT prophylaxis:SCDs  ordered. Code Status:DO NOT RESUSCITATE.  Family Communication: Wife updated at the bedside.  Disposition Plan: Home within 24 hours if stress test negative.   Medical Consultants:    Cardiology  Procedures:    None  Anti-Infectives:    None  Subjective:   Denies chest pain today.  Denies dyspnea. Resting comfortably in bed.  Objective:    Vitals:   07/08/16 0859 07/08/16 1313 07/08/16 2008 07/09/16 0500  BP: 140/83 109/62 (!) 144/66 129/71  Pulse: (!) 105 82 (!) 43 96  Resp:  18 20 18   Temp:  98.3 F (36.8 C) 98.4 F (36.9 C) 97.6 F (36.4 C)  TempSrc:  Oral Oral Oral  SpO2:  99% 99% 96%  Weight:      Height:        Intake/Output Summary (Last 24 hours) at 07/09/16 0834 Last data filed at 07/09/16 0016  Gross per 24 hour  Intake              720 ml  Output             2250 ml  Net            -1530 ml   Filed Weights   07/07/16 1652  Weight: 108.4 kg (239 lb)    Exam: General exam: Appears calm and comfortable. Sleepy. Respiratory system: Clear to auscultation. Respiratory effort normal. Cardiovascular system: HSIR. No  JVD,  rubs, gallops or clicks. No murmurs. Gastrointestinal system: Abdomen is nondistended, soft and nontender. No organomegaly or masses felt. Normal bowel sounds heard. Central nervous system: Sleepy. No focal neurological deficits. Extremities: No clubbing,  or cyanosis. Trace edema. Skin: No rashes, lesions or ulcers. Psychiatry: Judgement and insight appear impaired. Mood & affect appropriate.   Data Reviewed:   I have personally reviewed following labs  and imaging studies:  Labs: Basic Metabolic Panel:  Recent Labs Lab 07/07/16 1626 07/08/16 0835 07/09/16 0207  NA 135 136 137  K 5.6* 4.2 4.4  CL 105 105 103  CO2 22 20* 22  GLUCOSE 211* 164* 218*  BUN 36* 37* 35*  CREATININE 2.00* 2.14* 2.08*  CALCIUM 9.8 9.4 9.4   GFR Estimated Creatinine Clearance: 36.7 mL/min (A) (by C-G formula based on SCr of 2.08 mg/dL (H)). Liver Function Tests: No results for input(s): AST, ALT, ALKPHOS, BILITOT, PROT, ALBUMIN in the last 168 hours. No results for input(s): LIPASE, AMYLASE in the last 168 hours. No results for input(s): AMMONIA in the last 168 hours. Coagulation profile No results for input(s): INR, PROTIME in the last 168 hours.  CBC:  Recent Labs Lab 07/07/16 1405 07/08/16 0329  WBC 8.3 7.0  NEUTROABS 5.1  --   HGB 15.0 13.9  HCT 43.3 41.2  MCV 89.1 89.4  PLT 178 147*   Cardiac Enzymes:  Recent Labs Lab 07/07/16 1626 07/07/16 1851 07/07/16 2241  TROPONINI 0.04* 0.05* 0.07*   BNP (last 3 results) No results for input(s): PROBNP in the last 8760 hours. CBG:  Recent Labs Lab 07/08/16 0624 07/08/16 1101 07/08/16 1559 07/08/16 2123 07/09/16 0610  GLUCAP 204* 234* 291* 197* 164*   Lipid Profile:  Recent Labs  07/07/16 1616  CHOL 132  HDL 31*  LDLCALC 28  TRIG 363*  CHOLHDL 4.3    Microbiology No results found for this or any previous visit (from the past 240 hour(s)).  Radiology: Dg Chest 2 View  Result Date: 07/07/2016 CLINICAL DATA:  Chest discomfort. EXAM: CHEST  2 VIEW COMPARISON:  09/04/2015. FINDINGS: Mediastinum and hilar structures are normal. Lungs are clear of acute infiltrates. No pleural effusion or pneumothorax. Degenerative changes thoracic spine. IMPRESSION: No acute cardiopulmonary disease. Electronically Signed   By: Marcello Moores  Register   On: 07/07/2016 14:40    Medications:   . aspirin EC  81 mg Oral Daily  . diltiazem  240 mg Oral Daily  . donepezil  10 mg Oral QHS  .  enoxaparin (LOVENOX) injection  40 mg Subcutaneous Q24H  . fenofibrate  160 mg Oral Daily  . finasteride  5 mg Oral Daily  . insulin aspart  0-15 Units Subcutaneous TID WC  . insulin aspart  0-5 Units Subcutaneous QHS  . insulin NPH Human  20 Units Subcutaneous BID AC & HS  . metoprolol tartrate  25 mg Oral BID  . pantoprazole  40 mg Oral Q1200  . PARoxetine  20 mg Oral Daily  . rosuvastatin  10 mg Oral QHS   Continuous Infusions: . sodium chloride      Medical decision making is of high complexity and this patient is at high risk of deterioration, therefore this is a level 3 visit.  (> 4 problem points, 2 data points, high risk)   LOS: 0 days   Kinjal Neitzke  [facility] Pager [phone]. If unable to reach me by pager, please call my cell phone at [phone].  *Please refer to [email or web address], password [email or web address] to  get updated schedule on who will round on this patient, as hospitalists switch teams weekly. If 7PM-7AM, please contact night-coverage at [email or web address], password [email or web address] for any overnight needs.  07/09/2016, 8:34 AM

## 2016-07-09 NOTE — Progress Notes (Signed)
PT Cancellation Note  Patient Details Name: [name] MRN: [id number] DOB: 1940-06-05   Cancelled Treatment:    Reason Eval/Treat Not Completed: Patient at procedure or test/unavailable.  Attempted to see pt x2.  Pt out of room for procedure on both attempts.  Will f/u per POC.    Jacky Hartung E Penven-Crew 07/09/2016, 10:34 AM

## 2016-07-09 NOTE — Progress Notes (Signed)
   NST completed. Pt tolerated the study well. Radiologist interpretation pending. We will f/u with results.   Oz Gammel Rosita Fire 07/09/2016

## 2016-07-09 NOTE — Care Management Obs Status (Signed)
[facility] NOTIFICATION   Patient Details  Name: [name] MRN: [id number] Date of Birth: [birth year]   Medicare Observation Status Notification Given:  Yes    Dawayne Patricia, RN 07/09/2016, 3:14 PM

## 2016-07-09 NOTE — Progress Notes (Signed)
Progress Note  Patient Name: [name] Date of Encounter: 07/09/2016  Primary Cardiologist: Lovena Le  Last seen in 2010  Subjective   No CP  No SOB    Inpatient Medications    Scheduled Meds: . aspirin EC  81 mg Oral Daily  . diltiazem  240 mg Oral Daily  . donepezil  10 mg Oral QHS  . enoxaparin (LOVENOX) injection  40 mg Subcutaneous Q24H  . fenofibrate  160 mg Oral Daily  . finasteride  5 mg Oral Daily  . insulin aspart  0-15 Units Subcutaneous TID WC  . insulin aspart  0-5 Units Subcutaneous QHS  . insulin NPH Human  20 Units Subcutaneous BID AC & HS  . metoprolol tartrate  25 mg Oral BID  . pantoprazole  40 mg Oral Q1200  . PARoxetine  20 mg Oral Daily  . rosuvastatin  10 mg Oral QHS   Continuous Infusions: . sodium chloride     PRN Meds: acetaminophen, albuterol, gi cocktail, morphine injection, ondansetron (ZOFRAN) IV   Vital Signs    Vitals:   07/08/16 0859 07/08/16 1313 07/08/16 2008 07/09/16 0500  BP: 140/83 109/62 (!) 144/66 129/71  Pulse: (!) 105 82 (!) 43 96  Resp:  18 20 18   Temp:  98.3 F (36.8 C) 98.4 F (36.9 C) 97.6 F (36.4 C)  TempSrc:  Oral Oral Oral  SpO2:  99% 99% 96%  Weight:      Height:        Intake/Output Summary (Last 24 hours) at 07/09/16 0829 Last data filed at 07/09/16 0016  Gross per 24 hour  Intake              720 ml  Output             2250 ml  Net            -1530 ml   Filed Weights   07/07/16 1652  Weight: 239 lb (108.4 kg)    Telemetry    Not on telemetry    ECG      Physical Exam   GEN: No acute distress.   Neck: No JVD Cardiac: RRR, no murmurs, rubs, or gallops.  Respiratory: Clear to auscultation bilaterally. GI: Soft, nontender, non-distended  MS: No edema; No deformity. Neuro:  Nonfocal  Psych: Normal affect   Labs    Chemistry Recent Labs Lab 07/07/16 1626 07/08/16 0835 07/09/16 0207  NA 135 136 137  K 5.6* 4.2 4.4  CL 105 105 103  CO2 22 20* 22  GLUCOSE 211* 164* 218*    BUN 36* 37* 35*  CREATININE 2.00* 2.14* 2.08*  CALCIUM 9.8 9.4 9.4  GFRNONAA 30* 28* 29*  GFRAA 35* 33* 34*  ANIONGAP 8 11 12      Hematology Recent Labs Lab 07/07/16 1405 07/08/16 0329  WBC 8.3 7.0  RBC 4.86 4.61  HGB 15.0 13.9  HCT 43.3 41.2  MCV 89.1 89.4  MCH 30.9 30.2  MCHC 34.6 33.7  RDW 13.8 13.9  PLT 178 147*    Cardiac Enzymes Recent Labs Lab 07/07/16 1626 07/07/16 1851 07/07/16 2241  TROPONINI 0.04* 0.05* 0.07*    Recent Labs Lab 07/07/16 1419  TROPIPOC 0.02     BNPNo results for input(s): BNP, PROBNP in the last 168 hours.   DDimer No results for input(s): DDIMER in the last 168 hours.   Radiology    Dg Chest 2 View  Result Date: 07/07/2016 CLINICAL DATA:  Chest discomfort.  EXAM: CHEST  2 VIEW COMPARISON:  09/04/2015. FINDINGS: Mediastinum and hilar structures are normal. Lungs are clear of acute infiltrates. No pleural effusion or pneumothorax. Degenerative changes thoracic spine. IMPRESSION: No acute cardiopulmonary disease. Electronically Signed   By: Marcello Moores  Register   On: 07/07/2016 14:40    Cardiac Studies     Patient Profile     76 y.o. male with hstory of atrial fib  Presented with CP    Assessment & Plan    1  CP  Pt to have [place] Myovue today  2  PAF  Rate controlled  No anticoagulation due to weakness, unsteady gait  3  HTN  Fair control  Signed, Dorris Carnes, MD  07/09/2016, 8:29 AM

## 2016-07-10 DIAGNOSIS — R072 Precordial pain: Secondary | ICD-10-CM | POA: Diagnosis not present

## 2016-07-10 DIAGNOSIS — Z794 Long term (current) use of insulin: Secondary | ICD-10-CM | POA: Diagnosis not present

## 2016-07-10 DIAGNOSIS — E118 Type 2 diabetes mellitus with unspecified complications: Secondary | ICD-10-CM | POA: Diagnosis not present

## 2016-07-10 DIAGNOSIS — I10 Essential (primary) hypertension: Secondary | ICD-10-CM | POA: Diagnosis not present

## 2016-07-10 DIAGNOSIS — N184 Chronic kidney disease, stage 4 (severe): Secondary | ICD-10-CM | POA: Diagnosis not present

## 2016-07-10 DIAGNOSIS — N183 Chronic kidney disease, stage 3 (moderate): Secondary | ICD-10-CM | POA: Diagnosis not present

## 2016-07-10 DIAGNOSIS — R0789 Other chest pain: Secondary | ICD-10-CM | POA: Diagnosis not present

## 2016-07-10 DIAGNOSIS — E1122 Type 2 diabetes mellitus with diabetic chronic kidney disease: Secondary | ICD-10-CM | POA: Diagnosis not present

## 2016-07-10 DIAGNOSIS — I480 Paroxysmal atrial fibrillation: Secondary | ICD-10-CM | POA: Diagnosis not present

## 2016-07-10 DIAGNOSIS — E669 Obesity, unspecified: Secondary | ICD-10-CM | POA: Diagnosis not present

## 2016-07-10 DIAGNOSIS — F0150 Vascular dementia without behavioral disturbance: Secondary | ICD-10-CM | POA: Diagnosis not present

## 2016-07-10 DIAGNOSIS — I5032 Chronic diastolic (congestive) heart failure: Secondary | ICD-10-CM | POA: Diagnosis not present

## 2016-07-10 LAB — URINALYSIS, ROUTINE W REFLEX MICROSCOPIC
Bacteria, UA: NONE SEEN
Bilirubin Urine: NEGATIVE
GLUCOSE, UA: 50 mg/dL — AB
Hgb urine dipstick: NEGATIVE
Ketones, ur: NEGATIVE mg/dL
NITRITE: NEGATIVE
PROTEIN: 100 mg/dL — AB
Specific Gravity, Urine: 1.013 (ref 1.005–1.030)
pH: 5 (ref 5.0–8.0)

## 2016-07-10 LAB — GLUCOSE, CAPILLARY
GLUCOSE-CAPILLARY: 205 mg/dL — AB (ref 65–99)
GLUCOSE-CAPILLARY: 245 mg/dL — AB (ref 65–99)

## 2016-07-10 MED ORDER — INSULIN ASPART 100 UNIT/ML ~~LOC~~ SOLN: 0.0000 [IU] | Freq: Every day | SUBCUTANEOUS | Status: DC

## 2016-07-10 MED ORDER — INSULIN NPH (HUMAN) (ISOPHANE) 100 UNIT/ML ~~LOC~~ SUSP: 35.0000 [IU] | Freq: Two times a day (BID) | SUBCUTANEOUS | Status: DC

## 2016-07-10 MED ORDER — INSULIN ASPART 100 UNIT/ML ~~LOC~~ SOLN
0.0000 [IU] | Freq: Three times a day (TID) | SUBCUTANEOUS | Status: DC
Start: 2016-07-10 — End: 2016-07-10
  Administered 2016-07-10: 7 [IU] via SUBCUTANEOUS

## 2016-07-10 MED ORDER — NITROGLYCERIN 0.4 MG/SPRAY TL SOLN: 1.0000 | 12 refills | Status: DC | PRN

## 2016-07-10 NOTE — Discharge Summary (Signed)
Physician Discharge Summary  [name]:[id number] DOB: 06-Jul-1940 DOA: 07/07/2016  PCP: Philmore Pali, NP  Admit date: 07/07/2016 Discharge date: 07/10/2016  Admitted From: Home Discharge disposition: Home   Recommendations for Outpatient Follow-Up:   1. F/U [facility] cardiology for 2 D Echo. 2. Recommend close outpatient follow-up of glycemic control.   Discharge Diagnosis:   Principal Problem:   Chest pain Active Problems:   Essential hypertension   BPH (benign prostatic hyperplasia)   CKD (chronic kidney disease) stage 3, GFR 30-59 ml/min   Abnormality of gait   Diabetes (HCC)   GERD (gastroesophageal reflux disease)   PAF (paroxysmal atrial fibrillation) (HCC)   Vascular dementia   Chronic CHF ([place])   Atypical chest pain   Diabetes mellitus with complication (HCC)   Obesity, Class I, BMI 30.0-34.9 (see actual BMI)  Discharge Condition: Improved.  Diet recommendation: Low sodium, heart healthy.  Carbohydrate-modified.    History of Present Illness:   [name] is an 76 y.o. male medical history significant for A. fib, Dementia, hypertension, chronic kidney disease stage III, stroke, diabetes, GERD, chronic systolic heart failure, CAD, bladder cancer status post iliostomy presented to the emergency department with the chief complaint of chest pain. Patient admitted for chest pain rule out  Hospital Course by Problem:   Principal problem: Chest pain in a patient with a known history of CAD Heart score 5. History of CAD, diabetes, chronic kidney disease, atrial fib, hypercholesterolemia and obesity.  Initial troponin negative, but subsequent troponins mildly elevated. Initial EKG without acute changes. Pain resolved on admission. Cardiology consulted and underwent Lexi scan Myoview 07/09/16, with abnormal findings/high risk study.  Continue aspirin, statin, and beta blocker. The patient is not a good candidate for cardiac catheterization given his significant renal  impairment, and cardiology recommends medical management and outpatient 2-D echocardiogram. Given a prescription for nitroglycerin spray to use as needed for recurrent symptoms.  Active problems:  Atrial fibrillation  Rate controlled. CHADS2Vasc score at least an 8 with CHF, hypertension, diabetes, stroke 2, vascular disease. Home medications include low-dose aspirin, diltiazem, Toprol, continue. Not a long-term anticoagulation candidate due to unsteady gait with frequent falls. Continue to monitor on telemetry.  Diabetes Resume preadmission regimen at discharge.  Chronic systolic heart failure Currently compensated. Echo done last year with an EF of 50%. Monitor fluid balance with daily weights and I/O. Cardiologist recommends nonurgent outpatient 2-D echocardiogram.  Chronic kidney disease stage III Creatinine was 2 on admission, consistent with usual baseline values.   Hypertension Continue metoprolol, Cardizem. Blood pressure reasonably controlled.  Vascular dementia Appears stable at baseline. Continue Aricept.  Obesity class I   Body mass index is 34.29 kg/m.   Dyslipidemia/hypertriglyceridemia Triglycerides 363. LDL 28. Continue Crestor and fenofibrate.    Medical Consultants:    Cardiology   Discharge Exam:   Vitals:   07/10/16 0513 07/10/16 0949  BP: 119/82 (!) 147/111  Pulse: 94 70  Resp: 20   Temp: 97.6 F (36.4 C)    Vitals:   07/09/16 1251 07/09/16 2040 07/10/16 0513 07/10/16 0949  BP: 115/78 119/73 119/82 (!) 147/111  Pulse: (!) 126 87 94 70  Resp: 20 20 20    Temp: 98.1 F (36.7 C) 97.6 F (36.4 C) 97.6 F (36.4 C)   TempSrc: Oral Oral Oral   SpO2: 100% 99% 99% 96%  Weight:      Height:        General exam: Appears calm and comfortable.  Respiratory system:  Clear to auscultation. Respiratory effort normal. Cardiovascular system: HSIR. No JVD,  rubs, gallops or clicks. No murmurs. Gastrointestinal system: Abdomen is nondistended,  soft and nontender. No organomegaly or masses felt. Normal bowel sounds heard. Central nervous system: Alert and oriented to self only. No focal neurological deficits. Extremities: No clubbing,  or cyanosis. No edema. Skin: No rashes, lesions or ulcers. Psychiatry: Judgement and insight appear normal. Mood & affect appropriate.    The results of significant diagnostics from this hospitalization (including imaging, microbiology, ancillary and laboratory) are listed below for reference.     Procedures and Diagnostic Studies:   Dg Chest 2 View  Result Date: 07/07/2016 CLINICAL DATA:  Chest discomfort. EXAM: CHEST  2 VIEW COMPARISON:  09/04/2015. FINDINGS: Mediastinum and hilar structures are normal. Lungs are clear of acute infiltrates. No pleural effusion or pneumothorax. Degenerative changes thoracic spine. IMPRESSION: No acute cardiopulmonary disease. Electronically Signed   By: Marcello Moores  Register   On: 07/07/2016 14:40     Labs:   Basic Metabolic Panel:  Recent Labs Lab 07/07/16 1626 07/08/16 0835 07/09/16 0207  NA 135 136 137  K 5.6* 4.2 4.4  CL 105 105 103  CO2 22 20* 22  GLUCOSE 211* 164* 218*  BUN 36* 37* 35*  CREATININE 2.00* 2.14* 2.08*  CALCIUM 9.8 9.4 9.4   GFR Estimated Creatinine Clearance: 36.7 mL/min (A) (by C-G formula based on SCr of 2.08 mg/dL (H)).  CBC:  Recent Labs Lab 07/07/16 1405 07/08/16 0329  WBC 8.3 7.0  NEUTROABS 5.1  --   HGB 15.0 13.9  HCT 43.3 41.2  MCV 89.1 89.4  PLT 178 147*   Cardiac Enzymes:  Recent Labs Lab 07/07/16 1626 07/07/16 1851 07/07/16 2241  TROPONINI 0.04* 0.05* 0.07*   CBG:  Recent Labs Lab 07/09/16 1159 07/09/16 1640 07/09/16 2040 07/10/16 0652 07/10/16 1047  GLUCAP 271* 250* 210* 205* 245*   Lipid Profile  Recent Labs  07/07/16 1616  CHOL 132  HDL 31*  LDLCALC 28  TRIG 363*  CHOLHDL 4.3     Discharge Instructions:   Discharge Instructions    Call MD for:  extreme fatigue    Complete by:   As directed    Call MD for:  persistant dizziness or light-headedness    Complete by:  As directed    Call MD for:  severe uncontrolled pain    Complete by:  As directed    Diet - low sodium heart healthy    Complete by:  As directed    Diet Carb Modified    Complete by:  As directed    Increase activity slowly    Complete by:  As directed      Allergies as of 07/10/2016      Reactions   Fentanyl Other (See Comments)   Delirium   Hydromorphone Hcl Other (See Comments)   Delirium   Lorazepam Other (See Comments)   Delirium   Oxycodone-acetaminophen Other (See Comments)   Delirium   Lisinopril    Vicodin [hydrocodone-acetaminophen]       Medication List    TAKE these medications   albuterol 108 (90 Base) MCG/ACT inhaler Commonly known as:  PROVENTIL HFA;VENTOLIN HFA Inhale 2 puffs into the lungs every 6 (six) hours as needed for wheezing or shortness of breath. Reported on 06/18/2015   aspirin EC 81 MG tablet Take 81 mg by mouth daily.   CENTRUM SILVER tablet Take 1 tablet by mouth daily.   diltiazem 240 MG 24 hr  capsule Commonly known as:  CARDIZEM CD Take 240 mg by mouth daily.   donepezil 10 MG tablet Commonly known as:  ARICEPT Take 1 tablet (10 mg total) by mouth at bedtime.   fenofibrate micronized 134 MG capsule Commonly known as:  LOFIBRA Take 134 mg by mouth daily before breakfast.   Fish Oil 1200 MG Caps Take 1,200 mg by mouth 2 (two) times daily.   fluocinonide cream 0.05 % Commonly known as:  LIDEX Apply 1 application topically daily as needed.   fluticasone 50 MCG/ACT nasal spray Commonly known as:  FLONASE Place 1 spray into both nostrils 2 (two) times daily as needed for allergies.   insulin NPH Human 100 UNIT/ML injection Commonly known as:  HUMULIN N Inject 0.6 mLs (60 Units total) into the skin every morning. What changed:  how much to take   memantine 5 MG tablet Commonly known as:  NAMENDA Take 1 tablet in the morning and 2 tablets  at bedtime for 1 week, increase to 2 tablets BID What changed:  how much to take  how to take this  when to take this  additional instructions   metoprolol tartrate 25 MG tablet Commonly known as:  LOPRESSOR Take 25 mg by mouth 2 (two) times daily.   nitroGLYCERIN 0.4 MG/SPRAY spray Commonly known as:  NITROLINGUAL Place 1 spray under the tongue every 5 (five) minutes x 3 doses as needed for chest pain.   pantoprazole 40 MG tablet Commonly known as:  PROTONIX Take 40 mg by mouth daily at 12 noon.   PARoxetine 40 MG tablet Commonly known as:  PAXIL Take 20 mg by mouth daily.   rosuvastatin 20 MG tablet Commonly known as:  CRESTOR Take 10 mg by mouth at bedtime.   UNKNOWN TO PATIENT Ferrous sulfate 65mg  po daily      Follow-up Information    [facility]. Schedule an appointment as soon as possible for a visit in 1 week(s).   Specialty:  Cardiology Why:  Hospital follow up and to schedule outpatient 2-D [place] [place] information: [address], [address], [place]. Schedule an appointment as soon as possible for a visit.   Specialty:  Nurse Practitioner Why:  As needed. Contact information: [place] [address] [phone]            Time coordinating discharge: 30 minutes.  Signed:  Yuan Gann  Pager [phone] [facility] 07/10/2016, 12:48 PM

## 2016-07-10 NOTE — Progress Notes (Signed)
Progress Note  Patient Name: [name] Date of Encounter: 07/10/2016  Primary Cardiologist: Dr Harrington Challenger  Subjective   No CP  No SOB  Pt A and O x 1 (not to place or time)  Inpatient Medications    Scheduled Meds: . aspirin EC  81 mg Oral Daily  . diltiazem  240 mg Oral Daily  . donepezil  10 mg Oral QHS  . enoxaparin (LOVENOX) injection  40 mg Subcutaneous Q24H  . fenofibrate  160 mg Oral Daily  . finasteride  5 mg Oral Daily  . insulin aspart  0-20 Units Subcutaneous TID WC  . insulin aspart  0-5 Units Subcutaneous QHS  . insulin NPH Human  35 Units Subcutaneous BID AC & HS  . metoprolol tartrate  25 mg Oral BID  . pantoprazole  40 mg Oral Q1200  . PARoxetine  20 mg Oral Daily  . rosuvastatin  10 mg Oral QHS   Continuous Infusions: . sodium chloride     PRN Meds: acetaminophen, albuterol, gi cocktail, morphine injection, ondansetron (ZOFRAN) IV   Vital Signs    Vitals:   07/09/16 1251 07/09/16 2040 07/10/16 0513 07/10/16 0949  BP: 115/78 119/73 119/82 (!) 147/111  Pulse: (!) 126 87 94 70  Resp: 20 20 20    Temp: 98.1 F (36.7 C) 97.6 F (36.4 C) 97.6 F (36.4 C)   TempSrc: Oral Oral Oral   SpO2: 100% 99% 99% 96%  Weight:      Height:        Intake/Output Summary (Last 24 hours) at 07/10/16 1015 Last data filed at 07/10/16 0900  Gross per 24 hour  Intake              600 ml  Output             1076 ml  Net             -476 ml   Filed Weights   07/07/16 1652  Weight: 239 lb (108.4 kg)    Telemetry    Not on telemetry      Physical Exam   GEN: No acute distress.   Neck: No JVD Cardiac: irregular Respiratory: Clear to auscultation bilaterally. GI: Soft, nontender, non-distended  MS: No edema Neuro:  Nonfocal A and O x 1 Psych: Normal affect   Labs    Chemistry  Recent Labs Lab 07/07/16 1626 07/08/16 0835 07/09/16 0207  NA 135 136 137  K 5.6* 4.2 4.4  CL 105 105 103  CO2 22 20* 22  GLUCOSE 211* 164* 218*  BUN 36* 37* 35*    CREATININE 2.00* 2.14* 2.08*  CALCIUM 9.8 9.4 9.4  GFRNONAA 30* 28* 29*  GFRAA 35* 33* 34*  ANIONGAP 8 11 12      Hematology  Recent Labs Lab 07/07/16 1405 07/08/16 0329  WBC 8.3 7.0  RBC 4.86 4.61  HGB 15.0 13.9  HCT 43.3 41.2  MCV 89.1 89.4  MCH 30.9 30.2  MCHC 34.6 33.7  RDW 13.8 13.9  PLT 178 147*    Cardiac Enzymes  Recent Labs Lab 07/07/16 1626 07/07/16 1851 07/07/16 2241  TROPONINI 0.04* 0.05* 0.07*     Recent Labs Lab 07/07/16 1419  TROPIPOC 0.02     Radiology    Nm Myocar Multi W/spect W/wall Motion / Ef  Result Date: 07/09/2016 CLINICAL DATA:  Chest pain EXAM: MYOCARDIAL IMAGING WITH SPECT (REST AND PHARMACOLOGIC-STRESS) GATED LEFT VENTRICULAR WALL MOTION STUDY LEFT VENTRICULAR EJECTION FRACTION TECHNIQUE: Standard  myocardial SPECT imaging was performed after resting intravenous injection of 10 mCi Tc-63m tetrofosmin. Subsequently, intravenous infusion of Lexiscan was performed under the supervision of the Cardiology staff. At peak effect of the drug, 30 mCi Tc-72m tetrofosmin was injected intravenously and standard myocardial SPECT imaging was performed. Quantitative gated imaging was also performed to evaluate left ventricular wall motion, and estimate left ventricular ejection fraction. COMPARISON:  None. FINDINGS: Perfusion: Large fixed defect centered in the inferior wall but also extending into the lateral wall and septum compatible with scar/infarct. No definite or significant inducible ischemia with pharmacologic stress. Wall Motion: Global hypokinesis with apical dyskinesis noted. Left Ventricular Ejection Fraction: 20 % End diastolic volume 201 ml End systolic volume 88 ml IMPRESSION: 1. Large fixed defect centered in the inferior wall but extending into the lateral wall and septum. No significant inducible ischemia detected with pharmacologic stress. 2. Global hypokinesis with apical dyskinesis. 3. Left ventricular ejection fraction 20% 4. Non invasive  risk stratification*: High *2012 Appropriate Use Criteria for Coronary Revascularization Focused Update: J Am Coll Cardiol. 0071;21(9):758-832. http://content.airportbarriers.com.aspx?articleid=[email or web address] Electronically Signed   By: Jerilynn Mages.  Shick M.D.   On: 07/09/2016 14:58    Cardiac Studies     Patient Profile     76 y.o. male with hstory of atrial fib  Presented with CP  Dr Harrington Challenger ordered a nuclear study which shows EF 20 partially reversible inferolateral defect to my review  Assessment & Plan    1  CP-No further chest pain. Troponin minimally elevated but no clear trend and not consistent with acute coronary syndrome. Nuclear study to my review shows inferior lateral defect with partial reversibility. Diminished LV function although may not be accurate given atrial fibrillation. Very difficult situation. Pt has baseline dementia and presently is alert and oriented to person only. He also has baseline renal insufficiency and would be at risk for contrast nephropathy. I would favor medical therapy. He is also NCB. I discussed the situation with his wife and she agrees conservative measures as indicated. She understands the risk of undiagnosed coronary disease. I think this approach is appropriate. He will need an echocardiogram to assess LV function as outpt.  2  PAF  Rate controlled  Per Dr Harrington Challenger, no anticoagulation due to weakness, unsteady gait; also dementia.  3  HTN  Controlled; continue present meds.  Pt can be DCed from a cardiac standpoint and FU with Dr Harrington Challenger for cardiac issues.  Signed, Kirk Ruths, MD  07/10/2016, 10:15 AM

## 2016-07-10 NOTE — Progress Notes (Signed)
Pt/family given discharge instructions, medication lists, follow up appointments, and when to call the doctor.  Pt/family verbalizes understanding. All questions answered and discharge packet given to wife. Payton Emerald, RN

## 2016-07-13 DIAGNOSIS — R079 Chest pain, unspecified: Secondary | ICD-10-CM | POA: Diagnosis not present

## 2016-07-13 DIAGNOSIS — J449 Chronic obstructive pulmonary disease, unspecified: Secondary | ICD-10-CM | POA: Diagnosis not present

## 2016-07-13 DIAGNOSIS — Z09 Encounter for follow-up examination after completed treatment for conditions other than malignant neoplasm: Secondary | ICD-10-CM | POA: Diagnosis not present

## 2016-07-13 DIAGNOSIS — I509 Heart failure, unspecified: Secondary | ICD-10-CM | POA: Diagnosis not present

## 2016-07-13 DIAGNOSIS — Z9181 History of falling: Secondary | ICD-10-CM | POA: Diagnosis not present

## 2016-07-13 DIAGNOSIS — Z1389 Encounter for screening for other disorder: Secondary | ICD-10-CM | POA: Diagnosis not present

## 2016-07-16 ENCOUNTER — Ambulatory Visit (INDEPENDENT_AMBULATORY_CARE_PROVIDER_SITE_OTHER): Payer: Medicare Other | Admitting: Internal Medicine

## 2016-07-16 ENCOUNTER — Encounter: Payer: Self-pay | Admitting: Internal Medicine

## 2016-07-16 VITALS — BP 130/78 | HR 50 | Ht 70.0 in | Wt 239.0 lb

## 2016-07-16 DIAGNOSIS — I2510 Atherosclerotic heart disease of native coronary artery without angina pectoris: Secondary | ICD-10-CM

## 2016-07-16 MED ORDER — NITROGLYCERIN 0.4 MG SL SUBL: 0.4000 mg | SUBLINGUAL_TABLET | SUBLINGUAL | 3 refills | Status: AC | PRN

## 2016-07-16 NOTE — Progress Notes (Signed)
Cardiology Office Note   Date:  07/16/2016   ID:  [name], DOB [birth year], MRN [id number]  PCP:  Logan Pali, NP  Cardiologist:   Dorris Carnes, MD   F/U of CAD      History of Present Illness: [name] is a 76 y.o. male with a history of CAD 9s/p Cath with stent in 1992), Afib (non an anticoag candidate), CKD, DM, dementia, HL.  I saw him in the hospital in consult on 07/08/16 for CP  Pt woke up with CP   No NTG to take  Brought to [facility]  The pt was set up for a Lexiscan myovue   This showed a large inferior fixed defect  No ischemia  LVEF 20%  Pt had no further CP   Troponin minimally elevated  Due to mental status and renal dysfunction plan was for medical Rx  Family agreeded  He was to be set up for outpt echo  He comes in today with daughter and wife  [name]  Wife says he had not had any episodes of pain       Current Meds  Medication Sig  . albuterol (PROVENTIL HFA;VENTOLIN HFA) 108 (90 BASE) MCG/ACT inhaler Inhale 2 puffs into the lungs every 6 (six) hours as needed for wheezing or shortness of breath. Reported on 06/18/2015  . aspirin EC 81 MG tablet Take 81 mg by mouth daily.  Marland Kitchen diltiazem (CARDIZEM CD) 240 MG 24 hr capsule Take 240 mg by mouth daily.  Marland Kitchen donepezil (ARICEPT) 10 MG tablet Take 1 tablet (10 mg total) by mouth at bedtime.  . fenofibrate micronized (LOFIBRA) 134 MG capsule Take 134 mg by mouth daily before breakfast.  . Ferrous Sulfate (IRON) 325 (65 Fe) MG TABS Take 1 tablet by mouth daily.  . fluocinonide cream (LIDEX) 5.17 % Apply 1 application topically daily as needed.   . fluticasone (FLONASE) 50 MCG/ACT nasal spray Place 1 spray into both nostrils 2 (two) times daily as needed for allergies.   Marland Kitchen insulin NPH Human (HUMULIN N) 100 UNIT/ML injection Inject 0.6 mLs (60 Units total) into the skin every morning. (Patient taking differently: Inject 70 Units into the skin every morning. )  . memantine (NAMENDA) 10 MG tablet Take 10 mg  by mouth 2 (two) times daily.  . metoprolol tartrate (LOPRESSOR) 25 MG tablet Take 25 mg by mouth 2 (two) times daily.  . Multiple Vitamins-Minerals (CENTRUM SILVER) tablet Take 1 tablet by mouth daily.  . nitroGLYCERIN (NITROLINGUAL) 0.4 MG/SPRAY spray Place 1 spray under the tongue every 5 (five) minutes x 3 doses as needed for chest pain.  . Omega-3 Fatty Acids (FISH OIL) 1200 MG CAPS Take 1,200 mg by mouth 2 (two) times daily.  . pantoprazole (PROTONIX) 40 MG tablet Take 40 mg by mouth daily at 12 noon.  Marland Kitchen PARoxetine (PAXIL) 40 MG tablet Take 20 mg by mouth daily.   . rosuvastatin (CRESTOR) 20 MG tablet Take 10 mg by mouth at bedtime.   . [DISCONTINUED] memantine (NAMENDA) 5 MG tablet Take 1 tablet in the morning and 2 tablets at bedtime for 1 week, increase to 2 tablets BID (Patient taking differently: Take 10 mg by mouth 2 (two) times daily. Take 1 tablet in the morning and 2 tablets at bedtime for 1 week, increase to 2 tablets BID)     Allergies:   Fentanyl; Hydromorphone hcl; Lorazepam; Oxycodone-acetaminophen; Lisinopril; and Vicodin [hydrocodone-acetaminophen]   Past Medical History:  Diagnosis Date  . AAA (abdominal aortic aneurysm) ([place]) 1992   S/p repair and grafting  . Alzheimer's dementia dx'd ~ 04/2016  . Anxiety   . Asthma   . Benign prostatic hypertrophy    S/p TURP   . 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 CAD (coronary artery disease)    a. patient's wife reports no prior stenting  . Chronic bronchitis ([place])    "used to get it quite often; found out medicine was causing it"  . Chronic systolic CHF (congestive heart failure) (HCC)    EF 40-45% by echo 06/2011  . Colitis, ulcerative ([place])    NOS  . Colon cancer ([place]) 2007  . Depression   . Frequent falls   . GERD (gastroesophageal reflux disease)   . Gout   . H/O ETOH abuse 06/06/2011  . History of blood transfusion 2007   "related to colon OR"  . History of hiatal hernia   . Hypercholesteremia      IIA  . Ileostomy present ([place]) 01/06/2006  . Normocytic anemia    Chronic  . Obesity   . Osteoarthritis    "knees" (07/07/2016)  . PAF (paroxysmal atrial fibrillation) (HCC)    a. during hospitalization 06/2011 for sepsis; b. not on long term, full-dose anticoagulation; c. CHADS2VASc 8 (CHF, HTN, age x 2, DM, stroke x 2, vascular dz)  . Personal hx-rectal/anal malignancy   . Pleurisy 06/2011  . Sleep apnea    "no problem since ~ 2014 when he quit drinking" (07/07/2016)  . Small bowel obstruction 09/04/2015  . Stones in the urinary tract   . Stroke [facility]) 1972   "light one", spouse denies residual on 07/07/2016  . Type II diabetes mellitus ([place]) dx'd 2001  . Uncontrolled secondary diabetes mellitus with stage 3 CKD (GFR 30-59) ([place]) 06/05/2011  . Unspecified essential hypertension   . Urinary incontinence   . Vascular dementia     Past Surgical History:  Procedure Laterality Date  . ABDOMINAL AORTIC ANEURYSM REPAIR  01/03/1991   "had 3 blockages; 3 aneurysms repaired at the same time"  . ANKLE FRACTURE SURGERY  08/17/2003   compound fracture  . APPENDECTOMY    . CATARACT EXTRACTION W/ INTRAOCULAR LENS  IMPLANT, BILATERAL Bilateral ~ 2010  . COLECTOMY  01/06/2006   "removed colon and rectum"  . COLONOSCOPY W/ POLYPECTOMY    . CORONARY ANGIOPLASTY  12/1990  . CYSTOSCOPY W/ STONE MANIPULATION  09/1961  . EXCISIONAL TOTAL KNEE ARTHROPLASTY WITH ANTIBIOTIC SPACERS Left 08/24/2011  . EYE SURGERY    . FRACTURE SURGERY    . I&D KNEE WITH POLY EXCHANGE  06/12/2011   Procedure: IRRIGATION AND DEBRIDEMENT KNEE WITH POLY EXCHANGE;  Surgeon: Yvette Rack., MD;  Location: [facility];  Service: Orthopedics;  Laterality: Left;  . JOINT REPLACEMENT    . KNEE ARTHROSCOPY Left 07/29/1999  . TOTAL KNEE ARTHROPLASTY Left 01/07/2008  . TOTAL KNEE REVISION  10/26/2011   Procedure: TOTAL KNEE REVISION;  Surgeon: Garald Balding, MD;  Location: [facility];  Service: Orthopedics;  Laterality: Left;  left total  knee revision  . TRANSURETHRAL RESECTION OF BLADDER TUMOR WITH GYRUS (TURBT-GYRUS)  01/13/2000; 05/2000   ; "w/chemo wash, etc."  . TRANSURETHRAL RESECTION OF PROSTATE  04/22/2006     Social History:  The patient  reports that he quit smoking about 46 years ago. His smoking use included Cigarettes. He has a 24.00 pack-year smoking history. He quit smokeless tobacco use about  3 months ago. His smokeless tobacco use included Chew. He reports that he does not drink alcohol or use drugs.   Family History:  The patient's family history includes COPD in his father; Cancer in his brother and brother; Dementia in his brother, mother, and sister.    ROS:  Please see the history of present illness. All other systems are reviewed and  Negative to the above problem except as noted.    PHYSICAL EXAM: VS:  BP 130/78   Pulse (!) 50   Ht 5\' 10"  (1.778 m)   Wt 239 lb (108.4 kg) Comment: Pt wife stated his weight  SpO2 97%   BMI 34.29 kg/m   GEN: Pt in no acute distress   Examinened in wheel chair HEENT: normal  Neck: no JVD, carotid bruits, or masses Cardiac: RRR; no murmurs, rubs, or gallops,no edema  Respiratory:  clear to auscultation bilaterally, normal work of breathing GI: soft, nontender, nondistended, + BS  No hepatomegaly  MS: no deformity Moving all extremities   Skin: warm and dry, no rash Neuro:  Strength and sensation are intact Psych: euthymic mood, full affect   EKG:  EKG is not  ordered today.   Lipid Panel    Component Value Date/Time   CHOL 132 07/07/2016 1616   TRIG 363 (H) 07/07/2016 1616   HDL 31 (L) 07/07/2016 1616   CHOLHDL 4.3 07/07/2016 1616   VLDL 73 (H) 07/07/2016 1616   LDLCALC 28 07/07/2016 1616      Wt Readings from Last 3 Encounters:  07/16/16 239 lb (108.4 kg)  07/07/16 239 lb (108.4 kg)  04/29/16 251 lb (113.9 kg)      ASSESSMENT AND PLAN:  1  CAD  REcent admit for CP  Trop 0.07  When I spoke to pt's wife she had wanted to proceed with myovue    Large defect inferior/lateral.  LVEF calculated 20%  May be underestimate of real LVEF  Echo 1 year ago LVEF was normal  Daughter is here today (not seen in hospital)  We discussed findings of tests    Pt is comfortable  No evid of signif volume overload on exam  Would continue medical Rx   Hold on echo  It is very difficult with pt's dementia/agitatation to get him to appts.   Again, family agreeable to maximizing meds as needed for symptoms   Will be available as needed    Current medicines are reviewed at length with the patient today.  The patient does not have concerns regarding medicines.  Signed, Dorris Carnes, MD  07/16/2016 2:57 PM    [facility] [address], [place], [place]  [address] Phone: [phone]; Fax: [phone]

## 2016-07-16 NOTE — Patient Instructions (Addendum)
Your physician recommends that you continue on your current medications as directed. Please refer to the Current Medication list given to you today. Your physician recommends that you schedule a follow-up appointment as needed with Dr. Harrington Challenger.  Call if any problems or further complains of chest pain.

## 2016-07-21 ENCOUNTER — Encounter: Payer: Self-pay | Admitting: Adult Health

## 2016-07-21 ENCOUNTER — Other Ambulatory Visit: Payer: Self-pay

## 2016-07-21 DIAGNOSIS — E119 Type 2 diabetes mellitus without complications: Secondary | ICD-10-CM | POA: Diagnosis not present

## 2016-07-21 MED ORDER — INSULIN NPH (HUMAN) (ISOPHANE) 100 UNIT/ML ~~LOC~~ SUSP: 70.0000 [IU] | SUBCUTANEOUS | 1 refills | Status: DC

## 2016-07-28 ENCOUNTER — Encounter: Payer: Self-pay | Admitting: Endocrinology

## 2016-07-28 ENCOUNTER — Ambulatory Visit (INDEPENDENT_AMBULATORY_CARE_PROVIDER_SITE_OTHER): Payer: Medicare Other | Admitting: Endocrinology

## 2016-07-28 VITALS — BP 138/84 | HR 71 | Ht 70.0 in | Wt 250.4 lb

## 2016-07-28 DIAGNOSIS — Z794 Long term (current) use of insulin: Secondary | ICD-10-CM

## 2016-07-28 DIAGNOSIS — E1122 Type 2 diabetes mellitus with diabetic chronic kidney disease: Secondary | ICD-10-CM | POA: Diagnosis not present

## 2016-07-28 DIAGNOSIS — N401 Enlarged prostate with lower urinary tract symptoms: Secondary | ICD-10-CM | POA: Diagnosis not present

## 2016-07-28 DIAGNOSIS — N471 Phimosis: Secondary | ICD-10-CM | POA: Diagnosis not present

## 2016-07-28 DIAGNOSIS — N184 Chronic kidney disease, stage 4 (severe): Secondary | ICD-10-CM | POA: Diagnosis not present

## 2016-07-28 DIAGNOSIS — C600 Malignant neoplasm of prepuce: Secondary | ICD-10-CM | POA: Diagnosis not present

## 2016-07-28 DIAGNOSIS — C670 Malignant neoplasm of trigone of bladder: Secondary | ICD-10-CM | POA: Diagnosis not present

## 2016-07-28 LAB — POCT GLYCOSYLATED HEMOGLOBIN (HGB A1C): HEMOGLOBIN A1C: 8

## 2016-07-28 MED ORDER — INSULIN NPH (HUMAN) (ISOPHANE) 100 UNIT/ML ~~LOC~~ SUSP: 75.0000 [IU] | SUBCUTANEOUS | 1 refills | Status: AC

## 2016-07-28 NOTE — Progress Notes (Signed)
Subjective:    Patient ID: [name], male    DOB: 10-01-1940, 76 y.o.   MRN: [id number]  HPI Pt returns for f/u of diabetes mellitus: DM type: Insulin-requiring type 2.  Dx'ed: 6812 Complications: polyneuropathy, CAD, and renal failure.   Therapy: insulin since 2011.  DKA: never Severe hypoglycemia: never Pancreatitis: never Other: he takes QD insulin, due to h/o noncompliance; he takes human insulin, due to cost.   Interval history: he was hospitalized in June, 2017, and insulin was decreased.  Due to pt's memory loss, I asked wife, who says she never misses giving him insulin.   she brings a record of his cbg's which I have reviewed today.  All are checked fasting.  It varies from 112-328.  He takes 70 units, QAM.  Past Medical History:  Diagnosis Date  . AAA (abdominal aortic aneurysm) ([place]) 1992   S/p repair and grafting  . Alzheimer's dementia dx'd ~ 04/2016  . Anxiety   . Asthma   . Benign prostatic hypertrophy    S/p TURP   . 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 CAD (coronary artery disease)    a. patient's wife reports no prior stenting  . Chronic bronchitis ([place])    "used to get it quite often; found out medicine was causing it"  . Chronic systolic CHF (congestive heart failure) (HCC)    EF 40-45% by echo 06/2011  . Colitis, ulcerative ([place])    NOS  . Colon cancer ([place]) 2007  . Depression   . Frequent falls   . GERD (gastroesophageal reflux disease)   . Gout   . H/O ETOH abuse 06/06/2011  . History of blood transfusion 2007   "related to colon OR"  . History of hiatal hernia   . Hypercholesteremia    IIA  . Ileostomy present ([place]) 01/06/2006  . Normocytic anemia    Chronic  . Obesity   . Osteoarthritis    "knees" (07/07/2016)  . PAF (paroxysmal atrial fibrillation) (HCC)    a. during hospitalization 06/2011 for sepsis; b. not on long term, full-dose anticoagulation; c. CHADS2VASc 8 (CHF, HTN, age x 2, DM, stroke x 2, vascular dz)  .  Personal hx-rectal/anal malignancy   . Pleurisy 06/2011  . Sleep apnea    "no problem since ~ 2014 when he quit drinking" (07/07/2016)  . Small bowel obstruction ([place]) 09/04/2015  . Stones in the urinary tract   . Stroke [facility]) 1972   "light one", spouse denies residual on 07/07/2016  . Type II diabetes mellitus ([place]) dx'd 2001  . Uncontrolled secondary diabetes mellitus with stage 3 CKD (GFR 30-59) ([place]) 06/05/2011  . Unspecified essential hypertension   . Urinary incontinence   . Vascular dementia     Past Surgical History:  Procedure Laterality Date  . ABDOMINAL AORTIC ANEURYSM REPAIR  01/03/1991   "had 3 blockages; 3 aneurysms repaired at the same time"  . ANKLE FRACTURE SURGERY  08/17/2003   compound fracture  . APPENDECTOMY    . CATARACT EXTRACTION W/ INTRAOCULAR LENS  IMPLANT, BILATERAL Bilateral ~ 2010  . COLECTOMY  01/06/2006   "removed colon and rectum"  . COLONOSCOPY W/ POLYPECTOMY    . CORONARY ANGIOPLASTY  12/1990  . CYSTOSCOPY W/ STONE MANIPULATION  09/1961  . EXCISIONAL TOTAL KNEE ARTHROPLASTY WITH ANTIBIOTIC SPACERS Left 08/24/2011  . EYE SURGERY    . FRACTURE SURGERY    . I&D KNEE WITH POLY EXCHANGE  06/12/2011   Procedure: IRRIGATION  AND DEBRIDEMENT KNEE WITH POLY EXCHANGE;  Surgeon: Yvette Rack., MD;  [facility];  Service: Orthopedics;  Laterality: Left;  . JOINT REPLACEMENT    . KNEE ARTHROSCOPY Left 07/29/1999  . TOTAL KNEE ARTHROPLASTY Left 01/07/2008  . TOTAL KNEE REVISION  10/26/2011   Procedure: TOTAL KNEE REVISION;  Surgeon: Garald Balding, MD;  Location: [facility];  Service: Orthopedics;  Laterality: Left;  left total knee revision  . TRANSURETHRAL RESECTION OF BLADDER TUMOR WITH GYRUS (TURBT-GYRUS)  01/13/2000; 05/2000   ; "w/chemo wash, etc."  . TRANSURETHRAL RESECTION OF PROSTATE  04/22/2006    Social History   Social History  . Marital status: Married    Spouse name: N/A  . Number of children: 3  . Years of education: hs   Occupational  History  . Retired    Social History Main Topics  . Smoking status: Former Smoker    Packs/day: 2.00    Years: 12.00    Types: Cigarettes    Quit date: 06/04/1970  . Smokeless tobacco: Former Systems developer    Types: Chew    Quit date: 04/05/2016  . Alcohol use No     Comment: 07/07/2016 "h/o heavy drinking; quit ~ 2014"  . Drug use: No  . Sexual activity: No   Other Topics Concern  . Not on file   Social History Narrative   Married. Patient lives at home with his wife.   Patient is right handed.   Patient drinks 3 cups of caffeine per day.    Current Outpatient Prescriptions on File Prior to Visit  Medication Sig Dispense Refill  . albuterol (PROVENTIL HFA;VENTOLIN HFA) 108 (90 BASE) MCG/ACT inhaler Inhale 2 puffs into the lungs every 6 (six) hours as needed for wheezing or shortness of breath. Reported on 06/18/2015    . aspirin EC 81 MG tablet Take 81 mg by mouth daily.    Marland Kitchen diltiazem (CARDIZEM CD) 240 MG 24 hr capsule Take 240 mg by mouth daily.    Marland Kitchen donepezil (ARICEPT) 10 MG tablet Take 1 tablet (10 mg total) by mouth at bedtime. 90 tablet 2  . fenofibrate micronized (LOFIBRA) 134 MG capsule Take 134 mg by mouth daily before breakfast.    . Ferrous Sulfate (IRON) 325 (65 Fe) MG TABS Take 1 tablet by mouth daily.    . fluocinonide cream (LIDEX) 0.16 % Apply 1 application topically daily as needed.     . fluticasone (FLONASE) 50 MCG/ACT nasal spray Place 1 spray into both nostrils 2 (two) times daily as needed for allergies.     . memantine (NAMENDA) 10 MG tablet Take 10 mg by mouth 2 (two) times daily.    . metoprolol tartrate (LOPRESSOR) 25 MG tablet Take 25 mg by mouth 2 (two) times daily.    . Multiple Vitamins-Minerals (CENTRUM SILVER) tablet Take 1 tablet by mouth daily.    . nitroGLYCERIN (NITROSTAT) 0.4 MG SL tablet Place 1 tablet (0.4 mg total) under the tongue every 5 (five) minutes as needed for chest pain. 25 tablet 3  . Omega-3 Fatty Acids (FISH OIL) 1200 MG CAPS Take 1,200 mg  by mouth 2 (two) times daily.    . pantoprazole (PROTONIX) 40 MG tablet Take 40 mg by mouth daily at 12 noon.    Marland Kitchen PARoxetine (PAXIL) 40 MG tablet Take 20 mg by mouth daily.     . rosuvastatin (CRESTOR) 20 MG tablet Take 10 mg by mouth at bedtime.      No  current facility-administered medications on file prior to visit.     Allergies  Allergen Reactions  . Fentanyl Other (See Comments)    Delirium  . Hydromorphone Hcl Other (See Comments)    Delirium  . Lorazepam Other (See Comments)    Delirium  . Oxycodone-Acetaminophen Other (See Comments)    Delirium  . Lisinopril   . Vicodin [Hydrocodone-Acetaminophen]     Family History  Problem Relation Age of Onset  . Dementia Mother   . COPD Father   . Dementia Sister   . Cancer Brother   . Cancer Brother   . Dementia Brother   . Anesthesia problems Neg Hx   . Diabetes Neg Hx     BP 138/84   Pulse 71   Ht 5\' 10"  (1.778 m)   Wt 250 lb 6.4 oz (113.6 kg)   SpO2 98%   BMI 35.93 kg/m    Review of Systems He denies hypoglycemia.  No weight change    Objective:   Physical Exam VITAL SIGNS:  See vs page GENERAL: no distress.  In wheelchair.   Pulses: dorsalis pedis intact bilat.   MSK: no deformity of the feet.  CV: no leg edema.   Skin:  no ulcer on the feet.  normal color and temp on the feet.  Neuro: sensation is intact to touch on the feet, but decreased from normal.    Ext: There is bilateral onychomycosis of the toenails.   a1c=8.0%    Assessment & Plan:  Noncompliance with cbg's later in the day.  We discussed need to check then.  Insulin-requiring type 2 DM: his insulin requirement is re-increasing since his hospitalization a few mos ago.   Memory loss: in this context, he is not a candidate for aggressive glycemic control.   Renal failure: in this context, he does not need a longer-acting qd insulin.   Patient Instructions  check your blood sugar twice a day.  vary the time of day when you check, between  before the 3 meals, and at bedtime.  also check if you have symptoms of your blood sugar being too high or too low.  please keep a record of the readings and bring it to your next appointment here (or you can bring the meter itself).  You can write it on any piece of paper.  please call us sooner if your blood sugar goes below 70, or if you have a lot of readings over 200.   Please increase the NPH insulin to 75 units each morning, no matter what your blood sugar is.  On this type of insulin schedule, you should eat meals on a regular schedule.  If a meal is missed or significantly delayed, your blood sugar could go low.  Please come back for a follow-up appointment in 3 months.

## 2016-07-28 NOTE — Patient Instructions (Addendum)
check your blood sugar twice a day.  vary the time of day when you check, between before the 3 meals, and at bedtime.  also check if you have symptoms of your blood sugar being too high or too low.  please keep a record of the readings and bring it to your next appointment here (or you can bring the meter itself).  You can write it on any piece of paper.  please call us sooner if your blood sugar goes below 70, or if you have a lot of readings over 200.   Please increase the NPH insulin to 75 units each morning, no matter what your blood sugar is.  On this type of insulin schedule, you should eat meals on a regular schedule.  If a meal is missed or significantly delayed, your blood sugar could go low.  Please come back for a follow-up appointment in 3 months.

## 2016-07-29 DIAGNOSIS — N400 Enlarged prostate without lower urinary tract symptoms: Secondary | ICD-10-CM | POA: Diagnosis not present

## 2016-07-29 DIAGNOSIS — I509 Heart failure, unspecified: Secondary | ICD-10-CM | POA: Diagnosis not present

## 2016-07-29 DIAGNOSIS — Z01818 Encounter for other preprocedural examination: Secondary | ICD-10-CM | POA: Diagnosis not present

## 2016-07-29 DIAGNOSIS — E1129 Type 2 diabetes mellitus with other diabetic kidney complication: Secondary | ICD-10-CM | POA: Diagnosis not present

## 2016-07-30 ENCOUNTER — Telehealth: Payer: Self-pay | Admitting: Internal Medicine

## 2016-07-30 NOTE — Telephone Encounter (Signed)
New message    Request for surgical clearance:  1. What type of surgery is being performed? Prostate surgery and circumcision  2. When is this surgery scheduled?  Not schedule yet   3. Are there any medications that need to be held prior to surgery and how long? Aspirin or anything else  4. Name of physician performing surgery?  Dr Gaynelle Arabian  5. What is your office phone and fax number? Fax [phone]  Office [phone] x [phone]

## 2016-07-30 NOTE — Telephone Encounter (Signed)
I had forwarded msg to nurse in surgical office I would recomm an echo prior to surgery  Important to know LV function before proceeding

## 2016-08-02 ENCOUNTER — Telehealth: Payer: Self-pay | Admitting: Internal Medicine

## 2016-08-02 DIAGNOSIS — Z01818 Encounter for other preprocedural examination: Secondary | ICD-10-CM

## 2016-08-02 DIAGNOSIS — I2510 Atherosclerotic heart disease of native coronary artery without angina pectoris: Secondary | ICD-10-CM

## 2016-08-02 NOTE — Telephone Encounter (Signed)
New message    Pt wife is calling to schedule echo for pt. There is no order for pt.

## 2016-08-03 NOTE — Telephone Encounter (Signed)
Follow Up    Pt wife calling to follow up on surgical clearance, and echo. Requesting call back.

## 2016-08-03 NOTE — Telephone Encounter (Signed)
Placed order for echo.  Called patient's wife and left message that per Dr. Harrington Challenger patient needs echo prior to clearance.   Advised that a scheduler will call to schedule this test.   She might call back and ask to schedule if it would be quicker.   Advised when this is reviewed by Dr. Harrington Challenger we will contact her.  She is appreciative for the call.

## 2016-08-03 NOTE — Telephone Encounter (Signed)
Pt's wife called, to have surgical clearance. Pt was diagnosed with cancer of the penis foreskin. Pt  needs to have  a circumcision and prostate surgery, with Dr Gaynelle Arabian phone # [phone] Fax # [phone]. Pt will be scheduled for this procedure after clearance. Last O/V with Dr. Harrington Challenger was on 07/16/16.

## 2016-08-03 NOTE — Telephone Encounter (Signed)
Rodman Key, RN at 08/03/2016 4:53 PM   Status: Signed    Placed order for echo.  Called patient's wife and left message that per Dr. Harrington Challenger patient needs echo prior to clearance.   Advised that a scheduler will call to schedule this test.   She might call back and ask to schedule if it would be quicker.   Advised when this is reviewed by Dr. Harrington Challenger we will contact her.  She is appreciative for the call.     Lynann Bologna, RN at 08/03/2016 11:59 AM   Status: Signed    Pt's wife called, to have surgical clearance. Pt was diagnosed with cancer of the penis foreskin. Pt  needs to have  a circumcision and prostate surgery, with Dr Gaynelle Arabian phone # [phone] Fax # [phone]. Pt will be scheduled for this procedure after clearance. Last O/V with Dr. Harrington Challenger was on 07/16/16.

## 2016-08-04 ENCOUNTER — Other Ambulatory Visit: Payer: Self-pay | Admitting: *Deleted

## 2016-08-04 MED ORDER — DONEPEZIL HCL 10 MG PO TABS: 10.0000 mg | ORAL_TABLET | Freq: Every day | ORAL | 2 refills | Status: AC

## 2016-08-05 NOTE — Telephone Encounter (Signed)
Echo scheduled for 08/18/16.

## 2016-08-18 ENCOUNTER — Ambulatory Visit (HOSPITAL_COMMUNITY): Payer: Medicare Other | Attending: Cardiovascular Disease

## 2016-08-18 ENCOUNTER — Other Ambulatory Visit: Payer: Self-pay

## 2016-08-18 DIAGNOSIS — J45909 Unspecified asthma, uncomplicated: Secondary | ICD-10-CM | POA: Insufficient documentation

## 2016-08-18 DIAGNOSIS — Z01810 Encounter for preprocedural cardiovascular examination: Secondary | ICD-10-CM | POA: Insufficient documentation

## 2016-08-18 DIAGNOSIS — I2510 Atherosclerotic heart disease of native coronary artery without angina pectoris: Secondary | ICD-10-CM | POA: Diagnosis not present

## 2016-08-18 DIAGNOSIS — E119 Type 2 diabetes mellitus without complications: Secondary | ICD-10-CM | POA: Diagnosis not present

## 2016-08-18 DIAGNOSIS — I5022 Chronic systolic (congestive) heart failure: Secondary | ICD-10-CM | POA: Diagnosis not present

## 2016-08-18 DIAGNOSIS — E7800 Pure hypercholesterolemia, unspecified: Secondary | ICD-10-CM | POA: Diagnosis not present

## 2016-08-18 DIAGNOSIS — I371 Nonrheumatic pulmonary valve insufficiency: Secondary | ICD-10-CM | POA: Insufficient documentation

## 2016-08-18 DIAGNOSIS — Z01818 Encounter for other preprocedural examination: Secondary | ICD-10-CM | POA: Diagnosis not present

## 2016-08-18 DIAGNOSIS — I714 Abdominal aortic aneurysm, without rupture: Secondary | ICD-10-CM | POA: Insufficient documentation

## 2016-08-18 DIAGNOSIS — G309 Alzheimer's disease, unspecified: Secondary | ICD-10-CM | POA: Insufficient documentation

## 2016-08-18 DIAGNOSIS — F0280 Dementia in other diseases classified elsewhere without behavioral disturbance: Secondary | ICD-10-CM | POA: Insufficient documentation

## 2016-08-18 DIAGNOSIS — I081 Rheumatic disorders of both mitral and tricuspid valves: Secondary | ICD-10-CM | POA: Diagnosis not present

## 2016-08-18 DIAGNOSIS — I480 Paroxysmal atrial fibrillation: Secondary | ICD-10-CM | POA: Insufficient documentation

## 2016-08-24 ENCOUNTER — Other Ambulatory Visit: Payer: Self-pay | Admitting: Urology

## 2016-08-24 ENCOUNTER — Telehealth: Payer: Self-pay | Admitting: Internal Medicine

## 2016-08-24 NOTE — Telephone Encounter (Signed)
New message    Pam is calling to follow up on the status of a clearance for pt.

## 2016-08-24 NOTE — Telephone Encounter (Signed)
Patient needed clearance and Dr. Harrington Challenger requested echocardiogram prior to clearance. Here is the results of echocardiogram.  Notes recorded by Fay Records, MD on 08/18/2016 at 10:39 PM EDT Echo shows severe LV dysfunction  He has a large area of scar on myovue No ischemia Ovreall he is at mod increased risk for a cardiac event (ischemia, CHF ) with planned surgery Watch IV fluids if proceed.  Left message for Dr. Arlyn Leak office to call back. Routed patient's echo results to Dr. Arlyn Leak office.

## 2016-09-10 DIAGNOSIS — K56609 Unspecified intestinal obstruction, unspecified as to partial versus complete obstruction: Secondary | ICD-10-CM | POA: Diagnosis not present

## 2016-09-10 DIAGNOSIS — Z933 Colostomy status: Secondary | ICD-10-CM | POA: Diagnosis not present

## 2016-09-10 DIAGNOSIS — E119 Type 2 diabetes mellitus without complications: Secondary | ICD-10-CM | POA: Diagnosis not present

## 2016-09-10 DIAGNOSIS — Z932 Ileostomy status: Secondary | ICD-10-CM | POA: Diagnosis not present

## 2016-09-13 ENCOUNTER — Encounter (HOSPITAL_COMMUNITY)
Admission: RE | Admit: 2016-09-13 | Discharge: 2016-09-13 | Disposition: A | Discharge status: Home / Self Care | Payer: Medicare Other | Source: Ambulatory Visit | Attending: Urology | Admitting: Urology

## 2016-09-13 ENCOUNTER — Encounter (HOSPITAL_COMMUNITY): Payer: Self-pay

## 2016-09-13 DIAGNOSIS — Z01818 Encounter for other preprocedural examination: Secondary | ICD-10-CM | POA: Diagnosis not present

## 2016-09-13 DIAGNOSIS — N320 Bladder-neck obstruction: Secondary | ICD-10-CM | POA: Diagnosis not present

## 2016-09-13 DIAGNOSIS — C609 Malignant neoplasm of penis, unspecified: Secondary | ICD-10-CM | POA: Diagnosis not present

## 2016-09-13 HISTORY — DX: Adverse effect of unspecified anesthetic, initial encounter: T41.45XA

## 2016-09-13 HISTORY — DX: Other complications of anesthesia, initial encounter: T88.59XA

## 2016-09-13 LAB — CBC
HEMATOCRIT: 42.5 % (ref 39.0–52.0)
HEMOGLOBIN: 14.7 g/dL (ref 13.0–17.0)
MCH: 30.9 pg (ref 26.0–34.0)
MCHC: 34.6 g/dL (ref 30.0–36.0)
MCV: 89.5 fL (ref 78.0–100.0)
Platelets: 167 10*3/uL (ref 150–400)
RBC: 4.75 MIL/uL (ref 4.22–5.81)
RDW: 13.9 % (ref 11.5–15.5)
WBC: 9.1 10*3/uL (ref 4.0–10.5)

## 2016-09-13 LAB — COMPREHENSIVE METABOLIC PANEL
ALBUMIN: 4.3 g/dL (ref 3.5–5.0)
ALK PHOS: 73 U/L (ref 38–126)
ALT: 31 U/L (ref 17–63)
AST: 30 U/L (ref 15–41)
Anion gap: 8 (ref 5–15)
BILIRUBIN TOTAL: 0.6 mg/dL (ref 0.3–1.2)
BUN: 40 mg/dL — AB (ref 6–20)
CALCIUM: 9.6 mg/dL (ref 8.9–10.3)
CO2: 21 mmol/L — ABNORMAL LOW (ref 22–32)
CREATININE: 2.29 mg/dL — AB (ref 0.61–1.24)
Chloride: 106 mmol/L (ref 101–111)
GFR calc Af Amer: 30 mL/min — ABNORMAL LOW (ref >60)
GFR, EST NON AFRICAN AMERICAN: 26 mL/min — AB (ref >60)
GLUCOSE: 320 mg/dL — AB (ref 65–99)
Potassium: 5.4 mmol/L — ABNORMAL HIGH (ref 3.5–5.1)
Sodium: 135 mmol/L (ref 135–145)
TOTAL PROTEIN: 7.3 g/dL (ref 6.5–8.1)

## 2016-09-13 LAB — GLUCOSE, CAPILLARY: GLUCOSE-CAPILLARY: 325 mg/dL — AB (ref 65–99)

## 2016-09-13 NOTE — Patient Instructions (Signed)
[name]  09/13/2016   Your procedure is scheduled on: 09/16/16  Report to [facility] Main  Entrance Take [place]  elevators to 3rd floor to  [place] at      0900 AM.     Call this number if you have problems the morning of surgery [phone]    Remember: ONLY 1 PERSON MAY GO WITH YOU TO SHORT STAY TO GET  READY MORNING OF YOUR SURGERY.  Do not eat food or drink liquids :After Midnight.     Take these medicines the morning of surgery with A SIP OF WATER: paroxetine(paxil), Pantoprazole(protonix), metoprolol,             Memantine(namenda), diltiazem,fenofibrate, inhaler if needed  ( bring with you)                                 You may not have any metal on your body including hair pins and              piercings  Do not wear jewelry,  lotions, powders or perfumes, deodorant                        Men may shave face and neck.   Do not bring valuables to the hospital. [place].  Contacts, dentures or bridgework may not be worn into surgery.      Patients discharged the day of surgery will not be allowed to drive home.  Name and phone number of your driver:  Special Instructions: N/A              Please read over the following fact sheets you were given: _____________________________________________________________________             [facility] - Preparing for Surgery Before surgery, you can play an important role.  Because skin is not sterile, your skin needs to be as free of germs as possible.  You can reduce the number of germs on your skin by washing with CHG (chlorahexidine gluconate) soap before surgery.  CHG is an antiseptic cleaner which kills germs and bonds with the skin to continue killing germs even after washing. Please DO NOT use if you have an allergy to CHG or antibacterial soaps.  If your skin becomes reddened/irritated stop using the CHG and inform your nurse when  you arrive at Short Stay. Do not shave (including legs and underarms) for at least 48 hours prior to the first CHG shower.  You may shave your face/neck. Please follow these instructions carefully:  1.  Shower with CHG Soap the night before surgery and the  morning of Surgery.  2.  If you choose to wash your hair, wash your hair first as usual with your  normal  shampoo.  3.  After you shampoo, rinse your hair and body thoroughly to remove the  shampoo.                           4.  Use CHG as you would any other liquid soap.  You can apply chg directly  to the skin and wash  Gently with a scrungie or clean washcloth.  5.  Apply the CHG Soap to your body ONLY FROM THE NECK DOWN.   Do not use on face/ open                           Wound or open sores. Avoid contact with eyes, ears mouth and genitals (private parts).                       Wash face,  Genitals (private parts) with your normal soap.             6.  Wash thoroughly, paying special attention to the area where your surgery  will be performed.  7.  Thoroughly rinse your body with warm water from the neck down.  8.  DO NOT shower/wash with your normal soap after using and rinsing off  the CHG Soap.                9.  Pat yourself dry with a clean towel.            10.  Wear clean pajamas.            11.  Place clean sheets on your bed the night of your first shower and do not  sleep with pets. Day of Surgery : Do not apply any lotions/deodorants the morning of surgery.  Please wear clean clothes to the hospital/surgery center.  FAILURE TO FOLLOW THESE INSTRUCTIONS MAY RESULT IN THE CANCELLATION OF YOUR SURGERY PATIENT SIGNATURE_________________________________  NURSE SIGNATURE__________________________________  ________________________________________________________________________

## 2016-09-13 NOTE — Progress Notes (Signed)
CMP routed to Dr. Gaynelle Arabian done 09/13/16 routed via epic

## 2016-09-13 NOTE — Progress Notes (Signed)
Clearance 08/18/16 cards  Stress 07/09/16  echo 08/18/16  cxr 07/07/16  EKG 07/08/16  all in epic

## 2016-09-16 ENCOUNTER — Encounter (HOSPITAL_COMMUNITY)
Admission: RE | Disposition: A | Discharge status: Home / Self Care | Payer: Self-pay | Source: Ambulatory Visit | Attending: Urology

## 2016-09-16 ENCOUNTER — Observation Stay (HOSPITAL_COMMUNITY)
Admission: RE | Admit: 2016-09-16 | Discharge: 2016-09-17 | Disposition: A | Discharge status: Home / Self Care | Payer: Medicare Other | Source: Ambulatory Visit | Attending: Urology | Admitting: Urology

## 2016-09-16 ENCOUNTER — Ambulatory Visit (HOSPITAL_COMMUNITY): Payer: Medicare Other | Admitting: Anesthesiology

## 2016-09-16 ENCOUNTER — Encounter (HOSPITAL_COMMUNITY): Payer: Self-pay | Admitting: *Deleted

## 2016-09-16 DIAGNOSIS — Z888 Allergy status to other drugs, medicaments and biological substances status: Secondary | ICD-10-CM | POA: Diagnosis not present

## 2016-09-16 DIAGNOSIS — K219 Gastro-esophageal reflux disease without esophagitis: Secondary | ICD-10-CM | POA: Insufficient documentation

## 2016-09-16 DIAGNOSIS — I2510 Atherosclerotic heart disease of native coronary artery without angina pectoris: Secondary | ICD-10-CM | POA: Diagnosis not present

## 2016-09-16 DIAGNOSIS — Z96659 Presence of unspecified artificial knee joint: Secondary | ICD-10-CM | POA: Insufficient documentation

## 2016-09-16 DIAGNOSIS — E1151 Type 2 diabetes mellitus with diabetic peripheral angiopathy without gangrene: Secondary | ICD-10-CM | POA: Insufficient documentation

## 2016-09-16 DIAGNOSIS — N471 Phimosis: Secondary | ICD-10-CM | POA: Diagnosis not present

## 2016-09-16 DIAGNOSIS — F0280 Dementia in other diseases classified elsewhere without behavioral disturbance: Secondary | ICD-10-CM | POA: Insufficient documentation

## 2016-09-16 DIAGNOSIS — Z794 Long term (current) use of insulin: Secondary | ICD-10-CM | POA: Diagnosis not present

## 2016-09-16 DIAGNOSIS — Z8551 Personal history of malignant neoplasm of bladder: Secondary | ICD-10-CM | POA: Insufficient documentation

## 2016-09-16 DIAGNOSIS — N481 Balanitis: Secondary | ICD-10-CM | POA: Diagnosis not present

## 2016-09-16 DIAGNOSIS — Z85038 Personal history of other malignant neoplasm of large intestine: Secondary | ICD-10-CM | POA: Insufficient documentation

## 2016-09-16 DIAGNOSIS — D649 Anemia, unspecified: Secondary | ICD-10-CM | POA: Insufficient documentation

## 2016-09-16 DIAGNOSIS — C609 Malignant neoplasm of penis, unspecified: Secondary | ICD-10-CM | POA: Diagnosis present

## 2016-09-16 DIAGNOSIS — Z87891 Personal history of nicotine dependence: Secondary | ICD-10-CM | POA: Insufficient documentation

## 2016-09-16 DIAGNOSIS — Z7982 Long term (current) use of aspirin: Secondary | ICD-10-CM | POA: Diagnosis not present

## 2016-09-16 DIAGNOSIS — E7800 Pure hypercholesterolemia, unspecified: Secondary | ICD-10-CM | POA: Insufficient documentation

## 2016-09-16 DIAGNOSIS — Z885 Allergy status to narcotic agent status: Secondary | ICD-10-CM | POA: Insufficient documentation

## 2016-09-16 DIAGNOSIS — Z8673 Personal history of transient ischemic attack (TIA), and cerebral infarction without residual deficits: Secondary | ICD-10-CM | POA: Diagnosis not present

## 2016-09-16 DIAGNOSIS — G309 Alzheimer's disease, unspecified: Secondary | ICD-10-CM | POA: Diagnosis not present

## 2016-09-16 DIAGNOSIS — I509 Heart failure, unspecified: Secondary | ICD-10-CM | POA: Diagnosis not present

## 2016-09-16 DIAGNOSIS — F329 Major depressive disorder, single episode, unspecified: Secondary | ICD-10-CM | POA: Insufficient documentation

## 2016-09-16 DIAGNOSIS — C600 Malignant neoplasm of prepuce: Principal | ICD-10-CM | POA: Insufficient documentation

## 2016-09-16 DIAGNOSIS — I4891 Unspecified atrial fibrillation: Secondary | ICD-10-CM | POA: Diagnosis not present

## 2016-09-16 DIAGNOSIS — Z79899 Other long term (current) drug therapy: Secondary | ICD-10-CM | POA: Diagnosis not present

## 2016-09-16 DIAGNOSIS — N320 Bladder-neck obstruction: Secondary | ICD-10-CM | POA: Diagnosis present

## 2016-09-16 DIAGNOSIS — I10 Essential (primary) hypertension: Secondary | ICD-10-CM | POA: Diagnosis not present

## 2016-09-16 DIAGNOSIS — Z955 Presence of coronary angioplasty implant and graft: Secondary | ICD-10-CM | POA: Insufficient documentation

## 2016-09-16 DIAGNOSIS — N401 Enlarged prostate with lower urinary tract symptoms: Secondary | ICD-10-CM | POA: Diagnosis not present

## 2016-09-16 HISTORY — PX: THULIUM LASER TURP (TRANSURETHRAL RESECTION OF PROSTATE): SHX6744

## 2016-09-16 HISTORY — PX: CIRCUMCISION: SHX1350

## 2016-09-16 LAB — GLUCOSE, CAPILLARY
GLUCOSE-CAPILLARY: 99 mg/dL (ref 65–99)
GLUCOSE-CAPILLARY: 124 mg/dL — AB (ref 65–99)
GLUCOSE-CAPILLARY: 168 mg/dL — AB (ref 65–99)
GLUCOSE-CAPILLARY: 415 mg/dL — AB (ref 65–99)

## 2016-09-16 SURGERY — THULIUM LASER TURP (TRANSURETHRAL RESECTION OF PROSTATE)
Anesthesia: General | Site: Prostate

## 2016-09-16 MED ORDER — PHENYLEPHRINE HCL 10 MG/ML IJ SOLN
INTRAMUSCULAR | Status: AC
  Filled 2016-09-16: qty 2

## 2016-09-16 MED ORDER — ACETAMINOPHEN 10 MG/ML IV SOLN
INTRAVENOUS | Status: AC
  Filled 2016-09-16: qty 100

## 2016-09-16 MED ORDER — ROSUVASTATIN CALCIUM 20 MG PO TABS
20.0000 mg | ORAL_TABLET | Freq: Every day | ORAL | Status: DC
  Administered 2016-09-16: 20 mg via ORAL
  Filled 2016-09-16: qty 1

## 2016-09-16 MED ORDER — MEMANTINE HCL 10 MG PO TABS
10.0000 mg | ORAL_TABLET | Freq: Two times a day (BID) | ORAL | Status: DC
Start: 2016-09-16 — End: 2016-09-17
  Administered 2016-09-16 – 2016-09-17 (×2): 10 mg via ORAL
  Filled 2016-09-16 (×2): qty 1

## 2016-09-16 MED ORDER — LACTATED RINGERS IV SOLN
INTRAVENOUS | Status: DC
  Administered 2016-09-16: 09:00:00 via INTRAVENOUS

## 2016-09-16 MED ORDER — INSULIN ASPART 100 UNIT/ML ~~LOC~~ SOLN
0.0000 [IU] | Freq: Three times a day (TID) | SUBCUTANEOUS | Status: DC
  Administered 2016-09-17: 11 [IU] via SUBCUTANEOUS
  Administered 2016-09-17: 8 [IU] via SUBCUTANEOUS

## 2016-09-16 MED ORDER — INSULIN NPH (HUMAN) (ISOPHANE) 100 UNIT/ML ~~LOC~~ SUSP
75.0000 [IU] | Freq: Every day | SUBCUTANEOUS | Status: DC
  Administered 2016-09-17: 75 [IU] via SUBCUTANEOUS
  Filled 2016-09-16 (×2): qty 10

## 2016-09-16 MED ORDER — INSULIN ASPART 100 UNIT/ML ~~LOC~~ SOLN
0.0000 [IU] | Freq: Every day | SUBCUTANEOUS | Status: DC
  Administered 2016-09-16: 5 [IU] via SUBCUTANEOUS

## 2016-09-16 MED ORDER — HYOSCYAMINE SULFATE 0.125 MG SL SUBL
0.1250 mg | SUBLINGUAL_TABLET | SUBLINGUAL | Status: DC | PRN
  Administered 2016-09-17: 0.125 mg via SUBLINGUAL
  Filled 2016-09-16 (×2): qty 1

## 2016-09-16 MED ORDER — INSULIN ASPART 100 UNIT/ML ~~LOC~~ SOLN
5.0000 [IU] | Freq: Once | SUBCUTANEOUS | Status: AC
Start: 2016-09-16 — End: 2016-09-16
  Administered 2016-09-16: 5 [IU] via SUBCUTANEOUS

## 2016-09-16 MED ORDER — NITROGLYCERIN 0.4 MG SL SUBL: 0.4000 mg | SUBLINGUAL_TABLET | SUBLINGUAL | Status: DC | PRN

## 2016-09-16 MED ORDER — BUPIVACAINE HCL (PF) 0.5 % IJ SOLN
INTRAMUSCULAR | Status: AC
  Filled 2016-09-16: qty 30

## 2016-09-16 MED ORDER — DEXMEDETOMIDINE HCL 200 MCG/2ML IV SOLN
INTRAVENOUS | Status: DC | PRN
  Administered 2016-09-16 (×2): 4 ug via INTRAVENOUS
  Administered 2016-09-16: 8 ug via INTRAVENOUS
  Administered 2016-09-16: 4 ug via INTRAVENOUS
  Administered 2016-09-16: 8 ug via INTRAVENOUS
  Administered 2016-09-16: 4 ug via INTRAVENOUS

## 2016-09-16 MED ORDER — ONDANSETRON HCL 4 MG/2ML IJ SOLN
INTRAMUSCULAR | Status: DC | PRN
  Administered 2016-09-16: 4 mg via INTRAVENOUS

## 2016-09-16 MED ORDER — ONDANSETRON HCL 4 MG/2ML IJ SOLN: 4.0000 mg | Freq: Once | INTRAMUSCULAR | Status: DC | PRN

## 2016-09-16 MED ORDER — LIDOCAINE 2% (20 MG/ML) 5 ML SYRINGE
INTRAMUSCULAR | Status: AC
  Filled 2016-09-16: qty 5

## 2016-09-16 MED ORDER — ACETAMINOPHEN 325 MG PO TABS
650.0000 mg | ORAL_TABLET | ORAL | Status: DC | PRN
  Administered 2016-09-16: 650 mg via ORAL
  Filled 2016-09-16: qty 2

## 2016-09-16 MED ORDER — ETOMIDATE 2 MG/ML IV SOLN
INTRAVENOUS | Status: AC
  Filled 2016-09-16: qty 10

## 2016-09-16 MED ORDER — DONEPEZIL HCL 10 MG PO TABS
10.0000 mg | ORAL_TABLET | Freq: Every day | ORAL | Status: DC
  Administered 2016-09-16: 10 mg via ORAL
  Filled 2016-09-16: qty 1

## 2016-09-16 MED ORDER — BACITRACIN-NEOMYCIN-POLYMYXIN 400-5-5000 EX OINT: 1.0000 "application " | TOPICAL_OINTMENT | Freq: Three times a day (TID) | CUTANEOUS | Status: DC | PRN

## 2016-09-16 MED ORDER — PAROXETINE HCL 20 MG PO TABS
40.0000 mg | ORAL_TABLET | Freq: Every day | ORAL | Status: DC
  Administered 2016-09-17: 40 mg via ORAL
  Filled 2016-09-16: qty 2

## 2016-09-16 MED ORDER — PANTOPRAZOLE SODIUM 40 MG PO TBEC
40.0000 mg | DELAYED_RELEASE_TABLET | Freq: Every day | ORAL | Status: DC
  Administered 2016-09-17: 40 mg via ORAL
  Filled 2016-09-16: qty 1

## 2016-09-16 MED ORDER — PHENYLEPHRINE HCL 10 MG/ML IJ SOLN
INTRAVENOUS | Status: DC | PRN
  Administered 2016-09-16: 50 ug/min via INTRAVENOUS

## 2016-09-16 MED ORDER — MORPHINE SULFATE (PF) 4 MG/ML IV SOLN
INTRAVENOUS | Status: AC
  Filled 2016-09-16: qty 1

## 2016-09-16 MED ORDER — SODIUM CHLORIDE 0.9 % IR SOLN
Status: DC | PRN
  Administered 2016-09-16: 6000 mL via INTRAVESICAL

## 2016-09-16 MED ORDER — SODIUM CHLORIDE 0.45 % IV SOLN
INTRAVENOUS | Status: DC
  Administered 2016-09-16: 75 mL via INTRAVENOUS
  Administered 2016-09-17: 04:00:00 via INTRAVENOUS

## 2016-09-16 MED ORDER — ALBUTEROL SULFATE (2.5 MG/3ML) 0.083% IN NEBU: 3.0000 mL | INHALATION_SOLUTION | Freq: Four times a day (QID) | RESPIRATORY_TRACT | Status: DC | PRN

## 2016-09-16 MED ORDER — ACETAMINOPHEN 10 MG/ML IV SOLN
1000.0000 mg | Freq: Once | INTRAVENOUS | Status: AC
  Administered 2016-09-16: 1000 mg via INTRAVENOUS

## 2016-09-16 MED ORDER — DEXAMETHASONE SODIUM PHOSPHATE 10 MG/ML IJ SOLN
INTRAMUSCULAR | Status: AC
  Filled 2016-09-16: qty 1

## 2016-09-16 MED ORDER — ONDANSETRON HCL 4 MG/2ML IJ SOLN
INTRAMUSCULAR | Status: AC
  Filled 2016-09-16: qty 2

## 2016-09-16 MED ORDER — CEFAZOLIN SODIUM-DEXTROSE 2-4 GM/100ML-% IV SOLN
2.0000 g | INTRAVENOUS | Status: AC
  Administered 2016-09-16: 2 g via INTRAVENOUS
  Filled 2016-09-16: qty 100

## 2016-09-16 MED ORDER — FERROUS SULFATE 325 (65 FE) MG PO TABS
325.0000 mg | ORAL_TABLET | Freq: Every day | ORAL | Status: DC
  Administered 2016-09-16 – 2016-09-17 (×2): 325 mg via ORAL
  Filled 2016-09-16 (×2): qty 1

## 2016-09-16 MED ORDER — ADULT MULTIVITAMIN W/MINERALS CH
1.0000 | ORAL_TABLET | Freq: Every day | ORAL | Status: DC
  Administered 2016-09-17: 1 via ORAL
  Filled 2016-09-16: qty 1

## 2016-09-16 MED ORDER — ONDANSETRON HCL 4 MG/2ML IJ SOLN: 4.0000 mg | INTRAMUSCULAR | Status: DC | PRN

## 2016-09-16 MED ORDER — LIDOCAINE 2% (20 MG/ML) 5 ML SYRINGE
INTRAMUSCULAR | Status: DC | PRN
  Administered 2016-09-16: 100 mg via INTRAVENOUS

## 2016-09-16 MED ORDER — DIPHENHYDRAMINE HCL 12.5 MG/5ML PO ELIX: 12.5000 mg | ORAL_SOLUTION | Freq: Four times a day (QID) | ORAL | Status: DC | PRN

## 2016-09-16 MED ORDER — BUPIVACAINE HCL (PF) 0.5 % IJ SOLN
INTRAMUSCULAR | Status: DC | PRN
  Administered 2016-09-16: 30 mL

## 2016-09-16 MED ORDER — ACETAMINOPHEN 500 MG PO TABS
1000.0000 mg | ORAL_TABLET | ORAL | Status: AC
  Administered 2016-09-16: 1000 mg via ORAL
  Filled 2016-09-16: qty 2

## 2016-09-16 MED ORDER — DEXMEDETOMIDINE HCL IN NACL 200 MCG/50ML IV SOLN
INTRAVENOUS | Status: AC
  Filled 2016-09-16: qty 50

## 2016-09-16 MED ORDER — DIPHENHYDRAMINE HCL 50 MG/ML IJ SOLN: 12.5000 mg | Freq: Four times a day (QID) | INTRAMUSCULAR | Status: DC | PRN

## 2016-09-16 MED ORDER — ETOMIDATE 2 MG/ML IV SOLN
INTRAVENOUS | Status: DC | PRN
  Administered 2016-09-16: 16 mg via INTRAVENOUS

## 2016-09-16 MED ORDER — SUCCINYLCHOLINE CHLORIDE 200 MG/10ML IV SOSY
PREFILLED_SYRINGE | INTRAVENOUS | Status: AC
  Filled 2016-09-16: qty 10

## 2016-09-16 MED ORDER — METOPROLOL TARTRATE 25 MG PO TABS
25.0000 mg | ORAL_TABLET | Freq: Two times a day (BID) | ORAL | Status: DC
  Administered 2016-09-16 – 2016-09-17 (×2): 25 mg via ORAL
  Filled 2016-09-16 (×2): qty 1

## 2016-09-16 MED ORDER — STERILE WATER FOR IRRIGATION IR SOLN
Status: DC | PRN
  Administered 2016-09-16: 500 mL

## 2016-09-16 MED ORDER — DEXAMETHASONE SODIUM PHOSPHATE 10 MG/ML IJ SOLN
INTRAMUSCULAR | Status: DC | PRN
  Administered 2016-09-16: 10 mg via INTRAVENOUS

## 2016-09-16 MED ORDER — DILTIAZEM HCL ER COATED BEADS 240 MG PO CP24
240.0000 mg | ORAL_CAPSULE | Freq: Every day | ORAL | Status: DC
  Administered 2016-09-17: 240 mg via ORAL
  Filled 2016-09-16: qty 1

## 2016-09-16 SURGICAL SUPPLY — 48 items
ADH SKN CLS APL DERMABOND .7 (GAUZE/BANDAGES/DRESSINGS) ×2
BAG URINE DRAINAGE (UROLOGICAL SUPPLIES) ×4 IMPLANT
BAG URO CATCHER STRL LF (MISCELLANEOUS) ×4 IMPLANT
BANDAGE COBAN STERILE 2 (GAUZE/BANDAGES/DRESSINGS) ×2 IMPLANT
BLADE SURG 15 STRL LF DISP TIS (BLADE) ×2 IMPLANT
BLADE SURG 15 STRL SS (BLADE) ×4
BRIEF STRETCH FOR OB PAD LRG (UNDERPADS AND DIAPERS) ×4 IMPLANT
CATH FOLEY 2WAY SLVR 30CC 22FR (CATHETERS) IMPLANT
CATH HEMA 3WAY 30CC 22FR COUDE (CATHETERS) ×4 IMPLANT
COVER SURGICAL LIGHT HANDLE (MISCELLANEOUS) ×4 IMPLANT
DECANTER SPIKE VIAL GLASS SM (MISCELLANEOUS) IMPLANT
DERMABOND ADVANCED (GAUZE/BANDAGES/DRESSINGS) ×2
DERMABOND ADVANCED .7 DNX12 (GAUZE/BANDAGES/DRESSINGS) ×2 IMPLANT
DRAPE LAPAROTOMY T 102X78X121 (DRAPES) ×4 IMPLANT
DRAPE LAPAROTOMY T 98X78 PEDS (DRAPES) ×4 IMPLANT
ELECT NDL TIP 2.8 STRL (NEEDLE) ×2 IMPLANT
ELECT NEEDLE TIP 2.8 STRL (NEEDLE) ×4 IMPLANT
ELECT PENCIL ROCKER SW 15FT (MISCELLANEOUS) ×4 IMPLANT
ELECT REM PT RETURN 15FT ADLT (MISCELLANEOUS) ×4 IMPLANT
GAUZE PETROLATUM 1 X8 (GAUZE/BANDAGES/DRESSINGS) ×4 IMPLANT
GAUZE SPONGE 4X4 12PLY STRL (GAUZE/BANDAGES/DRESSINGS) ×4 IMPLANT
GAUZE SPONGE 4X4 16PLY XRAY LF (GAUZE/BANDAGES/DRESSINGS) ×6 IMPLANT
GLOVE BIO SURGEON STRL SZ 6.5 (GLOVE) ×1 IMPLANT
GLOVE BIO SURGEONS STRL SZ 6.5 (GLOVE) ×1
GLOVE BIOGEL M STRL SZ7.5 (GLOVE) ×4 IMPLANT
GLOVE BIOGEL PI IND STRL 6.5 (GLOVE) ×1 IMPLANT
GLOVE BIOGEL PI INDICATOR 6.5 (GLOVE) ×2
GOWN STRL REUS W/TWL LRG LVL3 (GOWN DISPOSABLE) ×2 IMPLANT
GOWN STRL REUS W/TWL XL LVL3 (GOWN DISPOSABLE) ×4 IMPLANT
HOLDER FOLEY CATH W/STRAP (MISCELLANEOUS) IMPLANT
KIT BASIN OR (CUSTOM PROCEDURE TRAY) ×4 IMPLANT
LASER REVOLIX PROCEDURE (MISCELLANEOUS) ×4 IMPLANT
MANIFOLD NEPTUNE II (INSTRUMENTS) ×4 IMPLANT
NDL HYPO 25X1 1.5 SAFETY (NEEDLE) ×1 IMPLANT
NEEDLE HYPO 25X1 1.5 SAFETY (NEEDLE) ×4 IMPLANT
NS IRRIG 1000ML POUR BTL (IV SOLUTION) ×4 IMPLANT
PACK BASIC VI WITH GOWN DISP (CUSTOM PROCEDURE TRAY) ×4 IMPLANT
PACK CYSTO (CUSTOM PROCEDURE TRAY) ×4 IMPLANT
PLUG CATH AND CAP STER (CATHETERS) ×4 IMPLANT
SCRUB TECHNI CARE 4 OZ NO DYE (MISCELLANEOUS) ×4 IMPLANT
SUT MNCRL AB 4-0 PS2 18 (SUTURE) ×15 IMPLANT
SYR 30ML LL (SYRINGE) IMPLANT
SYR CONTROL 10ML LL (SYRINGE) ×4 IMPLANT
SYRINGE IRR TOOMEY STRL 70CC (SYRINGE) IMPLANT
TOWEL OR 17X26 10 PK STRL BLUE (TOWEL DISPOSABLE) ×4 IMPLANT
TOWEL OR NON WOVEN STRL DISP B (DISPOSABLE) ×4 IMPLANT
TUBING CONNECTING 10 (TUBING) ×6 IMPLANT
TUBING CONNECTING 10' (TUBING) ×2

## 2016-09-16 NOTE — Anesthesia Procedure Notes (Signed)
Procedure Name: LMA Insertion Date/Time: 09/16/2016 11:31 AM Performed by: Danley Danker L Patient Re-evaluated:Patient Re-evaluated prior to inductionOxygen Delivery Method: Circle system utilized Preoxygenation: Pre-oxygenation with 100% oxygen Intubation Type: IV induction LMA: LMA inserted LMA Size: 4.0 Number of attempts: 1 Placement Confirmation: positive ETCO2 and breath sounds checked- equal and bilateral Tube secured with: Tape Dental Injury: Teeth and Oropharynx as per pre-operative assessment

## 2016-09-16 NOTE — Interval H&P Note (Signed)
History and Physical Interval Note:  09/16/2016 10:57 AM  [name]  has presented today for surgery, with the diagnosis of PENILE CANCER AND BLADDER OUTLET OBSTRUCTION  The various methods of treatment have been discussed with the patient and family. After consideration of risks, benefits and other options for treatment, the patient has consented to  Procedure(s): THULIUM LASER TURP (TRANSURETHRAL RESECTION OF PROSTATE) (N/A) CIRCUMCISION ADULT (N/A) as a surgical intervention .  The patient's history has been reviewed, patient examined, no change in status, stable for surgery.  I have reviewed the patient's chart and labs.  Questions were answered to the patient's satisfaction.     Currie Dennin I Wm Fruchter

## 2016-09-16 NOTE — Op Note (Signed)
Pre-operative diagnosis :  Cancer of the penile foreskin, recurrent bladder outlet obstruction  Postoperative diagnosis:  Same  Operation: Circumcision, cystoscopy, thulium laser of the prostate  Surgeon:  S. Gaynelle Arabian, MD  First assistant: None  Anesthesia:  Gen. LMA  Preparation: After appropriate pre-anesthesia, the patient was brought to the operative room, placed on the operating table in the dorsal supine position where general LMA anesthesia was introduced. He was then replaced in the dorsal lithotomy position with the pubis was prepped with Betadine solution and draped in usual fashion. The history was reviewed. The armband was double checked.  Review history:    CC: I have bladder cancer that has been treated.  HPI: [name] is a 76 year-old male established patient who is here for follow-up of bladder cancer treatment.  His bladder cancer was superficial and limitied to the bladder lining. His bladder cancer was not muscle invasive.   He did have a TURBT. His last bladder tumor was resected 01/12/2000. He has had the following number of bladder resections: 1. He did not have a radical cystectomy for the treatment of his bladder cancer. He did not have chemotherapy for treamtent of his bladder cancer. He did not have radiation to treat his bladder cancer. He is not here for an intravesical treatment today.   He has not had blood in his urine recently. He is not having pain in new locations. He does have a good appetite. He has not recently had unwanted weight loss.   His last cysto was 11/11/2014.     CC: I have an enlarged prostate (S/P Surgery).  HPI: He has had a TURP for treatment of his lower urinary tract symptoms due to his BPH. It was performed 04/22/2006. He has not been treated with medications in the past for his lower urinary tract symptoms. He is not on new medications for symptoms of prostate enlargement.   He does not have an abnormal sensation when  needing to urinate. He does not have a good size and strength to his urinary stream. He does have hesitancy or straining. He is not having problems with emptying his bladder well.   cystoscopy shows regrowth prostate.      CC/HPI: He has a growth on the bottom part of the foreskin.     ALLERGIES: Ativan TABS Demerol SOLN Dilaudid SOLN FentaNYL PT72 Hydromorphone Lisinopril TABS Lorazepam Oxycodone Percocet TABS Vicodin TABS     Statement of  Likelihood of Success: Excellent. TIME-OUT observed.:  Procedure:  The foreskin was outlined with a blue marking band. The foreskin could not be brought behind the glans, therefore, an incision was made in the foreskin, to allow release of the dense circumferential inflammatory bands, allowing the foreskin to be brought behind the glans. A large amount of penile inflammatory attachments were noted from the foreskin to the glans, and these were sharply and bluntly excised. All bleeding was electrocoagulated. Following this, a blue marking pen was used to outline the areas for circumcision, and circumcising incisions were made in standard fashion using a 15 blade. Following this, the foreskin was removed, allowing a collar of foreskin to remain. Using 4-0 Monocryl suture, 4 separate quadrants were created, and each quadrant was closed with interrupted 4-0 Monocryl suture. The foreskin, along with a 1.5 cm cancerous growth at the 6 clock position was sent to pathology. 30 mL of Marcaine 0.5 plain was injected at the base of the penis, to afford a penile block.  Attention was then directed  toward the prostate, and the 800  thulium laser fiber was placed through the  laserscope. Using 25,598 J of energy, with 4.15 minutes of lasing time, a trough was developed at the 7:00 position, with lateral lobe vaporization of the prostate. No bleeding was noted. A size 20 Foley catheter, two-way, with 30 of water in the balloon was placed. The patient was  awakened and taken to recovery room in good condition. He received antibiotic, and Tylenol prior to the case. Because of discussion with the patient's daughter, the patient will be followed for 23 hours in the hospital, as he has Alzheimer's disease, and has difficulty waking up from anesthesia.

## 2016-09-16 NOTE — Progress Notes (Signed)
Foley catheter irrigated with total of 120 cc Normal Saline - instilled easily- not able to withdraw it-but drains into Foley catheter bag.-110 cc drained

## 2016-09-16 NOTE — Anesthesia Postprocedure Evaluation (Signed)
Anesthesia Post Note  Patient: [name]  Procedure(s) Performed: Procedure(s) (LRB): THULIUM LASER TURP (TRANSURETHRAL RESECTION OF PROSTATE) (N/A) CIRCUMCISION ADULT (N/A)     Patient location during evaluation: PACU Anesthesia Type: General Level of consciousness: awake and alert Pain management: pain level controlled Vital Signs Assessment: post-procedure vital signs reviewed and stable Respiratory status: spontaneous breathing, nonlabored ventilation, respiratory function stable and patient connected to nasal cannula oxygen Cardiovascular status: blood pressure returned to baseline and stable Postop Assessment: no signs of nausea or vomiting Anesthetic complications: no    Last Vitals:  Vitals:   09/16/16 1430 09/16/16 1445  BP: (!) 141/78 136/71  Pulse: (!) 57 (!) 55  Resp: 13 17  Temp:  36.3 C    Last Pain:  Vitals:   09/16/16 1318  TempSrc:   PainSc: Asleep                 Catalina Gravel

## 2016-09-16 NOTE — Anesthesia Preprocedure Evaluation (Addendum)
Anesthesia Evaluation  Patient identified by MRN, date of birth, ID band Patient awake    Reviewed: Allergy & Precautions, NPO status , Patient's Chart, lab work & pertinent test results, reviewed documented beta blocker date and time   History of Anesthesia Complications (+) history of anesthetic complications  Airway Mallampati: II  TM Distance: >3 FB Neck ROM: Full    Dental  (+) Dental Advisory Given, Edentulous Lower, Edentulous Upper   Pulmonary asthma , sleep apnea , former smoker,    Pulmonary exam normal breath sounds clear to auscultation       Cardiovascular hypertension, Pt. on medications and Pt. on home beta blockers + CAD, + Cardiac Stents, + Peripheral Vascular Disease (AAA s/p repair) and +CHF  + dysrhythmias Atrial Fibrillation  Rhythm:Irregular Rate:Normal  Echo 08/18/16: Study Conclusions  - Procedure narrative: DEFINITY not administered during this study due to patient&'s mental status. - Left ventricle: Diffuse hypokinesis worse in the inferior wall. The cavity size was moderately dilated. Wall thickness was increased in a pattern of severe LVH. The estimated ejection fraction was 25%. - Mitral valve: There was mild regurgitation. - Left atrium: The atrium was moderately dilated. - Atrial septum: No defect or patent foramen ovale was identified.   Neuro/Psych PSYCHIATRIC DISORDERS Anxiety Depression Alzheimer's dementia  CVA, No Residual Symptoms    GI/Hepatic PUD, GERD  Controlled and Medicated,(+)     substance abuse  alcohol use, Ulcerative colitis Colon cancer s/p ileostomy     Endo/Other  diabetes, Type 2, Insulin DependentObesity   Renal/GU Renal InsufficiencyRenal disease   Bladder cancer    Musculoskeletal  (+) Arthritis , Osteoarthritis,    Abdominal   Peds  Hematology  (+) Blood dyscrasia, anemia ,   Anesthesia Other Findings Day of surgery medications reviewed with the  patient.  Reproductive/Obstetrics                           Anesthesia Physical Anesthesia Plan  ASA: IV  Anesthesia Plan: General   Post-op Pain Management:    Induction: Intravenous  PONV Risk Score and Plan: 3 and Ondansetron, Dexamethasone and Treatment may vary due to age or medical condition  Airway Management Planned: LMA  Additional Equipment:   Intra-op Plan:   Post-operative Plan: Extubation in OR  Informed Consent: I have reviewed the patients History and Physical, chart, labs and discussed the procedure including the risks, benefits and alternatives for the proposed anesthesia with the patient or authorized representative who has indicated his/her understanding and acceptance.   Dental advisory given  Plan Discussed with: CRNA  Anesthesia Plan Comments: (Risks/benefits of general anesthesia discussed with patient including risk of damage to teeth, lips, gum, and tongue, nausea/vomiting, allergic reactions to medications, and the possibility of heart attack, stroke and death.  All patient questions answered.  Patient wishes to proceed.)       Anesthesia Quick Evaluation

## 2016-09-16 NOTE — Progress Notes (Signed)
Dr. Gaynelle Arabian notified of Foley catheter irrigation  Situation- orders given - I.V. Fluid rate increased to 75 cc/hour/

## 2016-09-16 NOTE — Progress Notes (Signed)
Dr. Gaynelle Arabian notified of patient's urine output in PACU- 10 cc orders given

## 2016-09-16 NOTE — H&P (Signed)
--------------------------------------------------------------------------------   [name]  MRN: [id number]  PRIMARY CARE:  Maura L. Hamrick, MD  DOB: May 23, 1940, 76 year old Male  REFERRING:    S PROVIDER:  Carolan Clines, M.D.    LOCATION:  [facility] [phone]   --------------------------------------------------------------------------------   CC: I have bladder cancer that has been treated.  HPI: [name] is a 76 year-old male established patient who is here for follow-up of bladder cancer treatment.  His bladder cancer was superficial and limitied to the bladder lining. His bladder cancer was not muscle invasive.   He did have a TURBT. His last bladder tumor was resected 01/12/2000. He has had the following number of bladder resections: 1. He did not have a radical cystectomy for the treatment of his bladder cancer. He did not have chemotherapy for treamtent of his bladder cancer. He did not have radiation to treat his bladder cancer. He is not here for an intravesical treatment today.   He has not had blood in his urine recently. He is not having pain in new locations. He does have a good appetite. He has not recently had unwanted weight loss.   His last cysto was 11/11/2014.     CC: I have an enlarged prostate (S/P Surgery).  HPI: He has had a TURP for treatment of his lower urinary tract symptoms due to his BPH. It was performed 04/22/2006. He has not been treated with medications in the past for his lower urinary tract symptoms. He is not on new medications for symptoms of prostate enlargement.   He does not have an abnormal sensation when needing to urinate. He does not have a good size and strength to his urinary stream. He does have hesitancy or straining. He is not having problems with emptying his bladder well.   cystoscopy shows regrowth prostate.      CC/HPI: He has a growth on the bottom part of the foreskin.      ALLERGIES: Ativan TABS Demerol SOLN Dilaudid SOLN FentaNYL PT72 Hydromorphone Lisinopril TABS Lorazepam Oxycodone Percocet TABS Vicodin TABS    MEDICATIONS: Albuterol Sulfate  Aspirin Ec 81 mg tablet, delayed release Oral  Centrum Silver  Crestor 20 mg tablet Oral  Daily Vitamin tablet Oral  Diltiazem 24Hr Cd 240 mg capsule, ext release 24 hr  Donepezil Hcl Odt 10 mg tablet,disintegrating Oral  Fenofibrate 134 mg capsule  Fish Oil CAPS Oral  Fluocinonide 0.05 % cream  Fluticasone Propionate 50 mcg/actuation spray, suspension  Iron 236 mg (27 mg iron) tablet Oral  Lantus 100 unit/ml vial Subcutaneous  Memantine Hcl 10 mg tablet  Metoprolol Tartrate 25 MG Oral Tablet Oral  Nitroglycerin 0.4 mg tablet, sublingual  Novolog 100 unit/ml vial Subcutaneous  Pantoprazole Sodium 40 mg tablet, delayed release Oral  Paroxetine Hcl 40 mg tablet     GU PSH: Cystoscopy TURBT 2-5 cm - 2001 Cystoscopy TURP - 2008    NON-GU PSH: Abdominal aortic aneurysm repair (open) Revise Knee Joint Total Knee Replacement    GU PMH: History of bladder cancer, History of malignant neoplasm of bladder - 05/14/2015 BPH w/LUTS, Benign prostatic hyperplasia with urinary obstruction - 11/11/2014 Dysuria, Dysuria - 2016 Oth GU systems Signs/Symptoms, Bladder pain - 2016 ED due to arterial insufficiency, Erectile dysfunction due to arterial insufficiency - 2014 Personal Hx Oth Urinary System diseases, History of chronic kidney disease - 2014      PMH Notes:  2006-03-30 10:10:05 - Note: Anxiety  2006-03-30 10:10:05 -  Note: Cancer  2006-03-30 10:10:05 - Note: Ischemic Stroke   NON-GU PMH: Bacteriuria, Asymptomatic bacteriuria - 05/14/2015 Type 2 diabetes mellitus with hyperglycemia, Diabetes type 2, uncontrolled - 11/11/2014 Encounter for general adult medical examination without abnormal findings, Encounter for preventive health examination - 2016 Glycosuria, Glycosuria - 2016 Gout, Gout - 2014 Personal  history of other diseases of the circulatory system, History of hypertension - 2014 Personal history of other diseases of the digestive system, History of esophageal reflux - 2014 Personal history of other endocrine, nutritional and metabolic disease, History of hypercholesterolemia - 2014, History of diabetes mellitus, - 2014 Personal history of other mental and behavioral disorders, History of depression - 2014    FAMILY HISTORY: Cancer - Brother   SOCIAL HISTORY: Marital Status: Married Current Smoking Status: Patient does not smoke anymore. Smoked for 20 years. Smoked 2 packs per day.   Tobacco Use Assessment Completed: Used Tobacco in last 30 days? Patient's occupation is/was Retired.     Notes: 2 sons, 1 daughter   REVIEW OF SYSTEMS:    GU Review Male:   foreskin growth. Patient reports get up at night to urinate, stream starts and stops, trouble starting your stream, have to strain to urinate , and erection problems. Patient denies frequent urination, hard to postpone urination, burning/ pain with urination, leakage of urine, and penile pain.  Gastrointestinal (Upper):   Patient denies nausea, vomiting, and indigestion/ heartburn.  Gastrointestinal (Lower):   Patient denies diarrhea and constipation.  Constitutional:   Patient reports fatigue. Patient denies fever, night sweats, and weight loss.  Skin:   Patient denies itching and skin rash/ lesion.  Eyes:   Patient denies blurred vision and double vision.  Ears/ Nose/ Throat:   Patient denies sore throat and sinus problems.  Hematologic/Lymphatic:   Patient denies swollen glands and easy bruising.  Cardiovascular:   Patient reports chest pains. Patient denies leg swelling.  Respiratory:   Patient denies cough and shortness of breath.  Endocrine:   Patient denies excessive thirst.  Musculoskeletal:   Patient denies back pain and joint pain.  Neurological:   Patient reports headaches. Patient denies dizziness.  Psychologic:    Patient reports depression and anxiety.    VITAL SIGNS:      07/28/2016 11:29 AM  BP 106/72 mmHg  Pulse 62 /min  Temperature 97.4 F / 36 C   GU PHYSICAL EXAMINATION:    Penis: Penis uncircumcised. Foreskin cancer, 6 o'clock position with surrounding edema., Phimosis. no cracks. No dorsal peyronie's plaques, no left corporal peyronie's plaques, no right corporal peyronie's plaques, no scarring, no shaft warts. No balanitis, no meatal stenosis.    MULTI-SYSTEM PHYSICAL EXAMINATION:       PAST DATA REVIEWED:  Source Of History:  Patient  Records Review:   Previous Patient Records   07/08/10 04/08/09 05/17/07  PSA  Total PSA 0.16  0.15  0.08     PROCEDURES:         Flexible Cystoscopy - 52000  Risks, benefits, and some of the potential complications of the procedure were discussed at length with the patient including infection, bleeding, voiding discomfort, urinary retention, fever, chills, sepsis, and others. All questions were answered. Informed consent was obtained. Antibiotic prophylaxis was given. Sterile technique and intraurethral analgesia were used.  Meatus:  Normal size. Normal location. Normal condition.  Urethra:  No strictures.  External Sphincter:  Normal.  Verumontanum:  Normal.  Prostate:  Non-obstructing. No hyperplasia.  Bladder Neck:  Non-obstructing.  Ureteral Orifices:  Normal location. Normal size. Normal shape. Effluxed clear urine.  Bladder:  No trabeculation. No tumors. Normal mucosa. No stones.      The lower urinary tract was carefully examined. The procedure was well-tolerated and without complications. Antibiotic instructions were given. Instructions were given to call the office immediately for bloody urine, difficulty urinating, urinary retention, painful or frequent urination, fever, chills, nausea, vomiting or other illness. The patient stated that he understood these instructions and would comply with them.   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 He has additional history of chronic renal disease, with additional history of diabetes, treated by Dr. Dwyane Dee. In addition, she is a history of bladder cancer, BPH, post TURP in 2008 for urinary retention. He has a remote history of colorectal carcinoma, post colostomy rectal oversew, with postop colitis, and abdominal abscess. His last TCC of the bladder was 2001, noted to be on the left trigone.   Note his past history is cigarette for ischemic stroke, atrial fibrillation, as well as gout, depression, and elevated cholesterol.   I discussed the situation with patient's wife. She notes that he continues to be obstructed, from prostatic regrowth, and needs to have circumcision for the cancerous growth on the end of his foreskin. This was requiring 1 as an outpatient using the thulium laser for his prostate, and a circumcision for his foreskin cancer.    PLAN:           Schedule Return Visit/Planned Activity: Next Available Appointment - Schedule Surgery          Document Letter(s):  Created for Patient: Clinical Summary         Notes:   cc: Dr. Michel Harrow, [place]         The information contained in this medical record document is considered private and confidential patient information. This information can only be used for the medical diagnosis and/or medical services that are being provided by the patient's selected caregivers. This information can only be distributed outside of the patient's care if the patient agrees and signs waivers of authorization for this information to be sent to an outside source or route.

## 2016-09-16 NOTE — Transfer of Care (Signed)
Immediate Anesthesia Transfer of Care Note  Patient: [name]  Procedure(s) Performed: Procedure(s): THULIUM LASER TURP (TRANSURETHRAL RESECTION OF PROSTATE) (N/A) CIRCUMCISION ADULT (N/A)  Patient Location: PACU  Anesthesia Type:General  Level of Consciousness: awake and pateint uncooperative  Airway & Oxygen Therapy: Patient Spontanous Breathing and Patient connected to face mask oxygen  Post-op Assessment: Report given to RN and Post -op Vital signs reviewed and stable  Post vital signs: Reviewed and stable  Last Vitals:  Vitals:   09/16/16 0901 09/16/16 1046  BP: 126/78 115/82  Pulse: 60 62  Resp: 16 18  Temp: 36.6 C 36.6 C    Last Pain:  Vitals:   09/16/16 1046  TempSrc: Oral         Complications: No apparent anesthesia complications

## 2016-09-17 ENCOUNTER — Encounter (HOSPITAL_COMMUNITY): Payer: Self-pay | Admitting: Urology

## 2016-09-17 DIAGNOSIS — N481 Balanitis: Secondary | ICD-10-CM | POA: Diagnosis not present

## 2016-09-17 DIAGNOSIS — N471 Phimosis: Secondary | ICD-10-CM | POA: Diagnosis not present

## 2016-09-17 DIAGNOSIS — Z96659 Presence of unspecified artificial knee joint: Secondary | ICD-10-CM | POA: Diagnosis not present

## 2016-09-17 DIAGNOSIS — Z885 Allergy status to narcotic agent status: Secondary | ICD-10-CM | POA: Diagnosis not present

## 2016-09-17 DIAGNOSIS — K219 Gastro-esophageal reflux disease without esophagitis: Secondary | ICD-10-CM | POA: Diagnosis not present

## 2016-09-17 DIAGNOSIS — Z8551 Personal history of malignant neoplasm of bladder: Secondary | ICD-10-CM | POA: Diagnosis not present

## 2016-09-17 DIAGNOSIS — Z79899 Other long term (current) drug therapy: Secondary | ICD-10-CM | POA: Diagnosis not present

## 2016-09-17 DIAGNOSIS — I4891 Unspecified atrial fibrillation: Secondary | ICD-10-CM | POA: Diagnosis not present

## 2016-09-17 DIAGNOSIS — Z8673 Personal history of transient ischemic attack (TIA), and cerebral infarction without residual deficits: Secondary | ICD-10-CM | POA: Diagnosis not present

## 2016-09-17 DIAGNOSIS — D649 Anemia, unspecified: Secondary | ICD-10-CM | POA: Diagnosis not present

## 2016-09-17 DIAGNOSIS — G309 Alzheimer's disease, unspecified: Secondary | ICD-10-CM | POA: Diagnosis not present

## 2016-09-17 DIAGNOSIS — E1151 Type 2 diabetes mellitus with diabetic peripheral angiopathy without gangrene: Secondary | ICD-10-CM | POA: Diagnosis not present

## 2016-09-17 DIAGNOSIS — Z7982 Long term (current) use of aspirin: Secondary | ICD-10-CM | POA: Diagnosis not present

## 2016-09-17 DIAGNOSIS — N320 Bladder-neck obstruction: Secondary | ICD-10-CM | POA: Diagnosis not present

## 2016-09-17 DIAGNOSIS — C600 Malignant neoplasm of prepuce: Secondary | ICD-10-CM | POA: Diagnosis not present

## 2016-09-17 DIAGNOSIS — E7800 Pure hypercholesterolemia, unspecified: Secondary | ICD-10-CM | POA: Diagnosis not present

## 2016-09-17 DIAGNOSIS — Z87891 Personal history of nicotine dependence: Secondary | ICD-10-CM | POA: Diagnosis not present

## 2016-09-17 DIAGNOSIS — Z85038 Personal history of other malignant neoplasm of large intestine: Secondary | ICD-10-CM | POA: Diagnosis not present

## 2016-09-17 DIAGNOSIS — I10 Essential (primary) hypertension: Secondary | ICD-10-CM | POA: Diagnosis not present

## 2016-09-17 DIAGNOSIS — I2510 Atherosclerotic heart disease of native coronary artery without angina pectoris: Secondary | ICD-10-CM | POA: Diagnosis not present

## 2016-09-17 DIAGNOSIS — Z888 Allergy status to other drugs, medicaments and biological substances status: Secondary | ICD-10-CM | POA: Diagnosis not present

## 2016-09-17 DIAGNOSIS — Z794 Long term (current) use of insulin: Secondary | ICD-10-CM | POA: Diagnosis not present

## 2016-09-17 LAB — GLUCOSE, CAPILLARY
GLUCOSE-CAPILLARY: 290 mg/dL — AB (ref 65–99)
GLUCOSE-CAPILLARY: 322 mg/dL — AB (ref 65–99)

## 2016-09-17 MED ORDER — HYOSCYAMINE SULFATE SL 0.125 MG SL SUBL: 0.1250 mg | SUBLINGUAL_TABLET | Freq: Two times a day (BID) | SUBLINGUAL | 0 refills | Status: AC | PRN

## 2016-09-17 MED ORDER — BACITRACIN-NEOMYCIN-POLYMYXIN 400-5-5000 EX OINT: 1.0000 "application " | TOPICAL_OINTMENT | Freq: Two times a day (BID) | CUTANEOUS | 0 refills | Status: AC

## 2016-09-17 MED ORDER — KETOROLAC TROMETHAMINE 30 MG/ML IJ SOLN
30.0000 mg | Freq: Four times a day (QID) | INTRAMUSCULAR | Status: DC | PRN
  Administered 2016-09-17: 30 mg via INTRAVENOUS
  Filled 2016-09-17: qty 1

## 2016-09-17 MED ORDER — KETOROLAC TROMETHAMINE 30 MG/ML IJ SOLN
30.0000 mg | Freq: Four times a day (QID) | INTRAMUSCULAR | Status: DC
Start: 2016-09-17 — End: 2016-09-17

## 2016-09-17 NOTE — Progress Notes (Signed)
Pt voided 100 ml. Bladder scan performed showing <16 ml.  Pt now having leaking/incont. Will continue to monitor. [name] A

## 2016-09-17 NOTE — Discharge Instructions (Signed)
Post transurethral resection of the prostate (TURP) instructions  Your recent prostate surgery requires very special post hospital care. Despite the fact that no skin incisions were used the area around the prostate incision is quite raw and is covered with a scab to promote healing and prevent bleeding. Certain cautions are needed to assure that the scab is not disturbed of the next 2-3 weeks while the healing proceeds.  Because the raw surface in your prostate and the irritating effects of urine you may expect frequency of urination and/or urgency (a stronger desire to urinate) and perhaps even getting up at night more often. This will usually resolve or improve slowly over the healing period. You may see some blood in your urine over the first 6 weeks. Do not be alarmed, even if the urine was clear for a while. Get off your feet and drink lots of fluids until clearing occurs. If you start to pass clots or don't improve call us.  Diet:  You may return to your normal diet immediately. Because of the raw surface of your bladder, alcohol, spicy foods, foods high in acid and drinks with caffeine may cause irritation or frequency and should be used in moderation. To keep your urine flowing freely and avoid constipation, drink plenty of fluids during the day (8-10 glasses). Tip: Avoid cranberry juice because it is very acidic.  Activity:  Your physical activity doesn't need to be restricted. However, if you are very active, you may see some blood in the urine. We suggest that you reduce your activity under the circumstances until the bleeding has stopped.  Bowels:  It is important to keep your bowels regular during the postoperative period. Straining with bowel movements can cause bleeding. A bowel movement every other day is reasonable. Use a mild laxative if needed, such as milk of magnesia 2-3 tablespoons, or 2 Dulcolax tablets. Call if you continue to have problems. If you had been taking narcotics  for pain, before, during or after your surgery, you may be constipated. Take a laxative if necessary.  Medication:  You should resume your pre-surgery medications unless told not to. In addition you may be given an antibiotic to prevent or treat infection. Antibiotics are not always necessary. All medication should be taken as prescribed until the bottles are finished unless you are having an unusual reaction to one of the drugs.     Problems you should report to [place]:  a. Fever greater than 101F. b. Heavy bleeding, or clots (see notes above about blood in urine). c. Inability to urinate. d. Drug reactions (hives, rash, nausea, vomiting, diarrhea). e. Severe burning or pain with urination that is not improving. Circumcision-Home Care Instructions  The following instructions have been prepared to help you care for yourself upon your return home today.   Wound Care & Hygiene:   You may apply ice to the penis.  This may help to decrease swelling.  Remove the dressing tomorrow.  If the dressing falls off before then, leave it off.  You may shower or bathe in 48 hours  Gently wash the penis with soap and water.  The stitches do not need to be removed.  Activity:  Do not drive or operate any equipment today.  The effects of anesthesia are still present, drowsiness may result.  Rest today, not necessarily flat bed rest, just take it easy.  You may resume your normal activity in one to two days or as indicated by your physician.  Sexual Activity:  Erection and sexual relations should be avoided for *2 weeks.  Return to Work:    Diet:  Drink liquids or eat a very light diet this evening.  You may resume a regular diet tomorrow.  General Expectations of your surgery:   You may have a small amount of bleeding  The penis will be swollen and bruised for approximately one week  You may wake during the night with an erection, usually this is caused by having a full bladder so you should  try to urinate (pass your water) to relieve the erection or apply ice to the penis  Unexpected Observations - Call your doctor if these occur!  Persistent or heavy bleeding  Temperature of 101 degrees or more  Severe pain not relieved by medication  Return to Office  .  Call to schedule your appointment   Additional Instructions:  Antibiotic ointment to glans ( head of penis 2x/day.   Patient's Signature:__________________________________________________  31 Signature:___________________ Nurse's Signature_______________  [facility] [phone] [address], [place], [place] [address] Tel: [phone]

## 2016-09-17 NOTE — Discharge Summary (Signed)
Physician Discharge Summary  Patient ID: [name] MRN: [id number] DOB/AGE: [birth year] 76 y.o.  Admit date: 09/16/2016 Discharge date: 09/17/2016  Admission Diagnoses: [place] OUTLET OBSTRUCTION  Discharge Diagnoses:  Active Problems:   Bladder outlet obstruction   Penile cancer   Alzheimer's Disease   Diabetes Discharged Condition: Stable  Hospital Course:  Circumcision, Thulium laser TURP  Significant Diagnostic Studies: No results found.  Discharge Exam: Blood pressure 126/77, pulse 82, temperature 97.7 F (36.5 C), temperature source Oral, resp. rate 17, height 5\' 10"  (1.778 m), weight 108.4 kg (239 lb), SpO2 93 %.   Disposition: 01-Home or Self Care  Discharge Instructions    Care order/instruction:    Complete by:  As directed    Discontinue IV    Complete by:  As directed    Foley catheter - discontinue    Complete by:  As directed      Allergies as of 09/17/2016      Reactions   Fentanyl Other (See Comments)   Delirium   Hydromorphone Hcl Other (See Comments)   Delirium   Lorazepam Other (See Comments)   Delirium   Oxycodone-acetaminophen Other (See Comments)   Delirium   Lisinopril    Vicodin [hydrocodone-acetaminophen]       Medication List    STOP taking these medications   diltiazem 240 MG 24 hr capsule Commonly known as:  CARDIZEM CD     TAKE these medications   albuterol 108 (90 Base) MCG/ACT inhaler Commonly known as:  PROVENTIL HFA;VENTOLIN HFA Inhale 2 puffs into the lungs every 6 (six) hours as needed for wheezing or shortness of breath. Reported on 06/18/2015   aspirin EC 81 MG tablet Take 81 mg by mouth daily.   CENTRUM SILVER tablet Take 1 tablet by mouth daily.   donepezil 10 MG tablet Commonly known as:  ARICEPT Take 1 tablet (10 mg total) by mouth at bedtime.   fenofibrate micronized 134 MG capsule Commonly known as:  LOFIBRA Take 134 mg by mouth daily before breakfast.   Fish Oil 1200 MG  Caps Take 1,200 mg by mouth daily.   fluocinonide cream 0.05 % Commonly known as:  LIDEX Apply 1 application topically daily as needed.   Hyoscyamine Sulfate SL 0.125 MG Subl Commonly known as:  LEVSIN/SL Place 0.125 mg under the tongue 2 (two) times daily as needed.   insulin NPH Human 100 UNIT/ML injection Commonly known as:  HUMULIN N Inject 0.75 mLs (75 Units total) into the skin every morning.   Iron 325 (65 Fe) MG Tabs Take 1 tablet by mouth daily.   memantine 5 MG tablet Commonly known as:  NAMENDA Take 10 mg by mouth 2 (two) times daily.   metoprolol tartrate 25 MG tablet Commonly known as:  LOPRESSOR Take 25 mg by mouth 2 (two) times daily.   neomycin-bacitracin-polymyxin ointment Commonly known as:  NEOSPORIN Apply 1 application topically 2 (two) times daily. Apply to penile meatus 2x/day   nitroGLYCERIN 0.4 MG SL tablet Commonly known as:  NITROSTAT Place 1 tablet (0.4 mg total) under the tongue every 5 (five) minutes as needed for chest pain.   pantoprazole 40 MG tablet Commonly known as:  PROTONIX Take 40 mg by mouth daily.   PARoxetine 40 MG tablet Commonly known as:  PAXIL Take by mouth daily.   rosuvastatin 20 MG tablet Commonly known as:  CRESTOR Take 20 mg by mouth at bedtime.      Follow-up Information  Carolan Clines, MD.   Specialty:  Urology Why:  for  NP f/u Contact information: [address] [place] [address] [phone]          Antibiotic ointment to glans 2x/day RTC for f/u Signed: Zonnique [name] I Ameya Kutz 09/17/2016, 8:20 AM

## 2016-09-17 NOTE — Progress Notes (Signed)
Urology Progress Note  1 Day Post-Op   Subjective: Post circuncision for preputial cancer Rx, and thulium laser TURP.   No acute urologic events overnight.Pt inappropriate ( Al;zheimer's), but not combative. Pt medicated for pain, but did not pull on foley. Was not combative.  Ambulation:   negative Flatus:    positive Bowel movement  negative  Pain: some relief  Objective:  Blood pressure 126/77, pulse 82, temperature 97.7 F (36.5 C), temperature source Oral, resp. rate 17, height 5\' 10"  (1.778 m), weight 108.4 kg (239 lb), SpO2 93 %.  Physical Exam:  General:  No acute distress, awake Extremities: extremities normal, atraumatic, no cyanosis or edema Genitourinary:  Penile glans: original dressing off/ 2ndary dressing removed: glans pink, tender.  Foley: Urine clear this AM. No clots.     I/O last 3 completed shifts: In: 2288.8 [P.O.:420; I.V.:1768.8; IV Piggyback:100] Out: 785 [Urine:585; Stool:175; Blood:25]  No results for input(s): HGB, WBC, PLT in the last 72 hours.  No results for input(s): NA, K, CL, CO2, BUN, CREATININE, CALCIUM, GFRNONAA, GFRAA in the last 72 hours.  Invalid input(s): MAGNESIUM   No results for input(s): INR, APTT in the last 72 hours.  Invalid input(s): PT   Invalid input(s): ABG  Assessment/Plan: : Pt is doing very well. His urine is clear. In view of his memory problems, and clear urine, I will go ahead and remove hid foley, rather than take a chance that he will pull his catheter out at home. He is ready for d/c.  He will need home dressing for his glans. Antibiotic ointment.

## 2016-09-17 NOTE — Progress Notes (Signed)
Patient still has not voided after foley removal.  Bladder scan performed and only shows 111 ml.  Fluids encouraged.  Will continue to monitor. [name] A

## 2016-09-23 DIAGNOSIS — N481 Balanitis: Secondary | ICD-10-CM | POA: Diagnosis not present

## 2016-09-23 DIAGNOSIS — N401 Enlarged prostate with lower urinary tract symptoms: Secondary | ICD-10-CM | POA: Diagnosis not present

## 2016-09-27 DIAGNOSIS — N320 Bladder-neck obstruction: Secondary | ICD-10-CM | POA: Diagnosis not present

## 2016-09-27 DIAGNOSIS — I10 Essential (primary) hypertension: Secondary | ICD-10-CM | POA: Diagnosis not present

## 2016-09-27 DIAGNOSIS — Z09 Encounter for follow-up examination after completed treatment for conditions other than malignant neoplasm: Secondary | ICD-10-CM | POA: Diagnosis not present

## 2016-09-29 DIAGNOSIS — R627 Adult failure to thrive: Secondary | ICD-10-CM | POA: Diagnosis not present

## 2016-09-29 DIAGNOSIS — E1121 Type 2 diabetes mellitus with diabetic nephropathy: Secondary | ICD-10-CM | POA: Diagnosis not present

## 2016-09-29 DIAGNOSIS — Z933 Colostomy status: Secondary | ICD-10-CM | POA: Diagnosis not present

## 2016-09-29 DIAGNOSIS — R278 Other lack of coordination: Secondary | ICD-10-CM | POA: Diagnosis not present

## 2016-09-29 DIAGNOSIS — A070 Balantidiasis: Secondary | ICD-10-CM | POA: Diagnosis not present

## 2016-09-29 DIAGNOSIS — E1129 Type 2 diabetes mellitus with other diabetic kidney complication: Secondary | ICD-10-CM | POA: Diagnosis not present

## 2016-09-29 DIAGNOSIS — R296 Repeated falls: Secondary | ICD-10-CM | POA: Diagnosis not present

## 2016-09-29 DIAGNOSIS — R5381 Other malaise: Secondary | ICD-10-CM | POA: Diagnosis not present

## 2016-09-29 DIAGNOSIS — N399 Disorder of urinary system, unspecified: Secondary | ICD-10-CM | POA: Diagnosis not present

## 2016-09-29 DIAGNOSIS — G301 Alzheimer's disease with late onset: Secondary | ICD-10-CM | POA: Diagnosis not present

## 2016-09-29 DIAGNOSIS — M6281 Muscle weakness (generalized): Secondary | ICD-10-CM | POA: Diagnosis not present

## 2016-09-29 DIAGNOSIS — R2681 Unsteadiness on feet: Secondary | ICD-10-CM | POA: Diagnosis not present

## 2016-10-03 DIAGNOSIS — G301 Alzheimer's disease with late onset: Secondary | ICD-10-CM | POA: Diagnosis not present

## 2016-10-03 DIAGNOSIS — R627 Adult failure to thrive: Secondary | ICD-10-CM | POA: Diagnosis not present

## 2016-10-03 DIAGNOSIS — Z933 Colostomy status: Secondary | ICD-10-CM | POA: Diagnosis not present

## 2016-10-03 DIAGNOSIS — A070 Balantidiasis: Secondary | ICD-10-CM | POA: Diagnosis not present

## 2016-10-03 DIAGNOSIS — E1121 Type 2 diabetes mellitus with diabetic nephropathy: Secondary | ICD-10-CM | POA: Diagnosis not present

## 2016-10-05 ENCOUNTER — Telehealth: Payer: Self-pay | Admitting: Endocrinology

## 2016-10-05 NOTE — Telephone Encounter (Signed)
Pt's wife cancelled 07/25 appointment. Pt is in nursing home .

## 2016-10-05 NOTE — Telephone Encounter (Signed)
See message to be advised.  

## 2016-10-10 DIAGNOSIS — I10 Essential (primary) hypertension: Secondary | ICD-10-CM | POA: Diagnosis not present

## 2016-10-10 DIAGNOSIS — G301 Alzheimer's disease with late onset: Secondary | ICD-10-CM | POA: Diagnosis not present

## 2016-10-10 DIAGNOSIS — N481 Balanitis: Secondary | ICD-10-CM | POA: Diagnosis not present

## 2016-10-13 ENCOUNTER — Encounter: Payer: Self-pay | Admitting: Adult Health

## 2016-10-13 ENCOUNTER — Ambulatory Visit (INDEPENDENT_AMBULATORY_CARE_PROVIDER_SITE_OTHER): Payer: Medicare Other | Admitting: Adult Health

## 2016-10-13 VITALS — BP 109/66 | HR 62

## 2016-10-13 DIAGNOSIS — F0391 Unspecified dementia with behavioral disturbance: Secondary | ICD-10-CM | POA: Diagnosis not present

## 2016-10-13 NOTE — Progress Notes (Signed)
I have read the note, and I agree with the clinical assessment and plan.  Antonius Hartlage,Hari KEITH   

## 2016-10-13 NOTE — Progress Notes (Signed)
PATIENT: [name] DOB: May 02, 1940  REASON FOR VISIT: follow up- memory disturbance HISTORY FROM: patient  HISTORY OF PRESENT ILLNESS: [name] is a 76 year old male with a history of memory disturbance. He returns today for follow-up. He is here with his wife. The [facility] is now in [facility]. He recently had a circumcision and TURP procedure. His wife states that his stay there may be long-term. She states that she was unable to get him to do things at home. He was also falling in the house. She states that his confusion has gotten worse. She states this started before the surgeries. The patient does require assistance with all ADLs. She reports that there are some issues with agitation and aggressiveness. His primary care [name] placed him on Depakote. She states that he is more cooperative with the nursing assistance at the facility. She is unsure if he should remain on Aricept and Namenda. She states that [name] recommended stopping Namenda although she noticed that the facility was still giving it to him. He returns today for evaluation.  HISTORY 04/07/2016: [name] is a 76 year old male with a history of mild gait disorder and memory disturbance. He returns today for follow-up. The patient is currently on Aricept and Namenda and tolerating both well. The patient and his wife feel that his memory has remained stable. He is back at home now. He still requires assistance with most all ADLs. He does not operate a motor vehicle. His wife completes all finances and does all the cooking. Denies any trouble sleeping. Denies any changes with his behavior. Denies agitation. The wife reports that if he does become agitated she normally can distract him. The patient uses a wheelchair for longer distances but a cane around his home. Denies any falls. Patient returns today for an evaluation.  REVIEW OF SYSTEMS: Out of a complete 14 system review of symptoms, the patient  complains only of the following symptoms, and all other reviewed systems are negative.  Joint pain, aching muscles, walking difficulty, neck pain, agitation, behavior problem, depression, memory loss, bleed easily, cold intolerance, hearing loss, chest pain  ALLERGIES: Allergies  Allergen Reactions  . Fentanyl Other (See Comments)    Delirium  . Hydromorphone Hcl Other (See Comments)    Delirium  . Lorazepam Other (See Comments)    Delirium  . Oxycodone-Acetaminophen Other (See Comments)    Delirium  . Lisinopril   . Vicodin [Hydrocodone-Acetaminophen]     HOME MEDICATIONS: Outpatient Medications Prior to Visit  Medication Sig Dispense Refill  . albuterol (PROVENTIL HFA;VENTOLIN HFA) 108 (90 BASE) MCG/ACT inhaler Inhale 2 puffs into the lungs every 6 (six) hours as needed for wheezing or shortness of breath. Reported on 06/18/2015    . aspirin EC 81 MG tablet Take 81 mg by mouth daily.    Marland Kitchen donepezil (ARICEPT) 10 MG tablet Take 1 tablet (10 mg total) by mouth at bedtime. 90 tablet 2  . fenofibrate micronized (LOFIBRA) 134 MG capsule Take 134 mg by mouth daily before breakfast.    . Ferrous Sulfate (IRON) 325 (65 Fe) MG TABS Take 1 tablet by mouth daily.    . fluocinonide cream (LIDEX) 9.67 % Apply 1 application topically daily as needed.     Marland Kitchen Hyoscyamine Sulfate SL (LEVSIN/SL) 0.125 MG SUBL Place 0.125 mg under the tongue 2 (two) times daily as needed. 60 each 0  . insulin NPH Human (HUMULIN N) 100 UNIT/ML injection Inject 0.75 mLs (75 Units total)  into the skin every morning. 70 mL 1  . memantine (NAMENDA) 5 MG tablet Take 10 mg by mouth 2 (two) times daily.    . metoprolol tartrate (LOPRESSOR) 25 MG tablet Take 25 mg by mouth 2 (two) times daily.    . Multiple Vitamins-Minerals (CENTRUM SILVER) tablet Take 1 tablet by mouth daily.    Marland Kitchen neomycin-bacitracin-polymyxin (NEOSPORIN) ointment Apply 1 application topically 2 (two) times daily. Apply to penile meatus 2x/day 15 g 0  .  nitroGLYCERIN (NITROSTAT) 0.4 MG SL tablet Place 1 tablet (0.4 mg total) under the tongue every 5 (five) minutes as needed for chest pain. 25 tablet 3  . Omega-3 Fatty Acids (FISH OIL) 1200 MG CAPS Take 1,200 mg by mouth daily.     . pantoprazole (PROTONIX) 40 MG tablet Take 40 mg by mouth daily.     . rosuvastatin (CRESTOR) 20 MG tablet Take 20 mg by mouth at bedtime.     Marland Kitchen PARoxetine (PAXIL) 40 MG tablet Take by mouth daily.      No facility-administered medications prior to visit.     PAST MEDICAL HISTORY: Past Medical History:  Diagnosis Date  . AAA (abdominal aortic aneurysm) ([place]) 1992   S/p repair and grafting  . Alzheimer's dementia dx'd ~ 04/2016  . Anxiety   . Asthma    hx of  . Benign prostatic hypertrophy    S/p TURP   . Bladder cancer ([place]) 05/2000   Transitional Cell  . CAD (coronary artery disease)    a. patient's wife reports no prior stenting  . Chronic bronchitis ([place])    "used to get it quite often; found out medicine was causing it"  . Chronic systolic CHF (congestive heart failure) (HCC)    EF 40-45% by echo 06/2011  . Colitis, ulcerative ([place])    NOS  . Colon cancer ([place]) 2007  . Complication of anesthesia    slow to wake up  . Depression   . Frequent falls   . GERD (gastroesophageal reflux disease)   . Gout   . H/O ETOH abuse 06/06/2011  . History of blood transfusion 2007   "related to colon OR"  . History of hiatal hernia   . Hypercholesteremia    IIA  . Ileostomy present ([place]) 01/06/2006  . Normocytic anemia    Chronic  . Obesity   . Osteoarthritis    "knees" (07/07/2016)  . PAF (paroxysmal atrial fibrillation) (HCC)    a. during hospitalization 06/2011 for sepsis; b. not on long term, full-dose anticoagulation; c. CHADS2VASc 8 (CHF, HTN, age x 2, DM, stroke x 2, vascular dz)  . Personal hx-rectal/anal malignancy   . Pleurisy 06/2011  . Sleep apnea    "no problem since ~ 2014 when he quit drinking" (07/07/2016)  . Small bowel obstruction ([place])  09/04/2015  . Stones in the urinary tract   . Stroke [facility]) 1972   "light one", spouse denies residual on 07/07/2016  . Type II diabetes mellitus ([place]) dx'd 2001  . Uncontrolled secondary diabetes mellitus with stage 3 CKD (GFR 30-59) ([place]) 06/05/2011  . Unspecified essential hypertension   . Urinary incontinence   . Vascular dementia     PAST SURGICAL HISTORY: Past Surgical History:  Procedure Laterality Date  . ABDOMINAL AORTIC ANEURYSM REPAIR  01/03/1991   "had 3 blockages; 3 aneurysms repaired at the same time"  . ANKLE FRACTURE SURGERY  08/17/2003   compound fracture  . APPENDECTOMY    . CATARACT EXTRACTION W/ INTRAOCULAR LENS  IMPLANT, BILATERAL Bilateral ~ 2010  . CIRCUMCISION N/A 09/16/2016   Procedure: CIRCUMCISION ADULT;  Surgeon: Carolan Clines, MD;  Location: [facility];  Service: Urology;  Laterality: N/A;  . COLECTOMY  01/06/2006   "removed colon and rectum"  . COLONOSCOPY W/ POLYPECTOMY    . CORONARY ANGIOPLASTY  12/1990  . CYSTOSCOPY W/ STONE MANIPULATION  09/1961  . EXCISIONAL TOTAL KNEE ARTHROPLASTY WITH ANTIBIOTIC SPACERS Left 08/24/2011  . EYE SURGERY    . FRACTURE SURGERY    . I&D KNEE WITH POLY EXCHANGE  06/12/2011   Procedure: IRRIGATION AND DEBRIDEMENT KNEE WITH POLY EXCHANGE;  Surgeon: Yvette Rack., MD;  Location: [facility];  Service: Orthopedics;  Laterality: Left;  . JOINT REPLACEMENT    . KNEE ARTHROSCOPY Left 07/29/1999  . THULIUM LASER TURP (TRANSURETHRAL RESECTION OF PROSTATE) N/A 09/16/2016   Procedure: THULIUM LASER TURP (TRANSURETHRAL RESECTION OF PROSTATE);  Surgeon: Carolan Clines, MD;  Location: [facility];  Service: Urology;  Laterality: N/A;  . TOTAL KNEE ARTHROPLASTY Left 01/07/2008  . TOTAL KNEE REVISION  10/26/2011   Procedure: TOTAL KNEE REVISION;  Surgeon: Garald Balding, MD;  Location: [facility];  Service: Orthopedics;  Laterality: Left;  left total knee revision  . TRANSURETHRAL RESECTION OF BLADDER TUMOR WITH GYRUS (TURBT-GYRUS)   01/13/2000; 05/2000   ; "w/chemo wash, etc."  . TRANSURETHRAL RESECTION OF PROSTATE  04/22/2006    FAMILY HISTORY: Family History  Problem Relation Age of Onset  . Dementia Mother   . COPD Father   . Dementia Sister   . Cancer Brother   . Cancer Brother   . Dementia Brother   . Anesthesia problems Neg Hx   . Diabetes Neg Hx     SOCIAL HISTORY: Social History   Social History  . Marital status: Married    Spouse name: N/A  . Number of children: 3  . Years of education: hs   Occupational History  . Retired    Social History Main Topics  . Smoking status: Former Smoker    Packs/day: 2.00    Years: 12.00    Types: Cigarettes    Quit date: 06/04/1970  . Smokeless tobacco: Former Systems developer    Types: Chew    Quit date: 04/05/2016  . Alcohol use No     Comment: 07/07/2016 "h/o heavy drinking; quit ~ 2014"  . Drug use: No  . Sexual activity: No   Other Topics Concern  . Not on file   Social History Narrative   Married. Patient lives at home with his wife.   Patient is right handed.   Patient drinks 3 cups of caffeine per day.      PHYSICAL EXAM  Vitals:   10/13/16 1122  BP: 109/66  Pulse: 62   There is no height or weight on file to calculate BMI.   MMSE - Mini Mental State Exam 10/13/2016 04/07/2016 01/30/2015  Orientation to time 0 0 4  Orientation to Place 0 3 5  Registration 0 3 3  Attention/ Calculation 2 2 2   Recall 0 2 2  Language- name 2 objects 2 2 2   Language- repeat 0 1 1  Language- follow 3 step command 2 3 3   Language- read & follow direction 1 1 1   Write a sentence 1 1 1   Copy design 0 0 1  Total score 8 18 25      Generalized: Well developed, in no acute distress   Neurological examination  Mentation: Alert . Follows all commands  speech and language fluent Cranial nerve II-XII: Pupils were equal round reactive to light. Extraocular movements were full, visual field were full on confrontational test. Facial sensation and strength were normal.  Uvula tongue midline. Head turning and shoulder shrug  were normal and symmetric. Motor: The motor testing reveals 5 over 5 strength of all 4 extremities. Good symmetric motor tone is noted throughout.  Sensory: Sensory testing is intact to soft touch on all 4 extremities. No evidence of extinction is noted.  Coordination: Cerebellar testing reveals good finger-nose-finger and heel-to-shin bilaterally.  Gait and station: Patient is in a wheelchair   DIAGNOSTIC DATA (LABS, IMAGING, TESTING) - I reviewed patient records, labs, notes, testing and imaging myself where available.  Lab Results  Component Value Date   WBC 9.1 09/13/2016   HGB 14.7 09/13/2016   HCT 42.5 09/13/2016   MCV 89.5 09/13/2016   PLT 167 09/13/2016      Component Value Date/Time   NA 135 09/13/2016 1441   NA 133 (L) 02/05/2014 1043   K 5.4 (H) 09/13/2016 1441   K 4.9 02/05/2014 1043   CL 106 09/13/2016 1441   CO2 21 (L) 09/13/2016 1441   CO2 21 (L) 02/05/2014 1043   GLUCOSE 320 (H) 09/13/2016 1441   GLUCOSE 476 (H) 02/05/2014 1043   BUN 40 (H) 09/13/2016 1441   BUN 26.9 (H) 02/05/2014 1043   CREATININE 2.29 (H) 09/13/2016 1441   CREATININE 2.5 (H) 02/05/2014 1043   CALCIUM 9.6 09/13/2016 1441   CALCIUM 9.7 02/05/2014 1043   PROT 7.3 09/13/2016 1441   PROT 6.8 02/05/2014 1043   ALBUMIN 4.3 09/13/2016 1441   ALBUMIN 4.0 02/05/2014 1043   AST 30 09/13/2016 1441   AST 22 02/05/2014 1043   ALT 31 09/13/2016 1441   ALT 22 02/05/2014 1043   ALKPHOS 73 09/13/2016 1441   ALKPHOS 53 02/05/2014 1043   BILITOT 0.6 09/13/2016 1441   BILITOT 0.49 02/05/2014 1043   GFRNONAA 26 (L) 09/13/2016 1441   GFRAA 30 (L) 09/13/2016 1441   Lab Results  Component Value Date   CHOL 132 07/07/2016   HDL 31 (L) 07/07/2016   LDLCALC 28 07/07/2016   TRIG 363 (H) 07/07/2016   CHOLHDL 4.3 07/07/2016   Lab Results  Component Value Date   HGBA1C 8.0 07/28/2016   Lab Results  Component Value Date   VITAMINB12 388  04/23/2013   Lab Results  Component Value Date   TSH 1.851 09/08/2015      ASSESSMENT AND PLAN 76 y.o. year old male  has a past medical history of AAA (abdominal aortic aneurysm) ([place]) (1992); Alzheimer's dementia (dx'd ~ 04/2016); Anxiety; Asthma; Benign prostatic hypertrophy; Bladder cancer ([place]) (05/2000); CAD (coronary artery disease); Chronic bronchitis ([place]); Chronic systolic CHF (congestive heart failure) ([place]); Colitis, ulcerative ([place]); Colon cancer ([place]) (2007); Complication of anesthesia; Depression; Frequent falls; GERD (gastroesophageal reflux disease); Gout; H/O ETOH abuse (06/06/2011); History of blood transfusion (2007); History of hiatal hernia; Hypercholesteremia; Ileostomy present ([place]) (01/06/2006); Normocytic anemia; Obesity; Osteoarthritis; PAF (paroxysmal atrial fibrillation) ([place]); Personal hx-rectal/anal malignancy; Pleurisy (06/2011); Sleep apnea; Small bowel obstruction ([place]) (09/04/2015); Stones in the urinary tract; Stroke ([place]) (1972); Type II diabetes mellitus ([place]) (dx'd 2001); 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 here with:  1. Dementia  The patient's memory score has declined since the last visit. He is currently residing in nursing facility. The patient's wife would like to discontinue  Namenda she does not see the benefit with his current memory score. I am amenable to this plan. In the future we may also consider discontinuing Aricept. According to the wife the patient will possibly remain at the facility long-term. I have advised that if symptoms worsen or he develops new symptoms they should let us know. He will follow up with our office in 6 months or sooner if needed.  I spent 15 minutes with the patient. 50% of this time was spent discussing memory score and treatment    Ward Givens, MSN, NP-C 10/13/2016, 11:41 AM [facility] [address], [place], [place] [address] [phone]

## 2016-10-13 NOTE — Patient Instructions (Signed)
Your Plan:  Continue Aricept for now- May consider stopping this in the future Stop Namenda  If your symptoms worsen or you develop new symptoms please let us know.    Thank you for coming to see [place] at [facility]. I hope we have been able to provide you high quality care today.  You may receive a patient satisfaction survey over the next few weeks. We would appreciate your feedback and comments so that we may continue to improve ourselves and the health of our patients.

## 2016-10-17 DIAGNOSIS — G308 Other Alzheimer's disease: Secondary | ICD-10-CM | POA: Diagnosis not present

## 2016-10-27 ENCOUNTER — Ambulatory Visit: Payer: Medicare Other | Admitting: Endocrinology

## 2016-11-16 ENCOUNTER — Other Ambulatory Visit: Payer: Self-pay | Admitting: *Deleted

## 2016-11-16 NOTE — Patient Outreach (Signed)
   [facility]) Care Management Post-Acute Care Coordination  11/16/2016  [name] December 08, 1940 [id number]   Met with Benjamine Mola, SW for [place]. Reviewed patient case. She confirms that patient is a [place] resident of facility and there are no plans to discharge at this time.  RNCM will sign off case.  Royetta Crochet. Laymond Purser, RN, BSN, [place] Post-Acute Care Coordinator [phone]

## 2016-12-02 DIAGNOSIS — Z933 Colostomy status: Secondary | ICD-10-CM | POA: Diagnosis not present

## 2016-12-02 DIAGNOSIS — Z932 Ileostomy status: Secondary | ICD-10-CM | POA: Diagnosis not present

## 2016-12-02 DIAGNOSIS — K56609 Unspecified intestinal obstruction, unspecified as to partial versus complete obstruction: Secondary | ICD-10-CM | POA: Diagnosis not present

## 2016-12-02 DIAGNOSIS — E119 Type 2 diabetes mellitus without complications: Secondary | ICD-10-CM | POA: Diagnosis not present

## 2016-12-06 DIAGNOSIS — M6281 Muscle weakness (generalized): Secondary | ICD-10-CM | POA: Diagnosis not present

## 2016-12-12 DIAGNOSIS — G300 Alzheimer's disease with early onset: Secondary | ICD-10-CM | POA: Diagnosis not present

## 2016-12-12 DIAGNOSIS — R400 Somnolence: Secondary | ICD-10-CM | POA: Diagnosis not present

## 2016-12-12 DIAGNOSIS — E119 Type 2 diabetes mellitus without complications: Secondary | ICD-10-CM | POA: Diagnosis not present

## 2017-01-09 DIAGNOSIS — E119 Type 2 diabetes mellitus without complications: Secondary | ICD-10-CM | POA: Diagnosis not present

## 2017-01-09 DIAGNOSIS — G309 Alzheimer's disease, unspecified: Secondary | ICD-10-CM | POA: Diagnosis not present

## 2017-01-09 DIAGNOSIS — I10 Essential (primary) hypertension: Secondary | ICD-10-CM | POA: Diagnosis not present

## 2017-01-09 DIAGNOSIS — R251 Tremor, unspecified: Secondary | ICD-10-CM | POA: Diagnosis not present

## 2017-01-13 DIAGNOSIS — R531 Weakness: Secondary | ICD-10-CM | POA: Diagnosis not present

## 2017-02-03 DEATH — deceased

## 2017-04-20 ENCOUNTER — Ambulatory Visit: Payer: Medicare Other | Admitting: Adult Health

## 2017-11-24 IMAGING — DX DG ABDOMEN 1V
1 series · 1 of 1 positions shown · non-contrast
Comparison: 09/05/2015 at 6191 hours

CLINICAL DATA: NG tube placement

EXAM:
ABDOMEN - 1 VIEW

[abdomen kub]
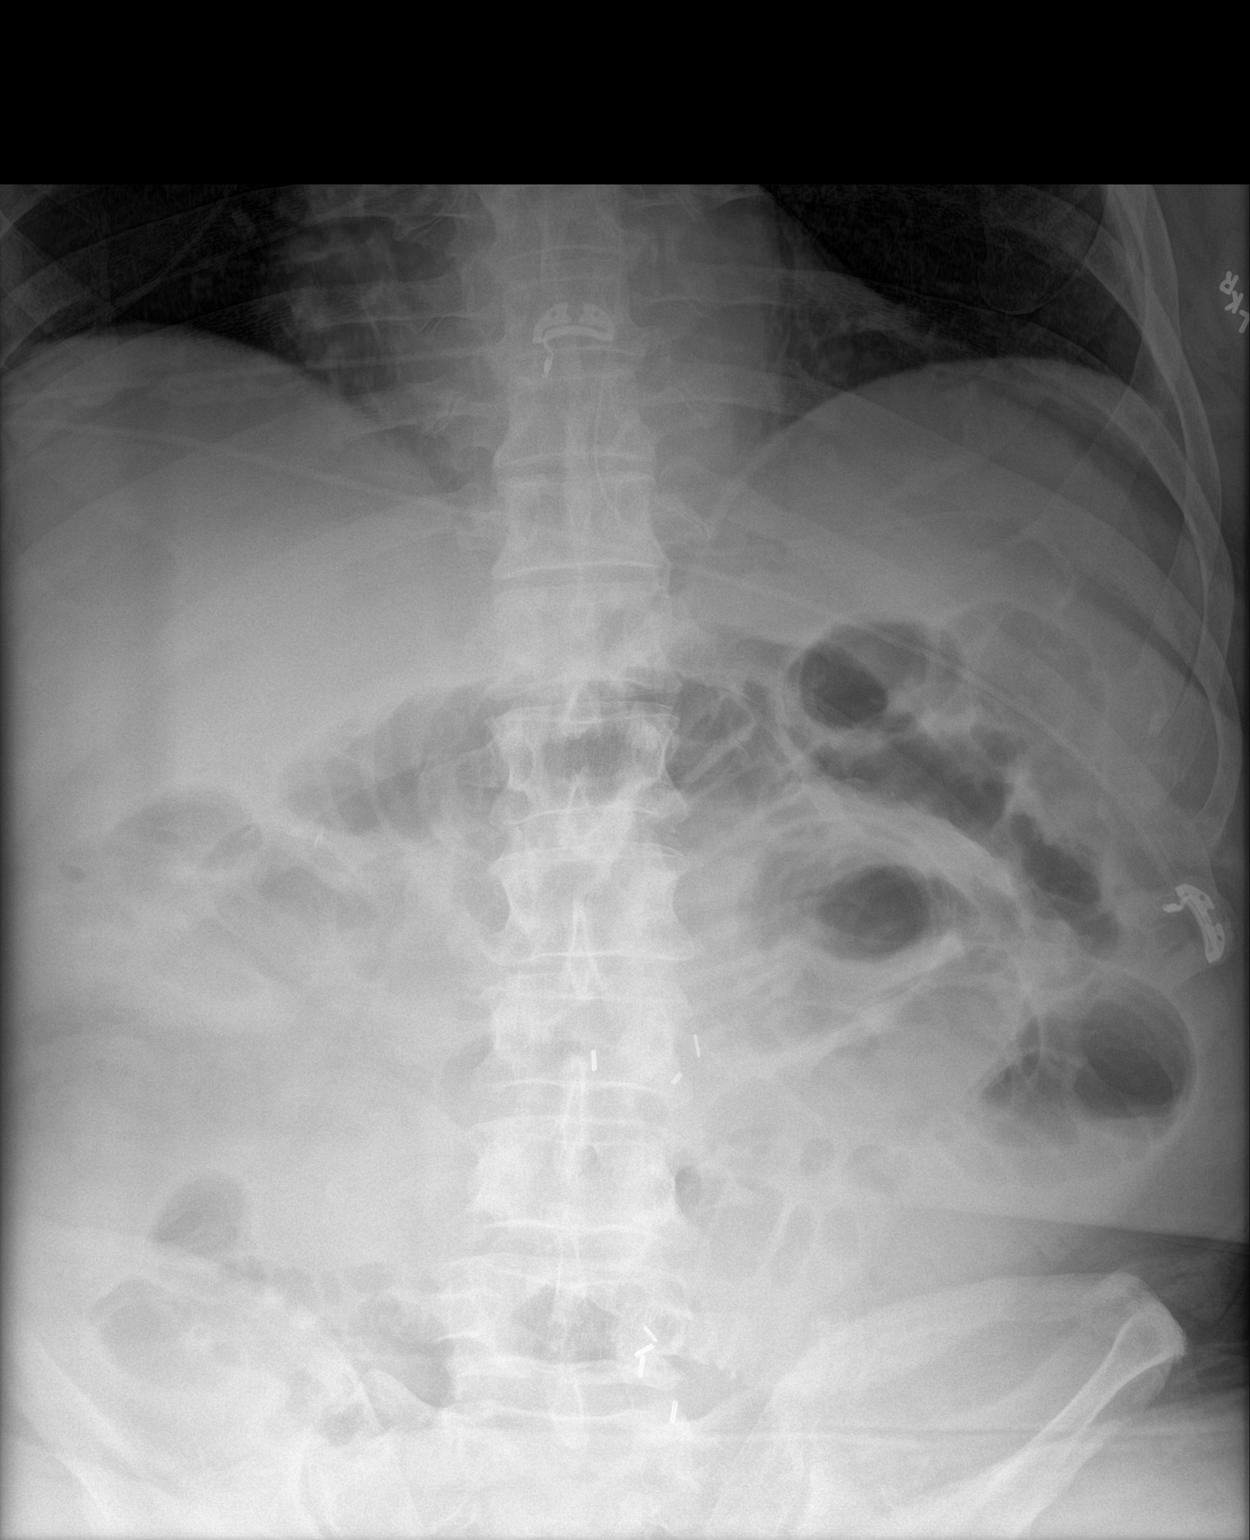

[1 of 1 positions shown; findings below may reference images not displayed]

FINDINGS: Enteric tube terminates in distal esophagus, just above the GE
junction.

Multiple mildly prominent/dilated loops of small bowel in the
central abdomen, worrisome for possible small bowel obstruction.

Surgical clips in the mid abdomen.

Mild degenerative changes of the lumbar spine.
IMPRESSION: Enteric tube terminates in the distal esophagus, just above the GE
junction. Advancement is suggested.

## 2017-11-25 IMAGING — CR DG ABDOMEN 2V
3 series · 3 of 3 positions shown · non-contrast
Comparison: 09/05/2015

CLINICAL DATA: Small bowel obstruction.

EXAM:
ABDOMEN - 2 VIEW

[abdomen erect]
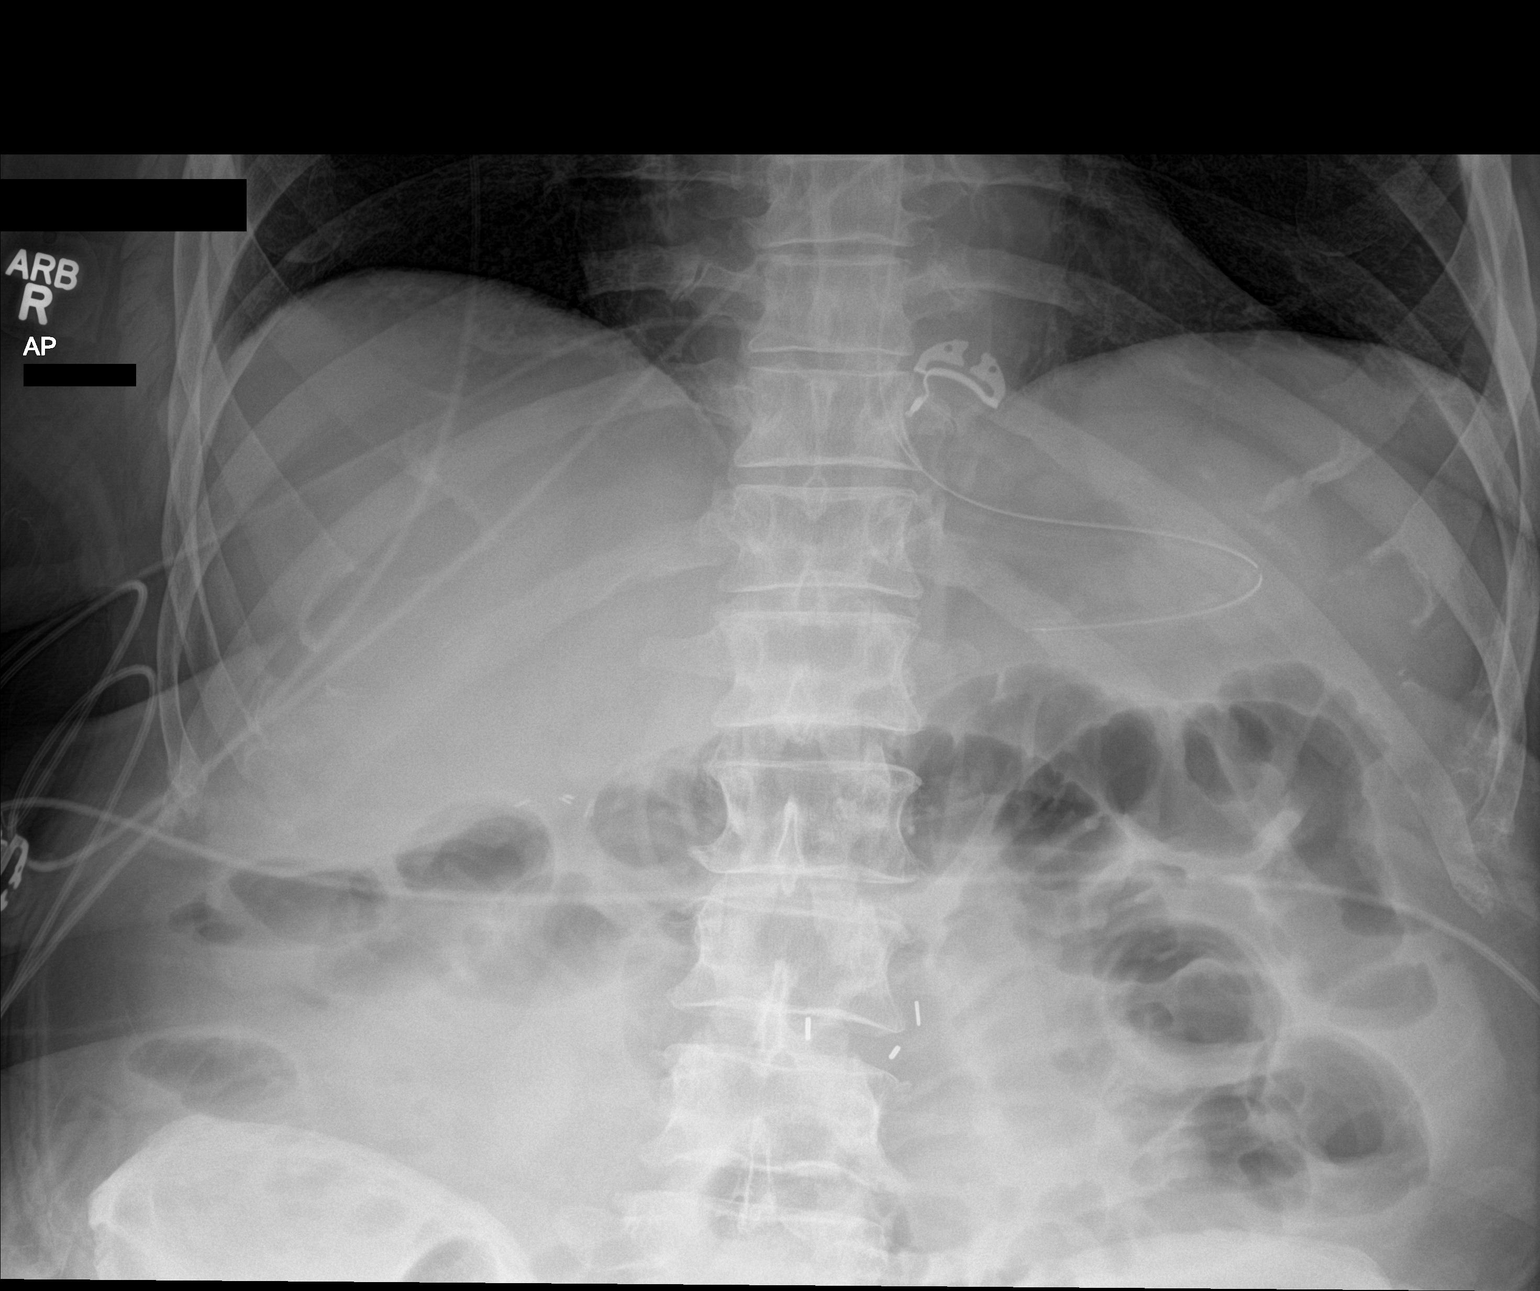

[abdomen supine (1 of 2)]
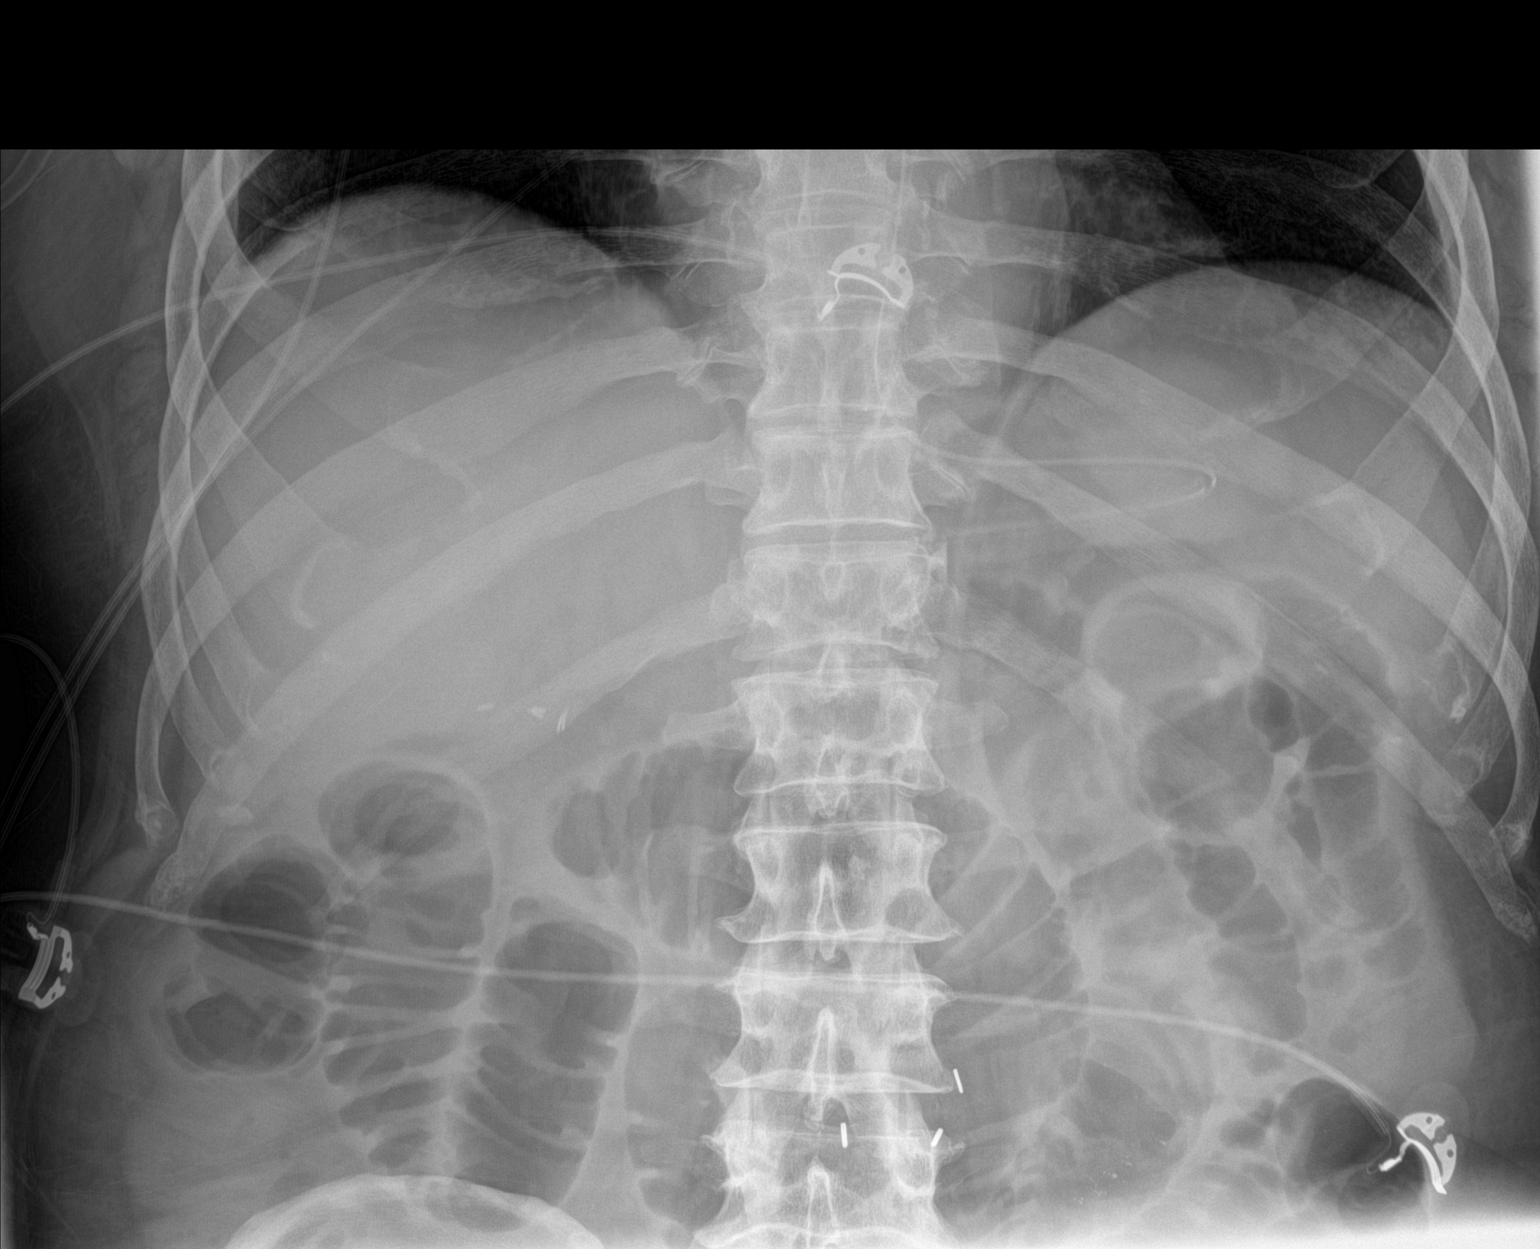

[abdomen supine (2 of 2)]
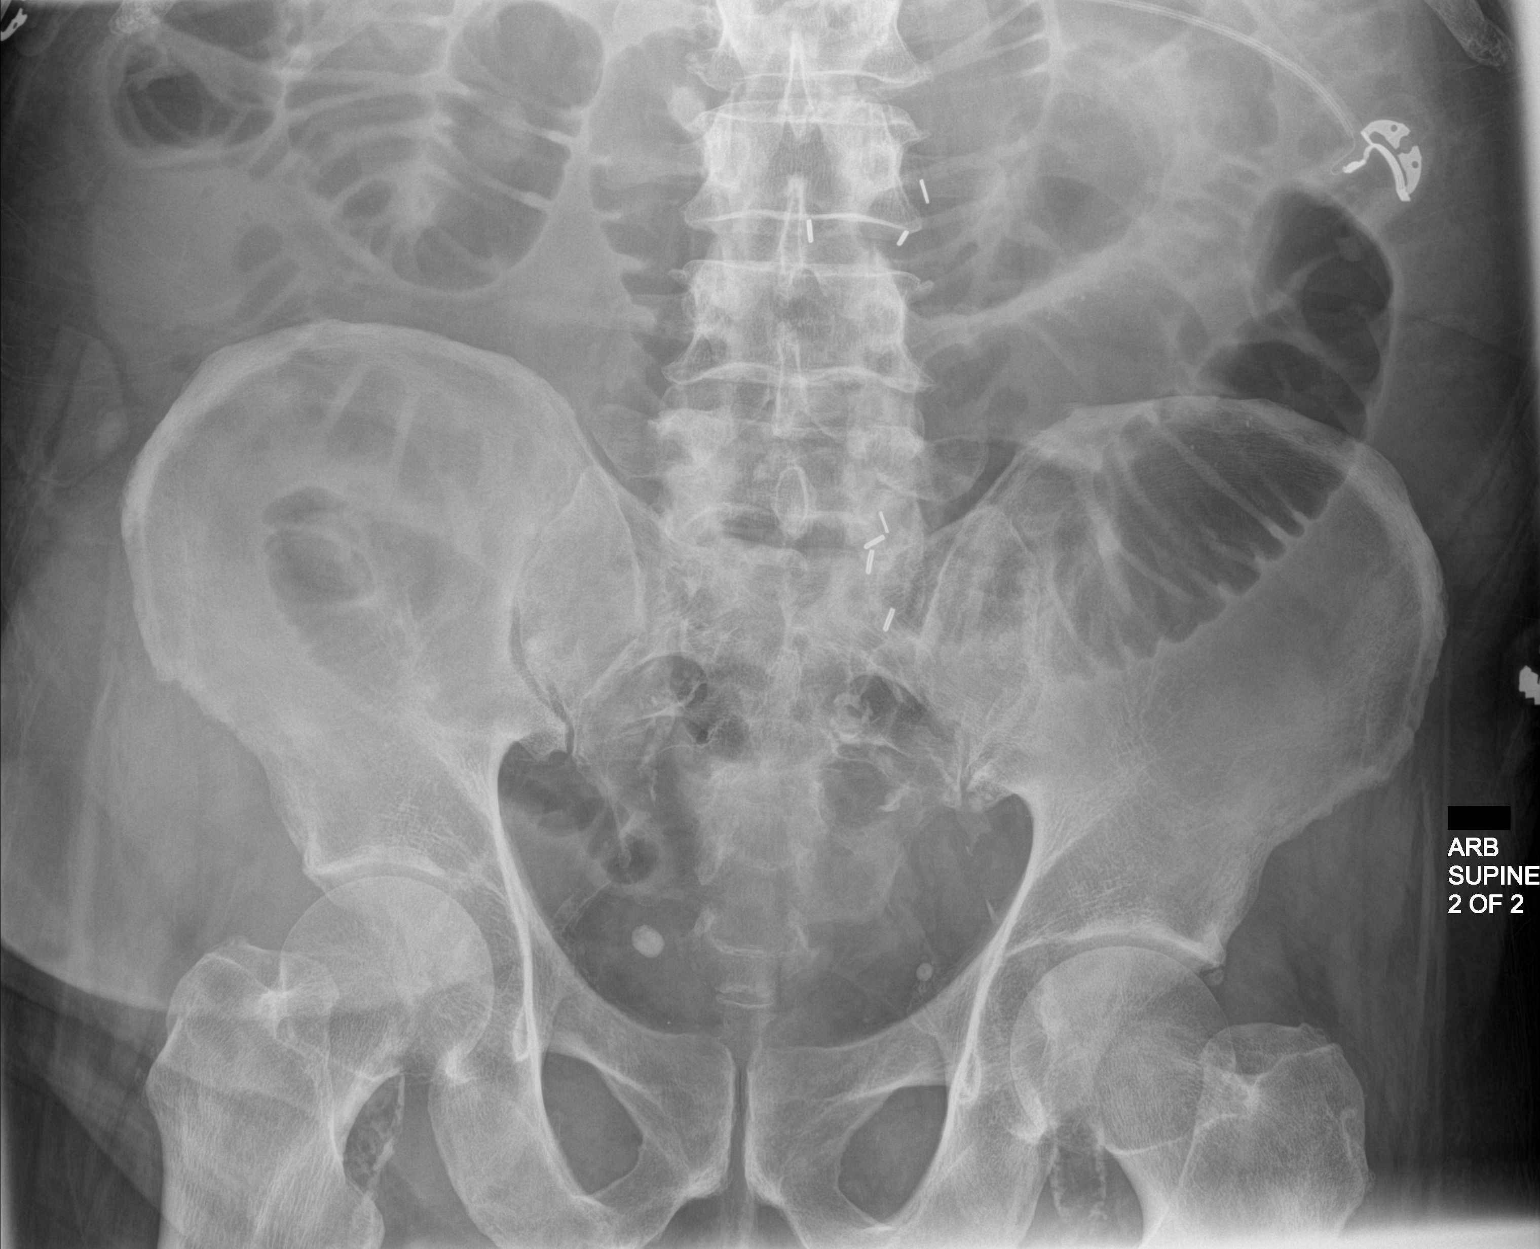

[3 of 3 positions shown; findings below may reference images not displayed]

FINDINGS: Enteric tube projects over the stomach, unchanged. Multiple mildly
dilated loops of small bowel remain throughout the abdomen, similar
to the prior studies. No significant colonic gas is identified. No
intraperitoneal free air identified. Abdominal surgical clips and
vascular calcifications are noted. Visualized lung bases are grossly
clear.
IMPRESSION: Persistent small bowel dilatation consistent with obstruction.

## 2017-11-26 IMAGING — CR DG ABDOMEN 2V
1 series · 3 of 3 positions shown · non-contrast
Comparison: 09/06/2015

CLINICAL DATA: Abdominal pain, distention. History of small bowel
obstruction.

EXAM:
ABDOMEN - 2 VIEW

[Series 1: dg abd 2 views · 0.14mm/px · 3 of 3 slices shown]
[im 1/3]
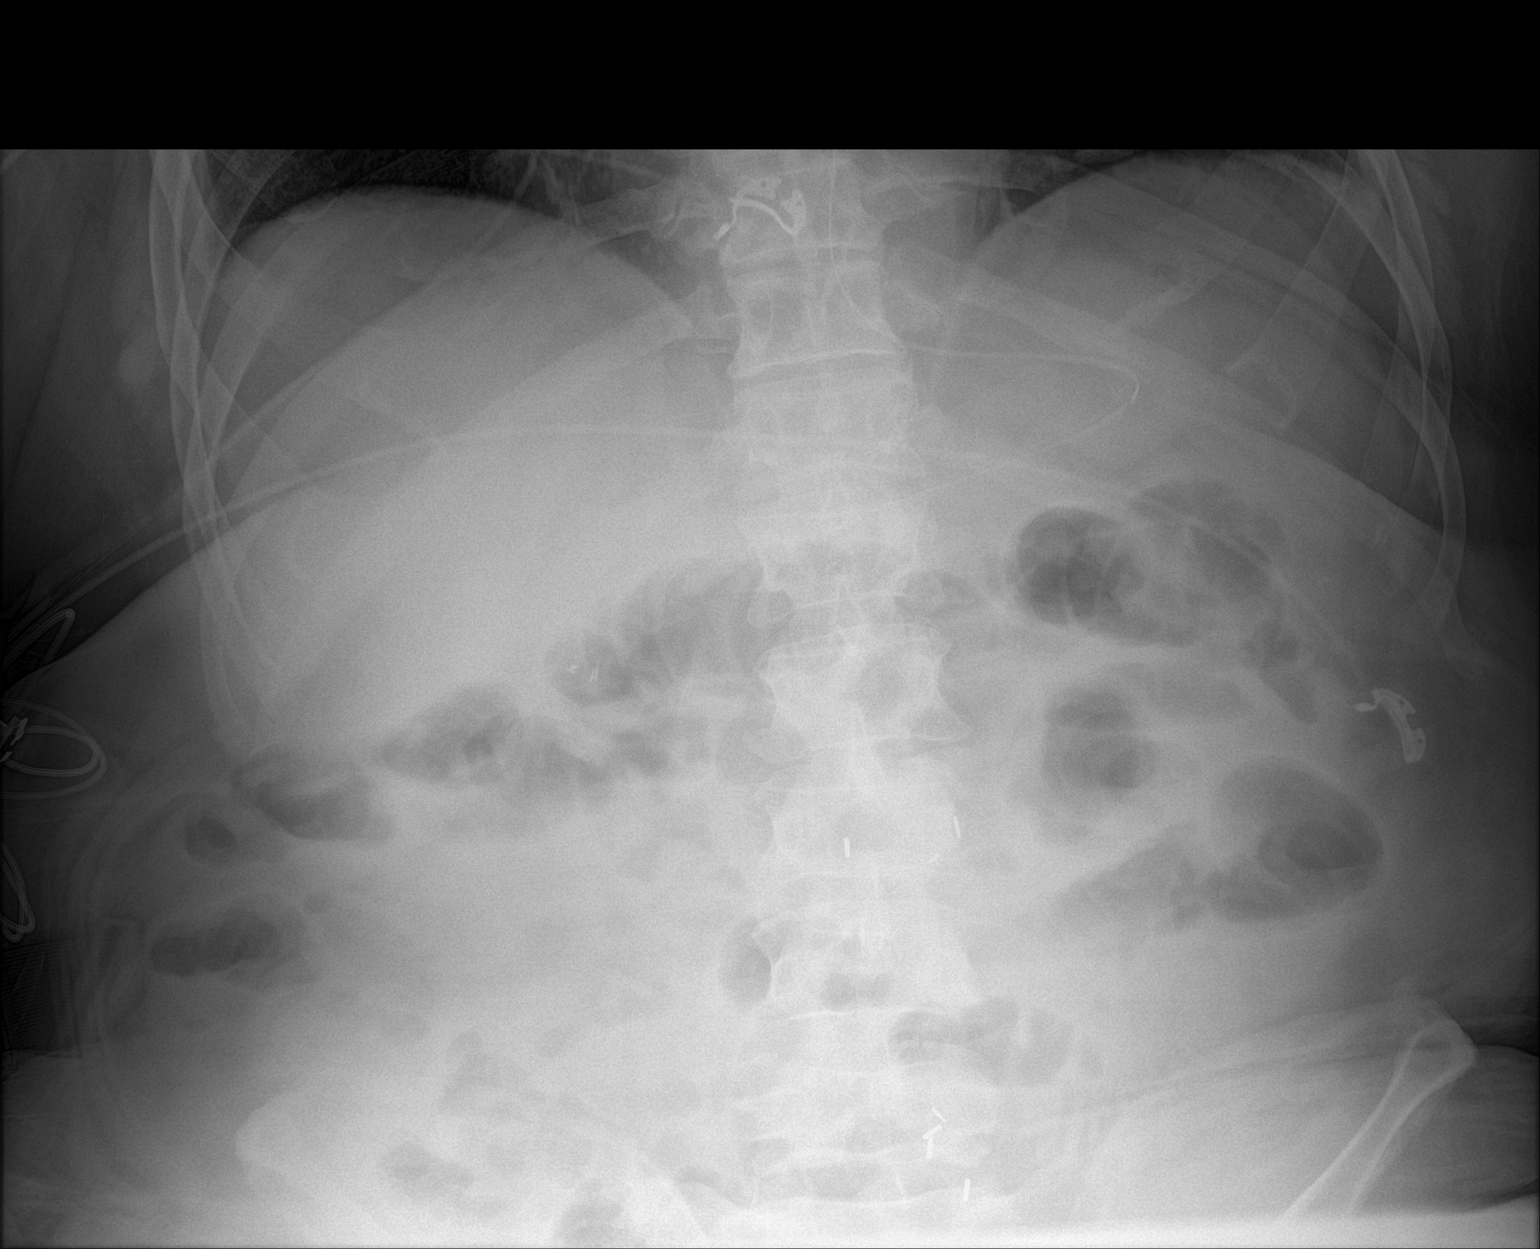
[im 2/3]
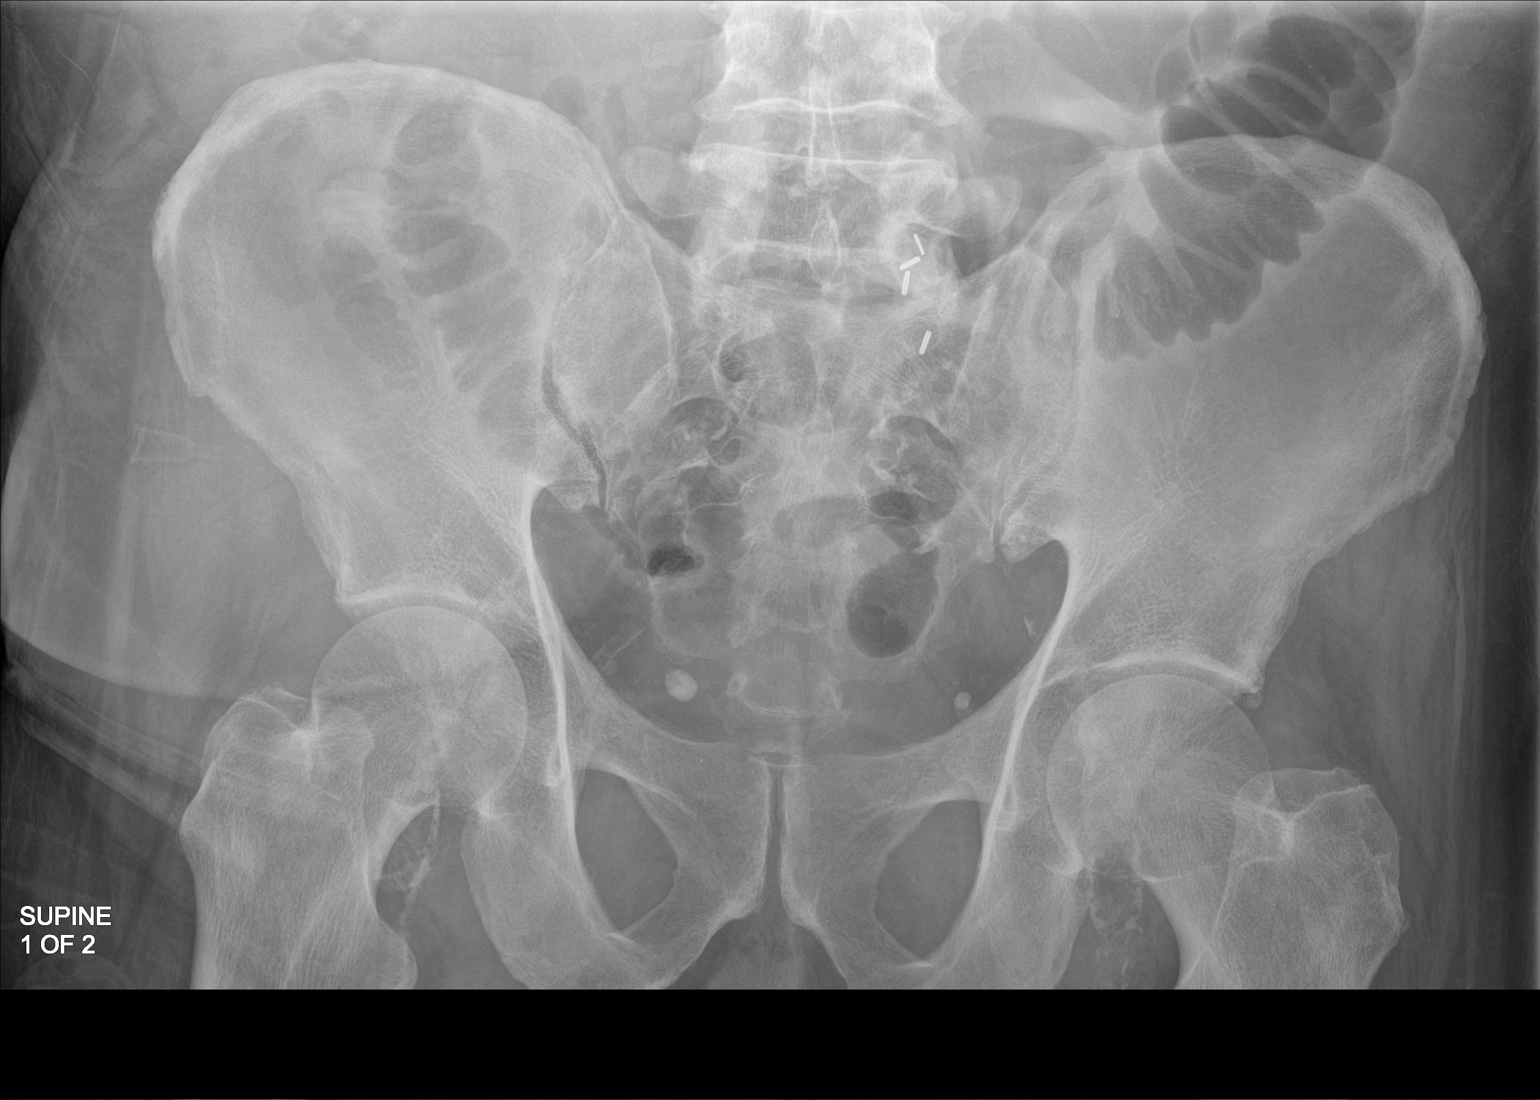
[im 3/3]
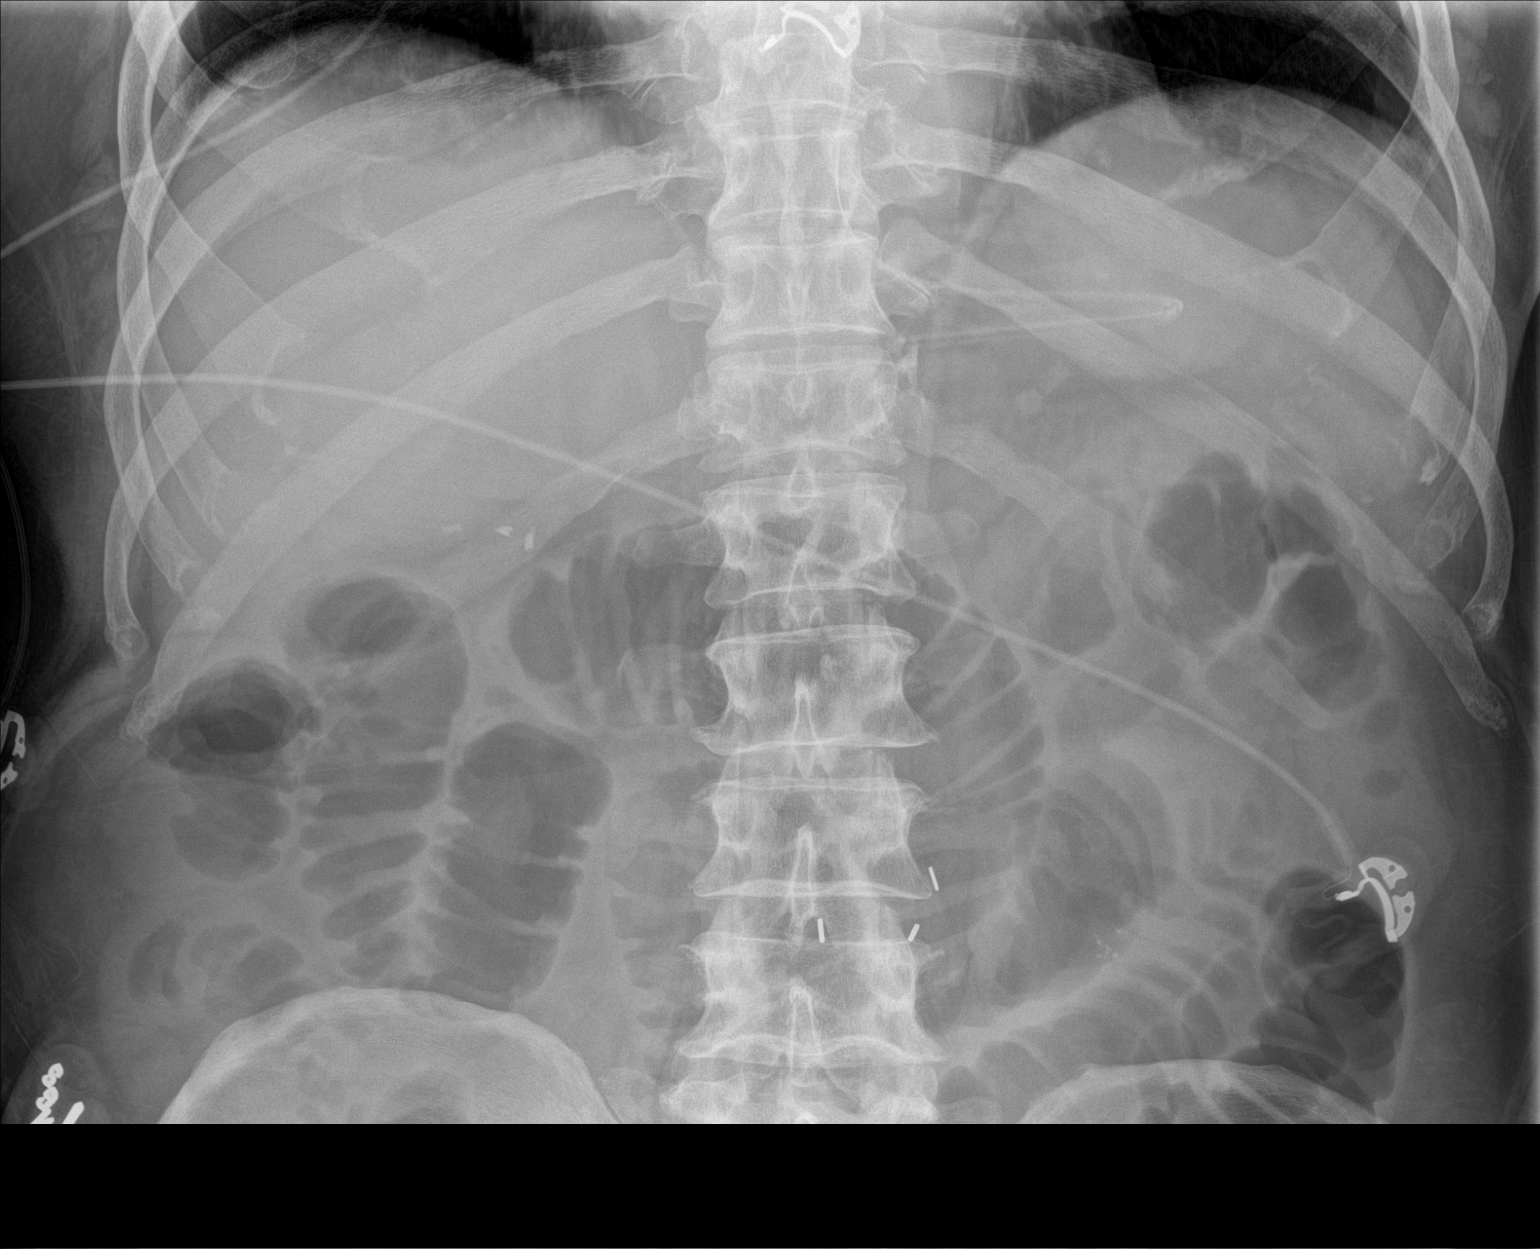

[3 of 3 positions shown; findings below may reference images not displayed]

FINDINGS: NG tube remains present in the stomach. Continued dilated small
bowel loops in the abdomen and pelvis, not significantly changed
since prior study compatible with small bowel obstruction. No free
air organomegaly.
IMPRESSION: Continued small bowel obstruction pattern.
